# Patient Record
Sex: Female | Born: 1950
Health system: Southern US, Community
[De-identification: ages and names within clinical notes are randomized; demographics above are authoritative.]

## PROBLEM LIST (undated history)

## (undated) DIAGNOSIS — R7989 Other specified abnormal findings of blood chemistry: Secondary | ICD-10-CM

## (undated) DIAGNOSIS — H5200 Hypermetropia, unspecified eye: Secondary | ICD-10-CM

## (undated) DIAGNOSIS — F419 Anxiety disorder, unspecified: Secondary | ICD-10-CM

## (undated) DIAGNOSIS — N390 Urinary tract infection, site not specified: Secondary | ICD-10-CM

## (undated) HISTORY — DX: Other specified abnormal findings of blood chemistry: R79.89

## (undated) HISTORY — DX: Hypermetropia, unspecified eye: H52.00

## (undated) HISTORY — PX: PARTIAL HYSTERECTOMY: SHX80

## (undated) HISTORY — PX: WISDOM TOOTH EXTRACTION: SHX21

## (undated) HISTORY — PX: CARPAL TUNNEL RELEASE: SHX101

## (undated) HISTORY — PX: BREAST REDUCTION SURGERY: SHX8

## (undated) HISTORY — DX: Urinary tract infection, site not specified: N39.0

## (undated) NOTE — *Deleted (*Deleted)
Patient ID: NOVALI VOLLMAN, female   DOB: [birth year], 61 y.o.   MRN: [id number]   This NP visited patient at the bedside as a follow up for palliativ emedicn needs and emotional support  Spoke to husband on telephione for continued conversation regarding     anticiaptroy care needs.        Discussed with patient the importance of continued conversation with family and their  medical providers regarding overall plan of care and treatment options,  ensuring decisions are within the context of the patients values and GOCs.  Questions and concerns addressed   Discussed with Dr Riley Kill and Aria/LCSW vis secure chat  Total time spent on the unit was   Greater than 50% of the time was spent in counseling and coordination of care  Lorinda Creed NP  Palliative Medicine Team Team Phone # [phone] Pager [phone]

---

## 1997-12-07 ENCOUNTER — Other Ambulatory Visit: Admission: RE | Admit: 1997-12-07 | Discharge: 1997-12-07 | Payer: Self-pay | Admitting: Plastic Surgery

## 2001-08-20 ENCOUNTER — Ambulatory Visit (HOSPITAL_BASED_OUTPATIENT_CLINIC_OR_DEPARTMENT_OTHER): Admission: RE | Admit: 2001-08-20 | Discharge: 2001-08-20 | Payer: Self-pay | Admitting: Plastic Surgery

## 2002-08-19 ENCOUNTER — Other Ambulatory Visit: Admission: RE | Admit: 2002-08-19 | Discharge: 2002-08-19 | Payer: Self-pay | Admitting: Gynecology

## 2004-05-09 ENCOUNTER — Other Ambulatory Visit: Admission: RE | Admit: 2004-05-09 | Discharge: 2004-05-09 | Payer: Self-pay | Admitting: Gynecology

## 2005-05-15 ENCOUNTER — Other Ambulatory Visit: Admission: RE | Admit: 2005-05-15 | Discharge: 2005-05-15 | Payer: Self-pay | Admitting: Gynecology

## 2006-09-10 ENCOUNTER — Other Ambulatory Visit: Admission: RE | Admit: 2006-09-10 | Discharge: 2006-09-10 | Payer: Self-pay | Admitting: Gynecology

## 2011-03-25 ENCOUNTER — Emergency Department (HOSPITAL_COMMUNITY): Payer: 59

## 2011-03-25 ENCOUNTER — Emergency Department (HOSPITAL_COMMUNITY)
Admission: EM | Admit: 2011-03-25 | Discharge: 2011-03-25 | Disposition: A | Discharge status: Home / Self Care | Payer: 59 | Attending: Emergency Medicine | Admitting: Emergency Medicine

## 2011-03-25 ENCOUNTER — Encounter (HOSPITAL_COMMUNITY): Payer: Self-pay | Admitting: *Deleted

## 2011-03-25 DIAGNOSIS — W010XXA Fall on same level from slipping, tripping and stumbling without subsequent striking against object, initial encounter: Secondary | ICD-10-CM | POA: Insufficient documentation

## 2011-03-25 DIAGNOSIS — R071 Chest pain on breathing: Secondary | ICD-10-CM | POA: Insufficient documentation

## 2011-03-25 DIAGNOSIS — R259 Unspecified abnormal involuntary movements: Secondary | ICD-10-CM | POA: Insufficient documentation

## 2011-03-25 DIAGNOSIS — S2239XA Fracture of one rib, unspecified side, initial encounter for closed fracture: Secondary | ICD-10-CM | POA: Insufficient documentation

## 2011-03-25 DIAGNOSIS — N390 Urinary tract infection, site not specified: Secondary | ICD-10-CM

## 2011-03-25 DIAGNOSIS — M25559 Pain in unspecified hip: Secondary | ICD-10-CM | POA: Insufficient documentation

## 2011-03-25 DIAGNOSIS — R109 Unspecified abdominal pain: Secondary | ICD-10-CM | POA: Insufficient documentation

## 2011-03-25 DIAGNOSIS — M545 Low back pain, unspecified: Secondary | ICD-10-CM | POA: Insufficient documentation

## 2011-03-25 DIAGNOSIS — S301XXA Contusion of abdominal wall, initial encounter: Secondary | ICD-10-CM | POA: Insufficient documentation

## 2011-03-25 DIAGNOSIS — Y93K9 Activity, other involving animal care: Secondary | ICD-10-CM | POA: Insufficient documentation

## 2011-03-25 LAB — URINALYSIS, ROUTINE W REFLEX MICROSCOPIC
Bilirubin Urine: NEGATIVE
Nitrite: NEGATIVE
Specific Gravity, Urine: 1.023 (ref 1.005–1.030)
Urobilinogen, UA: 0.2 mg/dL (ref 0.0–1.0)
pH: 6 (ref 5.0–8.0)

## 2011-03-25 LAB — CBC
HCT: 41.6 % (ref 36.0–46.0)
MCH: 33 pg (ref 26.0–34.0)
MCV: 93.5 fL (ref 78.0–100.0)
Platelets: 248 10*3/uL (ref 150–400)
RBC: 4.45 MIL/uL (ref 3.87–5.11)
RDW: 13 % (ref 11.5–15.5)
WBC: 10.1 10*3/uL (ref 4.0–10.5)

## 2011-03-25 LAB — URINE MICROSCOPIC-ADD ON

## 2011-03-25 MED ORDER — LORAZEPAM 1 MG PO TABS
1.0000 mg | ORAL_TABLET | Freq: Once | ORAL | Status: AC
  Administered 2011-03-25: 1 mg via ORAL

## 2011-03-25 MED ORDER — HYDROCODONE-ACETAMINOPHEN 5-500 MG PO TABS: 1.0000 | ORAL_TABLET | Freq: Four times a day (QID) | ORAL | Status: AC | PRN

## 2011-03-25 MED ORDER — LORAZEPAM 1 MG PO TABS
ORAL_TABLET | ORAL | Status: DC
Start: 2011-03-25 — End: 2011-03-25
  Filled 2011-03-25: qty 1

## 2011-03-25 MED ORDER — LORAZEPAM 1 MG PO TABS: 1.0000 mg | ORAL_TABLET | Freq: Once | ORAL | Status: DC

## 2011-03-25 MED ORDER — CIPROFLOXACIN HCL 500 MG PO TABS: 500.0000 mg | ORAL_TABLET | Freq: Two times a day (BID) | ORAL | Status: AC

## 2011-03-25 MED ORDER — NAPROXEN 500 MG PO TABS: 500.0000 mg | ORAL_TABLET | Freq: Two times a day (BID) | ORAL | Status: DC

## 2011-03-25 NOTE — ED Notes (Signed)
Discharge instructions given to pt and explained, escorted to discharge window.

## 2011-03-25 NOTE — ED Provider Notes (Signed)
History     CSN: [id number]  Arrival date & time 03/25/11  0543   First MD Initiated Contact with Patient 03/25/11 [phone]      Chief Complaint  Patient presents with  . Fall  . Hip Pain    (Consider location/radiation/quality/duration/timing/severity/associated sxs/prior treatment) HPI Comments: 61 y/o female with hx of no sig PMH - presents with a complaint of right flank. She states that she fell several days ago when she tripped over her dog landing on her right side including the hip and ribs, noticed yesterday starting to have pain in her right lower back and flank.  The pain is constant, worse with palpation and movement, not associated with shortness of breath cough or fever. There is some bruising which has formed on her right flank area. She denies using any anticoagulant therapy.  The history is provided by the patient and the spouse.    History reviewed. No pertinent past medical history.  Past Surgical History  Procedure Date  . Abdominal hysterectomy   . Carpal tunnel release   . Breast reduction surgery     History reviewed. No pertinent family history.  History  Substance Use Topics  . Smoking status: Never Smoker   . Smokeless tobacco: Not on file  . Alcohol Use: Yes     daily    OB History    Grav Para Term Preterm Abortions TAB SAB Ect Mult Living                  Review of Systems  All other systems reviewed and are negative.    Allergies  Review of patient's allergies indicates no known allergies.  Home Medications   Current Outpatient Rx  Name Route Sig Dispense Refill  . CIPROFLOXACIN HCL 500 MG PO TABS Oral Take 1 tablet (500 mg total) by mouth 2 (two) times daily. 14 tablet 0  . HYDROCODONE-ACETAMINOPHEN 5-500 MG PO TABS Oral Take 1-2 tablets by mouth every 6 (six) hours as needed for pain. 15 tablet 0  . NAPROXEN 500 MG PO TABS Oral Take 1 tablet (500 mg total) by mouth 2 (two) times daily with a meal. 30 tablet 0    BP 126/73   Pulse 83  Temp(Src) 99.4 F (37.4 C) (Oral)  Resp 16  Ht 5\' 8"  (1.727 m)  Wt 150 lb (68.04 kg)  BMI 22.81 kg/m2  SpO2 94%  Physical Exam  Nursing note and vitals reviewed. Constitutional: She appears well-developed and well-nourished. No distress.  HENT:  Head: Normocephalic and atraumatic.  Mouth/Throat: Oropharynx is clear and moist. No oropharyngeal exudate.  Eyes: Conjunctivae and EOM are normal. Pupils are equal, round, and reactive to light. Right eye exhibits no discharge. Left eye exhibits no discharge. No scleral icterus.  Neck: Normal range of motion. Neck supple. No JVD present. No thyromegaly present.  Cardiovascular: Normal rate, regular rhythm, normal heart sounds and intact distal pulses.  Exam reveals no gallop and no friction rub.   No murmur heard. Pulmonary/Chest: Effort normal and breath sounds normal. No respiratory distress. She has no wheezes. She has no rales. She exhibits tenderness ( Tenderness to the right chest wall, no crepitance feel).  Abdominal: Soft. Bowel sounds are normal. She exhibits no distension and no mass. There is no tenderness.       Right CVA tenderness associated with bruising  Musculoskeletal: Normal range of motion. She exhibits no edema and no tenderness.       No pain with range of  motion of the knees hips ankles shoulders elbows or wrist bilaterally, normal strength in the bilateral legs and arms major muscle group  Lymphadenopathy:    She has no cervical adenopathy.  Neurological: She is alert. Coordination normal.       Mild intermittent tremor, no intention tremor, follows commands, clear speech, normal gait  Skin: Skin is warm and dry.       Bruising to the right flank  Psychiatric: She has a normal mood and affect. Her behavior is normal.    ED Course  Procedures (including critical care time)  Labs Reviewed  URINALYSIS, ROUTINE W REFLEX MICROSCOPIC - Abnormal; Notable for the following:    APPearance CLOUDY (*)    Glucose,  UA 100 (*)    Ketones, ur TRACE (*)    Leukocytes, UA LARGE (*)    All other components within normal limits  URINE MICROSCOPIC-ADD ON - Abnormal; Notable for the following:    Squamous Epithelial / LPF FEW (*)    Bacteria, UA MANY (*)    All other components within normal limits  CBC  URINE CULTURE   Dg Ribs Unilateral W/chest Right  03/25/2011  *RADIOLOGY REPORT*  Clinical Data: Status post fall.  Right-sided lower rib pain.  RIGHT RIBS AND CHEST - 3+ VIEW  Comparison: Pelvis radiograph 03/25/2011  Findings: Normal heart, mediastinal, hilar contours.  Trachea is midline.  The lungs are clear. Negative for pneumothorax or pleural effusion.  Question a very subtle lucency in the lateral arc of the right eighth rib.  Cannot exclude a subtle nondisplaced fracture.  This is not definite.  Otherwise, the ribs are unremarkable.  IMPRESSION:  1.  Possible subtle acute nondisplaced fracture of the lateral right eighth rib. 2.  No acute cardiopulmonary disease.  Original Report Authenticated By: Britta Mccreedy, M.D.   Dg Pelvis 1-2 Views  03/25/2011  *RADIOLOGY REPORT*  Clinical Data: Fall.  Right-sided pain.  PELVIS - 1-2 VIEW  Comparison: None.  Findings: Decreased bony mineralization.  No acute fracture identified.  Convex left scoliosis of the visualized lower lumbar spine.  Sacroiliac joints and pubic symphysis are normal.  IMPRESSION: No acute bony abnormality.  If the patient has a right hip pain, consider dedicated right hip radiographs.  Original Report Authenticated By: Britta Mccreedy, M.D.     1. Rib fracture   2. Urinary tract infection       MDM  No bony tenderness other than ribs, x-rays pending, patient declines pain medication. 1 mg of Ativan by mouth given for patient's anxiety and shakiness. She has been ambulatory despite the fall with minimal difficulty for 2 days and denies any hematuria, check CBC, urinalysis for microscopic hematuria, damage  Definitive Fracture  Care:  Definitive fracture care performred for the rib.  This included analgesia in the ED (pt declined), incentive spirometry,which have been provided.  I have counseled the pt on possible complications of the fractures and signs and symptoms which would mandate return for further care.    Ciprofloxacin given for urinary infection by prescription, patient well appearing with normal vital signs on discharge  Discharge Prescriptions include:  #1 ciprofloxacin  #2 Vicodin  #3 Naprosyn  Vida Roller, MD 03/25/11 0730

## 2011-03-25 NOTE — ED Notes (Signed)
Pt fell, injuring R hip. Pt states tripped over the dog. Pt states pain in R hip started on Saturday. Pt is shaky and lightheaded. Pt c/o a lump and large hematoma on R hip. Pt states her R hip "catches" when she tries to stand from a sitting position.

## 2011-03-26 LAB — URINE CULTURE: Culture  Setup Time: 201302171110

## 2011-03-26 NOTE — ED Notes (Signed)
Pt called this am needing medication for nausea . Chart reviewed by PA Lawyer. He ordered phenergan 25mg  1 PO every 6 hours as needed for nausea. Disp 12, no refills. Medication called to [facility] in [place] [phone]

## 2011-08-21 ENCOUNTER — Ambulatory Visit (INDEPENDENT_AMBULATORY_CARE_PROVIDER_SITE_OTHER): Payer: 59 | Admitting: Medical

## 2011-08-21 ENCOUNTER — Telehealth: Payer: Self-pay | Admitting: Family Medicine

## 2011-08-21 ENCOUNTER — Encounter: Payer: Self-pay | Admitting: Medical

## 2011-08-21 VITALS — BP 120/80 | HR 76 | Temp 98.4°F | Resp 16 | Ht 67.0 in | Wt 155.0 lb

## 2011-08-21 DIAGNOSIS — Z23 Encounter for immunization: Secondary | ICD-10-CM

## 2011-08-21 DIAGNOSIS — M79609 Pain in unspecified limb: Secondary | ICD-10-CM

## 2011-08-21 DIAGNOSIS — Z1239 Encounter for other screening for malignant neoplasm of breast: Secondary | ICD-10-CM

## 2011-08-21 DIAGNOSIS — Z0000 Encounter for general adult medical examination without abnormal findings: Secondary | ICD-10-CM

## 2011-08-21 DIAGNOSIS — Z1211 Encounter for screening for malignant neoplasm of colon: Secondary | ICD-10-CM

## 2011-08-21 DIAGNOSIS — D492 Neoplasm of unspecified behavior of bone, soft tissue, and skin: Secondary | ICD-10-CM

## 2011-08-21 DIAGNOSIS — M79672 Pain in left foot: Secondary | ICD-10-CM

## 2011-08-21 LAB — LIPID PANEL
HDL: 127 mg/dL (ref >39)
LDL Cholesterol: 136 mg/dL — ABNORMAL HIGH (ref 0–99)
Total CHOL/HDL Ratio: 2.2 Ratio
Triglycerides: 60 mg/dL (ref <150)

## 2011-08-21 LAB — CBC
HCT: 48.3 % — ABNORMAL HIGH (ref 36.0–46.0)
Hemoglobin: 16.2 g/dL — ABNORMAL HIGH (ref 12.0–15.0)
RBC: 4.9 MIL/uL (ref 3.87–5.11)
RDW: 14.1 % (ref 11.5–15.5)
WBC: 6.9 10*3/uL (ref 4.0–10.5)

## 2011-08-21 LAB — POCT URINALYSIS DIPSTICK
Bilirubin, UA: NEGATIVE
Blood, UA: NEGATIVE
Ketones, UA: NEGATIVE
Protein, UA: NEGATIVE
Spec Grav, UA: <=1.005
pH, UA: 7

## 2011-08-21 LAB — COMPREHENSIVE METABOLIC PANEL
ALT: 13 U/L (ref 0–35)
BUN: 15 mg/dL (ref 6–23)
CO2: 29 mEq/L (ref 19–32)
Calcium: 10 mg/dL (ref 8.4–10.5)
Chloride: 102 mEq/L (ref 96–112)
Creat: 0.71 mg/dL (ref 0.50–1.10)
Glucose, Bld: 97 mg/dL (ref 70–99)
Total Bilirubin: 1 mg/dL (ref 0.3–1.2)

## 2011-08-21 NOTE — Telephone Encounter (Signed)
Patient was made aware of her 2 appointments at her office visit on 08/21/11. CLS She was given copies with all of the appointment information on them. CLS   [facility] [phone] July 23,13 @ 130 pm  Dr. Elnoria Howard [phone] July 24, 13 @ 1015 am

## 2011-08-21 NOTE — Progress Notes (Signed)
Subjective:   HPI  Hannah Padilla is a 61 y.o. female who presents for a complete physical.  She is a new patient today.  Referred by Randie Heinz.   Was seeing Urgent Care prior for acute issues.    Preventative care: Last ophthalmology visit: 15 years ago Last dental visit: 5 years ago Last tetanus booster: 3 years ago Flu vaccine: declines Colonoscopy - never Mammogram 1.5 years ago  Concerned about a bladder infection.  Has had 2 bad ones this past year without typical symptoms.  Not sure why.  She gets bad breath occasionally and this was going on with the prior UTI.   She wonders if they are related.   She notes foot problems.  It is painful to stand on her feet all day.  Feels like she is walking on cotton on the balls of the foot. Getting some numbness in left great toe.    Has mole on shoulder she wants checked.  Has doubled in size in the last 29mo.    Reviewed their medical, surgical, family, social, medication, and allergy history and updated chart as appropriate.  Past Medical History  Diagnosis Date  . Urinary tract infection   . Farsightedness     wears glasses    Past Surgical History  Procedure Date  . Carpal tunnel release   . Breast reduction surgery   . Wisdom tooth extraction   . Partial hysterectomy     still has ovaries    Family History  Problem Relation Age of Onset  . Alzheimer's disease Mother   . Heart disease Neg Hx   . Diabetes Neg Hx   . Cancer Neg Hx   . Stroke Neg Hx   . Hypertension Neg Hx   . Hyperlipidemia Neg Hx     History   Social History  . Marital Status: Married    Spouse Name: N/A    Number of Children: N/A  . Years of Education: N/A   Occupational History  . Not on file.   Social History Main Topics  . Smoking status: Never Smoker   . Smokeless tobacco: Not on file  . Alcohol Use: 8.4 oz/week    7 Glasses of wine, 7 Shots of liquor per week     daily  . Drug Use: No  . Sexually Active: Yes    Birth Control/  Protection: None   Other Topics Concern  . Not on file   Social History Narrative   Married, no children    No current outpatient prescriptions on file prior to visit.    No Known Allergies   Review of Systems Constitutional: -fever, -chills, -sweats, -unexpected weight change, -anorexia, -fatigue Allergy: -sneezing, -itching, -congestion Dermatology: + changing moles, rash, lumps, new worrisome lesions ENT: -runny nose, -ear pain, -sore throat, -hoarseness, -sinus pain, -teeth pain, -tinnitus, -hearing loss, -epistaxis Cardiology:  -chest pain, -palpitations, -edema, -orthopnea, -paroxysmal nocturnal dyspnea Respiratory: -cough, -shortness of breath, -dyspnea on exertion, -wheezing, -hemoptysis Gastroenterology: -abdominal pain, -nausea, -vomiting, -diarrhea, -constipation, -blood in stool, -changes in bowel movement, -dysphagia Hematology: -bleeding or bruising problems Musculoskeletal: -arthralgias, -myalgias, -joint swelling, -back pain, -neck pain, -cramping, -gait changes Ophthalmology: -vision changes, -eye redness, -itching, -discharge Urology: -dysuria, -difficulty urinating, -hematuria, -urinary frequency, -urgency, incontinence Neurology: -headache, -weakness, -tingling, +numbness, -speech abnormality, -memory loss, -falls, -dizziness Psychology:  -depressed mood, -agitation, -sleep problems         Objective:   Physical Exam  Filed Vitals:   08/21/11 0824  BP:  120/80  Pulse: 76  Temp: 98.4 F (36.9 C)  Resp: 16    General appearance: alert, no distress, WD/WN, white female Skin: left anterior shoulder with somewhat trapezoid shaped lesion, 12mm long x 6mm wide, slightly raised, mostly red brown, somewhat asymmetric but borders defined, several other smaller benign lesions of arms, chest, abdomen and back HEENT: normocephalic, conjunctiva/corneas normal, sclerae anicteric, PERRLA, EOMi, nares patent, no discharge or erythema, pharynx normal Oral cavity: MMM,  tongue normal, teeth in good repair Neck: supple, no lymphadenopathy, no thyromegaly, no masses, normal ROM, no bruits Chest: non tender, normal shape and expansion Heart: RRR, normal S1, S2, no murmurs Lungs: CTA bilaterally, no wheezes, rhonchi, or rales Abdomen: +bs, soft, non tender, non distended, no masses, no hepatomegaly, no splenomegaly, no bruits Back: non tender, normal ROM, no scoliosis Musculoskeletal: upper extremities non tender, no obvious deformity, normal ROM throughout, lower extremities non tender, no obvious deformity, normal ROM throughout Extremities: no edema, no cyanosis, no clubbing Pulses: 2+ symmetric, upper and lower extremities, normal cap refill Neurological: alert, oriented x 3, CN2-12 intact, strength normal upper extremities and lower extremities, sensation normal throughout, DTRs 2+ throughout, no cerebellar signs, gait normal Psychiatric: normal affect, behavior normal, pleasant  Breast: nontender, no masses or lumps, no skin changes, no nipple discharge or inversion, no axillary lymphadenopathy Gyn: Normal external genitalia without lesions, vagina with normal mucosa, s/p hysterectomy, mild white creamy vaginal discharge.  Adnexa not enlarged, nontender, no masses.  Exam chaperoned by nurse. Rectal: anus normal appearing, DRE deferred as she will be referred to GI    Assessment and Plan :    Encounter Diagnoses  Name Primary?  . Routine general medical examination at a health care facility Yes  . Screening for colon cancer   . Screening for breast cancer   . Neoplasm of skin   . Need for zoster vaccination   . Pain in both feet     Physical exam - discussed healthy lifestyle, diet, exercise, preventative care, vaccinations, and addressed their concerns.  Handout given.  Referral to Dr. Elnoria Howard for first screening colonoscopy.  Referral for mammogram.  Neoplasm of skin - return soon for punch biopsy of left anterior shoulder lesion  Script for  shingles vaccine today.  Pain in feet - try Dr. Luiz Ochoa orthotics or different footwear.  If this doesn't help, recheck.   Follow-up pending studies.

## 2011-08-21 NOTE — Patient Instructions (Signed)

## 2011-08-22 ENCOUNTER — Other Ambulatory Visit: Payer: Self-pay | Admitting: Medical

## 2011-08-22 LAB — WET PREP, GENITAL: Trich, Wet Prep: NONE SEEN

## 2011-08-22 MED ORDER — ERGOCALCIFEROL 1.25 MG (50000 UT) PO CAPS: 50000.0000 [IU] | ORAL_CAPSULE | ORAL | Status: AC

## 2011-08-29 ENCOUNTER — Telehealth: Payer: Self-pay | Admitting: Internal Medicine

## 2011-08-29 NOTE — Telephone Encounter (Signed)
Message copied by Joslyn Hy on Wed Aug 29, 2011  2:25 PM ------      Message from: Aleen Campi, DAVID S      Created: Wed Aug 29, 2011  2:12 PM       Mammogram negative.  Repeat 30yr

## 2011-09-03 ENCOUNTER — Ambulatory Visit: Payer: 59 | Admitting: Medical

## 2011-09-05 ENCOUNTER — Encounter: Payer: Self-pay | Admitting: Medical

## 2011-09-05 ENCOUNTER — Ambulatory Visit (INDEPENDENT_AMBULATORY_CARE_PROVIDER_SITE_OTHER): Payer: 59 | Admitting: Medical

## 2011-09-05 VITALS — BP 130/80 | HR 75 | Temp 98.2°F | Resp 14 | Wt 153.0 lb

## 2011-09-05 DIAGNOSIS — D492 Neoplasm of unspecified behavior of bone, soft tissue, and skin: Secondary | ICD-10-CM

## 2011-09-05 HISTORY — PX: OTHER SURGICAL HISTORY: SHX169

## 2011-09-05 NOTE — Progress Notes (Signed)
Subjective: Here for skin lesion excision only.  We had discussed this at her recently physical here.  She notes the left shoulder lesion has doubled in size in the last 26mo.  Otherwise no c/o.  She wishes to proceed with excision instead of punch biopsy.    Objective: Gen: wd, wn, nad Skin: left anterior shoulder region, medial to head of humerus, overlying lateral portion of supraspinatus with a somewhat rectangular lesion, 12mm long x 6mm wide, slightly raised, mostly red brown, somewhat asymmetric but borders defined  Assessment: Encounter Diagnosis  Name Primary?  . Neoplasm of skin of shoulder Yes   Plan: discussed risk/benefits of procedure, she gave consent, and we prepped for excisional biopsy.  Used 3 cc of 1% lidocaine with epi, cleaned and draped in normal sterile fashion with patient in supine position, used #15 blade to make an elliptical incision along lines of langerhans.  Completely excised the lesion and sent to pathology.  During this time I actually felt very lightheaded which is unusual for me as this was a routine biopsy.  i had nurse get supervising physician, Dr. Lynelle Doctor, who came in and completed the wound closure.  I left the room after Dr. Lynelle Doctor had come in and started closing the wound.  The wound was closed with 5 individual simple interrupted sutures, 5.0 nylon.  EBL <3cc.  Hemostasis achieved. Pt tolerated procedure well.  Covered wound with clean sterile dressing.  After I felt improved after an apparent vasovagal response, I came back in the room, apologized for the unusual circumstance, and discussed wound care and f/u with pt.  Pt was very understanding.  F/u in 10 days for suture removal.

## 2011-09-05 NOTE — Patient Instructions (Signed)
Keep the wound clean and dry.  Don't let it get wet for the next 24 hours at least.  After 24 hours, keep the area clean and dry.  You can use mild soap and water for cleaning around the wound, but then cover with clean dressing.  Take care to keep the area clean and return in 10 days to remove sutures.

## 2011-09-06 ENCOUNTER — Encounter: Payer: Self-pay | Admitting: Medical

## 2011-09-10 ENCOUNTER — Encounter: Payer: Self-pay | Admitting: Internal Medicine

## 2011-09-11 ENCOUNTER — Encounter: Payer: Self-pay | Admitting: Medical

## 2011-09-14 ENCOUNTER — Ambulatory Visit (INDEPENDENT_AMBULATORY_CARE_PROVIDER_SITE_OTHER): Payer: 59 | Admitting: Medical

## 2011-09-14 ENCOUNTER — Encounter: Payer: Self-pay | Admitting: Medical

## 2011-09-14 DIAGNOSIS — L821 Other seborrheic keratosis: Secondary | ICD-10-CM

## 2011-09-14 DIAGNOSIS — L851 Acquired keratosis [keratoderma] palmaris et plantaris: Secondary | ICD-10-CM

## 2011-09-14 DIAGNOSIS — Z4802 Encounter for removal of sutures: Secondary | ICD-10-CM

## 2011-09-14 NOTE — Progress Notes (Signed)
Subjective:  Here for suture removal.  I saw her 10 days ago to remove lesion from left shoulder region.   She has had some itching at the wound, but no warmth, drainage, fever, nausea or pain.  No c/o, doing well.  Objective: Left anterior medial shoulder with horizontal healing wound with pink granulation tissue and 5 Prolene interrupted sutures.  No warmth, drainage, or fluctuance.   Assessment: Encounter Diagnoses  Name Primary?  . Visit for suture removal Yes  . Lichenoid keratosis    Plan: Discussed pathology report, the benign nature of this lesion that was totally excised, and removed all 5 interrupted sutures in usual sterile fashion.  Placed 3 steri strips.  Discussed wound care.  F/u prn.

## 2011-11-26 ENCOUNTER — Ambulatory Visit (INDEPENDENT_AMBULATORY_CARE_PROVIDER_SITE_OTHER): Payer: 59 | Admitting: Medical

## 2011-11-26 ENCOUNTER — Encounter: Payer: Self-pay | Admitting: Medical

## 2011-11-26 VITALS — BP 110/82 | HR 110 | Temp 98.5°F | Resp 18 | Wt 152.0 lb

## 2011-11-26 DIAGNOSIS — K921 Melena: Secondary | ICD-10-CM

## 2011-11-26 DIAGNOSIS — R195 Other fecal abnormalities: Secondary | ICD-10-CM

## 2011-11-26 DIAGNOSIS — R450 Nervousness: Secondary | ICD-10-CM

## 2011-11-26 DIAGNOSIS — R110 Nausea: Secondary | ICD-10-CM

## 2011-11-26 DIAGNOSIS — R109 Unspecified abdominal pain: Secondary | ICD-10-CM

## 2011-11-26 DIAGNOSIS — R197 Diarrhea, unspecified: Secondary | ICD-10-CM

## 2011-11-26 LAB — CBC WITH DIFFERENTIAL/PLATELET
Basophils Absolute: 0.1 10*3/uL (ref 0.0–0.1)
HCT: 48.3 % — ABNORMAL HIGH (ref 36.0–46.0)
Hemoglobin: 17 g/dL — ABNORMAL HIGH (ref 12.0–15.0)
Lymphocytes Relative: 16 % (ref 12–46)
Lymphs Abs: 1.6 10*3/uL (ref 0.7–4.0)
Monocytes Absolute: 0.3 10*3/uL (ref 0.1–1.0)
Monocytes Relative: 3 % (ref 3–12)
Neutro Abs: 8 10*3/uL — ABNORMAL HIGH (ref 1.7–7.7)
RBC: 5.18 MIL/uL — ABNORMAL HIGH (ref 3.87–5.11)
WBC: 10 10*3/uL (ref 4.0–10.5)

## 2011-11-26 LAB — POCT URINALYSIS DIPSTICK
Glucose, UA: NEGATIVE
Ketones, UA: NEGATIVE
Protein, UA: NEGATIVE
Urobilinogen, UA: NEGATIVE

## 2011-11-26 LAB — TSH: TSH: 2.313 u[IU]/mL (ref 0.350–4.500)

## 2011-11-26 LAB — POCT HEMOGLOBIN: Hemoglobin: 17.1 g/dL — AB (ref 12.2–16.2)

## 2011-11-26 LAB — T4, FREE: Free T4: 1.5 ng/dL (ref 0.80–1.80)

## 2011-11-26 LAB — HEPATIC FUNCTION PANEL
Albumin: 4.7 g/dL (ref 3.5–5.2)
Total Bilirubin: 1.1 mg/dL (ref 0.3–1.2)

## 2011-11-26 LAB — BASIC METABOLIC PANEL
BUN: 15 mg/dL (ref 6–23)
Creat: 0.8 mg/dL (ref 0.50–1.10)
Potassium: 3.7 mEq/L (ref 3.5–5.3)

## 2011-11-26 NOTE — Progress Notes (Signed)
Subjective: Here for c/o episodes of nausea, nervousness, shakes, that started yesterday.  All last week she had diarrhea but this calmed down a bit, then over the weekend had some black tarry stool.  This morning was back to diarrhea.  She did use some Pepto Bismol over the weekend.  She had several of these episodes yesterday, and she would drink orange juice or V8 which seemed to calm her down.  This morning nothing seemed to calm down these symptoms.  Been having the nervousness, shakes all morning without relief.  Came in today as a walk in.  When she gets the symptoms gets a little short of breath.  She occasionally uses Ibuprofen, but not daily.  She denies hematemesis.  She notes hx/o gastric ulcers as a child.  Denies chest pain, edema, palpitations.  Just feels jittery and shaky in general.  denies any recent stressors that has caused anxiety or panic feeling.  No prior hx/o heart issues or GI bleed.   Past Medical History  Diagnosis Date  . Urinary tract infection   . Farsightedness     wears glasses   ROS as in HPI  Objective: Gen: anxious appearing, somewhat pale appearing, but no jaundice.   Skin: decent turgor.   HEENT: negative Oral: MMM, no lesions Neck: supple, no thyromegaly, no mass, no JVD Heart: RRR, somewhat pounding heart rate, but no murmur, normal S1, S2 Lungs: CTA GI: +decreased bs, soft, nontender, no mass or organomegaly Pulses: normal UE and LE Ext: no edema Rectal: small external hemorrhoids, no mass or lesion otherwise, occult negative stool, exam chaperoned by nurse    Adult ECG Report  Indication: jittery, SOB  Rate: 88bpm  Rhythm: normal sinus rhythm  QRS Axis: 69 degrees  PR Interval:  QRS Duration: 90ms  QTc:  Conduction Disturbances: none  Other Abnormalities: possible left atrial enlargement  Patient's cardiac risk factors are: none.  EKG comparison: none  Narrative Interpretation: some distorted baseline given patients  jitteriness, possible left atrial enlargement, but otherwise normal EKG  Assessment: Encounter Diagnoses  Name Primary?  . Diarrhea Yes  . Nausea   . Black stool   . Abdominal cramping   . Jittery    Reviewed EKG.  Hemoglobin 17.1g/dl.  +orthostatic hypotensive drop.  Occult negative stool.  Urinalysis unremarkable.   Sent stat labs for further evaluation.  For now advised rest, increase hydration with water and lytes, eat bland foods. Note for work.  discussed case with supervising physician Dr. Susann Givens and we will await lab results.

## 2011-11-27 ENCOUNTER — Other Ambulatory Visit: Payer: Self-pay | Admitting: Medical

## 2011-11-28 ENCOUNTER — Other Ambulatory Visit: Payer: 59

## 2011-11-29 LAB — SEDIMENTATION RATE: Sed Rate: 1 mm/hr (ref 0–22)

## 2011-11-29 LAB — PTH, INTACT AND CALCIUM: Calcium, Total (PTH): 9.8 mg/dL (ref 8.4–10.5)

## 2011-12-11 ENCOUNTER — Ambulatory Visit (INDEPENDENT_AMBULATORY_CARE_PROVIDER_SITE_OTHER): Payer: 59 | Admitting: Medical

## 2011-12-11 ENCOUNTER — Encounter: Payer: Self-pay | Admitting: Medical

## 2011-12-11 VITALS — BP 120/80 | HR 83 | Temp 98.4°F | Resp 18 | Wt 150.0 lb

## 2011-12-11 DIAGNOSIS — F411 Generalized anxiety disorder: Secondary | ICD-10-CM

## 2011-12-11 DIAGNOSIS — F419 Anxiety disorder, unspecified: Secondary | ICD-10-CM

## 2011-12-11 DIAGNOSIS — F4010 Social phobia, unspecified: Secondary | ICD-10-CM

## 2011-12-11 DIAGNOSIS — F40248 Other situational type phobia: Secondary | ICD-10-CM

## 2011-12-11 DIAGNOSIS — R110 Nausea: Secondary | ICD-10-CM

## 2011-12-11 DIAGNOSIS — R195 Other fecal abnormalities: Secondary | ICD-10-CM

## 2011-12-11 MED ORDER — ONDANSETRON HCL 4 MG PO TABS: 4.0000 mg | ORAL_TABLET | Freq: Three times a day (TID) | ORAL | Status: DC | PRN

## 2011-12-11 NOTE — Progress Notes (Signed)
Subjective: Here for recheck on "shakes."  After last visit, she notes by 11 am felt fine.  Her husband told me over the phone that she seems to get these episodes on Mondays.  She notes that she gets these episodes if she knows she is going to a party or event.   She will get shakes, nausea.   She is terrified of public speaking .  She doesn't want to be at the center of attention.  Took an F in an [place] class in high school because she refused to do an oral book report.  She doesn't necessarily have a hard time in big crowds.  Overall life is ok.   These episodes happen occasionally. She does note 2 panic attacks in her whole life, had shortness of breath, had to get away by herself.   She has never had evaluation for psychiatry or neurologic issue.  No prior treatment for anxiety or other mental health concerns.  She notes that work environment is good, enjoys working at [employer].  Home life is good, relationship with husband is good.  She denies any abuse or harm.  Financial situation is stable and better than in the past.  Denies depression.  Sleep is ok, usually gets 6 hours of sleep.  She has had no more dark stools that she saw last visit.  Overall doing ok.   Past Medical History  Diagnosis Date  . Urinary tract infection   . Farsightedness     wears glasses   ROS as in HPI  Objective: Gen: wd, wn, nad Skin: unremarkable  Neck: supple, no thyromegaly, no mass, no JVD Heart: RRR, somewhat pounding heart rate, but no murmur, normal S1, S2 Lungs: CTA Pulses: normal UE and LE Ext: no edema Neuro: CN2-12 intact, normal strength, sensation, DTRs, Romberg negative, heel to toe normal, finger to nose normal, no cerebellar signs.  Assessment: Encounter Diagnoses  Name Primary?  Marland Kitchen Anxiety Yes  . Nausea   . Fear of public speaking   . Dark stools    Plan: Discussed her symptoms, recent labs, and after careful history, it seems as though her symptoms of shakes and nausea seem more  anxiety related than anything else.   Discussed diagnosis of anxiety, ways to deal with anxiety, possible medications if needed.  Discussed deep breathing and relaxation.   She will use either Zofran or OTC anti nausea medication or Benadryl for episodes of nausea or possibly the shakes.   If symptoms worsen or get more frequent, she will let me know.  Based on eval so far, no major concern for underlying neurological issue.   Dark stools have resolved.  No major concern for GI bleed.

## 2011-12-11 NOTE — Patient Instructions (Signed)

## 2012-02-07 ENCOUNTER — Telehealth: Payer: Self-pay | Admitting: Medical

## 2012-02-07 NOTE — Telephone Encounter (Signed)
PT INFORMED TO TAKE 2 PILLS TO SEE IF THAT WOULD HELP AND CALL BACK TOMORROW TO LET SHANE KNOW IF THIS WORKED OR NOT

## 2012-02-07 NOTE — Telephone Encounter (Signed)
Have her double the dose of Zofran and call tomorrow

## 2012-02-07 NOTE — Telephone Encounter (Signed)
Pt called and stated that she saw shane and was given a rx for nausea. She was having issues with nausea so she took it today and it didn't help. She is requesting something else be called in for her. She took it at 70 and has had no relief. Pt uses [facility]. Please inform pt of what she needs now.

## 2012-02-08 NOTE — Telephone Encounter (Signed)
02/08/2012 °

## 2012-05-25 ENCOUNTER — Other Ambulatory Visit: Payer: Self-pay | Admitting: Medical

## 2012-05-26 NOTE — Telephone Encounter (Signed)
Is this okay to refill? 

## 2012-07-26 ENCOUNTER — Other Ambulatory Visit: Payer: Self-pay | Admitting: Medical

## 2013-02-05 DIAGNOSIS — K635 Polyp of colon: Secondary | ICD-10-CM

## 2013-02-05 HISTORY — DX: Polyp of colon: K63.5

## 2013-02-05 HISTORY — PX: COLONOSCOPY: SHX174

## 2013-05-13 ENCOUNTER — Ambulatory Visit (INDEPENDENT_AMBULATORY_CARE_PROVIDER_SITE_OTHER): Payer: 59 | Admitting: Medical

## 2013-05-13 ENCOUNTER — Telehealth: Payer: Self-pay | Admitting: Medical

## 2013-05-13 ENCOUNTER — Encounter: Payer: Self-pay | Admitting: Medical

## 2013-05-13 VITALS — BP 120/82 | HR 72 | Temp 98.1°F | Resp 16 | Wt 155.0 lb

## 2013-05-13 DIAGNOSIS — Z8719 Personal history of other diseases of the digestive system: Secondary | ICD-10-CM

## 2013-05-13 DIAGNOSIS — R1013 Epigastric pain: Secondary | ICD-10-CM

## 2013-05-13 DIAGNOSIS — F1010 Alcohol abuse, uncomplicated: Secondary | ICD-10-CM

## 2013-05-13 LAB — CBC
HEMATOCRIT: 46.8 % — AB (ref 36.0–46.0)
Hemoglobin: 16.3 g/dL — ABNORMAL HIGH (ref 12.0–15.0)
MCH: 32 pg (ref 26.0–34.0)
MCHC: 34.8 g/dL (ref 30.0–36.0)
MCV: 91.8 fL (ref 78.0–100.0)
PLATELETS: 280 10*3/uL (ref 150–400)
RBC: 5.1 MIL/uL (ref 3.87–5.11)
RDW: 14 % (ref 11.5–15.5)
WBC: 6.9 10*3/uL (ref 4.0–10.5)

## 2013-05-13 LAB — BASIC METABOLIC PANEL
BUN: 12 mg/dL (ref 6–23)
CHLORIDE: 104 meq/L (ref 96–112)
CO2: 25 mEq/L (ref 19–32)
CREATININE: 0.58 mg/dL (ref 0.50–1.10)
Calcium: 10.2 mg/dL (ref 8.4–10.5)
GLUCOSE: 83 mg/dL (ref 70–99)
POTASSIUM: 4.1 meq/L (ref 3.5–5.3)
Sodium: 139 mEq/L (ref 135–145)

## 2013-05-13 LAB — GAMMA GT: GGT: 18 U/L (ref 7–51)

## 2013-05-13 LAB — HEPATIC FUNCTION PANEL
ALBUMIN: 4.5 g/dL (ref 3.5–5.2)
ALT: 12 U/L (ref 0–35)
AST: 15 U/L (ref 0–37)
Alkaline Phosphatase: 95 U/L (ref 39–117)
BILIRUBIN DIRECT: 0.2 mg/dL (ref 0.0–0.3)
BILIRUBIN TOTAL: 0.8 mg/dL (ref 0.2–1.2)
Indirect Bilirubin: 0.6 mg/dL (ref 0.2–1.2)
Total Protein: 6.9 g/dL (ref 6.0–8.3)

## 2013-05-13 LAB — LIPASE: Lipase: 17 U/L (ref 0–75)

## 2013-05-13 NOTE — Patient Instructions (Signed)
Thank you for giving me the opportunity to serve you today.    Your diagnosis today includes: Encounter Diagnoses  Name Primary?  . Epigastric abdominal pain Yes  . Excessive drinking alcohol   . History of gastroesophageal reflux (GERD)      Specific recommendations today include:  Continue Nexium 1 capsule in the morning 30-45 minutes before breakfast  For now, avoid acidic foods, citrus foods, spicy foods   I would recommend cutting out alcohol  Avoid ibuprofen, Aleve, aspirin or other anti-inflammatory  Eat more bland foods for now  We will call you back with lab results and referral for endoscopy  Return pending labs.    I have included other useful information below for your review.  Peptic Ulcer A peptic ulcer is a sore in the lining of in your esophagus (esophageal ulcer), stomach (gastric ulcer), or in the first part of your small intestine (duodenal ulcer). The ulcer causes erosion into the deeper tissue. CAUSES  Normally, the lining of the stomach and the small intestine protects itself from the acid that digests food. The protective lining can be damaged by:  An infection caused by a bacterium called Helicobacter pylori (H. pylori).  Regular use of nonsteroidal anti-inflammatory drugs (NSAIDs), such as ibuprofen or aspirin.  Smoking tobacco. Other risk factors include being older than 25, drinking alcohol excessively, and having a family history of ulcer disease.  SYMPTOMS   Burning pain or gnawing in the area between the chest and the belly button.  Heartburn.  Nausea and vomiting.  Bloating. The pain can be worse on an empty stomach and at night. If the ulcer results in bleeding, it can cause:  Black, tarry stools.  Vomiting of bright red blood.  Vomiting of coffee ground looking materials. DIAGNOSIS  A diagnosis is usually made based upon your history and an exam. Other tests and procedures may be performed to find the cause of the ulcer.  Finding a cause will help determine the best treatment. Tests and procedures may include:  Blood tests, stool tests, or breath tests to check for the bacterium H. pylori.  An upper gastrointestinal (GI) series of the esophagus, stomach, and small intestine.  An endoscopy to examine the esophagus, stomach, and small intestine.  A biopsy. TREATMENT  Treatment may include:  Eliminating the cause of the ulcer, such as smoking, NSAIDs, or alcohol.  Medicines to reduce the amount of acid in your digestive tract.  Antibiotic medicines if the ulcer is caused by the H. pylori bacterium.  An upper endoscopy to treat a bleeding ulcer.  Surgery if the bleeding is severe or if the ulcer created a hole somewhere in the digestive system. HOME CARE INSTRUCTIONS   Avoid tobacco, alcohol, and caffeine. Smoking can increase the acid in the stomach, and continued smoking will impair the healing of ulcers.  Avoid foods and drinks that seem to cause discomfort or aggravate your ulcer.  Only take medicines as directed by your caregiver. Do not substitute over-the-counter medicines for prescription medicines without talking to your caregiver.  Keep any follow-up appointments and tests as directed. SEEK MEDICAL CARE IF:   Your do not improve within 7 days of starting treatment.  You have ongoing indigestion or heartburn. SEEK IMMEDIATE MEDICAL CARE IF:   You have sudden, sharp, or persistent abdominal pain.  You have bloody or dark black, tarry stools.  You vomit blood or vomit that looks like coffee grounds.  You become light headed, weak, or feel faint.  You become sweaty or clammy. MAKE SURE YOU:   Understand these instructions.  Will watch your condition.  Will get help right away if you are not doing well or get worse. Document Released: 01/20/2000 Document Revised: 10/17/2011 Document Reviewed: 08/22/2011 [facility] Patient Information 2014 [place].

## 2013-05-13 NOTE — Telephone Encounter (Signed)
Referred to [facility] in [facility] for endoscopy and colonoscopy.

## 2013-05-13 NOTE — Telephone Encounter (Signed)
Patient is aware of her appointment to [facility] On 05/27/13. CLS

## 2013-05-13 NOTE — Progress Notes (Signed)
PATIENT IS AWARE OF HER APPOINTMENT AT [facility] May 27, 2013 @ 100 AM IN [place], [place]. [place]

## 2013-05-13 NOTE — Progress Notes (Signed)
   Subjective:   Hannah Padilla is a 63 y.o. female presenting on 05/13/2013 with stomach problems  She reports 2 week hx/o stomach pain, can happen out of the blue.  When she gets off work from standing all day, gets pain on the way home from work.  No relation to specific food, no pain immediately after eating.  Pain is upper stomach.  She notes a year ago was drinking "too much," 5-6 drinks per night.  Now drinking 1-2 alcoholic drinks per night, wine.   Sometimes this gives her pain, sometimes not.  Now alcohol is not even tolerated.  Occasionally takes Bayer back and body, but no routine NSAIDs or aspirin.   Gets occasional loose stool.  No constipation, no vomiting.  Occasional nausea+.  No prior EGD or colonoscopy. Pain so bad this morning she went to urgent care was advised of this could be her gallbladder .  She does have some family history of gallbladder disease but no family history of GI cancer or other GI disease.  She does note some clay-colored stool in the past year, one episode of blood in the stool recently.  No other aggravating or relieving factors.  No other complaint.  Review of Systems ROS as in subjective      Objective:     Filed Vitals:   05/13/13 1331  BP: 120/82  Pulse: 72  Temp: 98.1 F (36.7 C)  Resp: 16    General appearance: alert, no distress, WD/WN, white female Oral cavity: MMM, no lesions Neck: supple, no lymphadenopathy, no thyromegaly, no masses Heart: RRR, normal S1, S2, no murmurs Lungs: CTA bilaterally, no wheezes, rhonchi, or rales Abdomen: +bs, soft, +epigastric tenderness, non tender, non distended, no masses, no hepatomegaly, no splenomegaly Back: nontender Pulses: 2+ symmetric, upper and lower extremities, normal cap refill Ext: no edema      Assessment: Encounter Diagnoses  Name Primary?  . Epigastric abdominal pain Yes  . Excessive drinking alcohol   . History of gastroesophageal reflux (GERD)      Plan: Discussed possible  causes of her symptoms which could include GERD, ulcer, H. pylori infection, tumor, alcoholic gastritis.  Advise she try and go ahead and cut out alcohol altogether, she has only been drinking 2 drinks per night of white liquor lately.  Advise she avoid acidic foods, avoid NSAIDs and aspirin, she has already begun Nexium started this morning by urgent care.  Referral to endoscopy and colonoscopy, labs today  Hannah Padilla was seen today for stomach problems.  Diagnoses and associated orders for this visit:  Epigastric abdominal pain - CBC - Lipase - Basic metabolic panel - Hepatic function panel - Gamma GT  Excessive drinking alcohol - CBC - Lipase - Basic metabolic panel - Hepatic function panel - Gamma GT  History of gastroesophageal reflux (GERD) - CBC - Lipase - Basic metabolic panel - Hepatic function panel - Gamma GT     Return pending labs.

## 2013-09-14 LAB — HM MAMMOGRAPHY

## 2013-09-16 ENCOUNTER — Encounter: Payer: Self-pay | Admitting: Internal Medicine

## 2014-03-16 ENCOUNTER — Ambulatory Visit: Payer: Self-pay | Admitting: Medical

## 2014-03-18 ENCOUNTER — Ambulatory Visit: Payer: Self-pay | Admitting: Medical

## 2014-07-27 ENCOUNTER — Ambulatory Visit (INDEPENDENT_AMBULATORY_CARE_PROVIDER_SITE_OTHER): Payer: Self-pay | Admitting: Medical

## 2014-07-27 ENCOUNTER — Encounter: Payer: Self-pay | Admitting: Medical

## 2014-07-27 VITALS — BP 110/80 | HR 82 | Temp 98.2°F | Wt 167.0 lb

## 2014-07-27 DIAGNOSIS — L989 Disorder of the skin and subcutaneous tissue, unspecified: Secondary | ICD-10-CM

## 2014-07-27 NOTE — Progress Notes (Signed)
Subjective: Here for changing mole.   Has had skin lesion of left medial lower leg for years about the size of eraser, but then in last few months it has doubled in size, is irregular border, and has darker center area.  Worried about skin cancer.  No other aggravating or relieving factors. No other complaint.  Objective: BP 110/80 mmHg  Pulse 82  Temp(Src) 98.2 F (36.8 C) (Oral)  Wt 167 lb (75.751 kg)  Gen: wd, wn, nad Skin: left lower leg medially mid shaft with 74mm diameter brown flat lesion with irregular border, centrally there is a 92mm darker brown round area  Assessment: Encounter Diagnosis  Name Primary?  . Changing skin lesion Yes     Plan: Discussed findings.   She wishes to proceed with shave biopsy.  discussed risks/benefits of procedure.  Cleaned and prepped in usual sterile fashion.  Used 1% lidocaine with epi for local anesthesia of left lower leg medial lesion.  Used shave biopsy to remove lesion in entirety.   Used direct pressure and hyphercator for hemostasis.  hyphercated base.  EBL < 39ml.  Covered with sterile bandage.   Patient tolerated procedure well.  Discussed wound care.  Tissue sent for pathology.  F/u pending pathology

## 2018-07-08 ENCOUNTER — Ambulatory Visit (INDEPENDENT_AMBULATORY_CARE_PROVIDER_SITE_OTHER): Payer: Medicare Other | Admitting: Medical

## 2018-07-08 ENCOUNTER — Encounter: Payer: Self-pay | Admitting: Medical

## 2018-07-08 ENCOUNTER — Other Ambulatory Visit: Payer: Self-pay

## 2018-07-08 VITALS — BP 138/86 | HR 80 | Temp 99.5°F | Ht 67.0 in | Wt 169.4 lb

## 2018-07-08 DIAGNOSIS — K219 Gastro-esophageal reflux disease without esophagitis: Secondary | ICD-10-CM | POA: Insufficient documentation

## 2018-07-08 DIAGNOSIS — Z1211 Encounter for screening for malignant neoplasm of colon: Secondary | ICD-10-CM

## 2018-07-08 DIAGNOSIS — Z23 Encounter for immunization: Secondary | ICD-10-CM | POA: Diagnosis not present

## 2018-07-08 DIAGNOSIS — Z1239 Encounter for other screening for malignant neoplasm of breast: Secondary | ICD-10-CM

## 2018-07-08 DIAGNOSIS — T753XXA Motion sickness, initial encounter: Secondary | ICD-10-CM

## 2018-07-08 DIAGNOSIS — R110 Nausea: Secondary | ICD-10-CM | POA: Diagnosis not present

## 2018-07-08 DIAGNOSIS — N898 Other specified noninflammatory disorders of vagina: Secondary | ICD-10-CM

## 2018-07-08 DIAGNOSIS — Z136 Encounter for screening for cardiovascular disorders: Secondary | ICD-10-CM | POA: Diagnosis not present

## 2018-07-08 DIAGNOSIS — Z0000 Encounter for general adult medical examination without abnormal findings: Secondary | ICD-10-CM | POA: Diagnosis not present

## 2018-07-08 DIAGNOSIS — Z124 Encounter for screening for malignant neoplasm of cervix: Secondary | ICD-10-CM | POA: Insufficient documentation

## 2018-07-08 DIAGNOSIS — Z1159 Encounter for screening for other viral diseases: Secondary | ICD-10-CM | POA: Insufficient documentation

## 2018-07-08 DIAGNOSIS — N939 Abnormal uterine and vaginal bleeding, unspecified: Secondary | ICD-10-CM

## 2018-07-08 DIAGNOSIS — Z7189 Other specified counseling: Secondary | ICD-10-CM | POA: Insufficient documentation

## 2018-07-08 DIAGNOSIS — E2839 Other primary ovarian failure: Secondary | ICD-10-CM | POA: Insufficient documentation

## 2018-07-08 DIAGNOSIS — Z780 Asymptomatic menopausal state: Secondary | ICD-10-CM | POA: Diagnosis not present

## 2018-07-08 DIAGNOSIS — Z7185 Encounter for immunization safety counseling: Secondary | ICD-10-CM | POA: Insufficient documentation

## 2018-07-08 NOTE — Progress Notes (Signed)
Subjective:    Hannah Padilla is a 68 y.o. female who presents for Preventative Services visit and chronic medical problems/med check visit.    Primary Care Provider Tysinger, Camelia Eng, PA-C here for primary care  Current Health Care Team:  Dentist, Dr. Henreitta Leber   Eye doctor, [facility]  Dr. Phineas Real, OB/Gyn  Medical Services you may have received from other than [facility] providers in the past year (date may be approximate) none  Exercise Current exercise habits: none  Nutrition/Diet Current diet: no particular diet.    Depression Screen Depression screen [facility] 2/9 07/08/2018  Decreased Interest 0  Down, Depressed, Hopeless 0  PHQ - 2 Score 0    Activities of Daily Living Screen/Functional Status Survey Is the patient deaf or have difficulty hearing?: No Does the patient have difficulty seeing, even when wearing glasses/contacts?: No Does the patient have difficulty concentrating, remembering, or making decisions?: No Does the patient have difficulty walking or climbing stairs?: No Does the patient have difficulty dressing or bathing?: No Does the patient have difficulty doing errands alone such as visiting a doctor's office or shopping?: No  Can patient draw a clock face showing 3:15 o'clock, yes Fall Risk Screen Fall Risk  07/08/2018  Falls in the past year? 0    Gait Assessment: Normal gait observed: yes  Advanced directives Does patient have a [place]? no Does patient have a Living Will? no  Past Medical History:  Diagnosis Date   Colon polyp 2015   Farsightedness    wears glasses   Urinary tract infection     Past Surgical History:  Procedure Laterality Date   BREAST REDUCTION SURGERY     CARPAL TUNNEL RELEASE     right   COLONOSCOPY  [address]   [place], [place]   lichenoid keratosis  09/05/11   Skin, left anterior shouldee   PARTIAL HYSTERECTOMY     still has ovaries   WISDOM TOOTH EXTRACTION      Social  History   Socioeconomic History   Marital status: Married    Spouse name: Not on file   Number of children: Not on file   Years of education: Not on file   Highest education level: Not on file  Occupational History   Not on file  Social Needs   Financial resource strain: Not on file   Food insecurity:    Worry: Not on file    Inability: Not on file   Transportation needs:    Medical: Not on file    Non-medical: Not on file  Tobacco Use   Smoking status: Never Smoker   Smokeless tobacco: Never Used  Substance and Sexual Activity   Alcohol use: Yes    Alcohol/week: 10.0 standard drinks    Types: 10 Glasses of wine per week    Comment: several days per week   Drug use: No   Sexual activity: Yes    Birth control/protection: None  Lifestyle   Physical activity:    Days per week: Not on file    Minutes per session: Not on file   Stress: Not on file  Relationships   Social connections:    Talks on phone: Not on file    Gets together: Not on file    Attends religious service: Not on file    Active member of club or organization: Not on file    Attends meetings of clubs or organizations: Not on file  Relationship status: Not on file   Intimate partner violence:    Fear of current or ex partner: Not on file    Emotionally abused: Not on file    Physically abused: Not on file    Forced sexual activity: Not on file  Other Topics Concern   Not on file  Social History Narrative   Married, no children.  Retired, was working for [employer] as a Geophysicist/field seismologist.   Exercise - not much.   07/2018.      Family History  Problem Relation Age of Onset   Alzheimer's disease Mother    Heart disease Neg Hx    Diabetes Neg Hx    Cancer Neg Hx    Stroke Neg Hx    Hypertension Neg Hx    Hyperlipidemia Neg Hx      Current Outpatient Medications:    esomeprazole (NEXIUM) 20 MG capsule, Take 20 mg by mouth daily at 12 noon., Disp: , Rfl:    ondansetron  (ZOFRAN) 4 MG tablet, TAKE 1 TABLET BY MOUTH EVERY 8 HOURS AS NEEDED FOR NAUSEA. (Patient not taking: Reported on 07/27/2014), Disp: 20 tablet, Rfl: 0  No Known Allergies  History reviewed: allergies, current medications, past family history, past medical history, past social history, past surgical history and problem list  Other issues discussed: In the past year has had some problems with motion sickness.  After about an hour in a car gets some motion sickness, has to get out of the car and rest.  No CP, no SOB, no DOE.  No ringing in ear, no headaches.   Mostly symptom is nausea.    Has problems with GERD, saw GI in past, was on medication x 6 months then came off the medication.  Has been using OTC medications but not helping.  Would like to go back on prescription strength GERD medication  Has lump inside vagina, thinks its like a fatty tumor, but not sure.  Has occasional bleeding vaginally.   Sex is uncomfortable, and has bleeding some after sex.  No problems with urination or bowels.   Since last visit here in 2016, quit job, had no insurance for a while.    Now has medicare.      Objective:      BP 138/86 (BP Location: Left Arm, Patient Position: Sitting)    Pulse 80    Temp 99.5 F (37.5 C)    Ht 5\' 7"  (1.702 m)    Wt 169 lb 6.4 oz (76.8 kg)    SpO2 96%    BMI 26.53 kg/m    Wt Readings from Last 3 Encounters:  07/08/18 169 lb 6.4 oz (76.8 kg)  07/27/14 167 lb (75.8 kg)  05/13/13 155 lb (70.3 kg)    Gen: wd, wn, nad, white female Skin: scattered macules, no worrisome lesions HEENT: normocephalic, sclerae anicteric, TMs pearly, nares patent, no discharge or erythema, pharynx normal Oral cavity: MMM, no lesions Neck: supple, no lymphadenopathy, no thyromegaly, no masses Heart: RRR, normal S1, S2, no murmurs Lungs: CTA bilaterally, no wheezes, rhonchi, or rales Abdomen: +bs, soft, non tender, non distended, no masses, no hepatomegaly, no splenomegaly Musculoskeletal:  nontender, no swelling, no obvious deformity Extremities: no edema, no cyanosis, no clubbing Pulses: 2+ symmetric, upper and lower extremities, normal cap refill Neurological: alert, oriented x 3, CN2-12 intact, strength normal upper extremities and lower extremities, sensation normal throughout, DTRs 2+ throughout, no cerebellar signs, gait normal Psychiatric: normal affect, behavior normal, pleasant  Breast: nontender, surgical scars bilat around areola and inferior breasts from prior breast reduction surgery, no masses or lumps, no skin changes, no nipple discharge or inversion, no axillary lymphadenopathy Gyn: Normal external genitalia without lesions, vagina with normal mucosa, s/p hysterectomy but erythemas tissue in posterior vagina, otherwise no obvious mass, no cervical motion tenderness, no abnormal vaginal discharge. adnexa not enlarged, nontender, no masses.   Exam chaperoned by nurse. Rectal: anus normal appearing  EKG Indication screening for heart disease, rate 70 bpm, normal sinus rhythm, PR interval 152 ms, QRS duration 100 ms, QTC 434 ms, axis 47 degrees,   Assessment:   Encounter Diagnoses  Name Primary?   Vaginal bleeding Yes   Encounter for health maintenance examination in adult    Initial Medicare annual wellness visit    Motion sickness, initial encounter    Nausea    Screening for breast cancer    Screening for cervical cancer    Screen for colon cancer    Vaginal mass    Gastroesophageal reflux disease, esophagitis presence not specified    Screening for heart disease    Encounter for hepatitis C screening test for low risk patient    Vaccine counseling    Estrogen deficiency    Post-menopausal    Need for pneumococcal vaccination      Plan:   A preventative services visit was completed today.  During the course of the visit today, we discussed and counseled about appropriate screening and preventive services.  A health risk  assessment was established today that included a review of current medications, allergies, social history, family history, medical and preventative health history, biometrics, and preventative screenings to identify potential safety concerns or impairments.  A personalized plan was printed today for your records and use.   Personalized health advice and education was given today to reduce health risks and promote self management and wellness.  Information regarding end of life planning was discussed today.  Conditions/risks identified: Vaginal bleeding  Nausea and motion sickness   Other  problems discussed today: Motion sickness, nausea - may be age related vestibular changes, but labs today for further eval  Vaginal bleeding, possible mass - refer back to gynecology  Advised mammogram and bone density test.    Shingles vaccine:  I recommend you have a shingles vaccine to help prevent shingles or herpes zoster outbreak.   Please call your insurer to inquire about coverage for the Shingrix vaccine given in 2 doses.   Some insurers cover this vaccine after age 7, some cover this after age 61.  If your insurer covers this, then call to schedule appointment to have this vaccine here.   Recommendations:  I recommend a yearly ophthalmology/optometry visit for glaucoma screening and eye checkup  I recommended a yearly dental visit for hygiene and checkup  Advanced directives - discussed nature and purpose of Advanced Directives, encouraged them to complete them if they have not done so and/or encouraged them to get [place] a copy if they have done this already.  Referrals today: gynecology  Immunizations: I recommended a yearly influenza vaccine, typically in September when the vaccine is usually available  Counseled on the pneumococcal vaccine.  Vaccine information sheet given.  Pneumococcal vaccine Prevnar 13 given after consent obtained.  tdap reportedly within last 8 years       Karmen was seen today for other.  Diagnoses and all orders for this visit:  Vaginal bleeding  Encounter for health maintenance examination in adult  Initial  Medicare annual wellness visit  Motion sickness, initial encounter  Nausea -     Comprehensive metabolic panel -     CBC with Differential/Platelet  Screening for breast cancer -     MM DIGITAL SCREENING BILATERAL; Future  Screening for cervical cancer  Screen for colon cancer  Vaginal mass -     Comprehensive metabolic panel -     Ambulatory referral to Gynecology  Gastroesophageal reflux disease, esophagitis presence not specified  Screening for heart disease -     Lipid panel -     EKG 12-Lead  Encounter for hepatitis C screening test for low risk patient -     Hepatitis C antibody  Vaccine counseling  Estrogen deficiency -     VITAMIN D 25 Hydroxy (Vit-D Deficiency, Fractures) -     DG Bone Density; Future  Post-menopausal -     VITAMIN D 25 Hydroxy (Vit-D Deficiency, Fractures) -     DG Bone Density; Future  Need for pneumococcal vaccination  Other orders -     Pneumococcal conjugate vaccine 13-valent     Medicare Attestation A preventative services visit was completed today.  During the course of the visit the patient was educated and counseled about appropriate screening and preventive services.  A health risk assessment was established with the patient that included a review of current medications, allergies, social history, family history, medical and preventative health history, biometrics, and preventative screenings to identify potential safety concerns or impairments.  A personalized plan was printed today for the patient's records and use.   Personalized health advice and education was given today to reduce health risks and promote self management and wellness.  Information regarding end of life planning was discussed today.  Dorothea Ogle, PA-C   07/08/2018

## 2018-07-09 ENCOUNTER — Other Ambulatory Visit: Payer: Self-pay | Admitting: Medical

## 2018-07-09 LAB — CBC WITH DIFFERENTIAL/PLATELET
Basophils Absolute: 0 10*3/uL (ref 0.0–0.2)
Basos: 1 %
EOS (ABSOLUTE): 0.1 10*3/uL (ref 0.0–0.4)
Eos: 2 %
Hematocrit: 53.5 % — ABNORMAL HIGH (ref 34.0–46.6)
Hemoglobin: 17.3 g/dL — ABNORMAL HIGH (ref 11.1–15.9)
Immature Grans (Abs): 0 10*3/uL (ref 0.0–0.1)
Immature Granulocytes: 0 %
Lymphocytes Absolute: 1.9 10*3/uL (ref 0.7–3.1)
Lymphs: 28 %
MCH: 31.1 pg (ref 26.6–33.0)
MCHC: 32.3 g/dL (ref 31.5–35.7)
MCV: 96 fL (ref 79–97)
Monocytes Absolute: 0.4 10*3/uL (ref 0.1–0.9)
Monocytes: 6 %
Neutrophils Absolute: 4.4 10*3/uL (ref 1.4–7.0)
Neutrophils: 63 %
Platelets: 259 10*3/uL (ref 150–450)
RBC: 5.57 x10E6/uL — ABNORMAL HIGH (ref 3.77–5.28)
RDW: 13.2 % (ref 11.7–15.4)
WBC: 6.9 10*3/uL (ref 3.4–10.8)

## 2018-07-09 LAB — LIPID PANEL
Chol/HDL Ratio: 3.1 ratio (ref 0.0–4.4)
Cholesterol, Total: 271 mg/dL — ABNORMAL HIGH (ref 100–199)
HDL: 87 mg/dL (ref >39)
LDL Calculated: 162 mg/dL — ABNORMAL HIGH (ref 0–99)
Triglycerides: 110 mg/dL (ref 0–149)
VLDL Cholesterol Cal: 22 mg/dL (ref 5–40)

## 2018-07-09 LAB — COMPREHENSIVE METABOLIC PANEL
ALT: 13 IU/L (ref 0–32)
AST: 18 IU/L (ref 0–40)
Albumin/Globulin Ratio: 2.2 (ref 1.2–2.2)
Albumin: 4.8 g/dL (ref 3.8–4.8)
Alkaline Phosphatase: 107 IU/L (ref 39–117)
BUN/Creatinine Ratio: 15 (ref 12–28)
BUN: 12 mg/dL (ref 8–27)
Bilirubin Total: 0.8 mg/dL (ref 0.0–1.2)
CO2: 23 mmol/L (ref 20–29)
Calcium: 10.4 mg/dL — ABNORMAL HIGH (ref 8.7–10.3)
Chloride: 101 mmol/L (ref 96–106)
Creatinine, Ser: 0.81 mg/dL (ref 0.57–1.00)
GFR calc Af Amer: 87 mL/min/{1.73_m2} (ref >59)
GFR calc non Af Amer: 75 mL/min/{1.73_m2} (ref >59)
Globulin, Total: 2.2 g/dL (ref 1.5–4.5)
Glucose: 89 mg/dL (ref 65–99)
Potassium: 4.5 mmol/L (ref 3.5–5.2)
Sodium: 143 mmol/L (ref 134–144)
Total Protein: 7 g/dL (ref 6.0–8.5)

## 2018-07-09 LAB — VITAMIN D 25 HYDROXY (VIT D DEFICIENCY, FRACTURES): Vit D, 25-Hydroxy: 8.1 ng/mL — ABNORMAL LOW (ref 30.0–100.0)

## 2018-07-09 LAB — HEPATITIS C ANTIBODY: Hep C Virus Ab: <0.1 s/co ratio (ref 0.0–0.9)

## 2018-07-09 MED ORDER — VITAMIN D 25 MCG (1000 UNIT) PO TABS: 1000.0000 [IU] | ORAL_TABLET | Freq: Every day | ORAL | 3 refills | Status: DC

## 2018-07-09 MED ORDER — OMEPRAZOLE 40 MG PO CPDR: 40.0000 mg | DELAYED_RELEASE_CAPSULE | Freq: Every day | ORAL | 2 refills | Status: DC

## 2018-07-09 NOTE — Patient Instructions (Signed)
Conditions/risks identified: Vaginal bleeding  Nausea and motion sickness  GERD  Other  problems discussed today: Motion sickness, nausea - may be age related vestibular changes, but labs today for further eval  Vaginal bleeding, possible mass - refer back to gynecology   Please call to schedule your mammogram and bone density test.    [facility]  [phone] [address], [place], [place] [address]  GERD - avoid acid causing foods.   Follow up pending labs    Recommendations:  I recommend a yearly ophthalmology/optometry visit for glaucoma screening and eye checkup  I recommended a yearly dental visit for hygiene and checkup   Referrals today: gynecology   Immunizations: I recommended a yearly influenza vaccine, typically in September when the vaccine is usually available  We updated your Pneumococcal vaccine Prevnar 13.  Shingles vaccine:  I recommend you have a shingles vaccine to help prevent shingles or herpes zoster outbreak.   Please call your insurer to inquire about coverage for the Shingrix vaccine given in 2 doses.   Some insurers cover this vaccine after age 62, some cover this after age 69.  If your insurer covers this, then call to schedule appointment to have this vaccine here.   Your Tdap was reportedly within last 8 years       Health Maintenance After Age 14 After age 65, you are at a higher risk for certain long-term diseases and infections as well as injuries from falls. Falls are a major cause of broken bones and head injuries in people who are older than age 46. Getting regular preventive care can help to keep you healthy and well. Preventive care includes getting regular testing and making lifestyle changes as recommended by your health care provider. Talk with your health care provider about:  Which screenings and tests you should have. A screening is a test that checks for a disease when you have  no symptoms.  A diet and exercise plan that is right for you. What should I know about screenings and tests to prevent falls? Screening and testing are the best ways to find a health problem early. Early diagnosis and treatment give you the best chance of managing medical conditions that are common after age 56. Certain conditions and lifestyle choices may make you more likely to have a fall. Your health care provider may recommend:  Regular vision checks. Poor vision and conditions such as cataracts can make you more likely to have a fall. If you wear glasses, make sure to get your prescription updated if your vision changes.  Medicine review. Work with your health care provider to regularly review all of the medicines you are taking, including over-the-counter medicines. Ask your health care provider about any side effects that may make you more likely to have a fall. Tell your health care provider if any medicines that you take make you feel dizzy or sleepy.  Osteoporosis screening. Osteoporosis is a condition that causes the bones to get weaker. This can make the bones weak and cause them to break more easily.  Blood pressure screening. Blood pressure changes and medicines to control blood pressure can make you feel dizzy.  Strength and balance checks. Your health care provider may recommend certain tests to check your strength and balance while standing, walking, or changing positions.  Foot health exam. Foot pain and numbness, as well as not wearing proper footwear, can make you more likely to have a fall.  Depression  screening. You may be more likely to have a fall if you have a fear of falling, feel emotionally low, or feel unable to do activities that you used to do.  Alcohol use screening. Using too much alcohol can affect your balance and may make you more likely to have a fall. What actions can I take to lower my risk of falls? General instructions  Talk with your health care  provider about your risks for falling. Tell your health care provider if: ? You fall. Be sure to tell your health care provider about all falls, even ones that seem minor. ? You feel dizzy, sleepy, or off-balance.  Take over-the-counter and prescription medicines only as told by your health care provider. These include any supplements.  Eat a healthy diet and maintain a healthy weight. A healthy diet includes low-fat dairy products, low-fat (lean) meats, and fiber from whole grains, beans, and lots of fruits and vegetables. Home safety  Remove any tripping hazards, such as rugs, cords, and clutter.  Install safety equipment such as grab bars in bathrooms and safety rails on stairs.  Keep rooms and walkways well-lit. Activity   Follow a regular exercise program to stay fit. This will help you maintain your balance. Ask your health care provider what types of exercise are appropriate for you.  If you need a cane or walker, use it as recommended by your health care provider.  Wear supportive shoes that have nonskid soles. Lifestyle  Do not drink alcohol if your health care provider tells you not to drink.  If you drink alcohol, limit how much you have: ? 0-1 drink a day for women. ? 0-2 drinks a day for men.  Be aware of how much alcohol is in your drink. In the [place] one drink equals one typical bottle of beer (12 oz), one-half glass of wine (5 oz), or one shot of hard liquor (1 oz).  Do not use any products that contain nicotine or tobacco, such as cigarettes and e-cigarettes. If you need help quitting, ask your health care provider. Summary  Having a healthy lifestyle and getting preventive care can help to protect your health and wellness after age 73.  Screening and testing are the best way to find a health problem early and help you avoid having a fall. Early diagnosis and treatment give you the best chance for managing medical conditions that are more common for people who  are older than age 48.  Falls are a major cause of broken bones and head injuries in people who are older than age 19. Take precautions to prevent a fall at home.  Work with your health care provider to learn what changes you can make to improve your health and wellness and to prevent falls. This information is not intended to replace advice given to you by your health care provider. Make sure you discuss any questions you have with your health care provider. Document Released: 12/05/2016 Document Revised: 12/05/2016 Document Reviewed: 12/05/2016 Elsevier Interactive Patient Education  2019 [employer].

## 2018-07-17 ENCOUNTER — Other Ambulatory Visit: Payer: Self-pay

## 2018-07-18 ENCOUNTER — Encounter: Payer: Self-pay | Admitting: Gynecology

## 2018-07-18 ENCOUNTER — Ambulatory Visit: Payer: Medicare Other | Admitting: Gynecology

## 2018-07-18 VITALS — BP 118/76 | Ht 67.0 in | Wt 167.0 lb

## 2018-07-18 DIAGNOSIS — Z1272 Encounter for screening for malignant neoplasm of vagina: Secondary | ICD-10-CM

## 2018-07-18 DIAGNOSIS — N950 Postmenopausal bleeding: Secondary | ICD-10-CM

## 2018-07-18 DIAGNOSIS — N949 Unspecified condition associated with female genital organs and menstrual cycle: Secondary | ICD-10-CM | POA: Diagnosis not present

## 2018-07-18 DIAGNOSIS — B9689 Other specified bacterial agents as the cause of diseases classified elsewhere: Secondary | ICD-10-CM | POA: Diagnosis not present

## 2018-07-18 DIAGNOSIS — N760 Acute vaginitis: Secondary | ICD-10-CM

## 2018-07-18 LAB — WET PREP FOR TRICH, YEAST, CLUE

## 2018-07-18 MED ORDER — METRONIDAZOLE 0.75 % VA GEL: 1.0000 | Freq: Every day | VAGINAL | 0 refills | Status: DC

## 2018-07-18 NOTE — Addendum Note (Signed)
Addended by: Nelva Nay on: 07/18/2018 12:15 PM   Modules accepted: Orders

## 2018-07-18 NOTE — Patient Instructions (Signed)
Use the prescribed antibiotic vaginal cream nightly for 7 nights.  Follow-up if the vaginal spotting returns.  Monitor the vaginal bump that you feel and follow-up if it gets bigger or causes you symptoms.  Continue to follow-up with your primary provider for routine health care and GYN exams.

## 2018-07-18 NOTE — Progress Notes (Signed)
    Hannah Padilla 03-23-1950 [id number]        68 y.o.  G0P0000 presents having not been seen in the office for a number of years having her regular exams to include pelvic and breast exams through her primary provider with 2 issues:  1. For the past month or so has felt a bump in her vaginal region.  Does not cause pain or discomfort.  Feels soft.  No drainage. 2. Vaginal spotting x1 episode several weeks ago.  No pain associated with this.  History of hysterectomy a number of years ago for benign indications.  No discharge odor or vaginal irritation.  No urinary symptoms such as frequency dysuria urgency low back pain fever or chills  Past medical history,surgical history, problem list, medications, allergies, family history and social history were all reviewed and documented in the EPIC chart.  Directed ROS with pertinent positives and negatives documented in the history of present illness/assessment and plan.  Exam: Caryn Bee assistant Vitals:   07/18/18 1114  BP: 118/76  Weight: 167 lb (75.8 kg)  Height: 5\' 7"  (1.702 m)   General appearance:  Normal Abdomen soft nontender without masses guarding rebound Pelvic external BUS vagina with atrophic changes.  Frothy white discharge noted with vaginal mucosal erythema.  No lesions.  Pap smear of vaginal cuff done.  The area of the patient's pointing to is below her urethral opening along the course of her urethra.  No definitive masses or tenderness on palpation.  Bimanual exam without masses or tenderness.  Rectal exam is normal.  Assessment/Plan:  68 y.o. G0P0000 with:  1. Vaginal bump.  I think she is feeling her urethra.  We discussed possibly a small suburethral cyst.  Is nontender or otherwise symptomatic.  Options to include observation as she can easily feel this area and follow-up if it enlarges or becomes symptomatic versus referral to urology now discussed.  The question is even if it was a small suburethral cyst or diverticuli  would they do anything different at this point given that she is asymptomatic and it would be very small.  The patient is comfortable with observation and will follow-up if it becomes symptomatic or she feels like it is enlarging. 2. Vaginal spotting x1 episode status post hysterectomy.  Pap smear of cuff was done.  Exam showed discharge but no lesions or other abnormalities.  Wet prep returns consistent with bacterial vaginosis which I think accounts for the vaginal irritation and the spotting.  Since her management reviewed and she elects for vaginal cream.  We will treat her with MetroGel applicator bedtime x7 nights.  She will follow-up if her symptoms persist.  She will continue with her primary physician for general health care and GYN exams and screenings and will see me if she has any issues.    Anastasio Auerbach MD, 11:48 AM 07/18/2018

## 2018-07-18 NOTE — Addendum Note (Signed)
Addended by: Nelva Nay on: 07/18/2018 12:17 PM   Modules accepted: Orders

## 2018-07-21 ENCOUNTER — Telehealth: Payer: Self-pay | Admitting: Medical

## 2018-07-21 ENCOUNTER — Other Ambulatory Visit: Payer: Self-pay | Admitting: Medical

## 2018-07-21 LAB — PAP IG W/ RFLX HPV ASCU

## 2018-07-21 MED ORDER — ONDANSETRON HCL 4 MG PO TABS: 4.0000 mg | ORAL_TABLET | Freq: Every day | ORAL | 1 refills | Status: DC | PRN

## 2018-07-21 NOTE — Telephone Encounter (Signed)
Pt would like a call after this medicine is sent in

## 2018-07-21 NOTE — Telephone Encounter (Signed)
Med sent.

## 2018-07-21 NOTE — Telephone Encounter (Signed)
Pt states she went to pick up her Rxs at [facility] they were missing her nausea medicine generic Zofran.

## 2018-07-22 NOTE — Telephone Encounter (Signed)
Tried to call pt 2 times and inform her that rx was sent in to the pharmacy

## 2018-10-16 ENCOUNTER — Other Ambulatory Visit: Payer: Self-pay | Admitting: Medical

## 2018-11-08 ENCOUNTER — Other Ambulatory Visit: Payer: Self-pay | Admitting: Medical

## 2018-11-11 ENCOUNTER — Other Ambulatory Visit: Payer: Self-pay | Admitting: Medical

## 2018-11-11 ENCOUNTER — Telehealth: Payer: Self-pay | Admitting: Medical

## 2018-11-11 MED ORDER — ONDANSETRON HCL 4 MG PO TABS: 4.0000 mg | ORAL_TABLET | Freq: Every day | ORAL | 1 refills | Status: DC | PRN

## 2018-11-11 NOTE — Telephone Encounter (Signed)
Med sent for nausea..  Recommend recheck on symptoms in general

## 2018-11-11 NOTE — Telephone Encounter (Signed)
fax received from pharmacy requesting ondansetron

## 2018-11-12 ENCOUNTER — Encounter: Payer: Self-pay | Admitting: Gynecology

## 2018-11-12 ENCOUNTER — Other Ambulatory Visit: Payer: Self-pay | Admitting: Medical

## 2018-12-20 ENCOUNTER — Other Ambulatory Visit: Payer: Self-pay | Admitting: Medical

## 2019-02-06 DIAGNOSIS — A770 Spotted fever due to Rickettsia rickettsii: Secondary | ICD-10-CM

## 2019-02-06 DIAGNOSIS — R4586 Emotional lability: Secondary | ICD-10-CM

## 2019-02-06 HISTORY — DX: Spotted fever due to Rickettsia rickettsii: A77.0

## 2019-02-06 HISTORY — DX: Emotional lability: R45.86

## 2019-02-07 ENCOUNTER — Other Ambulatory Visit: Payer: Self-pay | Admitting: Medical

## 2019-02-10 NOTE — Telephone Encounter (Signed)
I received a refill request on antinausea medicine.  Please call and see was going on

## 2019-02-12 NOTE — Telephone Encounter (Signed)
Lmom for patient to call and inform us on what the Zofran is needed for.

## 2019-02-16 ENCOUNTER — Telehealth: Payer: Self-pay

## 2019-02-16 ENCOUNTER — Other Ambulatory Visit: Payer: Self-pay | Admitting: Medical

## 2019-02-16 MED ORDER — ONDANSETRON HCL 4 MG PO TABS: ORAL_TABLET | ORAL | 0 refills | Status: DC

## 2019-02-16 NOTE — Telephone Encounter (Signed)
I reviewed the refill request however, I do not see an up-to-date endoscopy or colonoscopy..  It is not appropriate for me to continue refilling Zofran if we have not fully evaluated this issue.  Please verify as I think I am correct, has she not had an endoscopy or colonoscopy within the last 5 years?  If not please refer as this needs to happen  I would like to see her back for follow-up in the next month as well

## 2019-02-16 NOTE — Telephone Encounter (Signed)
Please read previous note and advise.

## 2019-02-16 NOTE — Telephone Encounter (Signed)
Awaiting response from patient 

## 2019-02-16 NOTE — Telephone Encounter (Signed)
Pt. Called stating that she needs a refill on her Zofran to the [facility] in [place] last apt. Was 07/08/18

## 2019-02-17 NOTE — Telephone Encounter (Signed)
Requesting records.   

## 2019-02-17 NOTE — Telephone Encounter (Signed)
Patient stated she has has a colonoscopy within the last 5 years. [facility] in [place] is where it was done at.

## 2019-02-17 NOTE — Telephone Encounter (Signed)
Get records.   Also, given nausea, she should have had EGD/uper endoscopy.   Get records of this too  Make f/u appt

## 2019-03-23 ENCOUNTER — Ambulatory Visit: Payer: Medicare Other | Admitting: Medical

## 2019-04-16 ENCOUNTER — Other Ambulatory Visit: Payer: Self-pay | Admitting: Medical

## 2019-04-16 NOTE — Telephone Encounter (Signed)
Is this okay to refill? 

## 2019-06-06 ENCOUNTER — Other Ambulatory Visit: Payer: Self-pay | Admitting: Medical

## 2019-06-08 NOTE — Telephone Encounter (Signed)
Needs f/u appt, ( see prior few messages about this)

## 2019-06-08 NOTE — Telephone Encounter (Signed)
lmom for patient to schedule an appointment.

## 2019-07-10 DIAGNOSIS — Z20822 Contact with and (suspected) exposure to covid-19: Secondary | ICD-10-CM | POA: Diagnosis not present

## 2019-07-10 DIAGNOSIS — R112 Nausea with vomiting, unspecified: Secondary | ICD-10-CM | POA: Diagnosis not present

## 2019-07-12 DIAGNOSIS — R1013 Epigastric pain: Secondary | ICD-10-CM | POA: Diagnosis not present

## 2019-07-12 DIAGNOSIS — R8271 Bacteriuria: Secondary | ICD-10-CM | POA: Diagnosis not present

## 2019-07-12 DIAGNOSIS — R112 Nausea with vomiting, unspecified: Secondary | ICD-10-CM | POA: Diagnosis not present

## 2019-07-17 ENCOUNTER — Other Ambulatory Visit: Payer: Self-pay | Admitting: Medical

## 2019-07-17 ENCOUNTER — Telehealth: Payer: Self-pay

## 2019-07-17 DIAGNOSIS — R251 Tremor, unspecified: Secondary | ICD-10-CM | POA: Diagnosis not present

## 2019-07-17 DIAGNOSIS — R030 Elevated blood-pressure reading, without diagnosis of hypertension: Secondary | ICD-10-CM | POA: Diagnosis not present

## 2019-07-17 NOTE — Telephone Encounter (Signed)
Pt. Called stating she needed to get scheduled for an apt. Which I got her scheduled next Tuesday for but she also needs a refill on her Zofran to the [facility] in [place].

## 2019-07-20 ENCOUNTER — Other Ambulatory Visit: Payer: Self-pay | Admitting: Medical

## 2019-07-20 MED ORDER — ONDANSETRON HCL 4 MG PO TABS: 4.0000 mg | ORAL_TABLET | Freq: Every day | ORAL | 0 refills | Status: DC | PRN

## 2019-07-21 ENCOUNTER — Encounter: Payer: Self-pay | Admitting: Medical

## 2019-07-21 ENCOUNTER — Ambulatory Visit (INDEPENDENT_AMBULATORY_CARE_PROVIDER_SITE_OTHER): Payer: Medicare Other | Admitting: Medical

## 2019-07-21 ENCOUNTER — Other Ambulatory Visit: Payer: Self-pay

## 2019-07-21 VITALS — BP 122/84 | HR 65 | Wt 169.0 lb

## 2019-07-21 DIAGNOSIS — R251 Tremor, unspecified: Secondary | ICD-10-CM | POA: Insufficient documentation

## 2019-07-21 DIAGNOSIS — R110 Nausea: Secondary | ICD-10-CM | POA: Diagnosis not present

## 2019-07-21 DIAGNOSIS — R7989 Other specified abnormal findings of blood chemistry: Secondary | ICD-10-CM | POA: Insufficient documentation

## 2019-07-21 DIAGNOSIS — E559 Vitamin D deficiency, unspecified: Secondary | ICD-10-CM

## 2019-07-21 DIAGNOSIS — R410 Disorientation, unspecified: Secondary | ICD-10-CM | POA: Insufficient documentation

## 2019-07-21 DIAGNOSIS — D72829 Elevated white blood cell count, unspecified: Secondary | ICD-10-CM | POA: Diagnosis not present

## 2019-07-21 DIAGNOSIS — E278 Other specified disorders of adrenal gland: Secondary | ICD-10-CM

## 2019-07-21 DIAGNOSIS — G479 Sleep disorder, unspecified: Secondary | ICD-10-CM

## 2019-07-21 DIAGNOSIS — R002 Palpitations: Secondary | ICD-10-CM

## 2019-07-21 DIAGNOSIS — R7401 Elevation of levels of liver transaminase levels: Secondary | ICD-10-CM | POA: Insufficient documentation

## 2019-07-21 DIAGNOSIS — G9341 Metabolic encephalopathy: Secondary | ICD-10-CM

## 2019-07-21 DIAGNOSIS — R748 Abnormal levels of other serum enzymes: Secondary | ICD-10-CM | POA: Diagnosis not present

## 2019-07-21 DIAGNOSIS — E876 Hypokalemia: Secondary | ICD-10-CM | POA: Insufficient documentation

## 2019-07-21 DIAGNOSIS — F419 Anxiety disorder, unspecified: Secondary | ICD-10-CM

## 2019-07-21 LAB — POCT URINALYSIS DIP (PROADVANTAGE DEVICE)
Bilirubin, UA: NEGATIVE
Blood, UA: NEGATIVE
Glucose, UA: NEGATIVE mg/dL
Ketones, POC UA: NEGATIVE mg/dL
Nitrite, UA: NEGATIVE
Protein Ur, POC: NEGATIVE mg/dL
Specific Gravity, Urine: 1.015
Urobilinogen, Ur: NEGATIVE
pH, UA: 6.5 (ref 5.0–8.0)

## 2019-07-21 NOTE — Progress Notes (Signed)
Subjective: Chief Complaint  Patient presents with  . Panic Attack    had over past few months with most recent over the last2 weeks   . Memory Loss    which just started since taking xanax    Here with husband today.  She was initially scheduled for med check and chronic nausea.  Husband is worried she is having memory concerns and less quick to respond to things, possibly due to medications.  She was in her usual state of health until a few weeks ago.  A few weeks ago she started having problems with agitation, anxiety, panic attacks, tremors feeling shaky, was feeling disoriented, feeling like a trapped animal, whereas she is normally calm and carefree per husband.    She has drank alcohol regularly in the past but she and husband note this was moreso over a year ago.  However, she did have some wine with a meal about 2 months ago.    She was seen recently at urgent care and emergency dept on 2 separate occasions for these symptoms.  Had labs, eval, but no specific abnormality found . Was put on xanax, beta blocker and hydroxyzine.  These medications seem to be making her confused and mentally disoriented.     She has chronic nausea, has been advised to see GI in the past, but still hasn't seen GI, and hasn't been back for ffollow upin about a year.    Not seeing a counselor.   No major life event recently, and on specific reason why she thinks the anxiety started.  She notes problems with palpitations of heart or a "wiggle" feeling in chest for seconds.   This has happened 3-4 times in the past year.   She notes tension in abdomen ongoing.  Has chronic nausea.  Eating 2 meals per day.  No diarrhea, no vomiting, no burning, + polyuria  Past Medical History:  Diagnosis Date  . Colon polyp 2015  . Farsightedness    wears glasses  . Urinary tract infection    Current Outpatient Medications on File Prior to Visit  Medication Sig Dispense Refill  . ALPRAZolam (XANAX) 0.5 MG tablet  Take by mouth.    . doxycycline (VIBRA-TABS) 100 MG tablet Take by mouth.    . hydrOXYzine (VISTARIL) 25 MG capsule Take by mouth.    . ondansetron (ZOFRAN) 4 MG tablet TAKE 1 TABLET BY MOUTH ONCE DAILY AS NEEDED FOR NAUSEA AND VOMITING 30 tablet 0  . promethazine (PHENERGAN) 25 MG tablet Take 25 mg by mouth every 6 (six) hours as needed.    . propranolol (INDERAL) 10 MG tablet Take by mouth.    . cholecalciferol (VITAMIN D3) 25 MCG (1000 UT) tablet Take 1 tablet (1,000 Units total) by mouth daily. (Patient not taking: Reported on 07/21/2019) 90 tablet 3  . metroNIDAZOLE (METROGEL) 0.75 % vaginal gel Place 1 Applicatorful vaginally at bedtime. For 7 nights (Patient not taking: Reported on 07/21/2019) 70 g 0  . omeprazole (PRILOSEC) 40 MG capsule Take 1 capsule by mouth once daily (Patient not taking: Reported on 07/21/2019) 30 capsule 0   No current facility-administered medications on file prior to visit.   Past Surgical History:  Procedure Laterality Date  . BREAST REDUCTION SURGERY    . CARPAL TUNNEL RELEASE     right  . COLONOSCOPY  [address]   [place], [place]  . lichenoid keratosis  09/05/11   Skin, left anterior shouldee  . PARTIAL HYSTERECTOMY     still  has ovaries  . WISDOM TOOTH EXTRACTION      ROS as in subjective   Objective: BP 122/84   Pulse 65   Wt 169 lb (76.7 kg)   SpO2 98%   BMI 26.47 kg/m    General appearence: alert, seems a little anxious, WD/WN, white female HEENT: normocephalic, sclerae anicteric, PERRLA, EOMi, nares patent, no discharge or erythema, pharynx normal Neck: supple, no lymphadenopathy, no thyromegaly, no masses,no bruit Heart: RRR, normal S1, S2, no murmurs Lungs: CTA bilaterally, no wheezes, rhonchi, or rales Abdomen: +bs, soft, mild left sided tendnerss otherwise non tender, non distended, no masses, no hepatomegaly, no splenomegaly Back: non tender Musculoskeletal: nontender, no swelling, no obvious deformity Extremities: no edema, no cyanosis, no  clubbing Pulses: 2+ symmetric, upper and lower extremities, normal cap refill Neurological: alert, oriented x 3, CN2-12 intact, strength normal upper extremities and lower extremities, sensation normal throughout, DTRs 2+ throughout, no cerebellar signs, gait normal Psychiatric: a little anxious, answers questions appropriately, but repeats a few questions, pleasant    07/12/19, low potassium 3.3 otherwise CMET normal 07/12/19 CBC with elevated WBC 10.8, elevated hemoglobin 16.0 elevated neutrophils 07/12/19 lipase elevated 109 07/12/19 UA with large leukocytes, low SG 07/12/19 lyme disease IgG/IgM normal 07/12/19 TSH normal 07/12/19 Free T4 elevated 1.92 07/10/19 covid negative   Xray 07/12/19 abdomen xray FINDINGS:  There is no evidence of dilated bowel loops or free intraperitoneal  air. No radiopaque calculi or other significant radiographic  abnormalityis seen. Heart size and mediastinal contours are within  normal limits. Both lungs are clear. Marked severity levoscoliosis  of the lumbar spine is noted.   EKG today Indication, palpitations Rate 61 bpm, PR 168 ms, QRS 94 ms, QTC 410 ms, axis 64 degrees, normal sinus rhythm, no acute change    Assessment: Encounter Diagnoses  Name Primary?  Marland Kitchen Anxiety Yes  . Elevated lipase   . Abnormal thyroid blood test   . Sleep disturbance   . Disorientated   . Hypokalemia   . Leukocytosis, unspecified type   . Chronic nausea   . Vitamin D deficiency   . Tremor   . Palpitation      Plan: She is here for acute symptoms and recent change in symptoms and affect overall.  I reviewed her recent emergency department notes and labs.  We discussed that her current collection of symptoms which could represent acute delirium versus other issues.  Her recent labs showed elevated thyroid activity, elevated lipase, elevated white counts, and differential could include hyperthyroid, thyroiditis, urinary tract infection, pancreatitis, abominable process, or  other.  She has chronic nausea, and despite my prior recommendations to see GI, she has not done this yet.  Labs today.  Consider abdominal pelvis CT, consider endocrinology consult, and plan for GI consult.   She is past due on vaccines, and past due on some cancer screens.  Hannah Padilla was seen today for panic attack and memory loss.  Diagnoses and all orders for this visit:  Anxiety  Elevated lipase -     Comprehensive metabolic panel -     Lipase  Abnormal thyroid blood test -     TSH -     T4, free  Sleep disturbance  Disorientated -     POCT Urinalysis DIP (Proadvantage Device) -     Urine Culture -     CBC with Differential/Platelet  Hypokalemia  Leukocytosis, unspecified type -     POCT Urinalysis DIP (Proadvantage Device) -  Urine Culture -     CBC with Differential/Platelet  Chronic nausea -     Comprehensive metabolic panel -     Lipase -     POCT Urinalysis DIP (Proadvantage Device) -     Urine Culture -     CBC with Differential/Platelet -     Ambulatory referral to Gastroenterology  Vitamin D deficiency -     VITAMIN D 25 Hydroxy (Vit-D Deficiency, Fractures)  Tremor -     EKG 12-Lead  Palpitation   F/u pending labs

## 2019-07-22 ENCOUNTER — Other Ambulatory Visit: Payer: Self-pay | Admitting: Medical

## 2019-07-22 ENCOUNTER — Telehealth: Payer: Self-pay | Admitting: Medical

## 2019-07-22 LAB — VITAMIN D 25 HYDROXY (VIT D DEFICIENCY, FRACTURES): Vit D, 25-Hydroxy: 10.6 ng/mL — ABNORMAL LOW (ref 30.0–100.0)

## 2019-07-22 LAB — CBC WITH DIFFERENTIAL/PLATELET
Basophils Absolute: 0.1 10*3/uL (ref 0.0–0.2)
Basos: 1 %
EOS (ABSOLUTE): 0.2 10*3/uL (ref 0.0–0.4)
Eos: 3 %
Hematocrit: 50 % — ABNORMAL HIGH (ref 34.0–46.6)
Hemoglobin: 16.7 g/dL — ABNORMAL HIGH (ref 11.1–15.9)
Immature Grans (Abs): 0 10*3/uL (ref 0.0–0.1)
Immature Granulocytes: 0 %
Lymphocytes Absolute: 2.2 10*3/uL (ref 0.7–3.1)
Lymphs: 29 %
MCH: 31.7 pg (ref 26.6–33.0)
MCHC: 33.4 g/dL (ref 31.5–35.7)
MCV: 95 fL (ref 79–97)
Monocytes Absolute: 0.4 10*3/uL (ref 0.1–0.9)
Monocytes: 6 %
Neutrophils Absolute: 4.4 10*3/uL (ref 1.4–7.0)
Neutrophils: 61 %
Platelets: 300 10*3/uL (ref 150–450)
RBC: 5.26 x10E6/uL (ref 3.77–5.28)
RDW: 12.6 % (ref 11.7–15.4)
WBC: 7.3 10*3/uL (ref 3.4–10.8)

## 2019-07-22 LAB — COMPREHENSIVE METABOLIC PANEL
ALT: 15 IU/L (ref 0–32)
AST: 18 IU/L (ref 0–40)
Albumin/Globulin Ratio: 2.2 (ref 1.2–2.2)
Albumin: 4.7 g/dL (ref 3.8–4.8)
Alkaline Phosphatase: 132 IU/L — ABNORMAL HIGH (ref 48–121)
BUN/Creatinine Ratio: 16 (ref 12–28)
BUN: 12 mg/dL (ref 8–27)
Bilirubin Total: 0.4 mg/dL (ref 0.0–1.2)
CO2: 22 mmol/L (ref 20–29)
Calcium: 10.6 mg/dL — ABNORMAL HIGH (ref 8.7–10.3)
Chloride: 105 mmol/L (ref 96–106)
Creatinine, Ser: 0.77 mg/dL (ref 0.57–1.00)
GFR calc Af Amer: 92 mL/min/{1.73_m2} (ref >59)
GFR calc non Af Amer: 80 mL/min/{1.73_m2} (ref >59)
Globulin, Total: 2.1 g/dL (ref 1.5–4.5)
Glucose: 95 mg/dL (ref 65–99)
Potassium: 4.8 mmol/L (ref 3.5–5.2)
Sodium: 143 mmol/L (ref 134–144)
Total Protein: 6.8 g/dL (ref 6.0–8.5)

## 2019-07-22 LAB — URINE CULTURE

## 2019-07-22 LAB — T4, FREE: Free T4: 1.86 ng/dL — ABNORMAL HIGH (ref 0.82–1.77)

## 2019-07-22 LAB — TSH: TSH: 1.44 u[IU]/mL (ref 0.450–4.500)

## 2019-07-22 LAB — LIPASE: Lipase: 34 U/L (ref 14–72)

## 2019-07-22 MED ORDER — VITAMIN D (ERGOCALCIFEROL) 1.25 MG (50000 UNIT) PO CAPS
50000.0000 [IU] | ORAL_CAPSULE | ORAL | 1 refills | Status: DC
Start: 2019-07-22 — End: 2019-12-03

## 2019-07-22 MED ORDER — ONDANSETRON HCL 4 MG PO TABS: ORAL_TABLET | ORAL | 0 refills | Status: DC

## 2019-07-22 MED ORDER — DOXYCYCLINE HYCLATE 100 MG PO TABS: 100.0000 mg | ORAL_TABLET | Freq: Two times a day (BID) | ORAL | 0 refills | Status: AC

## 2019-07-22 MED ORDER — ALPRAZOLAM 0.5 MG PO TABS: 0.5000 mg | ORAL_TABLET | Freq: Every evening | ORAL | 0 refills | Status: DC | PRN

## 2019-07-22 MED ORDER — PROPRANOLOL HCL 10 MG PO TABS: 10.0000 mg | ORAL_TABLET | Freq: Two times a day (BID) | ORAL | 1 refills | Status: DC

## 2019-07-22 NOTE — Telephone Encounter (Signed)
Tried calling both numbers as well but no answer.

## 2019-07-22 NOTE — Telephone Encounter (Signed)
I tried his number and wife's and no answer. Please call back and get him on the phone

## 2019-07-22 NOTE — Telephone Encounter (Signed)
Pt husband called and states that she needs a refill on xanax and her propranlol and please send to the [facility] in [place]  Please call bruce with any questions [phone]

## 2019-07-22 NOTE — Telephone Encounter (Signed)
Please email  Your labs showed very low vitamin D, still elevated calcium, lipase was normal thankfully, thyroid still showing overactive. White cells were improved.  You were notified today by another practice about + [place] tick fever diagnosis  Recommendations:  Use Doxycycline antibiotic twice daily for 10 days to try and treatment tick fever  Continue Propanolol for thyroid irregularity and elevated pulse  Rest  Hydrate well with water throughout the day  You can use either Zofran for nausea (non-sedating) or the stronger Promethazine/phenergan (sedating) if worse nausea  You can use Tylenol for pain up to 3 times daily  Begin Vitamin D 50,000 units weekly dose  Lets recheck sometimes next week in office  However, if worse symptoms in the next few days, such as high fever, severe headache, uncontrolled vomiting, overall worse, then go back to the emergency department

## 2019-07-27 ENCOUNTER — Other Ambulatory Visit: Payer: Self-pay

## 2019-07-27 ENCOUNTER — Encounter: Payer: Self-pay | Admitting: Medical

## 2019-07-27 ENCOUNTER — Ambulatory Visit (INDEPENDENT_AMBULATORY_CARE_PROVIDER_SITE_OTHER): Payer: Medicare Other | Admitting: Medical

## 2019-07-27 VITALS — BP 100/78 | HR 66 | Ht 67.0 in | Wt 169.2 lb

## 2019-07-27 DIAGNOSIS — G479 Sleep disorder, unspecified: Secondary | ICD-10-CM

## 2019-07-27 DIAGNOSIS — R748 Abnormal levels of other serum enzymes: Secondary | ICD-10-CM

## 2019-07-27 DIAGNOSIS — D72829 Elevated white blood cell count, unspecified: Secondary | ICD-10-CM

## 2019-07-27 DIAGNOSIS — A770 Spotted fever due to Rickettsia rickettsii: Secondary | ICD-10-CM | POA: Diagnosis not present

## 2019-07-27 DIAGNOSIS — E876 Hypokalemia: Secondary | ICD-10-CM

## 2019-07-27 DIAGNOSIS — R7989 Other specified abnormal findings of blood chemistry: Secondary | ICD-10-CM | POA: Diagnosis not present

## 2019-07-27 DIAGNOSIS — R110 Nausea: Secondary | ICD-10-CM

## 2019-07-27 DIAGNOSIS — E559 Vitamin D deficiency, unspecified: Secondary | ICD-10-CM

## 2019-07-27 DIAGNOSIS — F419 Anxiety disorder, unspecified: Secondary | ICD-10-CM

## 2019-07-27 DIAGNOSIS — R002 Palpitations: Secondary | ICD-10-CM

## 2019-07-27 DIAGNOSIS — R410 Disorientation, unspecified: Secondary | ICD-10-CM

## 2019-07-27 NOTE — Progress Notes (Signed)
Subjective: Chief Complaint  Patient presents with  . Follow-up    1 week follow up0still having speech problems   . Fatigue   Here with husband today.    Since her visit 07/21/19 here, she reports improvement since last visit.   Still a little jittery, but improving.   still feels confused at times talking with husband or others.  Her labs from last visit, vitamin D was low, calcium was elevated, lipase was normal compared to other recent labs, thyroid still overactive.  Her white cells had improved.   Since last visit husband notes she is eating her normal meals in normal amounts, no additional agitation, less jittery, stools and urine fine.   Still has some confusion.  For example, husband is managing her medications currently.  She notes less anxious and less headaches from last visit.  Her thinking and processing still is off.   She can watch tv, and discuss things, but short term memory still seems off.     From last visit history:  I saw her on 07/21/2019 for a whole host of symptoms.  She came in with her husband for acute symptoms with memory, less quick to respond to things, anxiety, chronic nausea, and acutely she had started feeling unusual a few weeks ago. A few weeks ago she started having problems with agitation, anxiety, panic attacks, tremors feeling shaky, was feeling disoriented, feeling like a trapped animal, whereas she is normally calm and carefree per husband.  She has drank alcohol regularly in the past but she and husband note this was moreso over a year ago.  However, she did have some wine with a meal about 2 months ago.    She was seen recently at urgent care and emergency dept on 2 separate occasions for these symptoms prior to her last visit with me.  She had labs, eval, but no specific abnormality found . Was put on xanax, beta blocker and hydroxyzine.  These medications seem to be making her confused and mentally disoriented.     She has chronic nausea, has been  advised to see GI in the past, but still hasn't seen GI, and hasn't been back for follow upin about a year.    Not seeing a counselor.   No major life event recently, and on specific reason why she thinks the anxiety started.  She notes problems with palpitations of heart or a "wiggle" feeling in chest for seconds.   This has happened 3-4 times in the past year.   She notes tension in abdomen ongoing.  Has chronic nausea.  Eating 2 meals per day.  No diarrhea, no vomiting, no burning, + polyuria  Past Medical History:  Diagnosis Date  . Colon polyp 2015  . Farsightedness    wears glasses  . Urinary tract infection    Current Outpatient Medications on File Prior to Visit  Medication Sig Dispense Refill  . ALPRAZolam (XANAX) 0.5 MG tablet Take 1 tablet (0.5 mg total) by mouth at bedtime as needed for anxiety. 20 tablet 0  . doxycycline (VIBRA-TABS) 100 MG tablet Take 1 tablet (100 mg total) by mouth 2 (two) times daily for 10 days. 20 tablet 0  . hydrOXYzine (VISTARIL) 25 MG capsule Take by mouth.    Marland Kitchen omeprazole (PRILOSEC) 40 MG capsule Take 1 capsule by mouth once daily 30 capsule 0  . ondansetron (ZOFRAN) 4 MG tablet 1 tablet po q4 hours prn 30 tablet 0  . propranolol (INDERAL) 10 MG tablet  Take 1 tablet (10 mg total) by mouth 2 (two) times daily. 60 tablet 1  . Vitamin D, Ergocalciferol, (DRISDOL) 1.25 MG (50000 UNIT) CAPS capsule Take 1 capsule (50,000 Units total) by mouth every 7 (seven) days. 12 capsule 1  . promethazine (PHENERGAN) 25 MG tablet Take 25 mg by mouth every 6 (six) hours as needed. (Patient not taking: Reported on 07/27/2019)     No current facility-administered medications on file prior to visit.   Past Surgical History:  Procedure Laterality Date  . BREAST REDUCTION SURGERY    . CARPAL TUNNEL RELEASE     right  . COLONOSCOPY  5400   [place], [place]  . lichenoid keratosis  09/05/11   Skin, left anterior shouldee  . PARTIAL HYSTERECTOMY     still has ovaries  .  WISDOM TOOTH EXTRACTION      ROS as in subjective   Objective: BP 100/78   Pulse 66   Ht 5\' 7"  (1.702 m)   Wt 169 lb 3.2 oz (76.7 kg)   SpO2 97%   BMI 26.50 kg/m   BP Readings from Last 3 Encounters:  07/27/19 100/78  07/21/19 122/84  07/18/18 118/76   Wt Readings from Last 3 Encounters:  07/27/19 169 lb 3.2 oz (76.7 kg)  07/21/19 169 lb (76.7 kg)  07/18/18 167 lb (75.8 kg)    General appearence: alert, WD/WN, white female, less anxious than last week Extremities: no edema, no cyanosis, no clubbing Pulses: 2+ symmetric, upper and lower extremities, normal cap refill Neurological: alert, oriented x 3, CN2-12 intact, strength normal upper extremities and lower extremities, sensation normal throughout, DTRs 2+ throughout, no cerebellar signs, gait normal Psychiatric: answers questions appropriately, pleasant    07/12/19, low potassium 3.3 otherwise CMET normal 07/12/19 CBC with elevated WBC 10.8, elevated hemoglobin 16.0 elevated neutrophils 07/12/19 lipase elevated 109 07/12/19 UA with large leukocytes, low SG 07/12/19 lyme disease IgG/IgM normal 07/12/19 TSH normal 07/12/19 Free T4 elevated 1.92 07/10/19 covid negative   Xray 07/12/19 abdomen xray FINDINGS:  There is no evidence of dilated bowel loops or free intraperitoneal  air. No radiopaque calculi or other significant radiographic  abnormalityis seen. Heart size and mediastinal contours are within  normal limits. Both lungs are clear. Marked severity levoscoliosis  of the lumbar spine is noted.   EKG from last week Indication, palpitations Rate 61 bpm, PR 168 ms, QRS 94 ms, QTC 410 ms, axis 64 degrees, normal sinus rhythm, no acute change  We also reviewed additional labs I ordered last week to compare  Results came back + for RMSF    Assessment: Encounter Diagnoses  Name Primary?  . RMSF [facility] spotted fever) Yes  . Anxiety   . Elevated lipase   . Abnormal thyroid blood test   . Sleep disturbance   .  Disorientated   . Hypokalemia   . Leukocytosis, unspecified type   . Vitamin D deficiency   . Palpitation   . Chronic nausea      Plan: Today's visit was a short-term follow-up from her acute symptoms last week.  She is improving.  She still has some confusion and memory changes likely due to acute illness.  She did turn out to have positive antibody for [facility] spotted fever.  Her lipase elevation improved, the hypokalemia resolved, the white count improved based on recent labs we did last week.  She has vitamin D deficiency and will continue vitamin D supplement.  Regarding palpitations and anxiety, she can continue  Xanax and propranolol as needed but hopefully this will resolve as well  RMSF-finish out doxycycline which she has several days left  Memory change anxiety, palpitation - finish doxycycline, rest, hydrate well   If any worse symptoms in the next week they will call back or go to the emergency department  If not then plan to follow-up here in 2 to 4 weeks, including repeat thyroid, PTH and ionized calcium test  Chronic nausea-continue plan for gastroenterology eval  I discussed case with supervising physician Dr. Redmond School about assessment and plan.  Ashtyn was seen today for follow-up and fatigue.  Diagnoses and all orders for this visit:  RMSF [facility] spotted fever)  Anxiety  Elevated lipase  Abnormal thyroid blood test  Sleep disturbance  Disorientated  Hypokalemia  Leukocytosis, unspecified type  Vitamin D deficiency  Palpitation  Chronic nausea   F/u 2- 4 weeks

## 2019-07-31 ENCOUNTER — Other Ambulatory Visit: Payer: Self-pay | Admitting: Medical

## 2019-07-31 ENCOUNTER — Telehealth: Payer: Self-pay | Admitting: Medical

## 2019-07-31 NOTE — Telephone Encounter (Signed)
Pt called for refills of Xanax. Please send to [facility] in [place]. I am sending this to both [place] as pt is almost out and [facility] is not in.

## 2019-08-01 ENCOUNTER — Other Ambulatory Visit: Payer: Self-pay | Admitting: Medical

## 2019-08-01 MED ORDER — ALPRAZOLAM 0.5 MG PO TABS: 0.5000 mg | ORAL_TABLET | Freq: Every evening | ORAL | 0 refills | Status: DC | PRN

## 2019-08-02 DIAGNOSIS — F419 Anxiety disorder, unspecified: Secondary | ICD-10-CM | POA: Diagnosis present

## 2019-08-03 ENCOUNTER — Ambulatory Visit (INDEPENDENT_AMBULATORY_CARE_PROVIDER_SITE_OTHER): Payer: Medicare Other | Admitting: Medical

## 2019-08-03 ENCOUNTER — Other Ambulatory Visit: Payer: Self-pay | Admitting: Medical

## 2019-08-03 ENCOUNTER — Emergency Department (HOSPITAL_COMMUNITY)
Admission: EM | Admit: 2019-08-03 | Discharge: 2019-08-03 | Disposition: A | Discharge status: Home / Self Care | Payer: Medicare Other | Attending: Emergency Medicine | Admitting: Emergency Medicine

## 2019-08-03 ENCOUNTER — Encounter: Payer: Self-pay | Admitting: Medical

## 2019-08-03 ENCOUNTER — Other Ambulatory Visit: Payer: Self-pay

## 2019-08-03 ENCOUNTER — Encounter (HOSPITAL_COMMUNITY): Payer: Self-pay

## 2019-08-03 VITALS — BP 122/80 | HR 71 | Temp 99.0°F

## 2019-08-03 DIAGNOSIS — F410 Panic disorder [episodic paroxysmal anxiety] without agoraphobia: Secondary | ICD-10-CM

## 2019-08-03 DIAGNOSIS — A770 Spotted fever due to Rickettsia rickettsii: Secondary | ICD-10-CM

## 2019-08-03 DIAGNOSIS — R7989 Other specified abnormal findings of blood chemistry: Secondary | ICD-10-CM

## 2019-08-03 DIAGNOSIS — F419 Anxiety disorder, unspecified: Secondary | ICD-10-CM

## 2019-08-03 MED ORDER — ALPRAZOLAM 0.5 MG PO TABS: 0.5000 mg | ORAL_TABLET | Freq: Two times a day (BID) | ORAL | 0 refills | Status: DC | PRN

## 2019-08-03 MED ORDER — DIAZEPAM 2 MG PO TABS
2.0000 mg | ORAL_TABLET | Freq: Once | ORAL | Status: AC
  Administered 2019-08-03: 2 mg via ORAL
  Filled 2019-08-03: qty 1

## 2019-08-03 MED ORDER — ALPRAZOLAM 0.5 MG PO TABS: 0.5000 mg | ORAL_TABLET | Freq: Every evening | ORAL | 0 refills | Status: DC | PRN

## 2019-08-03 NOTE — ED Provider Notes (Signed)
[facility] DEPT Provider Note  CSN: [id number] Arrival date & time: 08/02/19 2331  Chief Complaint(s) Anxiety  HPI [facility] Padilla is a 69 y.o. female   The history is provided by the patient and the spouse.  Anxiety This is a recurrent problem. Episode onset: 1 month. Episode frequency: intermittent. Progression since onset: fluctuating. Pertinent negatives include no chest pain, no abdominal pain, no headaches and no shortness of breath. Nothing aggravates the symptoms. Relieved by: xanax. Treatments tried: vistaril. The treatment provided moderate relief.   Had a panic attack this evening and was given the Vistaril which did not immediately relieved her anxiety, but started working when they got here.    Past Medical History Past Medical History:  Diagnosis Date  . Colon polyp 2015  . Farsightedness    wears glasses  . Urinary tract infection    Patient Active Problem List   Diagnosis Date Noted  . RMSF [facility] spotted fever) 07/27/2019  . Anxiety 07/21/2019  . Elevated lipase 07/21/2019  . Abnormal thyroid blood test 07/21/2019  . Sleep disturbance 07/21/2019  . Disorientated 07/21/2019  . Hypokalemia 07/21/2019  . Leukocytosis 07/21/2019  . Tremor 07/21/2019  . Vitamin D deficiency 07/21/2019  . Palpitation 07/21/2019  . Vaginal bleeding 07/08/2018  . Encounter for hepatitis C screening test for low risk patient 07/08/2018  . Chronic nausea 07/08/2018  . Screening for cervical cancer 07/08/2018  . Vaginal mass 07/08/2018  . Gastroesophageal reflux disease 07/08/2018  . Vaccine counseling 07/08/2018  . Estrogen deficiency 07/08/2018  . Post-menopausal 07/08/2018  . Need for pneumococcal vaccination 07/08/2018   Home Medication(s) Prior to Admission medications   Medication Sig Start Date End Date Taking? Authorizing Provider  ALPRAZolam Duanne Moron) 0.5 MG tablet Take 1 tablet (0.5 mg total) by mouth at bedtime as needed for  anxiety. 08/01/19 07/31/20  Tysinger, Camelia Eng, PA-C  hydrOXYzine (VISTARIL) 25 MG capsule Take by mouth. 07/13/19   [provider]  omeprazole (PRILOSEC) 40 MG capsule Take 1 capsule by mouth once daily 11/12/18   Tysinger, Camelia Eng, PA-C  ondansetron [facility]) 4 MG tablet 1 tablet po q4 hours prn 07/22/19   Tysinger, Camelia Eng, PA-C  promethazine (PHENERGAN) 25 MG tablet Take 25 mg by mouth every 6 (six) hours as needed. Patient not taking: Reported on 07/27/2019 07/13/19   [provider]  propranolol (INDERAL) 10 MG tablet Take 1 tablet (10 mg total) by mouth 2 (two) times daily. 07/22/19 07/21/20  Tysinger, Camelia Eng, PA-C  Vitamin D, Ergocalciferol, (DRISDOL) 1.25 MG (50000 UNIT) CAPS capsule Take 1 capsule (50,000 Units total) by mouth every 7 (seven) days. 07/22/19   Tysinger, Camelia Eng, PA-C                                                                                                                                    Past Surgical History Past Surgical History:  Procedure Laterality Date  . BREAST REDUCTION SURGERY    . CARPAL TUNNEL RELEASE     right  . COLONOSCOPY  2440   [place], [place]  . lichenoid keratosis  09/05/11   Skin, left anterior shouldee  . PARTIAL HYSTERECTOMY     still has ovaries  . WISDOM TOOTH EXTRACTION     Family History Family History  Problem Relation Age of Onset  . Alzheimer's disease Mother   . Heart disease Neg Hx   . Diabetes Neg Hx   . Cancer Neg Hx   . Stroke Neg Hx   . Hypertension Neg Hx   . Hyperlipidemia Neg Hx     Social History Social History   Tobacco Use  . Smoking status: Never Smoker  . Smokeless tobacco: Never Used  Vaping Use  . Vaping Use: Never used  Substance Use Topics  . Alcohol use: Yes    Alcohol/week: 5.0 standard drinks    Types: 5 Glasses of wine per week    Comment: several days per week  . Drug use: No   Allergies Patient has no known allergies.  Review of Systems Review of Systems  Respiratory: Negative  for shortness of breath.   Cardiovascular: Negative for chest pain.  Gastrointestinal: Negative for abdominal pain.  Neurological: Negative for headaches.   All other systems are reviewed and are negative for acute change except as noted in the HPI  Physical Exam Vital Signs  I have reviewed the triage vital signs BP (!) 138/94   Pulse 77   Temp 97.7 F (36.5 C) (Oral)   Resp 16   SpO2 97%   Physical Exam Vitals reviewed.  Constitutional:      General: She is not in acute distress.    Appearance: She is well-developed. She is not diaphoretic.  HENT:     Head: Normocephalic and atraumatic.     Right Ear: External ear normal.     Left Ear: External ear normal.     Nose: Nose normal.  Eyes:     General: No scleral icterus.    Conjunctiva/sclera: Conjunctivae normal.  Neck:     Trachea: Phonation normal.  Cardiovascular:     Rate and Rhythm: Normal rate and regular rhythm.  Pulmonary:     Effort: Pulmonary effort is normal. No respiratory distress.     Breath sounds: No stridor.  Abdominal:     General: There is no distension.  Musculoskeletal:        General: Normal range of motion.     Cervical back: Normal range of motion.  Neurological:     Mental Status: She is alert and oriented to person, place, and time.  Psychiatric:        Behavior: Behavior normal.     ED Results and Treatments Labs (all labs ordered are listed, but only abnormal results are displayed) Labs Reviewed - No data to display  EKG  EKG Interpretation  Date/Time:    Ventricular Rate:    PR Interval:    QRS Duration:   QT Interval:    QTC Calculation:   R Axis:     Text Interpretation:        Radiology No results found.  Pertinent labs & imaging results that were available during my care of the patient were reviewed by me and considered in my medical decision making  (see chart for details).  Medications Ordered in ED Medications  diazepam (VALIUM) tablet 2 mg (2 mg Oral Given 08/03/19 0153)                                                                                                                                    Procedures Procedures  (including critical care time)  Medical Decision Making / ED Course I have reviewed the nursing notes for this encounter and the patient's prior records (if available in EHR or on provided paperwork).   FLEETA KUNDE was evaluated in Emergency Department on 08/03/2019 for the symptoms described in the history of present illness. She was evaluated in the context of the global COVID-19 pandemic, which necessitated consideration that the patient might be at risk for infection with the SARS-CoV-2 virus that causes COVID-19. Institutional protocols and algorithms that pertain to the evaluation of patients at risk for COVID-19 are in a state of rapid change based on information released by regulatory bodies including the CDC and federal and state organizations. These policies and algorithms were followed during the patient's care in the ED.  Anxiety/panic attack Improved with vistaril, but still feels like it may come back. Given small dose of valium here. Will call PCP in the morning.      Final Clinical Impression(s) / ED Diagnoses Final diagnoses:  Anxiety attack   The patient appears reasonably screened and/or stabilized for discharge and I doubt any other medical condition or other [facility] requiring further screening, evaluation, or treatment in the ED at this time prior to discharge. Safe for discharge with strict return precautions.  Disposition: Discharge  Condition: Good  I have discussed the results, Dx and Tx plan with the patient/family who expressed understanding and agree(s) with the plan. Discharge instructions discussed at length. The patient/family was given strict return precautions who verbalized  understanding of the instructions. No further questions at time of discharge.    ED Discharge Orders    None         Follow Up: Primary care provider  Call        This chart was dictated using voice recognition software.  Despite best efforts to proofread,  errors can occur which can change the documentation meaning.   Fatima Blank, MD 08/03/19 [phone]

## 2019-08-03 NOTE — ED Triage Notes (Signed)
Pt reports that for the last month she has been having night sweats, nausea, anxiety, and panic attacks. States that she has also become more forgetful over the last month.   States that she has been dx'd with [facility] Spotted fever. She has finished her prescriptions for that. Reports that she has seen her PCP and they are planning to check her thyroid.   States that her primary concern is the nausea and anxiety.

## 2019-08-03 NOTE — Progress Notes (Signed)
Subjective: Chief Complaint  Patient presents with  . Anxiety    started yesterday evening-woke up not feeling right   . Nausea    feelig nervous    Here for walk in acute visit .  husband brought her in today.    She was just seen in the emergency department last evening for feeling lousy, anxious and agitated, was diagnosed with anxiety and panic.  She was given Valium 2 mg about 2 AM this morning when they discharged her.  She feels that this has not done anything.  She notes running out of Xanax on Sunday yesterday.    Her husband notes that over the last few days she had been doing okay, was not getting agitated, the Xanax was helping taking this twice a day, and overall she had felt improved from the last couple weeks.  Overall though her symptoms started a month ago and have continued to where she does not feel completely well.  She notes the best way to describe how she feels this morning is lousy.  She has had some loose stool yesterday and in the evening and then this morning that she thinks is related to her nerves.  She also feels somewhat tired but no fever, no headache, no back pain, no abdominal pain, no rash.  She has chronic nausea but that was also improved on the Xanax.  She is still scheduled to see gastroenterology middle of next month for baseline eval for chronic nausea  There are no other new triggers of stress in her life   Past Medical History:  Diagnosis Date  . Colon polyp 2015  . Farsightedness    wears glasses  . Urinary tract infection    Current Outpatient Medications on File Prior to Visit  Medication Sig Dispense Refill  . hydrOXYzine (VISTARIL) 25 MG capsule Take by mouth.    Marland Kitchen omeprazole (PRILOSEC) 40 MG capsule Take 1 capsule by mouth once daily 30 capsule 0  . ondansetron (ZOFRAN) 4 MG tablet 1 tablet po q4 hours prn 30 tablet 0  . propranolol (INDERAL) 10 MG tablet Take 1 tablet (10 mg total) by mouth 2 (two) times daily. 60 tablet 1  . Vitamin  D, Ergocalciferol, (DRISDOL) 1.25 MG (50000 UNIT) CAPS capsule Take 1 capsule (50,000 Units total) by mouth every 7 (seven) days. 12 capsule 1  . promethazine (PHENERGAN) 25 MG tablet Take 25 mg by mouth every 6 (six) hours as needed. (Patient not taking: Reported on 07/27/2019)     No current facility-administered medications on file prior to visit.   Past Surgical History:  Procedure Laterality Date  . BREAST REDUCTION SURGERY    . CARPAL TUNNEL RELEASE     right  . COLONOSCOPY  3875   [place], [place]  . lichenoid keratosis  09/05/11   Skin, left anterior shouldee  . PARTIAL HYSTERECTOMY     still has ovaries  . WISDOM TOOTH EXTRACTION      ROS as in subjective   Objective: BP 122/80   Pulse 71   Temp 99 F (37.2 C)   SpO2 96%   BP Readings from Last 3 Encounters:  08/03/19 122/80  08/03/19 (!) 138/94  07/27/19 100/78   Wt Readings from Last 3 Encounters:  07/27/19 169 lb 3.2 oz (76.7 kg)  07/21/19 169 lb (76.7 kg)  07/18/18 167 lb (75.8 kg)   General appearence: alert, white female, seems mildly anxious, certainly no hyperventilating agitation Lungs clear Heart regular rhythm, normal S1-S2,  no murmur Extremities: no edema, no cyanosis, no clubbing Pulses: 2+ symmetric, upper and lower extremities, normal cap refill Neurological: alert, oriented x 3, CN2-12 intact, mild physiological tremor noted but not the obvious or easily noticeable without directed action, strength normal upper extremities and lower extremities, sensation normal throughout, DTRs 2+ throughout, no cerebellar signs, gait normal Psychiatric: answers questions appropriately, pleasant      Assessment: Encounter Diagnoses  Name Primary?  Marland Kitchen Anxiety Yes  . RMSF [facility] spotted fever)   . Abnormal thyroid blood test      Plan: I reviewed her emergency department visit from earlier this morning.  I reviewed her recent visits here, recent labs.  We discussed that she has had a rough couple  of weeks, had the positive RMSF, finished a course of doxycycline, has slightly abnormal T4 levels, and overall is improved from most of the symptoms he has had in recent weeks except anxiety.  She has not had any more headaches, confusion, memory concerns, and even the nausea has been improved.  I tried to reassure her to know that things have been improving and that the anxiety is probably the biggest issue currently related to the recent month of abnormal symptoms she has had.  We discussed breathing techniques, relaxation techniques, I encouraged her to get a counselor to do so acute therapy to help with her anxiousness.  We discussed other medicine to help with anxiety but for now she will continue Xanax twice daily as that has been helping.   She will continue to propanolol started by the emergency department a few weeks ago  I reached out to endocrinology to inquire about the thyroid in relation to tickborne illness.  For now we will continue the same medications with plan to recheck thyroid labs in several weeks once things seem to be back to normal.    I asked her husband and her to call back in 48 hours to give me an update on how she is feeling  Mabrey was seen today for anxiety and nausea.  Diagnoses and all orders for this visit:  Anxiety  RMSF [facility] spotted fever)  Abnormal thyroid blood test  Other orders -     ALPRAZolam (XANAX) 0.5 MG tablet; Take 1 tablet (0.5 mg total) by mouth at bedtime as needed for anxiety.

## 2019-08-03 NOTE — Patient Instructions (Signed)
Ways to deal with anxiety.  I recommend regular exercise such as 30 minutes or more most days of the week such as walking   I recommend taking some time to meditate or pray daily to help slow racing thoughts.  I recommend working on relaxation techniques such as deep breathing exercises in a comfortable position relaxing your body.  There are free Apps on the smart phone for this for example  Consider getting a massage  Journal or use diary to express your ideas on paper to cope with anxiety and stress  Some people use aromatherapy such as lavender to relax  Some people use herbal teas to help calm their mood  Spend some time with animals or your pet if you have one  Consider seeing a counselor to help deal with anxiety and work on specific techniques   Continue the current medications   Plan to call back in 2 to 3 days to give me an update on how you feel

## 2019-08-07 ENCOUNTER — Telehealth: Payer: Self-pay | Admitting: Medical

## 2019-08-07 NOTE — Telephone Encounter (Signed)
[name] called states pt is taking Propranolol 10mg  bid and you had discussed about increasing but he wasn't sure to how much. PT is getting a little agitated and anxious and is concerned with the weekend coming and he didn't want to end back at the ER wanted to know what they could do as far as increasing her meds to help her if needed?  T# [phone] and also you can respond by Mychart

## 2019-08-09 DIAGNOSIS — R110 Nausea: Secondary | ICD-10-CM | POA: Diagnosis not present

## 2019-08-11 ENCOUNTER — Telehealth: Payer: Self-pay | Admitting: Medical

## 2019-08-11 ENCOUNTER — Emergency Department (HOSPITAL_COMMUNITY): Payer: Medicare Other

## 2019-08-11 ENCOUNTER — Encounter (HOSPITAL_COMMUNITY): Payer: Self-pay | Admitting: Obstetrics and Gynecology

## 2019-08-11 ENCOUNTER — Other Ambulatory Visit: Payer: Self-pay

## 2019-08-11 ENCOUNTER — Inpatient Hospital Stay (HOSPITAL_COMMUNITY)
Admission: EM | Admit: 2019-08-11 | Discharge: 2019-08-14 | DRG: 689 | Disposition: A | Discharge status: Home / Self Care | Payer: Medicare Other | Attending: Internal Medicine | Admitting: Internal Medicine

## 2019-08-11 DIAGNOSIS — A770 Spotted fever due to Rickettsia rickettsii: Secondary | ICD-10-CM | POA: Diagnosis present

## 2019-08-11 DIAGNOSIS — R410 Disorientation, unspecified: Secondary | ICD-10-CM | POA: Diagnosis present

## 2019-08-11 DIAGNOSIS — Z820 Family history of epilepsy and other diseases of the nervous system: Secondary | ICD-10-CM

## 2019-08-11 DIAGNOSIS — F419 Anxiety disorder, unspecified: Secondary | ICD-10-CM

## 2019-08-11 DIAGNOSIS — B951 Streptococcus, group B, as the cause of diseases classified elsewhere: Secondary | ICD-10-CM | POA: Diagnosis present

## 2019-08-11 DIAGNOSIS — N390 Urinary tract infection, site not specified: Secondary | ICD-10-CM | POA: Diagnosis not present

## 2019-08-11 DIAGNOSIS — Z86010 Personal history of colonic polyps: Secondary | ICD-10-CM | POA: Diagnosis not present

## 2019-08-11 DIAGNOSIS — I6782 Cerebral ischemia: Secondary | ICD-10-CM | POA: Diagnosis not present

## 2019-08-11 DIAGNOSIS — Z79899 Other long term (current) drug therapy: Secondary | ICD-10-CM

## 2019-08-11 DIAGNOSIS — E559 Vitamin D deficiency, unspecified: Secondary | ICD-10-CM | POA: Diagnosis not present

## 2019-08-11 DIAGNOSIS — G9341 Metabolic encephalopathy: Secondary | ICD-10-CM | POA: Diagnosis present

## 2019-08-11 DIAGNOSIS — W57XXXA Bitten or stung by nonvenomous insect and other nonvenomous arthropods, initial encounter: Secondary | ICD-10-CM | POA: Diagnosis present

## 2019-08-11 DIAGNOSIS — G9389 Other specified disorders of brain: Secondary | ICD-10-CM | POA: Diagnosis not present

## 2019-08-11 DIAGNOSIS — G479 Sleep disorder, unspecified: Secondary | ICD-10-CM | POA: Diagnosis not present

## 2019-08-11 DIAGNOSIS — R4182 Altered mental status, unspecified: Secondary | ICD-10-CM

## 2019-08-11 DIAGNOSIS — Z20822 Contact with and (suspected) exposure to covid-19: Secondary | ICD-10-CM | POA: Diagnosis present

## 2019-08-11 DIAGNOSIS — Z90711 Acquired absence of uterus with remaining cervical stump: Secondary | ICD-10-CM

## 2019-08-11 DIAGNOSIS — F410 Panic disorder [episodic paroxysmal anxiety] without agoraphobia: Secondary | ICD-10-CM | POA: Diagnosis present

## 2019-08-11 DIAGNOSIS — Z03818 Encounter for observation for suspected exposure to other biological agents ruled out: Secondary | ICD-10-CM | POA: Diagnosis not present

## 2019-08-11 DIAGNOSIS — J3489 Other specified disorders of nose and nasal sinuses: Secondary | ICD-10-CM | POA: Diagnosis not present

## 2019-08-11 DIAGNOSIS — F4321 Adjustment disorder with depressed mood: Secondary | ICD-10-CM | POA: Diagnosis not present

## 2019-08-11 DIAGNOSIS — G9340 Encephalopathy, unspecified: Secondary | ICD-10-CM | POA: Diagnosis not present

## 2019-08-11 LAB — CBC WITH DIFFERENTIAL/PLATELET
Abs Immature Granulocytes: 0.02 10*3/uL (ref 0.00–0.07)
Basophils Absolute: 0.1 10*3/uL (ref 0.0–0.1)
Basophils Relative: 1 %
Eosinophils Absolute: 0.2 10*3/uL (ref 0.0–0.5)
Eosinophils Relative: 3 %
HCT: 46.1 % — ABNORMAL HIGH (ref 36.0–46.0)
Hemoglobin: 15.4 g/dL — ABNORMAL HIGH (ref 12.0–15.0)
Immature Granulocytes: 0 %
Lymphocytes Relative: 25 %
Lymphs Abs: 1.9 10*3/uL (ref 0.7–4.0)
MCH: 31.6 pg (ref 26.0–34.0)
MCHC: 33.4 g/dL (ref 30.0–36.0)
MCV: 94.5 fL (ref 80.0–100.0)
Monocytes Absolute: 0.4 10*3/uL (ref 0.1–1.0)
Monocytes Relative: 5 %
Neutro Abs: 5.1 10*3/uL (ref 1.7–7.7)
Neutrophils Relative %: 66 %
Platelets: 225 10*3/uL (ref 150–400)
RBC: 4.88 MIL/uL (ref 3.87–5.11)
RDW: 12.1 % (ref 11.5–15.5)
WBC: 7.7 10*3/uL (ref 4.0–10.5)
nRBC: 0 % (ref 0.0–0.2)

## 2019-08-11 LAB — URINALYSIS, ROUTINE W REFLEX MICROSCOPIC
Bilirubin Urine: NEGATIVE
Glucose, UA: NEGATIVE mg/dL
Hgb urine dipstick: NEGATIVE
Ketones, ur: 5 mg/dL — AB
Nitrite: NEGATIVE
Protein, ur: NEGATIVE mg/dL
Specific Gravity, Urine: 1.015 (ref 1.005–1.030)
WBC, UA: >50 WBC/hpf — ABNORMAL HIGH (ref 0–5)
pH: 5 (ref 5.0–8.0)

## 2019-08-11 LAB — COMPREHENSIVE METABOLIC PANEL WITH GFR
ALT: 15 U/L (ref 0–44)
AST: 18 U/L (ref 15–41)
Albumin: 4.1 g/dL (ref 3.5–5.0)
Alkaline Phosphatase: 76 U/L (ref 38–126)
Anion gap: 11 (ref 5–15)
BUN: 22 mg/dL (ref 8–23)
CO2: 26 mmol/L (ref 22–32)
Calcium: 9.4 mg/dL (ref 8.9–10.3)
Chloride: 103 mmol/L (ref 98–111)
Creatinine, Ser: 0.93 mg/dL (ref 0.44–1.00)
GFR calc Af Amer: >60 mL/min
GFR calc non Af Amer: >60 mL/min
Glucose, Bld: 103 mg/dL — ABNORMAL HIGH (ref 70–99)
Potassium: 4.2 mmol/L (ref 3.5–5.1)
Sodium: 140 mmol/L (ref 135–145)
Total Bilirubin: 0.8 mg/dL (ref 0.3–1.2)
Total Protein: 6.7 g/dL (ref 6.5–8.1)

## 2019-08-11 LAB — RAPID URINE DRUG SCREEN, HOSP PERFORMED
Amphetamines: NOT DETECTED
Barbiturates: NOT DETECTED
Benzodiazepines: POSITIVE — AB
Cocaine: NOT DETECTED
Opiates: NOT DETECTED
Tetrahydrocannabinol: NOT DETECTED

## 2019-08-11 LAB — ETHANOL: Alcohol, Ethyl (B): <10 mg/dL (ref <10)

## 2019-08-11 LAB — SARS CORONAVIRUS 2 BY RT PCR (HOSPITAL ORDER, PERFORMED IN ~~LOC~~ HOSPITAL LAB): SARS Coronavirus 2: NEGATIVE

## 2019-08-11 MED ORDER — HYDROXYZINE HCL 25 MG PO TABS
25.0000 mg | ORAL_TABLET | Freq: Three times a day (TID) | ORAL | Status: DC | PRN
  Administered 2019-08-11 – 2019-08-14 (×5): 25 mg via ORAL
  Filled 2019-08-11 (×5): qty 1

## 2019-08-11 MED ORDER — PROPRANOLOL HCL 10 MG PO TABS
10.0000 mg | ORAL_TABLET | Freq: Two times a day (BID) | ORAL | Status: DC
  Administered 2019-08-11 – 2019-08-14 (×6): 10 mg via ORAL
  Filled 2019-08-11 (×7): qty 1

## 2019-08-11 MED ORDER — LORAZEPAM 2 MG/ML IJ SOLN
0.5000 mg | Freq: Once | INTRAMUSCULAR | Status: AC
  Administered 2019-08-12: 0.5 mg via INTRAVENOUS
  Filled 2019-08-11: qty 1

## 2019-08-11 MED ORDER — SODIUM CHLORIDE 0.9 % IV SOLN: INTRAVENOUS | Status: DC

## 2019-08-11 MED ORDER — PROMETHAZINE HCL 25 MG PO TABS
25.0000 mg | ORAL_TABLET | Freq: Four times a day (QID) | ORAL | Status: DC | PRN
  Administered 2019-08-11 – 2019-08-14 (×3): 25 mg via ORAL
  Filled 2019-08-11 (×4): qty 1

## 2019-08-11 MED ORDER — LORAZEPAM 1 MG PO TABS
1.0000 mg | ORAL_TABLET | Freq: Once | ORAL | Status: AC
  Administered 2019-08-11: 1 mg via ORAL
  Filled 2019-08-11: qty 1

## 2019-08-11 MED ORDER — CLONIDINE HCL 0.1 MG PO TABS
0.1000 mg | ORAL_TABLET | Freq: Two times a day (BID) | ORAL | Status: DC
  Administered 2019-08-11 – 2019-08-13 (×4): 0.1 mg via ORAL
  Filled 2019-08-11 (×4): qty 1

## 2019-08-11 NOTE — ED Notes (Signed)
Patient transported to MRI via wheelchair at this time.

## 2019-08-11 NOTE — Telephone Encounter (Signed)
Patient husband was advised to take patient to [facility] per provider.

## 2019-08-11 NOTE — Telephone Encounter (Signed)
Pts husband called and said he is in route to [facility] because wife has been having panic attacks, he wants pt to be admitted. Pts husband wants to know if you could call and do a report so the Drs will know whats going on and she can be admitted. Pts husband [name] can be reached at [phone]

## 2019-08-11 NOTE — Telephone Encounter (Signed)
Per verbal with nurse and front office, advised to continue plan for emergency dept visit

## 2019-08-11 NOTE — ED Provider Notes (Signed)
[facility] DEPT Provider Note   CSN: [id number] Arrival date & time: 08/11/19  1009     History Chief Complaint  Patient presents with   Altered Mental Status   Panic Attack   Agitation    Hannah Padilla is a 69 y.o. female.  HPI She presents for evaluation of panic attacks, and altered mental status.  Gradual onset of symptoms over the last month, not improving despite multiple encounters with primary care providers.  Her husband is concerned about her change in behavior and would like to have her "admitted to be managed by a single provider."  He is concerned that his wife is very different from his usual, is unable to perform all of her ADLs, is confused, and seems to be getting worse.  She was diagnosed and treated with doxycycline for suspected [facility] spotted fever, after a tick bite about 5 weeks ago.  1 week ago her Xanax was increased from 2 to 4 times a day, without improvement of her symptoms of intermittent panic attacks.  There is been no fever, chills, nausea, vomiting, cough or shortness of breath within the last week.  Patient has never had similar problems, in the past.  There are no other known modifying factors.    Past Medical History:  Diagnosis Date   Colon polyp 2015   Farsightedness    wears glasses   Urinary tract infection     Patient Active Problem List   Diagnosis Date Noted   RMSF [facility] spotted fever) 07/27/2019   Anxiety 07/21/2019   Elevated lipase 07/21/2019   Abnormal thyroid blood test 07/21/2019   Sleep disturbance 07/21/2019   Disorientated 07/21/2019   Hypokalemia 07/21/2019   Leukocytosis 07/21/2019   Tremor 07/21/2019   Vitamin D deficiency 07/21/2019   Palpitation 07/21/2019   Vaginal bleeding 07/08/2018   Encounter for hepatitis C screening test for low risk patient 07/08/2018   Chronic nausea 07/08/2018   Screening for cervical cancer 07/08/2018   Vaginal mass  07/08/2018   Gastroesophageal reflux disease 07/08/2018   Vaccine counseling 07/08/2018   Estrogen deficiency 07/08/2018   Post-menopausal 07/08/2018   Need for pneumococcal vaccination 07/08/2018    Past Surgical History:  Procedure Laterality Date   BREAST REDUCTION SURGERY     CARPAL TUNNEL RELEASE     right   COLONOSCOPY  [address]   [place], [place]   lichenoid keratosis  09/05/11   Skin, left anterior shouldee   PARTIAL HYSTERECTOMY     still has ovaries   WISDOM TOOTH EXTRACTION       OB History    Gravida  0   Para  0   Term  0   Preterm  0   AB  0   Living  0     SAB  0   TAB  0   Ectopic  0   Multiple  0   Live Births  0           Family History  Problem Relation Age of Onset   Alzheimer's disease Mother    Heart disease Neg Hx    Diabetes Neg Hx    Cancer Neg Hx    Stroke Neg Hx    Hypertension Neg Hx    Hyperlipidemia Neg Hx     Social History   Tobacco Use   Smoking status: Never Smoker   Smokeless tobacco: Never Used  Vaping Use   Vaping Use: Never used  Substance  Use Topics   Alcohol use: Yes    Alcohol/week: 5.0 standard drinks    Types: 5 Glasses of wine per week    Comment: several days per week   Drug use: No    Home Medications Prior to Admission medications   Medication Sig Start Date End Date Taking? Authorizing Provider  ALPRAZolam Duanne Moron) 0.5 MG tablet Take 1 tablet (0.5 mg total) by mouth 2 (two) times daily as needed for up to 20 days for anxiety. 08/03/19 08/23/19 Yes Tysinger, Camelia Eng, PA-C  cloNIDine (CATAPRES) 0.1 MG tablet Take 0.1 mg by mouth 2 (two) times daily. 08/09/19  Yes [provider]  naproxen (NAPROSYN) 375 MG tablet Take 375 mg by mouth 3 (three) times daily as needed for mild pain.   Yes [provider]  omeprazole (PRILOSEC) 40 MG capsule Take 1 capsule by mouth once daily 11/12/18  Yes Tysinger, Camelia Eng, PA-C  ondansetron (ZOFRAN) 4 MG tablet 1 tablet po q4 hours  prn Patient taking differently: Take 4 mg by mouth every 4 (four) hours as needed (panic attack). 1 tablet po q4 hours prn 07/22/19  Yes Tysinger, Camelia Eng, PA-C  promethazine (PHENERGAN) 25 MG tablet Take 25 mg by mouth every 6 (six) hours as needed.  07/13/19  Yes [provider]  propranolol (INDERAL) 10 MG tablet Take 1 tablet (10 mg total) by mouth 2 (two) times daily. 07/22/19 07/21/20 Yes Tysinger, Camelia Eng, PA-C  Vitamin D, Ergocalciferol, (DRISDOL) 1.25 MG (50000 UNIT) CAPS capsule Take 1 capsule (50,000 Units total) by mouth every 7 (seven) days. 07/22/19  Yes Tysinger, Camelia Eng, PA-C  hydrOXYzine (VISTARIL) 25 MG capsule Take 25 mg by mouth 3 (three) times daily as needed for anxiety.  07/13/19   [provider]    Allergies    Patient has no known allergies.  Review of Systems   Review of Systems  All other systems reviewed and are negative.   Physical Exam Updated Vital Signs BP 106/68    Pulse (!) 56    Temp 98 F (36.7 C) (Oral)    Resp 15    Ht 5\' 8"  (1.727 m)    Wt 77.1 kg    SpO2 98%    BMI 25.85 kg/m   Physical Exam Vitals and nursing note reviewed.  Constitutional:      Appearance: She is well-developed.  HENT:     Head: Normocephalic and atraumatic.     Right Ear: External ear normal.     Left Ear: External ear normal.     Nose: No congestion or rhinorrhea.     Mouth/Throat:     Pharynx: No oropharyngeal exudate or posterior oropharyngeal erythema.  Eyes:     Conjunctiva/sclera: Conjunctivae normal.     Pupils: Pupils are equal, round, and reactive to light.  Neck:     Trachea: Phonation normal.  Cardiovascular:     Rate and Rhythm: Normal rate and regular rhythm.     Heart sounds: Normal heart sounds.  Pulmonary:     Effort: Pulmonary effort is normal.     Breath sounds: Normal breath sounds.  Abdominal:     Palpations: Abdomen is soft.     Tenderness: There is no abdominal tenderness.  Musculoskeletal:        General: Normal range of  motion.     Cervical back: Normal range of motion and neck supple.     Comments: Normal strength, arms and legs bilaterally.  Skin:  General: Skin is warm and dry.  Neurological:     Mental Status: She is alert and oriented to person, place, and time.     Cranial Nerves: No cranial nerve deficit.     Sensory: No sensory deficit.     Motor: No abnormal muscle tone.     Coordination: Coordination normal.     Comments: No dysarthria or aphasia.  Mild confusion for recent events.  With ambulation she is slightly ataxic and reaches out to support herself while turning, when walking.  Psychiatric:        Mood and Affect: Mood normal.        Behavior: Behavior normal.        Thought Content: Thought content normal.        Judgment: Judgment normal.     ED Results / Procedures / Treatments   Labs (all labs ordered are listed, but only abnormal results are displayed) Labs Reviewed  COMPREHENSIVE METABOLIC PANEL - Abnormal; Notable for the following components:      Result Value   Glucose, Bld 103 (*)    All other components within normal limits  CBC WITH DIFFERENTIAL/PLATELET - Abnormal; Notable for the following components:   Hemoglobin 15.4 (*)    HCT 46.1 (*)    All other components within normal limits  SARS CORONAVIRUS 2 BY RT PCR (HOSPITAL ORDER, [facility])  ETHANOL  RAPID URINE DRUG SCREEN, HOSP PERFORMED  URINALYSIS, ROUTINE W REFLEX MICROSCOPIC    EKG None  Radiology MR BRAIN WO CONTRAST  Result Date: 08/11/2019 CLINICAL DATA:  Encephalopathy EXAM: MRI HEAD WITHOUT CONTRAST TECHNIQUE: Multiplanar, multiecho pulse sequences of the brain and surrounding structures were obtained without intravenous contrast. COMPARISON:  None. FINDINGS: Brain: There is no acute infarction or intracranial hemorrhage. There is no intracranial mass, mass effect, or edema. There is no hydrocephalus or extra-axial fluid collection. Ventricles and sulci are normal in  size and configuration. Patchy foci of T2 hyperintensity in the supratentorial white matter are nonspecific but may reflect mild chronic microvascular ischemic changes. Vascular: Major vessel flow voids at the skull base are preserved. Skull and upper cervical spine: Normal marrow signal is preserved. Sinuses/Orbits: Trace paranasal sinus mucosal thickening. Orbits are unremarkable. Other: Sella is unremarkable.  Mastoid air cells are clear. IMPRESSION: No evidence of recent infarction, hemorrhage, or mass. Mild chronic microvascular ischemic changes. Electronically Signed   By: Macy Mis M.D.   On: 08/11/2019 14:00    Procedures Procedures (including critical care time)  Medications Ordered in ED Medications  0.9 %  sodium chloride infusion ( Intravenous New Bag/Given 08/11/19 1142)  LORazepam (ATIVAN) injection 0.5 mg (0 mg Intravenous Hold 08/11/19 1145)  LORazepam (ATIVAN) tablet 1 mg (1 mg Oral Given 08/11/19 1142)    ED Course  I have reviewed the triage vital signs and the nursing notes.  Pertinent labs & imaging results that were available during my care of the patient were reviewed by me and considered in my medical decision making (see chart for details).  Clinical Course as of Aug 10 1632  Tue Aug 11, 2019  1632 Normal  Ethanol [EW]  1632 Normal except hemoglobin elevated  CBC with Differential(!) [EW]  1632 Normal except glucose high  Comprehensive metabolic panel(!) [EW]  9211 Per radiologist, no acute intracranial abnormality.  MR BRAIN WO CONTRAST [EW]    Clinical Course User Index [EW] Daleen Bo, MD   MDM Rules/Calculators/A&P  Patient Vitals for the past 24 hrs:  BP Temp Temp src Pulse Resp SpO2 Height Weight  08/11/19 1425 106/68 -- -- (!) 56 15 98 % -- --  08/11/19 1300 107/70 -- -- (!) 56 16 98 % -- --  08/11/19 1159 100/73 -- -- (!) 59 16 99 % -- --  08/11/19 1018 98/70 98 F (36.7 C) Oral 72 16 98 % 5\' 8"  (1.727 m) 77.1 kg     4:34 PM Reevaluation with update and discussion. After initial assessment and treatment, an updated evaluation reveals no change in clinical status.  Patient and family member, husband, updated on findings and plan. Daleen Bo   Medical Decision Making:  This patient is presenting for evaluation of panic attacks with confusion, which does require a range of treatment options, and is a complaint that involves a high risk of morbidity and mortality. The differential diagnoses include nonspecific encephalopathy, dementia, anxiety. I decided to review old records, and in summary elderly female, with subacute onset of symptoms of anxiety and confusion, with recent tick bite and treatment for RMSF.  I obtained additional historical information from her husband at the bedside..  Clinical Laboratory Tests Ordered, included CBC, Metabolic panel, Urinalysis and Alcohol level, drug screen. Review indicates no acute abnormalities to explain panic attacks. Radiologic Tests Ordered, included MRI head.  I independently Visualized: Radiographic images, which show no acute or chronic abnormalities   Critical Interventions-clinical evaluation, laboratory testing, MRI imaging of the brain, observation and reevaluation.  TTS consultation requested  After These Interventions, the Patient was reevaluated and was found relatively stable without evidence for encephalopathy from RMSF, acute medical illness or vital sign abnormality.  CRITICAL CARE-no Performed by: Daleen Bo  Nursing Notes Reviewed/ Care Coordinated Applicable Imaging Reviewed Interpretation of Laboratory Data incorporated into ED treatment     Final Clinical Impression(s) / ED Diagnoses Final diagnoses:  Anxiety    Rx / DC Orders ED Discharge Orders    None       Daleen Bo, MD 08/11/19 [phone]

## 2019-08-11 NOTE — ED Triage Notes (Signed)
Patient reportedly has been having anxiety attacks, confusion and episodic events of panic. Patient's husband wishes for her to be admitted for care as he says they are an hour away from any care. Patient reports she feels she cannot string together words. Patient's Neuro exam unremarkable

## 2019-08-12 ENCOUNTER — Encounter (HOSPITAL_COMMUNITY): Payer: Self-pay | Admitting: Emergency Medicine

## 2019-08-12 DIAGNOSIS — R4182 Altered mental status, unspecified: Secondary | ICD-10-CM

## 2019-08-12 DIAGNOSIS — A770 Spotted fever due to Rickettsia rickettsii: Secondary | ICD-10-CM | POA: Diagnosis not present

## 2019-08-12 DIAGNOSIS — R410 Disorientation, unspecified: Secondary | ICD-10-CM

## 2019-08-12 DIAGNOSIS — F4321 Adjustment disorder with depressed mood: Secondary | ICD-10-CM

## 2019-08-12 DIAGNOSIS — N390 Urinary tract infection, site not specified: Secondary | ICD-10-CM | POA: Diagnosis present

## 2019-08-12 DIAGNOSIS — G479 Sleep disorder, unspecified: Secondary | ICD-10-CM

## 2019-08-12 MED ORDER — ACETAMINOPHEN 325 MG PO TABS: 650.0000 mg | ORAL_TABLET | Freq: Four times a day (QID) | ORAL | Status: DC | PRN

## 2019-08-12 MED ORDER — ONDANSETRON HCL 4 MG/2ML IJ SOLN
4.0000 mg | Freq: Four times a day (QID) | INTRAMUSCULAR | Status: DC | PRN
  Administered 2019-08-13 – 2019-08-14 (×2): 4 mg via INTRAVENOUS
  Filled 2019-08-12 (×2): qty 2

## 2019-08-12 MED ORDER — CEPHALEXIN 500 MG PO CAPS: 500.0000 mg | ORAL_CAPSULE | ORAL | Status: DC

## 2019-08-12 MED ORDER — ZOLPIDEM TARTRATE 5 MG PO TABS: 5.0000 mg | ORAL_TABLET | Freq: Every evening | ORAL | Status: DC | PRN

## 2019-08-12 MED ORDER — SODIUM CHLORIDE 0.9 % IV SOLN
1.0000 g | INTRAVENOUS | Status: DC
  Administered 2019-08-13 – 2019-08-14 (×2): 1 g via INTRAVENOUS
  Filled 2019-08-12 (×2): qty 1

## 2019-08-12 MED ORDER — ONDANSETRON HCL 4 MG PO TABS
4.0000 mg | ORAL_TABLET | Freq: Four times a day (QID) | ORAL | Status: DC | PRN
  Administered 2019-08-12: 4 mg via ORAL
  Filled 2019-08-12: qty 1

## 2019-08-12 MED ORDER — ACETAMINOPHEN 650 MG RE SUPP: 650.0000 mg | Freq: Four times a day (QID) | RECTAL | Status: DC | PRN

## 2019-08-12 MED ORDER — ALPRAZOLAM 0.5 MG PO TABS
0.5000 mg | ORAL_TABLET | Freq: Two times a day (BID) | ORAL | Status: DC | PRN
  Administered 2019-08-12 – 2019-08-14 (×5): 0.5 mg via ORAL
  Filled 2019-08-12 (×5): qty 1

## 2019-08-12 MED ORDER — SODIUM CHLORIDE 0.9 % IV SOLN
1.0000 g | Freq: Once | INTRAVENOUS | Status: AC
  Administered 2019-08-12: 1 g via INTRAVENOUS
  Filled 2019-08-12: qty 10

## 2019-08-12 MED ORDER — VITAMIN D (ERGOCALCIFEROL) 1.25 MG (50000 UNIT) PO CAPS
50000.0000 [IU] | ORAL_CAPSULE | ORAL | Status: DC
  Administered 2019-08-12: 50000 [IU] via ORAL
  Filled 2019-08-12: qty 1

## 2019-08-12 MED ORDER — ENOXAPARIN SODIUM 40 MG/0.4ML ~~LOC~~ SOLN
40.0000 mg | SUBCUTANEOUS | Status: DC
  Administered 2019-08-12 – 2019-08-13 (×2): 40 mg via SUBCUTANEOUS
  Filled 2019-08-12 (×2): qty 0.4

## 2019-08-12 NOTE — ED Notes (Signed)
Dr. Nicoletta Dress, admitting physician, wants to speak with husband when he returns.

## 2019-08-12 NOTE — ED Notes (Signed)
TTS in progress 

## 2019-08-12 NOTE — ED Notes (Signed)
Hannah Padilla ( husband) [phone]

## 2019-08-12 NOTE — ED Provider Notes (Signed)
Patient is persistently altered and has an untreated UTI.  I have started rocephin for this and will admit medically as there is no acute psychiatric condition.  Given tick exposure, have sent tick borne illness panel.     Rayelynn Loyal, MD 08/12/19 [phone]

## 2019-08-12 NOTE — H&P (Signed)
History and Physical    Hannah Padilla:[id number] DOB: 1950/12/08 DOA: 08/11/2019  PCP: Carlena Hurl, PA-C Patient coming from:  home  Chief Complaint: Confusion and anxiety  HPI: Hannah Padilla is a 69 y.o. female with medical history significant of previous urine tract infection, vitamin D deficiency, colon polyps who presented with confusion and anxiety.  Patient is a poor historian and H&P is obtained by chart review, talking to patient and ED staff.  Patient reports that her father passed away about 2-3 months ago, and she recently had tick exposure and was started on doxycycline about 5 weeks ago.   She apparently started to have gradual onset of intermittent panic attacks over last month.  Per chart review, her husband was concerned that the patient was confused, very different from her usual, and unable to perform all of her ADLs.  She had multiple encounters with her primary care providers for this. Her Xanax was increased from daily to two times a day about 1 week ago without improvement of her symptoms.  On 07/21/19, her urine culture showed "Beta hemolytic Streptococcus, group B" and she was continued on doxycycline.  She endorses intermittent nausea.  Denies fevers, chills, shortness of breath, cough, wheezing, chest pain/pressure, palpitations, vomiting, diarrhea, abdominal pain.  Denies dysuria or urinary frequency or urgency.  Patient came to [facility] ED for further evaluation.  In the emergency room, she was afebrile with pulse 59, RR 16, BP 100/73 and room air O2 sats 99%.  The labs showed nonrevealing CMP and CBC, glucose 103, pyuria. Psychiatry was consulted. She received ceftriaxone in the ED  Review of Systems: As per HPI otherwise 10 point review of systems negative.  Review of Systems Otherwise negative except as per HPI, including: General: Denies fever, chills, night sweats or unintended weight loss. Resp: Denies cough, wheezing, shortness of breath. Cardiac: Denies chest  pain, palpitations, orthopnea, paroxysmal nocturnal dyspnea. GI: Denies abdominal pain,  vomiting, diarrhea or constipation.  Positive for nausea GU: Denies dysuria, frequency, hesitancy or incontinence MS: Denies muscle aches, joint pain or swelling Neuro: Denies headache, neurologic deficits (focal weakness, numbness, tingling), abnormal gait.  Positive cough confusion and anxiety Psych: Denies depression, SI/HI/AVH Skin: Denies new rashes or lesions ID: Denies sick contacts, exotic exposures, travel  Past Medical History:  Diagnosis Date  . Colon polyp 2015  . Farsightedness    wears glasses  . Urinary tract infection     Past Surgical History:  Procedure Laterality Date  . BREAST REDUCTION SURGERY    . CARPAL TUNNEL RELEASE     right  . COLONOSCOPY  [address]   [place], [place]  . lichenoid keratosis  09/05/11   Skin, left anterior shouldee  . PARTIAL HYSTERECTOMY     still has ovaries  . WISDOM TOOTH EXTRACTION      SOCIAL HISTORY:  reports that she has never smoked. She has never used smokeless tobacco. She reports current alcohol use of about 5.0 standard drinks of alcohol per week. She reports that she does not use drugs.  No Known Allergies  FAMILY HISTORY: Family History  Problem Relation Age of Onset  . Alzheimer's disease Mother   . Heart disease Neg Hx   . Diabetes Neg Hx   . Cancer Neg Hx   . Stroke Neg Hx   . Hypertension Neg Hx   . Hyperlipidemia Neg Hx      Prior to Admission medications   Medication Sig Start Date End Date Taking? Authorizing  Provider  ALPRAZolam Duanne Moron) 0.5 MG tablet Take 1 tablet (0.5 mg total) by mouth 2 (two) times daily as needed for up to 20 days for anxiety. Patient taking differently: Take 0.5 mg by mouth in the morning, at noon, in the evening, and at bedtime.  08/03/19 08/23/19 Yes Tysinger, Camelia Eng, PA-C  cloNIDine (CATAPRES) 0.1 MG tablet Take 0.1 mg by mouth 2 (two) times daily. 08/09/19  Yes [provider]  naproxen  (NAPROSYN) 375 MG tablet Take 375 mg by mouth 3 (three) times daily as needed for mild pain.   Yes [provider]  omeprazole (PRILOSEC) 40 MG capsule Take 1 capsule by mouth once daily 11/12/18  Yes Tysinger, Camelia Eng, PA-C  ondansetron (ZOFRAN) 4 MG tablet 1 tablet po q4 hours prn Patient taking differently: Take 4 mg by mouth every 4 (four) hours as needed (panic attack). 1 tablet po q4 hours prn 07/22/19  Yes Tysinger, Camelia Eng, PA-C  promethazine (PHENERGAN) 25 MG tablet Take 25 mg by mouth every 6 (six) hours as needed for nausea.  07/13/19  Yes [provider]  propranolol (INDERAL) 10 MG tablet Take 1 tablet (10 mg total) by mouth 2 (two) times daily. 07/22/19 07/21/20 Yes Tysinger, Camelia Eng, PA-C  Vitamin D, Ergocalciferol, (DRISDOL) 1.25 MG (50000 UNIT) CAPS capsule Take 1 capsule (50,000 Units total) by mouth every 7 (seven) days. 07/22/19  Yes Tysinger, Camelia Eng, PA-C  hydrOXYzine (VISTARIL) 25 MG capsule Take 25 mg by mouth 3 (three) times daily as needed for anxiety.  07/13/19   [provider]    Physical Exam: Vitals:   08/12/19 0348 08/12/19 0458 08/12/19 0700 08/12/19 0755  BP: 116/88 133/72 109/70 (!) 144/84  Pulse: 64 (!) 55 86 65  Resp: 20 18  16   Temp: (!) 97.5 F (36.4 C)     TempSrc: Oral     SpO2: 98% 98% 100% 100%  Weight:      Height:          Constitutional: Anxious.  Crying.  No acute distress. Eyes: PERRL, lids and conjunctivae normal ENMT: Mucous membranes are moist. Posterior pharynx clear of any exudate or lesions.Normal dentition.  Neck: normal, supple, no masses, no thyromegaly Respiratory: clear to auscultation bilaterally, no wheezing, no crackles. Normal respiratory effort. No accessory muscle use.  Cardiovascular: Regular rate and rhythm, no murmurs / rubs / gallops. No extremity edema. 2+ pedal pulses. No carotid bruits.  Abdomen: no tenderness, no masses palpated. No hepatosplenomegaly. Bowel sounds positive.  Musculoskeletal:  no clubbing / cyanosis. No joint deformity upper and lower extremities. Good ROM, no contractures. Normal muscle tone.  Skin: no rashes, lesions, ulcers. No induration Neurologic: CN 2-12 grossly intact. Sensation intact, DTR normal. Strength 5/5 in all 4.  Psychiatric: Poor judgment and insight. Alert and oriented x 3. Normal mood.     Labs on Admission: I have personally reviewed following labs and imaging studies  CBC: Recent Labs  Lab 08/11/19 1142  WBC 7.7  NEUTROABS 5.1  HGB 15.4*  HCT 46.1*  MCV 94.5  PLT 782   Basic Metabolic Panel: Recent Labs  Lab 08/11/19 1142  Daisa Stennis 140  K 4.2  CL 103  CO2 26  GLUCOSE 103*  BUN 22  CREATININE 0.93  CALCIUM 9.4   GFR: Estimated Creatinine Clearance: 63.2 mL/min (by C-G formula based on SCr of 0.93 mg/dL). Liver Function Tests: Recent Labs  Lab 08/11/19 1142  AST 18  ALT 15  ALKPHOS 76  BILITOT 0.8  PROT 6.7  ALBUMIN 4.1   No results for input(s): LIPASE, AMYLASE in the last 168 hours. No results for input(s): AMMONIA in the last 168 hours. Coagulation Profile: No results for input(s): INR, PROTIME in the last 168 hours. Cardiac Enzymes: No results for input(s): CKTOTAL, CKMB, CKMBINDEX, TROPONINI in the last 168 hours. BNP (last 3 results) No results for input(s): PROBNP in the last 8760 hours. HbA1C: No results for input(s): HGBA1C in the last 72 hours. CBG: No results for input(s): GLUCAP in the last 168 hours. Lipid Profile: No results for input(s): CHOL, HDL, LDLCALC, TRIG, CHOLHDL, LDLDIRECT in the last 72 hours. Thyroid Function Tests: No results for input(s): TSH, T4TOTAL, FREET4, T3FREE, THYROIDAB in the last 72 hours. Anemia Panel: No results for input(s): VITAMINB12, FOLATE, FERRITIN, TIBC, IRON, RETICCTPCT in the last 72 hours. Urine analysis:    Component Value Date/Time   COLORURINE YELLOW 08/11/2019 1142   APPEARANCEUR HAZY (A) 08/11/2019 1142   LABSPEC 1.015 08/11/2019 1142   LABSPEC 1.015  07/21/2019 1120   PHURINE 5.0 08/11/2019 1142   GLUCOSEU NEGATIVE 08/11/2019 1142   HGBUR NEGATIVE 08/11/2019 1142   BILIRUBINUR NEGATIVE 08/11/2019 1142   BILIRUBINUR negative 07/21/2019 1120   BILIRUBINUR NEG 11/26/2011 0931   KETONESUR 5 (A) 08/11/2019 1142   PROTEINUR NEGATIVE 08/11/2019 1142   UROBILINOGEN negative 11/26/2011 0931   UROBILINOGEN 0.2 03/25/2011 0633   NITRITE NEGATIVE 08/11/2019 1142   LEUKOCYTESUR LARGE (A) 08/11/2019 1142   Sepsis Labs: !!!!!!!!!!!!!!!!!!!!!!!!!!!!!!!!!!!!!!!!!!!! @LABRCNTIP (procalcitonin:4,lacticidven:4) ) Recent Results (from the past 240 hour(s))  SARS Coronavirus 2 by RT PCR (hospital order, performed in [place] hospital lab) Nasopharyngeal Nasopharyngeal Swab     Status: None   Collection Time: 08/11/19 11:42 AM   Specimen: Nasopharyngeal Swab  Result Value Ref Range Status   SARS Coronavirus 2 NEGATIVE NEGATIVE Final    Comment: (NOTE) SARS-CoV-2 target nucleic acids are NOT DETECTED.  The SARS-CoV-2 RNA is generally detectable in upper and lower respiratory specimens during the acute phase of infection. The lowest concentration of SARS-CoV-2 viral copies this assay can detect is 250 copies / mL. A negative result does not preclude SARS-CoV-2 infection and should not be used as the sole basis for treatment or other patient management decisions.  A negative result may occur with improper specimen collection / handling, submission of specimen other than nasopharyngeal swab, presence of viral mutation(s) within the areas targeted by this assay, and inadequate number of viral copies (<250 copies / mL). A negative result must be combined with clinical observations, patient history, and epidemiological information.  Fact Sheet for Patients:   StrictlyIdeas.no  Fact Sheet for Healthcare Providers: BankingDealers.co.za  This test is not yet approved or  cleared by the [place] FDA  and has been authorized for detection and/or diagnosis of SARS-CoV-2 by FDA under an Emergency Use Authorization (EUA).  This EUA will remain in effect (meaning this test can be used) for the duration of the COVID-19 declaration under Section 564(b)(1) of the Act, 21 U.S.C. section 360bbb-3(b)(1), unless the authorization is terminated or revoked sooner.  Performed at [facility], [place] [address], [place], [place] [address]      Radiological Exams on Admission: MR BRAIN WO CONTRAST  Result Date: 08/11/2019 CLINICAL DATA:  Encephalopathy EXAM: MRI HEAD WITHOUT CONTRAST TECHNIQUE: Multiplanar, multiecho pulse sequences of the brain and surrounding structures were obtained without intravenous contrast. COMPARISON:  None. FINDINGS: Brain: There is no acute infarction or intracranial hemorrhage. There  is no intracranial mass, mass effect, or edema. There is no hydrocephalus or extra-axial fluid collection. Ventricles and sulci are normal in size and configuration. Patchy foci of T2 hyperintensity in the supratentorial white matter are nonspecific but may reflect mild chronic microvascular ischemic changes. Vascular: Major vessel flow voids at the skull base are preserved. Skull and upper cervical spine: Normal marrow signal is preserved. Sinuses/Orbits: Trace paranasal sinus mucosal thickening. Orbits are unremarkable. Other: Sella is unremarkable.  Mastoid air cells are clear. IMPRESSION: No evidence of recent infarction, hemorrhage, or mass. Mild chronic microvascular ischemic changes. Electronically Signed   By: Macy Mis M.D.   On: 08/11/2019 14:00     All images have been reviewed by me personally.  EKG: Independently reviewed.   Assessment/Plan Principal Problem:   Altered mental state Active Problems:   Anxiety   Sleep disturbance   Disorientated   Vitamin D deficiency   RMSF [facility] spotted fever)   UTI (urinary tract infection)   Grief     Assessment  plan  #Altered mental status #Urinary tract infection # sleep disturbance  Patient reports gradual onset intermittent panic attacks and confusion over last month. The etiology could be multifactorial including grief and anxiety for recent father passed away, untreated UTI and ? Anxiety from recent tick bite   - obs - MRI brain showed no acute intracranial changes. -Urine culture pending -Empiric ceftriaxone -Continue Xanax -Psychiatry was consulted and appreciate their input   #Anxiety and intermittent panic attacks #Father passed away 2-3 months ago # sleep disturbance  -Patient was crying and appeared to be anxious when we were discussing her father's death.  - she is still going through grief based on my exam -Continue home Xanax - Ambien as needed -Appreciate psychiatry help   #suspected [facility] spotted fever after tick bite  Per PCP note on 07/27/19, she had  " positive antibody for [facility] spotted fever" -completed doxycycline per PCP - ED had sent Lyme and RMSF study last night  #Vitamin D deficiency  -Continue home vitamin D     Body mass index is 25.85 kg/m.   DVT prophylaxis: Lovenox subcu Code Status: Full code Family Communication: Husband Consults called: Psychiatry Admission status: Obvious medical bed  Status is: Observation  The patient remains OBS appropriate and will d/c before 2 midnights.  Dispo: The patient is from: Home              Anticipated d/c is to: Home              Anticipated d/c date is: 2 days              Patient currently admitted   Time Spent: 65 minutes.  >50% of the time was devoted to discussing the patients care, assessment, plan and disposition with other care givers along with counseling the patient about the risks and benefits of treatment.    Charlann Lange MD [facility]  If 7PM-7AM, please contact night-coverage   08/12/2019, 8:33 AM

## 2019-08-12 NOTE — BH Assessment (Signed)
Comprehensive Clinical Assessment (CCA) Note  08/12/2019 Hannah Padilla [id number]  Visit Diagnosis:      ICD-10-CM   1. Anxiety  F41.9      Pt presents to [facility] for altered mental status and panic attacks, per EDP, Gradual onset of symptoms over the last month, not improving despite multiple encounters with primary care providers.  Her husband is concerned about her change in behavior and would like to have her "admitted to be managed by a single provider."  He is concerned that his wife is very different from his usual, is unable to perform all of her ADLs, is confused, and seems to be getting worse.  She was diagnosed and treated with doxycycline for suspected [facility] spotted fever, after a tick bite about 5 weeks ago.  1 week ago her Xanax was increased from 2 to 4 times a day, without improvement of her symptoms of intermittent panic attacks.  There is been no fever, chills, nausea, vomiting, cough or shortness of breath within the last week.  Patient has never had similar problems, in the past."   During assessment pt denies SI, HI, AVH and SIB. Pt has no prior psychiatric inpatient treatment history. Pt has never had a provider or been prescribed psychiatric medications. Pt reports she has been having panic attacks weekly, no none triggers. Pt reports she is not sure what is causing the attacks, she states she was bitten by a tick a month ago and her behavior started to change. She states she has been confused more and experiencing memory issues.  Pt does endorse symptoms of anxiety such as difficulty concentrating, restlessness, worrying. Pt reports getting 6 hours of sleep daily and a good appetite, no weight changes. Pt states she is still grieving her father passed away 10 years ago and she had a weird vision and seen/heard him today after taking her medications but no AVH or history of AVH. Pt's husband reports pt has not been herself last month really concerned about her confusion, memory  loss after tick bite. He states multiple test have been ran including UTI which is ruled out. Pt reports no history of trauma/abuse/ mental health in family and no current stressors. Pt denies drug use, lab positive for benzos due to prescribed medication from her PCP. Pt reports she would be open to taking medications for anxiety.  Diagnosis:Altered Mental Status  Disposition: Talbot Grumbling, FNP recommends pt is psych cleared. TTS provided grievance counseling resources. PER MD DR Randal Buba pt is to be medically admitted. CCA Screening, Triage and Referral (STR)  Patient Reported Information How did you hear about [place]? Family/Friend  Referral name: Husband/ [name]  Referral phone number: No data recorded  Whom do you see for routine medical problems? Primary Care  Practice/Facility Name: [facility]  Practice/Facility Phone Number: No data recorded Name of Contact: No data recorded Contact Number: No data recorded Contact Fax Number: No data recorded Prescriber Name: No data recorded Prescriber Address (if known): No data recorded  What Is the Reason for Your Visit/Call Today? No data recorded How Long Has This Been Causing You Problems? 1 wk - 1 month  What Do You Feel Would Help You the Most Today? Assessment Only   Have You Recently Been in Any Inpatient Treatment (Hospital/Detox/Crisis Center/28-Day Program)? No  Name/Location of Program/Hospital:No data recorded How Long Were You There? No data recorded When Were You Discharged? No data recorded  Have You Ever Received Services From [facility] Before? Yes  Who Do You See at [facility]? Doctor   Have You Recently Had Any Thoughts About Hurting Yourself? No  Are You Planning to Commit Suicide/Harm Yourself At This time? No   Have you Recently Had Thoughts About [place]? No  Explanation: No data recorded  Have You Used Any Alcohol or Drugs in the Past 24 Hours?  No  How Long Ago Did You Use Drugs or Alcohol? No data recorded What Did You Use and How Much? No data recorded  Do You Currently Have a Therapist/Psychiatrist? No  Name of Therapist/Psychiatrist: No data recorded  Have You Been Recently Discharged From Any Office Practice or Programs? No  Explanation of Discharge From Practice/Program: No data recorded    CCA Screening Triage Referral Assessment Type of Contact: Face-to-Face  Is this Initial or Reassessment? No data recorded Date Telepsych consult ordered in [facility]:  No data recorded Time Telepsych consult ordered in [facility]:  No data recorded  Patient Reported Information Reviewed? Yes  Patient Left Without Being Seen? No data recorded Reason for Not Completing Assessment: No data recorded  Collateral Involvement: Husband   Does Patient Have a [place]? No data recorded Name and Contact of Legal Guardian: No data recorded If Minor and Not Living with Parent(s), Who has Custody? NA  Is CPS involved or ever been involved? Never  Is APS involved or ever been involved? Never   Patient Determined To Be At Risk for Harm To Self or Others Based on Review of Patient Reported Information or Presenting Complaint? No  Method: No data recorded Availability of Means: No data recorded Intent: No data recorded Notification Required: No data recorded Additional Information for Danger to Others Potential: No data recorded Additional Comments for Danger to Others Potential: No data recorded Are There Guns or Other Weapons in Your Home? No data recorded Types of Guns/Weapons: No data recorded Are These Weapons Safely Secured?                            No data recorded Who Could Verify You Are Able To Have These Secured: No data recorded Do You Have any Outstanding Charges, Pending Court Dates, Parole/Probation? No data recorded Contacted To Inform of Risk of Harm To Self or Others: No data recorded  Location of  Assessment: [facility] ED   Does Patient Present under Involuntary Commitment? No  IVC Papers Initial File Date: No data recorded  [place] of Residence: No data recorded  Patient Currently Receiving the Following Services: No data recorded  Determination of Need: No data recorded  Options For Referral: Other: Comment     CCA Biopsychosocial  Intake/Chief Complaint:  CCA Intake With Chief Complaint CCA Part Two Date: 08/12/19 CCA Part Two Time: 0209 Chief Complaint/Presenting Problem: anxiety, panic attacks Patient's Currently Reported Symptoms/Problems: panic attacks Individual's Strengths: NA Individual's Preferences: NA Individual's Abilities: NA Type of Services Patient Feels Are Needed: Assessment Initial Clinical Notes/Concerns: NA  Mental Health Symptoms Depression:  Depression: None  Mania:  Mania: None  Anxiety:   Anxiety: Difficulty concentrating, Restlessness, Sleep  Psychosis:  Psychosis: None  Trauma:  Trauma: None  Obsessions:  Obsessions: None  Compulsions:  Compulsions: None  Inattention:  Inattention: None  Hyperactivity/Impulsivity:  Hyperactivity/Impulsivity: N/A  Oppositional/Defiant Behaviors:  Oppositional/Defiant Behaviors: None  Emotional Irregularity:  Emotional Irregularity: None  Other Mood/Personality Symptoms:      Mental Status Exam Appearance and self-care  Stature:  Stature: Average  Weight:  Weight: Average weight  Clothing:  Clothing: Casual  Grooming:  Grooming: Normal  Cosmetic use:  Cosmetic Use: Age appropriate  Posture/gait:  Posture/Gait: Normal  Motor activity:  Motor Activity:  (Normal)  Sensorium  Attention:  Attention: Normal  Concentration:  Concentration: Normal  Orientation:  Orientation: Place, Person, Object, Situation, Time  Recall/memory:  Recall/Memory: Defective in Recent  Affect and Mood  Affect:  Affect: Other (Comment) (Pleasant)  Mood:  Mood: Euthymic  Relating  Eye contact:  Eye Contact: Normal  Facial  expression:     Attitude toward examiner:  Attitude Toward Examiner: Cooperative  Thought and Language  Speech flow: Speech Flow: Clear and Coherent  Thought content:  Thought Content: Appropriate to Mood and Circumstances  Preoccupation:  Preoccupations: None  Hallucinations:  Hallucinations: None  Organization:     Transport planner of Knowledge:  Fund of Knowledge: Good  Intelligence:  Intelligence: Average  Abstraction:  Abstraction: Normal  Judgement:  Judgement: Good  Reality Testing:  Reality Testing: Adequate  Insight:  Insight: Good  Decision Making:     Social Functioning  Social Maturity:     Social Judgement:     Stress  Stressors:  Stressors: Other (Comment)  Coping Ability:     Skill Deficits:  Skill Deficits: Activities of daily living  Supports:  Supports: Family     Religion: Religion/Spirituality Are You A Religious Person?: Yes How Might This Affect Treatment?: NA  Leisure/Recreation: Leisure / Recreation Do You Have Hobbies?: Yes Leisure and Hobbies: [employer]  Exercise/Diet: Exercise/Diet Do You Exercise?: No Do You Have Any Trouble Sleeping?: Yes Explanation of Sleeping Difficulties: anxiety   CCA Employment/Education  Employment/Work Situation: Employment / Work Situation 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 a held a job?: NA Where was the patient employed at that time?: NA Has patient ever been in the [employer]?: No  Education: Education Is Patient Currently Attending School?: No Last Grade Completed: 12 Name of High School: [employer] Did Teacher, adult education From [employer]?: Yes Did Physicist, medical?: Yes What Type of College Degree Do you Have?: [employer] Did [place]?: Yes Did You Have An Individualized Education Program (IIEP): No Did You Have Any Difficulty At School?: No Patient's Education Has Been Impacted by Current Illness: No   CCA Family/Childhood History  Family  and Relationship History: Family history Marital status: Married Number of Years Married: 76 (25) What types of issues is patient dealing with in the relationship?: anxiety What is your sexual orientation?: unk Has your sexual activity been affected by drugs, alcohol, medication, or emotional stress?: unknown  Childhood History:  Childhood History Does patient have siblings?: No Did patient suffer any verbal/emotional/physical/sexual abuse as a child?: No Did patient suffer from severe childhood neglect?: No Has patient ever been sexually abused/assaulted/raped as an adolescent or adult?: No Was the patient ever a victim of a crime or a disaster?: No Witnessed domestic violence?: No Has patient been affected by domestic violence as an adult?: No  Child/Adolescent Assessment:     CCA Substance Use  Alcohol/Drug Use: Alcohol / Drug Use History of alcohol / drug use?: No history of alcohol / drug abuse                 DSM5 Diagnoses: Patient Active Problem List   Diagnosis Date Noted  . [facility] spotted fever) 07/27/2019  . Anxiety 07/21/2019  . Elevated lipase  07/21/2019  . Abnormal thyroid blood test 07/21/2019  . Sleep disturbance 07/21/2019  . Disorientated 07/21/2019  . Hypokalemia 07/21/2019  . Leukocytosis 07/21/2019  . Tremor 07/21/2019  . Vitamin D deficiency 07/21/2019  . Palpitation 07/21/2019  . Vaginal bleeding 07/08/2018  . Encounter for hepatitis C screening test for low risk patient 07/08/2018  . Chronic nausea 07/08/2018  . Screening for cervical cancer 07/08/2018  . Vaginal mass 07/08/2018  . Gastroesophageal reflux disease 07/08/2018  . Vaccine counseling 07/08/2018  . Estrogen deficiency 07/08/2018  . Post-menopausal 07/08/2018  . Need for pneumococcal vaccination 07/08/2018

## 2019-08-12 NOTE — Plan of Care (Signed)

## 2019-08-12 NOTE — Consult Note (Addendum)
[facility] Consult   Reason for Consult:  "Consult for medication management" Referring Physician:  Dr. Eulis Foster Patient Identification: Hannah Padilla MRN:  [id number] Principal Diagnosis: Altered mental state Diagnosis:  Principal Problem:   Altered mental state Active Problems:   Anxiety   Sleep disturbance   Disorientated   Vitamin D deficiency   [facility] spotted fever)   UTI (urinary tract infection)   Grief   Total Time spent with patient: 45 minutes  Subjective: Patient states "I can see tiny bugs on the floor."  HPI: Hannah Padilla is a 69 y.o. female patient admitted with confusion and anxiety. Psychiatry consult completed by nurse practitioner.  Patient alert to self only currently.  Patient request that husband, Hannah Padilla, remain at bedside during assessment.  Patient ask that Hannah Padilla participate in assessment. Patient denies suicidal and homicidal ideations.  Patient denies auditory hallucinations.  Patient denies symptoms of paranoia. Prior to my arrival in hospital room, patient had pointed out tiny dark specks in the flooring that she believed "look like they are moving, look like tiny bugs."  Patient does not appear distressed by visual hallucinations. Per husband patient sleeps 8 hours or more each night.  Husband reports patient's appetite is average. Shares she lives in [place] with her husband.  Patient denies access to weapons.  Patient reports she is currently retired. Patient's husband describes patient's behavior became "more confused and disoriented beginning on July 10, 2019."  Patient and husband deny current alcohol use.  Per husband patient has history of alcohol use disorder.  Patient's husband reports 1 episode of delirium tremens in the past.  Per husband patient has not consumed alcohol in approximately 1 year. Patient and husband deny any history of mental health diagnoses prior to June 2021.  Patient is not  currently followed by outpatient psychiatry. Patient's husband verbalizes concerns regarding current medications.  Patient's husband states "we would like the least amount of medications to prevent an onset of anxiety."    Past Psychiatric History: Anxiety  Risk to Self:   Denies Risk to Others:   Denies Prior Inpatient Therapy:   Denies Prior Outpatient Therapy:   Denies  Past Medical History:  Past Medical History:  Diagnosis Date  . Colon polyp 2015  . Farsightedness    wears glasses  . Urinary tract infection     Past Surgical History:  Procedure Laterality Date  . BREAST REDUCTION SURGERY    . CARPAL TUNNEL RELEASE     right  . COLONOSCOPY  5784   [place], [place]  . lichenoid keratosis  09/05/11   Skin, left anterior shouldee  . PARTIAL HYSTERECTOMY     still has ovaries  . WISDOM TOOTH EXTRACTION     Family History:  Family History  Problem Relation Age of Onset  . Alzheimer's disease Mother   . Heart disease Neg Hx   . Diabetes Neg Hx   . Cancer Neg Hx   . Stroke Neg Hx   . Hypertension Neg Hx   . Hyperlipidemia Neg Hx    Family Psychiatric  History: Mother-alcohol use disorder Social History:  Social History   Substance and Sexual Activity  Alcohol Use Yes  . Alcohol/week: 5.0 standard drinks  . Types: 5 Glasses of wine per week   Comment: several days per week     Social History   Substance and Sexual Activity  Drug Use No    Social History   Socioeconomic  History  . Marital status: Married    Spouse name: Not on file  . Number of children: Not on file  . Years of education: Not on file  . Highest education level: Not on file  Occupational History  . Not on file  Tobacco Use  . Smoking status: Never Smoker  . Smokeless tobacco: Never Used  Vaping Use  . Vaping Use: Never used  Substance and Sexual Activity  . Alcohol use: Yes    Alcohol/week: 5.0 standard drinks    Types: 5 Glasses of wine per week    Comment: several days per week  .  Drug use: No  . Sexual activity: Not Currently    Birth control/protection: Post-menopausal    Comment: 1st intercourse 69 yo-More than 5 partners  Other Topics Concern  . Not on file  Social History Narrative   Married, no children.  Retired, was working for [employer] as a Geophysicist/field seismologist.   Exercise - not much.   07/2018.     Social Determinants of Health   Financial Resource Strain:   . Difficulty of Paying Living Expenses:   Food Insecurity:   . Worried About Charity fundraiser in the Last Year:   . Arboriculturist in the Last Year:   Transportation Needs:   . Film/video editor (Medical):   Marland Kitchen Lack of Transportation (Non-Medical):   Physical Activity:   . Days of Exercise per Week:   . Minutes of Exercise per Session:   Stress:   . Feeling of Stress :   Social Connections:   . Frequency of Communication with Friends and Family:   . Frequency of Social Gatherings with Friends and Family:   . Attends Religious Services:   . Active Member of Clubs or Organizations:   . Attends Archivist Meetings:   Marland Kitchen Marital Status:    Additional Social History:    Allergies:  No Known Allergies  Labs:  Results for orders placed or performed during the hospital encounter of 08/11/19 (from the past 48 hour(s))  Comprehensive metabolic panel     Status: Abnormal   Collection Time: 08/11/19 11:42 AM  Result Value Ref Range   Sodium 140 135 - 145 mmol/L   Potassium 4.2 3.5 - 5.1 mmol/L   Chloride 103 98 - 111 mmol/L   CO2 26 22 - 32 mmol/L   Glucose, Bld 103 (H) 70 - 99 mg/dL    Comment: Glucose reference range applies only to samples taken after fasting for at least 8 hours.   BUN 22 8 - 23 mg/dL   Creatinine, Ser 0.93 0.44 - 1.00 mg/dL   Calcium 9.4 8.9 - 10.3 mg/dL   Total Protein 6.7 6.5 - 8.1 g/dL   Albumin 4.1 3.5 - 5.0 g/dL   AST 18 15 - 41 U/L   ALT 15 0 - 44 U/L   Alkaline Phosphatase 76 38 - 126 U/L   Total Bilirubin 0.8 0.3 - 1.2 mg/dL   GFR calc non Af Amer  >60 >60 mL/min   GFR calc Af Amer >60 >60 mL/min   Anion gap 11 5 - 15    Comment: Performed at [facility], [place] [address] [place], [place] [address]  CBC with Differential     Status: Abnormal   Collection Time: 08/11/19 11:42 AM  Result Value Ref Range   WBC 7.7 4.0 - 10.5 K/uL   RBC 4.88 3.87 - 5.11 MIL/uL   Hemoglobin 15.4 (  H) 12.0 - 15.0 g/dL   HCT 46.1 (H) 36 - 46 %   MCV 94.5 80.0 - 100.0 fL   MCH 31.6 26.0 - 34.0 pg   MCHC 33.4 30.0 - 36.0 g/dL   RDW 12.1 11.5 - 15.5 %   Platelets 225 150 - 400 K/uL   nRBC 0.0 0.0 - 0.2 %   Neutrophils Relative % 66 %   Neutro Abs 5.1 1.7 - 7.7 K/uL   Lymphocytes Relative 25 %   Lymphs Abs 1.9 0.7 - 4.0 K/uL   Monocytes Relative 5 %   Monocytes Absolute 0.4 0 - 1 K/uL   Eosinophils Relative 3 %   Eosinophils Absolute 0.2 0 - 0 K/uL   Basophils Relative 1 %   Basophils Absolute 0.1 0 - 0 K/uL   Immature Granulocytes 0 %   Abs Immature Granulocytes 0.02 0.00 - 0.07 K/uL    Comment: Performed at [facility], [place] [address] [place], [place] [address]  Ethanol     Status: None   Collection Time: 08/11/19 11:42 AM  Result Value Ref Range   Alcohol, Ethyl (B) <10 <10 mg/dL    Comment: (NOTE) Lowest detectable limit for serum alcohol is 10 mg/dL.  For medical purposes only. Performed at [facility], [place] [address] [place], [place] [address]   Urine rapid drug screen (hosp performed)     Status: Abnormal   Collection Time: 08/11/19 11:42 AM  Result Value Ref Range   Opiates NONE DETECTED NONE DETECTED   Cocaine NONE DETECTED NONE DETECTED   Benzodiazepines POSITIVE (A) NONE DETECTED   Amphetamines NONE DETECTED NONE DETECTED   Tetrahydrocannabinol NONE DETECTED NONE DETECTED   Barbiturates NONE DETECTED NONE DETECTED    Comment: (NOTE) DRUG SCREEN FOR MEDICAL PURPOSES ONLY.  IF CONFIRMATION IS NEEDED FOR ANY PURPOSE, NOTIFY LAB WITHIN 5 DAYS.  LOWEST DETECTABLE  LIMITS FOR URINE DRUG SCREEN Drug Class                     Cutoff (ng/mL) Amphetamine and metabolites    1000 Barbiturate and metabolites    200 Benzodiazepine                 884 Tricyclics and metabolites     300 Opiates and metabolites        300 Cocaine and metabolites        300 THC                            50 Performed at [facility], [place] [address] [place],  [address]   Urinalysis, Routine w reflex microscopic     Status: Abnormal   Collection Time: 08/11/19 11:42 AM  Result Value Ref Range   Color, Urine YELLOW YELLOW   APPearance HAZY (A) CLEAR   Specific Gravity, Urine 1.015 1.005 - 1.030   pH 5.0 5.0 - 8.0   Glucose, UA NEGATIVE NEGATIVE mg/dL   Hgb urine dipstick NEGATIVE NEGATIVE   Bilirubin Urine NEGATIVE NEGATIVE   Ketones, ur 5 (A) NEGATIVE mg/dL   Protein, ur NEGATIVE NEGATIVE mg/dL   Nitrite NEGATIVE NEGATIVE   Leukocytes,Ua LARGE (A) NEGATIVE   RBC / HPF 11-20 0 - 5 RBC/hpf   WBC, UA >50 (H) 0 - 5 WBC/hpf   Bacteria, UA RARE (A) NONE SEEN   Squamous Epithelial / LPF 0-5 0 - 5   Mucus  PRESENT    Non Squamous Epithelial 0-5 (A) NONE SEEN    Comment: Performed at [facility], [place] [address], [place], [place] [address]  SARS Coronavirus 2 by RT PCR (hospital order, performed in [facility] hospital lab) Nasopharyngeal Nasopharyngeal Swab     Status: None   Collection Time: 08/11/19 11:42 AM   Specimen: Nasopharyngeal Swab  Result Value Ref Range   SARS Coronavirus 2 NEGATIVE NEGATIVE    Comment: (NOTE) SARS-CoV-2 target nucleic acids are NOT DETECTED.  The SARS-CoV-2 RNA is generally detectable in upper and lower respiratory specimens during the acute phase of infection. The lowest concentration of SARS-CoV-2 viral copies this assay can detect is 250 copies / mL. A negative result does not preclude SARS-CoV-2 infection and should not be used as the sole basis for treatment or other patient management  decisions.  A negative result may occur with improper specimen collection / handling, submission of specimen other than nasopharyngeal swab, presence of viral mutation(s) within the areas targeted by this assay, and inadequate number of viral copies (<250 copies / mL). A negative result must be combined with clinical observations, patient history, and epidemiological information.  Fact Sheet for Patients:   StrictlyIdeas.no  Fact Sheet for Healthcare Providers: BankingDealers.co.za  This test is not yet approved or  cleared by the [place] FDA and has been authorized for detection and/or diagnosis of SARS-CoV-2 by FDA under an Emergency Use Authorization (EUA).  This EUA will remain in effect (meaning this test can be used) for the duration of the COVID-19 declaration under Section 564(b)(1) of the Act, 21 U.S.C. section 360bbb-3(b)(1), unless the authorization is terminated or revoked sooner.  Performed at [facility], [place] [address], [place], [place] [address]     Current Facility-Administered Medications  Medication Dose Route Frequency Provider Last Rate Last Admin  . 0.9 %  sodium chloride infusion   Intravenous Continuous Charlann Lange, MD   Stopped at 08/11/19 1915  . acetaminophen (TYLENOL) tablet 650 mg  650 mg Oral Q6H PRN Nicoletta Dress, Na, MD       Or  . acetaminophen (TYLENOL) suppository 650 mg  650 mg Rectal Q6H PRN Nicoletta Dress, Na, MD      . ALPRAZolam Duanne Moron) tablet 0.5 mg  0.5 mg Oral BID PRN Nicoletta Dress, Na, MD      . cloNIDine (CATAPRES) tablet 0.1 mg  0.1 mg Oral BID Nicoletta Dress, Na, MD   0.1 mg at 08/12/19 1104  . enoxaparin (LOVENOX) injection 40 mg  40 mg Subcutaneous Q24H Li, Na, MD      . hydrOXYzine (ATARAX/VISTARIL) tablet 25 mg  25 mg Oral TID PRN Nicoletta Dress, Na, MD   25 mg at 08/11/19 2317  . ondansetron (ZOFRAN) tablet 4 mg  4 mg Oral Q6H PRN Nicoletta Dress, Na, MD       Or  . ondansetron (ZOFRAN) injection 4 mg  4 mg Intravenous Q6H PRN Nicoletta Dress,  Na, MD      . promethazine (PHENERGAN) tablet 25 mg  25 mg Oral Q6H PRN Nicoletta Dress, Na, MD   25 mg at 08/11/19 2316  . propranolol (INDERAL) tablet 10 mg  10 mg Oral BID Nicoletta Dress, Na, MD   10 mg at 08/12/19 1104  . Vitamin D (Ergocalciferol) (DRISDOL) capsule 50,000 Units  50,000 Units Oral Q7 days Nicoletta Dress, Na, MD      . zolpidem (AMBIEN) tablet 5 mg  5 mg Oral QHS PRN Charlann Lange, MD  Musculoskeletal: Strength & Muscle Tone: within normal limits Gait & Station: normal Patient leans: N/A  Psychiatric Specialty Exam: Physical Exam Vitals and nursing note reviewed.  Constitutional:      Appearance: She is well-developed.  HENT:     Head: Normocephalic.  Cardiovascular:     Rate and Rhythm: Normal rate.  Pulmonary:     Effort: Pulmonary effort is normal.  Neurological:     Mental Status: She is alert. She is disoriented.  Psychiatric:        Attention and Perception: Attention normal. She perceives visual hallucinations.        Mood and Affect: Mood normal.        Speech: Speech normal.        Behavior: Behavior normal. Behavior is cooperative.        Thought Content: Thought content normal.        Cognition and Memory: Memory is impaired.        Judgment: Judgment normal.     Review of Systems  Constitutional: Negative.   HENT: Negative.   Eyes: Negative.   Respiratory: Negative.   Cardiovascular: Negative.   Gastrointestinal: Negative.   Genitourinary: Negative.   Musculoskeletal: Negative.   Skin: Negative.   Neurological: Negative.   Psychiatric/Behavioral: Positive for confusion and hallucinations.    Blood pressure 121/79, pulse 67, temperature (!) 97.5 F (36.4 C), temperature source Oral, resp. rate 16, height 5\' 8"  (1.727 m), weight 77.1 kg, SpO2 100 %.Body mass index is 25.85 kg/m.  General Appearance: Casual  Eye Contact:  Fair  Speech:  Clear and Coherent and Normal Rate  Volume:  Normal  Mood:  Euthymic  Affect:  Congruent  Thought Process:  Coherent, Goal Directed  and Descriptions of Associations: Intact  Orientation:  Other:  Person only  Thought Content:  Logical  Suicidal Thoughts:  No  Homicidal Thoughts:  No  Memory:  Recent;   Poor  Judgement:  Impaired  Insight:  Fair  Psychomotor Activity:  Normal  Concentration:  Concentration: Fair  Recall:  Poor  Fund of Knowledge:  Good  Language:  Good  Akathisia:  No  Handed:  Right  AIMS (if indicated):     Assets:  Communication Skills Desire for Improvement Financial Resources/Insurance Housing Intimacy Leisure Time  ADL's:  Intact  Cognition:  WNL  Sleep:        Treatment Plan Summary: Patient reviewed with Dr. Dwyane Dee.  Patient is a 69 year old female, pleasant and cooperative during assessment.   Based on my assessment today, patient does not meet criteria for inpatient psychiatric treatment at this time. Patient may benefit from follow-up with outpatient psychiatry versus grief counseling provider.  Recommendation: Anxiety; -Continue home medications including Xanax 0.5 mg twice daily as needed and Vistaril 25 mg 3 times daily as needed. -Consider discontinue Ambien as this medication combined with Xanax can increase confusion. -Recommend consider neurology consult.  Disposition: No evidence of imminent risk to self or others at present.   Patient does not meet criteria for psychiatric inpatient admission. Supportive therapy provided about ongoing stressors.  Emmaline Kluver, FNP 08/12/2019 11:54 AM

## 2019-08-13 DIAGNOSIS — F419 Anxiety disorder, unspecified: Secondary | ICD-10-CM

## 2019-08-13 DIAGNOSIS — G9341 Metabolic encephalopathy: Secondary | ICD-10-CM | POA: Diagnosis present

## 2019-08-13 DIAGNOSIS — Z79899 Other long term (current) drug therapy: Secondary | ICD-10-CM | POA: Diagnosis not present

## 2019-08-13 DIAGNOSIS — F4321 Adjustment disorder with depressed mood: Secondary | ICD-10-CM | POA: Diagnosis present

## 2019-08-13 DIAGNOSIS — Z90711 Acquired absence of uterus with remaining cervical stump: Secondary | ICD-10-CM | POA: Diagnosis not present

## 2019-08-13 DIAGNOSIS — N390 Urinary tract infection, site not specified: Secondary | ICD-10-CM | POA: Diagnosis not present

## 2019-08-13 DIAGNOSIS — E559 Vitamin D deficiency, unspecified: Secondary | ICD-10-CM | POA: Diagnosis not present

## 2019-08-13 DIAGNOSIS — Z820 Family history of epilepsy and other diseases of the nervous system: Secondary | ICD-10-CM | POA: Diagnosis not present

## 2019-08-13 DIAGNOSIS — G479 Sleep disorder, unspecified: Secondary | ICD-10-CM | POA: Diagnosis present

## 2019-08-13 DIAGNOSIS — W57XXXA Bitten or stung by nonvenomous insect and other nonvenomous arthropods, initial encounter: Secondary | ICD-10-CM | POA: Diagnosis present

## 2019-08-13 DIAGNOSIS — B951 Streptococcus, group B, as the cause of diseases classified elsewhere: Secondary | ICD-10-CM | POA: Diagnosis present

## 2019-08-13 DIAGNOSIS — A770 Spotted fever due to Rickettsia rickettsii: Secondary | ICD-10-CM | POA: Diagnosis present

## 2019-08-13 DIAGNOSIS — F410 Panic disorder [episodic paroxysmal anxiety] without agoraphobia: Secondary | ICD-10-CM | POA: Diagnosis present

## 2019-08-13 DIAGNOSIS — Z20822 Contact with and (suspected) exposure to covid-19: Secondary | ICD-10-CM | POA: Diagnosis present

## 2019-08-13 DIAGNOSIS — Z86010 Personal history of colonic polyps: Secondary | ICD-10-CM | POA: Diagnosis not present

## 2019-08-13 LAB — CBC WITH DIFFERENTIAL/PLATELET
Abs Immature Granulocytes: 0.01 10*3/uL (ref 0.00–0.07)
Basophils Absolute: 0.1 10*3/uL (ref 0.0–0.1)
Basophils Relative: 1 %
Eosinophils Absolute: 0.2 10*3/uL (ref 0.0–0.5)
Eosinophils Relative: 4 %
HCT: 41.1 % (ref 36.0–46.0)
Hemoglobin: 14.1 g/dL (ref 12.0–15.0)
Immature Granulocytes: 0 %
Lymphocytes Relative: 39 %
Lymphs Abs: 2.3 10*3/uL (ref 0.7–4.0)
MCH: 32 pg (ref 26.0–34.0)
MCHC: 34.3 g/dL (ref 30.0–36.0)
MCV: 93.4 fL (ref 80.0–100.0)
Monocytes Absolute: 0.5 10*3/uL (ref 0.1–1.0)
Monocytes Relative: 8 %
Neutro Abs: 2.9 10*3/uL (ref 1.7–7.7)
Neutrophils Relative %: 48 %
Platelets: 196 10*3/uL (ref 150–400)
RBC: 4.4 MIL/uL (ref 3.87–5.11)
RDW: 12.1 % (ref 11.5–15.5)
WBC: 5.9 10*3/uL (ref 4.0–10.5)
nRBC: 0 % (ref 0.0–0.2)

## 2019-08-13 LAB — BASIC METABOLIC PANEL
Anion gap: 9 (ref 5–15)
BUN: 12 mg/dL (ref 8–23)
CO2: 24 mmol/L (ref 22–32)
Calcium: 9.3 mg/dL (ref 8.9–10.3)
Chloride: 109 mmol/L (ref 98–111)
Creatinine, Ser: 0.71 mg/dL (ref 0.44–1.00)
GFR calc Af Amer: >60 mL/min (ref >60)
GFR calc non Af Amer: >60 mL/min (ref >60)
Glucose, Bld: 104 mg/dL — ABNORMAL HIGH (ref 70–99)
Potassium: 4 mmol/L (ref 3.5–5.1)
Sodium: 142 mmol/L (ref 135–145)

## 2019-08-13 LAB — URINE CULTURE: Culture: 60000 — AB

## 2019-08-13 MED ORDER — ASPIRIN EFFERVESCENT 325 MG PO TBEF: 324.0000 mg | EFFERVESCENT_TABLET | Freq: Four times a day (QID) | ORAL | Status: DC | PRN

## 2019-08-13 MED ORDER — ALUM & MAG HYDROXIDE-SIMETH 200-200-20 MG/5ML PO SUSP: 30.0000 mL | Freq: Four times a day (QID) | ORAL | Status: DC | PRN

## 2019-08-13 MED ORDER — SORBITOL 70 % SOLN
30.0000 mL | Status: AC
  Administered 2019-08-13 (×2): 30 mL via ORAL
  Filled 2019-08-13 (×2): qty 30

## 2019-08-13 MED ORDER — PANTOPRAZOLE SODIUM 40 MG PO TBEC
40.0000 mg | DELAYED_RELEASE_TABLET | Freq: Every day | ORAL | Status: DC
  Administered 2019-08-13 – 2019-08-14 (×2): 40 mg via ORAL
  Filled 2019-08-13 (×2): qty 1

## 2019-08-13 MED ORDER — ALUM & MAG HYDROXIDE-SIMETH 200-200-20 MG/5ML PO SUSP
30.0000 mL | Freq: Once | ORAL | Status: DC
  Filled 2019-08-13: qty 30

## 2019-08-13 MED ORDER — LIDOCAINE VISCOUS HCL 2 % MT SOLN
15.0000 mL | Freq: Once | OROMUCOSAL | Status: DC
  Filled 2019-08-13: qty 15

## 2019-08-13 NOTE — TOC Initial Note (Signed)
Transition of Care [facility]) - Initial/Assessment Note    Patient Details  Name: Hannah Padilla MRN: [id number] Date of Birth: 1950-05-19  Transition of Care [facility]) CM/SW Contact:    Lia Hopping, [place] Phone Number: 08/13/2019, 10:52 AM  Clinical Narrative:     Patient admitted for confusion and anxiety.  Psychiatric Evaluation completed.   Re: Psychiatric Outpatient follow up.           CSW met with the patient at beside to discuss outpatient psychiatric services. Patient spouse present at beside, the patient agreeable for him to stay. CSW introduced role. Patient receptive to resources for follow up mental health outpatient services. CSW provided resources for the [facility] location and a list of additional behavioral health center in the [place] area.  Patient requests to make her own appointment, pt. Spouse supportive of this. She would  like to talk with her primary physician about her mental health options before choosing a location. Patient spouse reports the patient has a follow up appointment on July 13, with Dr. Glade Lloyd at [facility].  Information written on AVS.  Patient or spouse did not have any additional questions.  TOC staff signing off.   Expected Discharge Plan: Home/Self Care Barriers to Discharge: No Barriers Identified   Patient Goals and CMS Choice Patient states their goals for this hospitalization and ongoing recovery are:: to determine the case of the panic attacks CMS Medicare.gov Compare Post Acute Care list provided to:: Patient Choice offered to / list presented to : Patient  Expected Discharge Plan and Services Expected Discharge Plan: Home/Self Care In-house Referral: Clinical Social Work Discharge Planning Services: NA Post Acute Care Choice: NA Living arrangements for the past 2 months: Single Family Home                 DME Arranged: N/A DME Agency: NA       HH Arranged: NA [place] Agency: NA         Prior Living Arrangements/Services Living arrangements for the past 2 months: Single Family Home Lives with:: Spouse Patient language and need for interpreter reviewed:: No Do you feel safe going back to the place where you live?: Yes      Need for Family Participation in Patient Care: Yes (Comment) Care giver support system in place?: Yes (comment)   Criminal Activity/Legal Involvement Pertinent to Current Situation/Hospitalization: No - Comment as needed  Activities of Daily Living Home Assistive Devices/Equipment: Eyeglasses ADL Screening (condition at time of admission) Patient's cognitive ability adequate to safely complete daily activities?: Yes Is the patient deaf or have difficulty hearing?: No Does the patient have difficulty seeing, even when wearing glasses/contacts?: No Does the patient have difficulty concentrating, remembering, or making decisions?: Yes Patient able to express need for assistance with ADLs?: Yes Does the patient have difficulty dressing or bathing?: No Independently performs ADLs?: Yes (appropriate for developmental age) Does the patient have difficulty walking or climbing stairs?: No Weakness of Legs: None Weakness of Arms/Hands: None  Permission Sought/Granted Permission sought to share information with : Family Supports Permission granted to share information with : Yes, Verbal Permission Granted              Emotional Assessment Appearance:: Appears stated age   Affect (typically observed): Sad, Accepting Orientation: : Oriented to Self, Oriented to Place, Oriented to  Time, Oriented to Situation Alcohol / Substance Use: Not Applicable Psych Involvement: No (comment), Yes (comment) (Recommendation for Outpatient Care Services)  Admission diagnosis:  Anxiety [F41.9] UTI (urinary tract infection) [N39.0] Urinary tract infection without hematuria, site unspecified [N39.0] Altered mental status, unspecified altered mental status type  [R41.82] Patient Active Problem List   Diagnosis Date Noted  . UTI (urinary tract infection) 08/12/2019  . Altered mental state 08/12/2019  . Grief 08/12/2019  . [facility] spotted fever) 07/27/2019  . Anxiety 07/21/2019  . Elevated lipase 07/21/2019  . Abnormal thyroid blood test 07/21/2019  . Sleep disturbance 07/21/2019  . Disorientated 07/21/2019  . Hypokalemia 07/21/2019  . Leukocytosis 07/21/2019  . Tremor 07/21/2019  . Vitamin D deficiency 07/21/2019  . Palpitation 07/21/2019  . Vaginal bleeding 07/08/2018  . Encounter for hepatitis C screening test for low risk patient 07/08/2018  . Chronic nausea 07/08/2018  . Screening for cervical cancer 07/08/2018  . Vaginal mass 07/08/2018  . Gastroesophageal reflux disease 07/08/2018  . Vaccine counseling 07/08/2018  . Estrogen deficiency 07/08/2018  . Post-menopausal 07/08/2018  . Need for pneumococcal vaccination 07/08/2018   PCP:  Carlena Hurl, PA-C Pharmacy:   [facility] [address] [address] [place] [address] Phone: [phone] Fax: [phone]  [id number] [place] [address] [place] [address]Phone: [phone]: [phone] [id number] [place]: [phone]: [phone]   Social Determinants of Health (SDOH) Interventions    Readmission Risk Interventions No flowsheet data found.

## 2019-08-13 NOTE — Progress Notes (Signed)
PROGRESS NOTE    Hannah Padilla  GNF:[id number] DOB: 01/01/1951 DOA: 08/11/2019 PCP: Carlena Hurl, PA-C    Chief Complaint  Patient presents with  . Altered Mental Status  . Panic Attack  . Agitation    Brief Narrative:  HPI per Dr. Letta Kocher is a 69 y.o. female with medical history significant of previous urine tract infection, vitamin D deficiency, colon polyps who presented with confusion and anxiety.  Patient is a poor historian and H&P is obtained by chart review, talking to patient and ED staff.  Patient reports that her father passed away about 2-3 months ago, and she recently had tick exposure and was started on doxycycline about 5 weeks ago.   She apparently started to have gradual onset of intermittent panic attacks over last month.  Per chart review, her husband was concerned that the patient was confused, very different from her usual, and unable to perform all of her ADLs.  She had multiple encounters with her primary care providers for this. Her Xanax was increased from daily to two times a day about 1 week ago without improvement of her symptoms.  On 07/21/19, her urine culture showed "Beta hemolytic Streptococcus, group B" and she was continued on doxycycline.  She endorses intermittent nausea.  Denies fevers, chills, shortness of breath, cough, wheezing, chest pain/pressure, palpitations, vomiting, diarrhea, abdominal pain.  Denies dysuria or urinary frequency or urgency.  Patient came to [facility] ED for further evaluation.  In the emergency room, she was afebrile with pulse 59, RR 16, BP 100/73 and room air O2 sats 99%.  The labs showed nonrevealing CMP and CBC, glucose 103, pyuria. Psychiatry was consulted. She received ceftriaxone in the ED  Assessment & Plan:   Principal Problem:   Altered mental state Active Problems:   Anxiety   Sleep disturbance   Disorientated   Vitamin D deficiency   RMSF [facility] spotted fever)   UTI (urinary tract infection)    Grief   Acute metabolic encephalopathy  1 acute metabolic encephalopathy Questionable etiology.  Likely secondary to probable UTI in the setting of anxiety.  Patient with clinical improvement per husband however still with bouts of anxiety.  Patient stated had a panic attack this morning.  Patient alert to self place and time.  Urine cultures pending.  MRI brain negative for any acute abnormalities.  Patient with no focal neurological deficits.  Continue IV Rocephin pending urine culture results.  Continue Xanax as needed and hydroxyzine as needed per psych recommendations.  Status post completion of full course of doxycycline for suspected [facility] spotted fever after a tick bite in the outpatient setting.  IV fluids.  No need for neurology consultation at this time as patient with no focal neurological symptoms, MRI negative, improving clinically.  Supportive care.  2.  Anxiety and intermittent panic attacks Patient noted to have a bout of panic attack this morning however some improvement after receiving anxiolytics.  Patient states father passed away approximately 7 years ago and as such not going through grief at this time.  Patient seen in consultation by psychiatry who do not feel patient needs inpatient treatment and are recommending 0.5 mg twice daily as needed, Vistaril as needed and discontinuation or decreasing Ambien.  Ambien has been discontinued.  Continue propranolol.  Will need outpatient follow-up with psychiatry for further medication titration and management of anxiety/panic attacks.  No need for neurology consultation at this time.  3.  Suspected [place] spotted  fever after tick bite Completed course of doxycycline per PCP.  ED had sent Lyme and RMSF titers yesterday.  Patient afebrile.  Outpatient follow-up with PCP.  4.  Vitamin D deficiency Continue home regimen of vitamin D.  5.  UTI Urine cultures pending.  Continue IV Rocephin.   DVT prophylaxis: Lovenox Code  Status: Full Family Communication: Updated patient and husband at bedside. Disposition:   Status is: Inpatient    Dispo: The patient is from: Home              Anticipated d/c is to: Home              Anticipated d/c date is: 1 to 2 days.              Patient currently on IV antibiotics for UTI, being treated for confusion/altered mental status.  Patient also with significant anxiety.  Not stable for discharge.       Consultants:   Psychiatry: Dr.Archana 08/12/2019  Procedures:   MRI brain 08/12/2019    Antimicrobials:   IV Rocephin 08/12/2019   Subjective: Patient laying in bed.  Patient alert to self place and time.  Denies any chest pain.  No shortness of breath.  States had a panic attack this morning.  Husband at bedside.  Per husband patient with improvement with confusion.  Objective: Vitals:   08/12/19 2122 08/13/19 0510 08/13/19 1310 08/13/19 1455  BP: (!) 99/58 114/71 (!) 131/91 129/90  Pulse: (!) 56 65 68 72  Resp: 16 16    Temp: 97.9 F (36.6 C) 97.7 F (36.5 C) 97.6 F (36.4 C)   TempSrc: Oral Oral Oral   SpO2: 99% 99% 100%   Weight:      Height:        Intake/Output Summary (Last 24 hours) at 08/13/2019 1529 Last data filed at 08/13/2019 1337 Gross per 24 hour  Intake 3368.09 ml  Output 3000 ml  Net 368.09 ml   Filed Weights   08/11/19 1018  Weight: 77.1 kg    Examination:  General exam: NAD Respiratory system: Clear to auscultation. Respiratory effort normal. Cardiovascular system: S1 & S2 heard, RRR. No JVD, murmurs, rubs, gallops or clicks. No pedal edema. Gastrointestinal system: Abdomen is nondistended, soft and nontender. No organomegaly or masses felt. Normal bowel sounds heard. Central nervous system: Alert and oriented. No focal neurological deficits. Extremities: Symmetric 5 x 5 power. Skin: No rashes, lesions or ulcers Psychiatry: Judgement and insight appear normal. Mood & affect appropriate.     Data Reviewed: I have  personally reviewed following labs and imaging studies  CBC: Recent Labs  Lab 08/11/19 1142 08/13/19 0449  WBC 7.7 5.9  NEUTROABS 5.1 2.9  HGB 15.4* 14.1  HCT 46.1* 41.1  MCV 94.5 93.4  PLT 225 202    Basic Metabolic Panel: Recent Labs  Lab 08/11/19 1142 08/13/19 0449  NA 140 142  K 4.2 4.0  CL 103 109  CO2 26 24  GLUCOSE 103* 104*  BUN 22 12  CREATININE 0.93 0.71  CALCIUM 9.4 9.3    GFR: Estimated Creatinine Clearance: 73.5 mL/min (by C-G formula based on SCr of 0.71 mg/dL).  Liver Function Tests: Recent Labs  Lab 08/11/19 1142  AST 18  ALT 15  ALKPHOS 76  BILITOT 0.8  PROT 6.7  ALBUMIN 4.1    CBG: No results for input(s): GLUCAP in the last 168 hours.   Recent Results (from the past 240 hour(s))  SARS Coronavirus 2  by RT PCR (hospital order, performed in [facility] hospital lab) Nasopharyngeal Nasopharyngeal Swab     Status: None   Collection Time: 08/11/19 11:42 AM   Specimen: Nasopharyngeal Swab  Result Value Ref Range Status   SARS Coronavirus 2 NEGATIVE NEGATIVE Final    Comment: (NOTE) SARS-CoV-2 target nucleic acids are NOT DETECTED.  The SARS-CoV-2 RNA is generally detectable in upper and lower respiratory specimens during the acute phase of infection. The lowest concentration of SARS-CoV-2 viral copies this assay can detect is 250 copies / mL. A negative result does not preclude SARS-CoV-2 infection and should not be used as the sole basis for treatment or other patient management decisions.  A negative result may occur with improper specimen collection / handling, submission of specimen other than nasopharyngeal swab, presence of viral mutation(s) within the areas targeted by this assay, and inadequate number of viral copies (<250 copies / mL). A negative result must be combined with clinical observations, patient history, and epidemiological information.  Fact Sheet for Patients:   StrictlyIdeas.no  Fact  Sheet for Healthcare Providers: BankingDealers.co.za  This test is not yet approved or  cleared by the [place] FDA and has been authorized for detection and/or diagnosis of SARS-CoV-2 by FDA under an Emergency Use Authorization (EUA).  This EUA will remain in effect (meaning this test can be used) for the duration of the COVID-19 declaration under Section 564(b)(1) of the Act, 21 U.S.C. section 360bbb-3(b)(1), unless the authorization is terminated or revoked sooner.  Performed at [facility], [place] [address], [place], [place] [address]   Urine Culture     Status: Abnormal   Collection Time: 08/11/19 11:42 AM   Specimen: Urine, Clean Catch  Result Value Ref Range Status   Specimen Description   Final    URINE, CLEAN CATCH Performed at [facility], [place] [address] [place], [place] [address]    Special Requests   Final    NONE Performed at [facility], [place] [address], [place], [place] [address]    Culture (A)  Final    60,000 COLONIES/mL GROUP B STREP(S.AGALACTIAE)ISOLATED TESTING AGAINST S. AGALACTIAE NOT ROUTINELY PERFORMED DUE TO PREDICTABILITY OF AMP/PEN/VAN SUSCEPTIBILITY. Performed at [facility], [place] [address], [place], [place] [address]    Report Status 08/13/2019 FINAL  Final         Radiology Studies: No results found.      Scheduled Meds: . alum & mag hydroxide-simeth  30 mL Oral Once   And  . lidocaine  15 mL Oral Once  . enoxaparin (LOVENOX) injection  40 mg Subcutaneous Q24H  . pantoprazole  40 mg Oral Daily  . propranolol  10 mg Oral BID  . sorbitol  30 mL Oral Q3H  . Vitamin D (Ergocalciferol)  50,000 Units Oral Q7 days   Continuous Infusions: . cefTRIAXone (ROCEPHIN)  IV 1 g (08/13/19 0602)     LOS: 0 days    Time spent: 35 minutes    Irine Seal, MD [facility]   To contact the attending provider between 7A-7P or the covering  provider during after hours 7P-7A, please log into the web site [email or web address] and access using universal [place] password for that web site. If you do not have the password, please call the hospital operator.  08/13/2019, 3:29 PM

## 2019-08-14 LAB — CBC WITH DIFFERENTIAL/PLATELET
Abs Immature Granulocytes: 0.01 10*3/uL (ref 0.00–0.07)
Basophils Absolute: 0.1 10*3/uL (ref 0.0–0.1)
Basophils Relative: 1 %
Eosinophils Absolute: 0.2 10*3/uL (ref 0.0–0.5)
Eosinophils Relative: 4 %
HCT: 43.6 % (ref 36.0–46.0)
Hemoglobin: 14.8 g/dL (ref 12.0–15.0)
Immature Granulocytes: 0 %
Lymphocytes Relative: 42 %
Lymphs Abs: 2.3 10*3/uL (ref 0.7–4.0)
MCH: 31.6 pg (ref 26.0–34.0)
MCHC: 33.9 g/dL (ref 30.0–36.0)
MCV: 93 fL (ref 80.0–100.0)
Monocytes Absolute: 0.4 10*3/uL (ref 0.1–1.0)
Monocytes Relative: 6 %
Neutro Abs: 2.6 10*3/uL (ref 1.7–7.7)
Neutrophils Relative %: 47 %
Platelets: 215 10*3/uL (ref 150–400)
RBC: 4.69 MIL/uL (ref 3.87–5.11)
RDW: 12.1 % (ref 11.5–15.5)
WBC: 5.5 10*3/uL (ref 4.0–10.5)
nRBC: 0 % (ref 0.0–0.2)

## 2019-08-14 LAB — ROCKY MTN SPOTTED FVR ABS PNL(IGG+IGM)
RMSF IgG: POSITIVE — AB
RMSF IgM: 0.83 index (ref 0.00–0.89)

## 2019-08-14 LAB — RMSF, IGG, IFA: RMSF, IGG, IFA: 1:64 {titer} — ABNORMAL HIGH

## 2019-08-14 LAB — BASIC METABOLIC PANEL
Anion gap: 7 (ref 5–15)
BUN: 12 mg/dL (ref 8–23)
CO2: 25 mmol/L (ref 22–32)
Calcium: 9.6 mg/dL (ref 8.9–10.3)
Chloride: 110 mmol/L (ref 98–111)
Creatinine, Ser: 0.77 mg/dL (ref 0.44–1.00)
GFR calc Af Amer: >60 mL/min (ref >60)
GFR calc non Af Amer: >60 mL/min (ref >60)
Glucose, Bld: 107 mg/dL — ABNORMAL HIGH (ref 70–99)
Potassium: 4.1 mmol/L (ref 3.5–5.1)
Sodium: 142 mmol/L (ref 135–145)

## 2019-08-14 NOTE — Discharge Summary (Signed)
Physician Discharge Summary  Hannah Padilla OBS:[id number] DOB: [birth year] DOA: 08/11/2019  PCP: Carlena Hurl, PA-C  Admit date: 08/11/2019 Discharge date: 08/14/2019  Time spent: 55 minutes  Recommendations for Outpatient Follow-up:  1. Follow-up with Carlena Hurl, PA-C as scheduled on 08/18/2019 at 9:15 AM.  On follow-up patient's anxiety/panic attacks will need to be reassessed and followed up upon. 2. Follow-up at [facility] health outpatient behavioral health at [facility] or at [place] for follow-up on anxiety/panic attacks as per psych recommendations.   Discharge Diagnoses:  Principal Problem:   Altered mental state Active Problems:   Anxiety   Sleep disturbance   Disorientated   Vitamin D deficiency   RMSF [facility] spotted fever)   UTI (urinary tract infection)   Grief   Acute metabolic encephalopathy   Discharge Condition: Stable and improved  Diet recommendation: Heart healthy  Filed Weights   08/11/19 1018  Weight: 77.1 kg    History of present illness:  HPI per Dr.Li Hannah Padilla is a 69 y.o. female with medical history significant of previous urine tract infection, vitamin D deficiency, colon polyps who presented with confusion and anxiety.  Patient is a poor historian and H&P was obtained by chart review, talking to patient and ED staff.  Patient reported that her father passed away about 2-3 months ago, and she recently had tick exposure and was started on doxycycline about 5 weeks ago.   She apparently started to have gradual onset of intermittent panic attacks over last month.  Per chart review, her husband was concerned that the patient was confused, very different from her usual, and unable to perform all of her ADLs.  She had multiple encounters with her primary care providers for this. Her Xanax was increased from daily to two times a day about 1 week ago without improvement of her symptoms.  On 07/21/19, her urine culture showed  "Beta hemolytic Streptococcus, group B" and she was continued on doxycycline.  She endorses intermittent nausea.  Denies fevers, chills, shortness of breath, cough, wheezing, chest pain/pressure, palpitations, vomiting, diarrhea, abdominal pain.  Denies dysuria or urinary frequency or urgency.  Patient came to [facility] ED for further evaluation.  In the emergency room, she was afebrile with pulse 59, RR 16, BP 100/73 and room air O2 sats 99%.  The labs showed nonrevealing CMP and CBC, glucose 103, pyuria. Psychiatry was consulted. She received ceftriaxone in the ED  Hospital Course:  1 acute metabolic encephalopathy in the setting of underlying anxiety Likely secondary to UTI in the setting of underlying anxiety.  Patient was admitted.  Urine cultures obtained.  Patient placed empirically on IV Rocephin. MRI brain negative for any acute abnormalities.  Patient with no focal neurological deficits.  Urine cultures came back with 60,000 colonies of group B strep agalactiae.  Patient received 3 days of IV Rocephin.  Patient improved clinically.  By day of discharge patient was alert to self place and time.  Patient knows who the president is.  Patient was also maintained on Xanax as needed as well as hydroxyzine as needed per psych recommendations. Status post completion of full course of doxycycline for suspected [facility] spotted fever after a tick bite in the outpatient setting.  Patient is hydrated with IV fluids and was euvolemic by day of discharge.  Patient was likely close to baseline by day of discharge.  No need for neurology consultation at this time as patient with no focal neurological symptoms, MRI negative,  improved clinically.  Patient be discharged in stable and improved condition.  2.  Anxiety and intermittent panic attacks Patient noted to have a bout of panic attack the morning of 08/13/2019, however some improved after receiving anxiolytics.  Patient stated father passed away approximately 7  years ago and as such not going through grief at this time.  Patient seen in consultation by psychiatry who do not feel patient needs inpatient treatment and are recommending 0.5 mg twice daily as needed, Vistaril as needed and discontinuation or decreasing Ambien.  Ambien was subsequently discontinued.  Patient maintained on home regimen of propranolol.  Social work consulted and patient given information on outpatient follow-up with psychiatry for further medication titration and management of anxiety/panic attacks.  Patient has outpatient follow-up with PCP on 08/18/2019 and patient has been encouraged to follow-up for further management.  Patient will be discharged in stable and improved condition.  Patient was somewhat anxious about being discharged however patient was assured that she is on the same medications/anxiolytics at home that she was on during the hospitalization and has improved clinically.  Husband verified that they had enough medications at home.  Patient with close follow-up with PCP on 08/18/2019.  3.  Suspected [place] spotted fever after tick bite Completed course of doxycycline per PCP.  ED had sent Lyme and RMSF titers on admission which will need to be followed up in the outpatient setting with PCP.  Patient remained afebrile.  Outpatient follow-up with PCP.    4.  Vitamin D deficiency Patient was maintained on home regimen of vitamin D.  5.    Group B strep UTI Urine cultures grew 60,000 colonies of group B strep agalactiae.  Patient received 3 doses of IV Rocephin during the hospitalization.  Patient remained afebrile.  Mental status improved.  No further antibiotics needed.  Outpatient follow-up with PCP.   Procedures:  MRI brain 08/12/2019  Consultations:  Psychiatry: Dr.Archana 08/12/2019  Discharge Exam: Vitals:   08/13/19 2116 08/14/19 0458  BP: 127/88 105/60  Pulse: 65 75  Resp: 16 16  Temp: 97.9 F (36.6 C) 97.9 F (36.6 C)  SpO2: 98% 100%     General: NAD. Slightly anxious. Cardiovascular: RRR Respiratory: CTAB  Discharge Instructions   Discharge Instructions    Diet - low sodium heart healthy   Complete by: As directed    Increase activity slowly   Complete by: As directed      Allergies as of 08/14/2019   No Known Allergies     Medication List    STOP taking these medications   cloNIDine 0.1 MG tablet Commonly known as: CATAPRES     TAKE these medications   ALPRAZolam 0.5 MG tablet Commonly known as: XANAX Take 1 tablet (0.5 mg total) by mouth 2 (two) times daily as needed for up to 20 days for anxiety. What changed: when to take this   hydrOXYzine 25 MG capsule Commonly known as: VISTARIL Take 25 mg by mouth 3 (three) times daily as needed for anxiety.   naproxen 375 MG tablet Commonly known as: NAPROSYN Take 375 mg by mouth 3 (three) times daily as needed for mild pain.   omeprazole 40 MG capsule Commonly known as: PRILOSEC Take 1 capsule by mouth once daily   ondansetron 4 MG tablet Commonly known as: ZOFRAN 1 tablet po q4 hours prn What changed:   how much to take  how to take this  when to take this  reasons to take this  promethazine 25 MG tablet Commonly known as: PHENERGAN Take 25 mg by mouth every 6 (six) hours as needed for nausea.   propranolol 10 MG tablet Commonly known as: INDERAL Take 1 tablet (10 mg total) by mouth 2 (two) times daily.   Vitamin D (Ergocalciferol) 1.25 MG (50000 UNIT) Caps capsule Commonly known as: DRISDOL Take 1 capsule (50,000 Units total) by mouth every 7 (seven) days.      No Known Allergies  Follow-up Information    [facility] at [place]. Schedule an appointment as soon as possible for a visit.   Contact information:  [place], [place] [address] [place] at [place]. Schedule an appointment as soon as possible for a visit.    Contact information:  [address] [place],  [place]  [phone] Main: [phone]       Carlena Hurl, PA-C Follow up on 08/18/2019.   Specialty: Family Medicine Why: f/u as scheduled at 915am. Contact information: [place] [address] [phone]                The results of significant diagnostics from this hospitalization (including imaging, microbiology, ancillary and laboratory) are listed below for reference.    Significant Diagnostic Studies: MR BRAIN WO CONTRAST  Result Date: 08/11/2019 CLINICAL DATA:  Encephalopathy EXAM: MRI HEAD WITHOUT CONTRAST TECHNIQUE: Multiplanar, multiecho pulse sequences of the brain and surrounding structures were obtained without intravenous contrast. COMPARISON:  None. FINDINGS: Brain: There is no acute infarction or intracranial hemorrhage. There is no intracranial mass, mass effect, or edema. There is no hydrocephalus or extra-axial fluid collection. Ventricles and sulci are normal in size and configuration. Patchy foci of T2 hyperintensity in the supratentorial white matter are nonspecific but may reflect mild chronic microvascular ischemic changes. Vascular: Major vessel flow voids at the skull base are preserved. Skull and upper cervical spine: Normal marrow signal is preserved. Sinuses/Orbits: Trace paranasal sinus mucosal thickening. Orbits are unremarkable. Other: Sella is unremarkable.  Mastoid air cells are clear. IMPRESSION: No evidence of recent infarction, hemorrhage, or mass. Mild chronic microvascular ischemic changes. Electronically Signed   By: Macy Mis M.D.   On: 08/11/2019 14:00    Microbiology: Recent Results (from the past 240 hour(s))  SARS Coronavirus 2 by RT PCR (hospital order, performed in [facility] hospital lab) Nasopharyngeal Nasopharyngeal Swab     Status: None   Collection Time: 08/11/19 11:42 AM   Specimen: Nasopharyngeal Swab  Result Value Ref Range Status    SARS Coronavirus 2 NEGATIVE NEGATIVE Final    Comment: (NOTE) SARS-CoV-2 target nucleic acids are NOT DETECTED.  The SARS-CoV-2 RNA is generally detectable in upper and lower respiratory specimens during the acute phase of infection. The lowest concentration of SARS-CoV-2 viral copies this assay can detect is 250 copies / mL. A negative result does not preclude SARS-CoV-2 infection and should not be used as the sole basis for treatment or other patient management decisions.  A negative result may occur with improper specimen collection / handling, submission of specimen other than nasopharyngeal swab, presence of viral mutation(s) within the areas targeted by this assay, and inadequate number of viral copies (<250 copies / mL). A negative result must be combined with clinical observations, patient history, and epidemiological information.  Fact Sheet for Patients:   StrictlyIdeas.no  Fact Sheet for Healthcare Providers: BankingDealers.co.za  This test is  not yet approved or  cleared by the [place] and has been authorized for detection and/or diagnosis of SARS-CoV-2 by FDA under an Emergency Use Authorization (EUA).  This EUA will remain in effect (meaning this test can be used) for the duration of the COVID-19 declaration under Section 564(b)(1) of the Act, 21 U.S.C. section 360bbb-3(b)(1), unless the authorization is terminated or revoked sooner.  Performed at [facility], [place] [address], [place], [place] [address]   Urine Culture     Status: Abnormal   Collection Time: 08/11/19 11:42 AM   Specimen: Urine, Clean Catch  Result Value Ref Range Status   Specimen Description   Final    URINE, CLEAN CATCH Performed at [facility], [place] [address], [place], [place] [address]    Special Requests   Final    NONE Performed at [facility], [place] [address],  [place], [place] [address]    Culture (A)  Final    60,000 COLONIES/mL GROUP B STREP(S.AGALACTIAE)ISOLATED TESTING AGAINST S. AGALACTIAE NOT ROUTINELY PERFORMED DUE TO PREDICTABILITY OF AMP/PEN/VAN SUSCEPTIBILITY. Performed at [facility], [place] [address], [place], [place] [address]    Report Status 08/13/2019 FINAL  Final     Labs: Basic Metabolic Panel: Recent Labs  Lab 08/11/19 1142 08/13/19 0449 08/14/19 0440  NA 140 142 142  K 4.2 4.0 4.1  CL 103 109 110  CO2 26 24 25   GLUCOSE 103* 104* 107*  BUN 22 12 12   CREATININE 0.93 0.71 0.77  CALCIUM 9.4 9.3 9.6   Liver Function Tests: Recent Labs  Lab 08/11/19 1142  AST 18  ALT 15  ALKPHOS 76  BILITOT 0.8  PROT 6.7  ALBUMIN 4.1   No results for input(s): LIPASE, AMYLASE in the last 168 hours. No results for input(s): AMMONIA in the last 168 hours. CBC: Recent Labs  Lab 08/11/19 1142 08/13/19 0449 08/14/19 0440  WBC 7.7 5.9 5.5  NEUTROABS 5.1 2.9 2.6  HGB 15.4* 14.1 14.8  HCT 46.1* 41.1 43.6  MCV 94.5 93.4 93.0  PLT 225 196 215   Cardiac Enzymes: No results for input(s): CKTOTAL, CKMB, CKMBINDEX, TROPONINI in the last 168 hours. BNP: BNP (last 3 results) No results for input(s): BNP in the last 8760 hours.  ProBNP (last 3 results) No results for input(s): PROBNP in the last 8760 hours.  CBG: No results for input(s): GLUCAP in the last 168 hours.     Signed:  Irine Seal MD.  [facility] 08/14/2019, 11:17 AM

## 2019-08-14 NOTE — Progress Notes (Signed)
Patient and family very upset regarding Discharge stating that we didn't meet all of their expectations. Writer spoke to patient and husband regarding patient anxiety and medicated her properly. Writer also notified Dr. Grandville Silos that patient and husband feel that there expectations were not met regarding her anxiety. Writer also spoke to Surveyor, quantity of the unit regarding the situation. Spoke to case management regarding patient's discharge.

## 2019-08-14 NOTE — Progress Notes (Signed)
D/C instructions given to patient and husband. Patient or husband had no questions. NT or writer will wheel patient out once she is ready

## 2019-08-17 ENCOUNTER — Telehealth: Payer: Self-pay

## 2019-08-17 NOTE — Telephone Encounter (Signed)
Pt. Scheduled for hospital f/u for anxeity, UTI, and altered mental status on 08/18/19 and medications were gone over and reconciled.

## 2019-08-18 ENCOUNTER — Ambulatory Visit (INDEPENDENT_AMBULATORY_CARE_PROVIDER_SITE_OTHER): Payer: Medicare Other | Admitting: Medical

## 2019-08-18 ENCOUNTER — Other Ambulatory Visit: Payer: Self-pay

## 2019-08-18 ENCOUNTER — Encounter: Payer: Self-pay | Admitting: Medical

## 2019-08-18 ENCOUNTER — Encounter: Payer: Self-pay | Admitting: Gastroenterology

## 2019-08-18 ENCOUNTER — Encounter (HOSPITAL_COMMUNITY): Payer: Self-pay

## 2019-08-18 ENCOUNTER — Ambulatory Visit (INDEPENDENT_AMBULATORY_CARE_PROVIDER_SITE_OTHER)
Admission: EM | Admit: 2019-08-18 | Discharge: 2019-08-18 | Disposition: A | Discharge status: Home / Self Care | Payer: Medicare Other | Source: Home / Self Care

## 2019-08-18 ENCOUNTER — Inpatient Hospital Stay (HOSPITAL_COMMUNITY)
Admission: EM | Admit: 2019-08-18 | Discharge: 2019-08-22 | DRG: 071 | Disposition: A | Discharge status: Home / Self Care | Payer: Medicare Other | Attending: Internal Medicine | Admitting: Internal Medicine

## 2019-08-18 VITALS — HR 76 | Ht 67.0 in | Wt 165.2 lb

## 2019-08-18 DIAGNOSIS — R110 Nausea: Secondary | ICD-10-CM

## 2019-08-18 DIAGNOSIS — E0590 Thyrotoxicosis, unspecified without thyrotoxic crisis or storm: Secondary | ICD-10-CM | POA: Diagnosis not present

## 2019-08-18 DIAGNOSIS — B951 Streptococcus, group B, as the cause of diseases classified elsewhere: Secondary | ICD-10-CM | POA: Diagnosis not present

## 2019-08-18 DIAGNOSIS — Z79899 Other long term (current) drug therapy: Secondary | ICD-10-CM | POA: Diagnosis not present

## 2019-08-18 DIAGNOSIS — R7989 Other specified abnormal findings of blood chemistry: Secondary | ICD-10-CM

## 2019-08-18 DIAGNOSIS — E559 Vitamin D deficiency, unspecified: Secondary | ICD-10-CM | POA: Diagnosis not present

## 2019-08-18 DIAGNOSIS — G9341 Metabolic encephalopathy: Secondary | ICD-10-CM | POA: Diagnosis not present

## 2019-08-18 DIAGNOSIS — N390 Urinary tract infection, site not specified: Secondary | ICD-10-CM | POA: Diagnosis not present

## 2019-08-18 DIAGNOSIS — F410 Panic disorder [episodic paroxysmal anxiety] without agoraphobia: Secondary | ICD-10-CM

## 2019-08-18 DIAGNOSIS — Z86010 Personal history of colonic polyps: Secondary | ICD-10-CM | POA: Diagnosis not present

## 2019-08-18 DIAGNOSIS — F419 Anxiety disorder, unspecified: Secondary | ICD-10-CM

## 2019-08-18 DIAGNOSIS — R064 Hyperventilation: Secondary | ICD-10-CM | POA: Diagnosis not present

## 2019-08-18 DIAGNOSIS — B9620 Unspecified Escherichia coli [E. coli] as the cause of diseases classified elsewhere: Secondary | ICD-10-CM | POA: Insufficient documentation

## 2019-08-18 DIAGNOSIS — I10 Essential (primary) hypertension: Secondary | ICD-10-CM | POA: Diagnosis not present

## 2019-08-18 DIAGNOSIS — R4182 Altered mental status, unspecified: Secondary | ICD-10-CM | POA: Diagnosis not present

## 2019-08-18 DIAGNOSIS — A770 Spotted fever due to Rickettsia rickettsii: Secondary | ICD-10-CM

## 2019-08-18 DIAGNOSIS — M791 Myalgia, unspecified site: Secondary | ICD-10-CM | POA: Diagnosis present

## 2019-08-18 DIAGNOSIS — F411 Generalized anxiety disorder: Secondary | ICD-10-CM | POA: Diagnosis present

## 2019-08-18 DIAGNOSIS — Z03818 Encounter for observation for suspected exposure to other biological agents ruled out: Secondary | ICD-10-CM | POA: Diagnosis not present

## 2019-08-18 DIAGNOSIS — Z20822 Contact with and (suspected) exposure to covid-19: Secondary | ICD-10-CM | POA: Diagnosis present

## 2019-08-18 DIAGNOSIS — K219 Gastro-esophageal reflux disease without esophagitis: Secondary | ICD-10-CM | POA: Diagnosis present

## 2019-08-18 DIAGNOSIS — N3001 Acute cystitis with hematuria: Secondary | ICD-10-CM | POA: Diagnosis not present

## 2019-08-18 DIAGNOSIS — R569 Unspecified convulsions: Secondary | ICD-10-CM | POA: Diagnosis not present

## 2019-08-18 DIAGNOSIS — R410 Disorientation, unspecified: Secondary | ICD-10-CM

## 2019-08-18 LAB — URINALYSIS, ROUTINE W REFLEX MICROSCOPIC
Glucose, UA: NEGATIVE mg/dL
Ketones, ur: NEGATIVE mg/dL
Nitrite: NEGATIVE
Protein, ur: 30 mg/dL — AB
Specific Gravity, Urine: >1.03 — ABNORMAL HIGH (ref 1.005–1.030)
pH: 5.5 (ref 5.0–8.0)

## 2019-08-18 LAB — ACETAMINOPHEN LEVEL: Acetaminophen (Tylenol), Serum: <10 ug/mL — ABNORMAL LOW (ref 10–30)

## 2019-08-18 LAB — CBC
HCT: 49.7 % — ABNORMAL HIGH (ref 36.0–46.0)
Hemoglobin: 16.9 g/dL — ABNORMAL HIGH (ref 12.0–15.0)
MCH: 31.1 pg (ref 26.0–34.0)
MCHC: 34 g/dL (ref 30.0–36.0)
MCV: 91.4 fL (ref 80.0–100.0)
Platelets: 267 10*3/uL (ref 150–400)
RBC: 5.44 MIL/uL — ABNORMAL HIGH (ref 3.87–5.11)
RDW: 12 % (ref 11.5–15.5)
WBC: 7.9 10*3/uL (ref 4.0–10.5)
nRBC: 0 % (ref 0.0–0.2)

## 2019-08-18 LAB — URINALYSIS, MICROSCOPIC (REFLEX)

## 2019-08-18 LAB — COMPREHENSIVE METABOLIC PANEL
ALT: 19 U/L (ref 0–44)
AST: 20 U/L (ref 15–41)
Albumin: 4.4 g/dL (ref 3.5–5.0)
Alkaline Phosphatase: 86 U/L (ref 38–126)
Anion gap: 10 (ref 5–15)
BUN: 10 mg/dL (ref 8–23)
CO2: 24 mmol/L (ref 22–32)
Calcium: 9.7 mg/dL (ref 8.9–10.3)
Chloride: 106 mmol/L (ref 98–111)
Creatinine, Ser: 0.87 mg/dL (ref 0.44–1.00)
GFR calc Af Amer: >60 mL/min (ref >60)
GFR calc non Af Amer: >60 mL/min (ref >60)
Glucose, Bld: 127 mg/dL — ABNORMAL HIGH (ref 70–99)
Potassium: 3.9 mmol/L (ref 3.5–5.1)
Sodium: 140 mmol/L (ref 135–145)
Total Bilirubin: 1.1 mg/dL (ref 0.3–1.2)
Total Protein: 7.3 g/dL (ref 6.5–8.1)

## 2019-08-18 LAB — LYME DISEASE, WESTERN BLOT
IgG P18 Ab.: ABSENT
IgG P23 Ab.: ABSENT
IgG P28 Ab.: ABSENT
IgG P30 Ab.: ABSENT
IgG P39 Ab.: ABSENT
IgG P41 Ab.: ABSENT
IgG P45 Ab.: ABSENT
IgG P58 Ab.: ABSENT
IgG P66 Ab.: ABSENT
IgG P93 Ab.: ABSENT
IgM P23 Ab.: ABSENT
IgM P39 Ab.: ABSENT
IgM P41 Ab.: ABSENT
Lyme IgG Wb: NEGATIVE
Lyme IgM Wb: NEGATIVE

## 2019-08-18 LAB — SALICYLATE LEVEL: Salicylate Lvl: <7 mg/dL — ABNORMAL LOW (ref 7.0–30.0)

## 2019-08-18 LAB — RAPID URINE DRUG SCREEN, HOSP PERFORMED
Amphetamines: NOT DETECTED
Barbiturates: NOT DETECTED
Benzodiazepines: POSITIVE — AB
Cocaine: NOT DETECTED
Opiates: NOT DETECTED
Tetrahydrocannabinol: NOT DETECTED

## 2019-08-18 LAB — VITAMIN B1: Vitamin B1 (Thiamine): 160.2 nmol/L (ref 66.5–200.0)

## 2019-08-18 LAB — ETHANOL: Alcohol, Ethyl (B): <10 mg/dL (ref <10)

## 2019-08-18 NOTE — BH Assessment (Addendum)
Comprehensive Clinical Assessment (CCA) Screening, Triage and Referral Note  08/18/2019 KAITLYND PHILLIPS [id number]   Patient is a 69 y.o. female with recent onset of acute anxiety to include panic attacks.  Patient has no past psychiatric history.  Symptom onset was around 6/4 at which point patient was seen at [facility] clinic in [place].  She has been seen emergently several times and presented to her PCP this morning, as she is experiencing worsening anxiety with no relief from symptoms.  Patient is visibly anxious and SOB on arrival.  She also complains of severe abdominal pain during assessment.  Patient is disoriented on assessment, struggling to recall date and situation.  She struggles with short term memory and has difficulty responding to questions, having forgotten the question and/or what she had planned to say.  Patient denies SI, HI and AVH outside of a brief episode while at [facility] when she as admitted for UTI.  During this admission she experienced visual hallucinations of bugs on the floor lasting 24 hours.   No current AVH reported.   Patient's husband has served as a 24 hour support and has needed to assist with ADLs, due to patient's worsening anxiety and confusion.  He states this is a significant deviation from her baseline 1.5 months ago when she was well and functioning normally.  He is hoping patient will be admitted to an inpatient program for evaluation and medication recommendations "under the care of one provider."  He feels there have been various recommendations with minimal improvement in symptoms.  Patient's husband was under the impression that patient would receive inpatient treatment here at [facility].    Per Marvia Pickles, NP patient meets criteria for inpatient geriatric psychiatric treatment.  Patient's husband prefers to take patient to the ER, as opposed to utilizing EMS or Safe Transport. Darnelle Maffucci Money has contacted ED staff to inform of patient's plan to  present for further medical evaluation, neurological consult and referral for inpatient treatment.    Visit Diagnosis:    ICD-10-CM   1. Anxiety  F41.9   2. Disorientated  R41.0   3. Panic attack  F41.0     Patient Reported Information How did you hear about [place]? Primary Care   Referral name: Dorothea Ogle PA with [facility]   Referral phone number: No data recorded Whom do you see for routine medical problems? Primary Care   Practice/Facility Name: See above   Practice/Facility Phone Number: No data recorded  Name of Contact: No data recorded  Contact Number: No data recorded  Contact Fax Number: No data recorded  Prescriber Name: No data recorded  Prescriber Address (if known): No data recorded What Is the Reason for Your Visit/Call Today? worsening anxiety, panic attacks with no relief from symptoms.  How Long Has This Been Causing You Problems? 1-6 months  Have You Recently Been in Any Inpatient Treatment (Hospital/Detox/Crisis Center/28-Day Program)? No   Name/Location of Program/Hospital:No data recorded  How Long Were You There? No data recorded  When Were You Discharged? No data recorded Have You Ever Received Services From [facility] Before? Yes   Who Do You See at [facility]? Patient admitted to [facility].  Have You Recently Had Any Thoughts About Hurting Yourself? No   Are You Planning to Commit Suicide/Harm Yourself At This time?  No  Have you Recently Had Thoughts About [place]? No   Explanation: No data recorded Have You Used Any Alcohol  or Drugs in the Past 24 Hours? No   How Long Ago Did You Use Drugs or Alcohol?  No data recorded  What Did You Use and How Much? No data recorded What Do You Feel Would Help You the Most Today? Medication;Therapy  Do You Currently Have a Therapist/Psychiatrist? No   Name of Therapist/Psychiatrist: No data recorded  Have You Been Recently Discharged From Any Office  Practice or Programs? No   Explanation of Discharge From Practice/Program:  No data recorded    CCA Screening Triage Referral Assessment Type of Contact: Face-to-Face   Is this Initial or Reassessment? No data recorded  Date Telepsych consult ordered in [facility]:  No data recorded  Time Telepsych consult ordered in [facility]:  No data recorded Patient Reported Information Reviewed? Yes   Patient Left Without Being Seen? No data recorded  Reason for Not Completing Assessment: No data recorded Collateral Involvement: Patient's husband is present and he provides collateral.  Does Patient Have a [place]? No data recorded  Name and Contact of Legal Guardian:  No data recorded If Minor and Not Living with Parent(s), Who has Custody? N/A  Is CPS involved or ever been involved? Never  Is APS involved or ever been involved? Never  Patient Determined To Be At Risk for Harm To Self or Others Based on Review of Patient Reported Information or Presenting Complaint? No   Method: No data recorded  Availability of Means: No data recorded  Intent: No data recorded  Notification Required: No data recorded  Additional Information for Danger to Others Potential:  No data recorded  Additional Comments for Danger to Others Potential:  No data recorded  Are There Guns or Other Weapons in Your Home?  No data recorded   Types of Guns/Weapons: No data recorded   Are These Weapons Safely Secured?                              No data recorded   Who Could Verify You Are Able To Have These Secured:    No data recorded Do You Have any Outstanding Charges, Pending Court Dates, Parole/Probation? No data recorded Contacted To Inform of Risk of Harm To Self or Others: No data recorded Location of Assessment: [facility]  Does Patient Present under Involuntary Commitment? No   IVC Papers Initial File Date: No data recorded  [place] of Residence: [place]  Patient Currently  Receiving the Following Services: Medication Management   Determination of Need: Emergent (2 hours)   Options For Referral: Inpatient Hospitalization   Fransico Meadow

## 2019-08-18 NOTE — ED Notes (Signed)
From [facility], sitting outside with husband

## 2019-08-18 NOTE — Progress Notes (Signed)
Subjective: Chief Complaint  Patient presents with  . Follow-up    refill Hydroxyzine and promethazine   . Urinary Tract Infection  . Anxiety   Here for follow-up  Of note, husband [name] brings her in today.  He provides the majority of the history.  FYI - They do not get good phone reception so they rely more on text and phone.  She has had a rough several weeks starting in early June with several visits to the emergency department and hospitalization, visits here.  She was just admitted July 6 through August 14, 2019 to hospital for altered mental state, anxiety, sleep disturbance, vitamin D deficiency, disorientation, RMSF, UTI, grief, acute metabolic encephalopathy.  In recent weeks she had urinary tract infection as well as positive RMSF titers.    With this hospitalization, it was felt that her acute metabolic encephalopathy was due to urinary tract infection and underlying anxiety.  This hospital visit urine cultures were obtained and she was empirically placed on Rocephin initially.  She had an MRI brain that was negative for acute issue.  No focal neurological deficits.  Urine culture came back positive.  She improved clinically.  She was maintained on Xanax and hydroxyzine to help with anxiety per psychiatry recommendations.  She had also completed a full course of doxycycline prior to this hospitalization.  They felt at the time of discharge she did not need neurological consultation.  Regarding anxiety, she did have consults with psychiatry.  They did not feel she needed to maintain inpatient treatment for this.  They recommended 0.5 mg alprazolam twice daily.  They recommended decreasing or discontinuing Ambien.  They felt like she could use Vistaril as needed.  Ambien was discontinued.   Currently her and her husband note that she is not getting relief of the constant anxiety and panic attacks.  Even this morning she has had several panic attacks and including current in this office.    Despite using the alprazolam the symptoms just do not improve.  In the last few days no fever, no other new symptoms, just dealing with the panic attacks and anxiety primarily.  Past Medical History:  Diagnosis Date  . Colon polyp 2015  . Farsightedness    wears glasses  . Urinary tract infection    Current Outpatient Medications on File Prior to Visit  Medication Sig Dispense Refill  . ALPRAZolam (XANAX) 0.5 MG tablet Take 1 tablet (0.5 mg total) by mouth 2 (two) times daily as needed for up to 20 days for anxiety. (Patient taking differently: Take 0.5 mg by mouth in the morning, at noon, in the evening, and at bedtime. ) 40 tablet 0  . cloNIDine (CATAPRES) 0.1 MG tablet Take by mouth.    . hydrOXYzine (VISTARIL) 25 MG capsule Take 25 mg by mouth 3 (three) times daily as needed for anxiety.     . naproxen (NAPROSYN) 375 MG tablet Take 375 mg by mouth 3 (three) times daily as needed for mild pain.    Marland Kitchen omeprazole (PRILOSEC) 40 MG capsule Take 1 capsule by mouth once daily 30 capsule 0  . ondansetron (ZOFRAN) 4 MG tablet 1 tablet po q4 hours prn (Patient taking differently: Take 4 mg by mouth every 4 (four) hours as needed (panic attack). 1 tablet po q4 hours prn) 30 tablet 0  . promethazine (PHENERGAN) 25 MG tablet Take 25 mg by mouth every 6 (six) hours as needed for nausea.     . propranolol (INDERAL) 10 MG tablet  Take 1 tablet (10 mg total) by mouth 2 (two) times daily. 60 tablet 1  . Vitamin D, Ergocalciferol, (DRISDOL) 1.25 MG (50000 UNIT) CAPS capsule Take 1 capsule (50,000 Units total) by mouth every 7 (seven) days. 12 capsule 1   No current facility-administered medications on file prior to visit.   ROS as in subjective    Objective: Pulse 76   Ht 5\' 7"  (1.702 m)   Wt 165 lb 3.2 oz (74.9 kg)   SpO2 97%   BMI 25.87 kg/m   Gen: Seems panicky, having panic attack in the office, anxious, but is answering questions  alert and oriented  Brain MRI 08/11/19: IMPRESSION: No  evidence of recent infarction, hemorrhage, or mass. Mild chronic microvascular ischemic changes.   Assessment: Encounter Diagnoses  Name Primary?  Marland Kitchen Anxiety Yes  . Panic attack   . Urinary tract infection without hematuria, site unspecified   . RMSF [facility] spotted fever)   . Abnormal thyroid blood test   . Chronic nausea   . Vitamin D deficiency      Plan: At this point the biggest issue is panic attacks and anxiety uncontrolled.  Despite using Xanax twice daily, hydroxyzine/Vistaril periodically, propanolol for recent elevated pulse rate and anxiety, she still does not feel any improvements with the anxiety and panic.  I called over to the behavioral health center just up the street.  They will see her today in the urgent care.  I tried to reiterate to Mrs. Blackard that the answer is finding ways to cope with anxiety, and I hope the visit today with behavioral health urgent care can give her some strategies to cope and help with medication management.  The current regimen is not working.  I reviewed the hospital discharge summary, recent imaging, labs, recommendations.  At this point she has received 3 rounds of IV Rocephin at the hospital for urinary tract infection, she had already completed doxycycline for [facility] spotted fever infection.    We will plan to follow-up in the next couple weeks with repeat thyroid labs.  The thought was with the recent infections that there could have been some acute thyroiditis.  I had previously contacted endocrinology at her last visit here and they advised we hold off on pursuing thyroid illness until the infections are treated and things are calming down.  Thus that is still the plan.  We will plan to recheck TSH, T4, T3 in the upcoming short-term.  For now she can continue the propanolol.  Chronic nausea-we had advised GI consult even last year but she never pursued this.  She is taking Zofran or Phenergan as needed.  We have her an  appointment scheduled for gastroenterology on July 22 with the [facility].  I gave them this information today  Vitamin D deficiency-continue supplement  Her husband [name] will take her immediately to the behavioral health urgent care just up the street  I asked husband to call back today to let me know how the visit goes today.

## 2019-08-18 NOTE — ED Notes (Signed)
Pt husband feels that nothing is being done and decided to just take her to ED for questions he has about pt's meds. Pt in wheelchair for safety. Belongings returned to husband from locker #25. AVS given to husband. Advised husband if he had questions about pt's current meds that he should discuss those with PCP. Wheeled pt outside to curb. Helped transition from wheelchair to car. Pt stable at time of d/c

## 2019-08-18 NOTE — ED Triage Notes (Signed)
Pt sent here by [facility] for further evaluation of AMS and anxiety. Pt cleared from [facility], recommended a neurology consult. Pt alert, c.o abd pain

## 2019-08-18 NOTE — Patient Instructions (Addendum)
I am sorry you are going through this now.    Please go straight to [facility] now for the urgent care.  They will see you today.   [facility] [place], [place]  [id number]    Behavioral Health Crisis Line and Main phone number [facility]  [facility] [phone]

## 2019-08-18 NOTE — ED Provider Notes (Signed)
Behavioral Health Medical Screening Exam  Hannah Padilla is a 69 y.o. female.  Patient is seen by this provider and appears to be very anxious.  Patient had reported dizziness to one of the staff members and had her vital signs were rechecked and her blood pressure was 125/84.  Patient presents with her husband and they report that she has not gotten any better since she got out of the hospital on 08/14/2019.  They state that she was prescribed Xanax and it first husband reports that the Xanax makes the patient extremely confused and disoriented but then states that this happens after the Xanax "knocks her out."  Patient has a history of possible [facility] spotted fever, altered mental status, and recent UTI but was prescribed antibiotics.  Patient also had an MRI done that showed chronic ischemic changes.  Per the husband this is been changes with her anxiety and altered mental status as of 1 month ago.  After discussing this with Dr. Dwyane Dee feel that it would be necessity to have the patient transferred to [facility] emergency department to be evaluated for full medical work-up as well as a neurological consult.  Discussed this with the patient and the patient's husband and they reported that the did not want to use EMS and that he would transport her to the [facility] emergency department himself.  The patient has not made any suicidal or homicidal ideations and denies having any hallucinations.  Patient does not appear to be acutely psychotic nor threat to herself or anyone else.  The severe anxiety along with the multiple medical issues that have happened recently is a concern.  Did discuss about discontinuation of the Xanax and possibly starting Seroquel 12.5 mg p.o. twice daily, this may be beneficial to the patient when she is in the emergency department being evaluated.[facility] emergency department and notified them that the patient was wanting to transport themselves and that there was no  criteria for any IVC.  Total Time spent with patient: 30 minutes  Psychiatric Specialty Exam  Presentation  General Appearance:Disheveled  Eye Contact:Minimal  Speech:Clear and Coherent  Speech Volume:Decreased  Handedness:Right   Mood and Affect  Mood:Irritable;Anxious  Affect:Labile   Thought Process  Thought Processes:Other (comment) (Coherent and disorganized at times per report)  Descriptions of Associations:Intact  Orientation:Full (Time, Place and Person)  Thought Content:Paranoid Ideation  Hallucinations:None  Ideas of Reference:None  Suicidal Thoughts:No  Homicidal Thoughts:No   Sensorium  Memory:Immediate Fair;Recent Fair;Remote Fair  Judgment:Poor  Insight:Fair   Executive Functions  Concentration:Poor  Attention Span:Poor  [place]   Psychomotor Activity  Psychomotor Activity:Increased   Assets  Assets:Desire for Improvement;Financial Resources/Insurance;Housing;Transportation;Social Support   Sleep  Sleep:Fair  Number of hours: No data recorded  Physical Exam: Physical Exam Vitals and nursing note reviewed.  Constitutional:      Appearance: She is well-developed.  Cardiovascular:     Rate and Rhythm: Normal rate.  Pulmonary:     Effort: Pulmonary effort is normal.  Musculoskeletal:        General: Normal range of motion.  Skin:    General: Skin is warm.  Neurological:     Mental Status: She is alert and oriented to person, place, and time.    Review of Systems  Constitutional: Negative.   HENT: Negative.   Eyes: Negative.   Respiratory: Negative.   Cardiovascular: Negative.   Gastrointestinal: Negative.   Genitourinary: Negative.   Musculoskeletal: Negative.  Skin: Negative.   Neurological: Negative.   Endo/Heme/Allergies: Negative.   Psychiatric/Behavioral: The patient is nervous/anxious.    Blood pressure (!) 123/103, pulse 81, temperature 97.7 F (36.5  C), temperature source Temporal, resp. rate 18, height 5\' 7"  (1.702 m), weight 170 lb (77.1 kg), SpO2 98 %. Body mass index is 26.63 kg/m.  Musculoskeletal: Strength & Muscle Tone: within normal limits Gait & Station: normal Patient leans: N/A   Recommendations:  Patient needs to go to the emergency room for evaluation and full medical work-up and neurological consult.  Patient's vital signs are WNL.  Patient and patient's husband both refused for EMS transport or safe transport and stated that they would transport to the hospital via their POV.  [facility], emergency department has been notified.  [place], FNP 08/18/2019, 12:21 PM

## 2019-08-19 ENCOUNTER — Telehealth: Payer: Self-pay | Admitting: Medical

## 2019-08-19 DIAGNOSIS — F419 Anxiety disorder, unspecified: Secondary | ICD-10-CM | POA: Diagnosis not present

## 2019-08-19 DIAGNOSIS — N390 Urinary tract infection, site not specified: Secondary | ICD-10-CM | POA: Diagnosis present

## 2019-08-19 DIAGNOSIS — R4182 Altered mental status, unspecified: Secondary | ICD-10-CM

## 2019-08-19 DIAGNOSIS — R064 Hyperventilation: Secondary | ICD-10-CM | POA: Diagnosis present

## 2019-08-19 DIAGNOSIS — K219 Gastro-esophageal reflux disease without esophagitis: Secondary | ICD-10-CM | POA: Diagnosis not present

## 2019-08-19 DIAGNOSIS — F411 Generalized anxiety disorder: Secondary | ICD-10-CM | POA: Diagnosis present

## 2019-08-19 DIAGNOSIS — Z79899 Other long term (current) drug therapy: Secondary | ICD-10-CM | POA: Diagnosis not present

## 2019-08-19 DIAGNOSIS — F410 Panic disorder [episodic paroxysmal anxiety] without agoraphobia: Secondary | ICD-10-CM | POA: Diagnosis present

## 2019-08-19 DIAGNOSIS — Z20822 Contact with and (suspected) exposure to covid-19: Secondary | ICD-10-CM | POA: Diagnosis present

## 2019-08-19 DIAGNOSIS — G9341 Metabolic encephalopathy: Secondary | ICD-10-CM | POA: Insufficient documentation

## 2019-08-19 DIAGNOSIS — Z86010 Personal history of colonic polyps: Secondary | ICD-10-CM | POA: Diagnosis not present

## 2019-08-19 DIAGNOSIS — N3001 Acute cystitis with hematuria: Secondary | ICD-10-CM

## 2019-08-19 DIAGNOSIS — R569 Unspecified convulsions: Secondary | ICD-10-CM | POA: Diagnosis not present

## 2019-08-19 DIAGNOSIS — E0590 Thyrotoxicosis, unspecified without thyrotoxic crisis or storm: Secondary | ICD-10-CM | POA: Diagnosis present

## 2019-08-19 DIAGNOSIS — A770 Spotted fever due to Rickettsia rickettsii: Secondary | ICD-10-CM | POA: Diagnosis present

## 2019-08-19 DIAGNOSIS — M791 Myalgia, unspecified site: Secondary | ICD-10-CM | POA: Diagnosis present

## 2019-08-19 DIAGNOSIS — B951 Streptococcus, group B, as the cause of diseases classified elsewhere: Secondary | ICD-10-CM | POA: Diagnosis present

## 2019-08-19 DIAGNOSIS — E559 Vitamin D deficiency, unspecified: Secondary | ICD-10-CM | POA: Diagnosis present

## 2019-08-19 LAB — URINALYSIS, ROUTINE W REFLEX MICROSCOPIC
Bilirubin Urine: NEGATIVE
Bilirubin Urine: NEGATIVE
Glucose, UA: NEGATIVE mg/dL
Glucose, UA: NEGATIVE mg/dL
Ketones, ur: 5 mg/dL — AB
Ketones, ur: NEGATIVE mg/dL
Nitrite: NEGATIVE
Nitrite: NEGATIVE
Protein, ur: NEGATIVE mg/dL
Protein, ur: NEGATIVE mg/dL
Specific Gravity, Urine: 1.003 — ABNORMAL LOW (ref 1.005–1.030)
Specific Gravity, Urine: 1.003 — ABNORMAL LOW (ref 1.005–1.030)
WBC, UA: >50 WBC/hpf — ABNORMAL HIGH (ref 0–5)
WBC, UA: >50 WBC/hpf — ABNORMAL HIGH (ref 0–5)
pH: 6 (ref 5.0–8.0)
pH: 6 (ref 5.0–8.0)

## 2019-08-19 LAB — T4, FREE: Free T4: 1.95 ng/dL — ABNORMAL HIGH (ref 0.61–1.12)

## 2019-08-19 LAB — AMMONIA: Ammonia: 17 umol/L (ref 9–35)

## 2019-08-19 LAB — SARS CORONAVIRUS 2 BY RT PCR (HOSPITAL ORDER, PERFORMED IN ~~LOC~~ HOSPITAL LAB): SARS Coronavirus 2: NEGATIVE

## 2019-08-19 LAB — TSH: TSH: 1.906 u[IU]/mL (ref 0.350–4.500)

## 2019-08-19 MED ORDER — ONDANSETRON HCL 4 MG/2ML IJ SOLN
4.0000 mg | Freq: Once | INTRAMUSCULAR | Status: AC
  Administered 2019-08-19: 4 mg via INTRAVENOUS
  Filled 2019-08-19: qty 2

## 2019-08-19 MED ORDER — SODIUM CHLORIDE 0.9 % IV SOLN
1.0000 g | Freq: Once | INTRAVENOUS | Status: AC
  Administered 2019-08-19: 1 g via INTRAVENOUS
  Filled 2019-08-19: qty 10

## 2019-08-19 MED ORDER — DICLOFENAC SODIUM 1 % EX GEL
2.0000 g | Freq: Four times a day (QID) | CUTANEOUS | Status: DC
  Filled 2019-08-19 (×2): qty 100

## 2019-08-19 MED ORDER — VITAMIN D (ERGOCALCIFEROL) 1.25 MG (50000 UNIT) PO CAPS
50000.0000 [IU] | ORAL_CAPSULE | ORAL | Status: DC
  Administered 2019-08-19: 50000 [IU] via ORAL
  Filled 2019-08-19: qty 1

## 2019-08-19 MED ORDER — ACETAMINOPHEN 650 MG RE SUPP: 650.0000 mg | Freq: Four times a day (QID) | RECTAL | Status: DC | PRN

## 2019-08-19 MED ORDER — NAPROXEN 375 MG PO TABS
375.0000 mg | ORAL_TABLET | Freq: Three times a day (TID) | ORAL | Status: DC | PRN
  Administered 2019-08-19 – 2019-08-22 (×2): 375 mg via ORAL
  Filled 2019-08-19: qty 2
  Filled 2019-08-19 (×2): qty 1

## 2019-08-19 MED ORDER — ACETAMINOPHEN 325 MG PO TABS: 650.0000 mg | ORAL_TABLET | Freq: Four times a day (QID) | ORAL | Status: DC | PRN

## 2019-08-19 MED ORDER — PROPRANOLOL HCL 10 MG PO TABS
10.0000 mg | ORAL_TABLET | Freq: Two times a day (BID) | ORAL | Status: DC
  Administered 2019-08-19 – 2019-08-22 (×6): 10 mg via ORAL
  Filled 2019-08-19 (×8): qty 1

## 2019-08-19 MED ORDER — SODIUM CHLORIDE 0.9 % IV SOLN
1.0000 g | Freq: Once | INTRAVENOUS | Status: AC
  Administered 2019-08-20: 1 g via INTRAVENOUS
  Filled 2019-08-19: qty 10

## 2019-08-19 MED ORDER — PANTOPRAZOLE SODIUM 40 MG PO TBEC
40.0000 mg | DELAYED_RELEASE_TABLET | Freq: Every day | ORAL | Status: DC
  Administered 2019-08-19 – 2019-08-22 (×4): 40 mg via ORAL
  Filled 2019-08-19 (×4): qty 1

## 2019-08-19 MED ORDER — ENOXAPARIN SODIUM 40 MG/0.4ML ~~LOC~~ SOLN
40.0000 mg | SUBCUTANEOUS | Status: DC
  Administered 2019-08-19 – 2019-08-21 (×3): 40 mg via SUBCUTANEOUS
  Filled 2019-08-19 (×3): qty 0.4

## 2019-08-19 MED ORDER — CLONIDINE HCL 0.1 MG PO TABS
0.1000 mg | ORAL_TABLET | Freq: Two times a day (BID) | ORAL | Status: DC
  Administered 2019-08-19: 0.1 mg via ORAL
  Filled 2019-08-19: qty 1

## 2019-08-19 MED ORDER — LORAZEPAM 2 MG/ML IJ SOLN
1.0000 mg | Freq: Once | INTRAMUSCULAR | Status: AC
  Administered 2019-08-19: 1 mg via INTRAVENOUS
  Filled 2019-08-19: qty 1

## 2019-08-19 MED ORDER — ALPRAZOLAM 0.25 MG PO TABS
0.2500 mg | ORAL_TABLET | Freq: Four times a day (QID) | ORAL | Status: DC | PRN
  Administered 2019-08-19 – 2019-08-22 (×9): 0.25 mg via ORAL
  Filled 2019-08-19 (×10): qty 1

## 2019-08-19 MED ORDER — LORAZEPAM 2 MG/ML IJ SOLN
0.5000 mg | Freq: Once | INTRAMUSCULAR | Status: AC
  Administered 2019-08-19: 0.5 mg via INTRAVENOUS
  Filled 2019-08-19: qty 1

## 2019-08-19 MED ORDER — ALBUTEROL SULFATE (2.5 MG/3ML) 0.083% IN NEBU: 2.5000 mg | INHALATION_SOLUTION | Freq: Four times a day (QID) | RESPIRATORY_TRACT | Status: DC | PRN

## 2019-08-19 MED ORDER — SODIUM CHLORIDE 0.9% FLUSH
3.0000 mL | Freq: Two times a day (BID) | INTRAVENOUS | Status: DC
  Administered 2019-08-19 – 2019-08-21 (×6): 3 mL via INTRAVENOUS
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
## 2019-08-19 NOTE — ED Provider Notes (Signed)
[facility] EMERGENCY DEPARTMENT Provider Note   CSN: [id number] Arrival date & time: 08/18/19  1244     History Chief Complaint  Patient presents with  . Abdominal Pain  . Altered Mental Status    PATRECE TALLIE is a 69 y.o. female.  The history is provided by the patient and medical records.   69 year old female with history of anxiety, metabolic encephalopathy, RMSF, vitamin D deficiency, tremors, presenting to the ED from behavioral health urgent care for altered mental status.  Husband reports symptoms have tremendously declined over the past month and a half.  He states she was completely normal, very active and now has chronic, debilitating anxiety.  Patient herself reports that initially this was only happening once or twice every 2 weeks, now is happening multiple times a day.  Husband reports she will state "another one is coming", become tremulous, hyperventilating, and becoming almost nonfunctional.  He states she has had a few episodes of confusion, applied hydrocortisone cream to her toothbrush, urinated in a trash can and cowboy hat as opposed to a toilet, etc.  She did have one fall about a week ago, however that was mechanical and over the last step in their home.  Husband reports he pretty much stays with her 24/7 to monitor her as she cannot be left alone safely.  Patient has had recent hospitalization for same, diagnosed with UTI as well as RMSF with positive titers.  She completed full course of doxycycline as well as 3 days of IV Rocephin, however was not discharged home on any current antibiotics.  Patient denies any specific pain--no headache, neck pain, chest pain, shortness of breath, fever, or chills.  No urinary symptoms, however states urine has been dark and malodorous.  She is not had any vomiting or diarrhea.  Husband states she sparingly eats.  She has been sleeping intermittently, mostly after taking her Xanax.  Husband hears a little frustrated  which is understandable.  They were initially seen by PCP yesterday morning, sent to behavioral health urgent care as there seemed to be a need for inpatient hospitalization.  Once arrived there, husband reports he was told she would be admitted to a [facility], then they were sent here for medical evaluation and to be seen by neurology.  Patient did have a negative MRI of her brain during last hospitalization but neurology consult was not pursued.  Husband states even despite treatment for her UTI and RMSF, she has not had any substantial improvement and he is not sure what else to do for her.  Past Medical History:  Diagnosis Date  . Colon polyp 2015  . Farsightedness    wears glasses  . Urinary tract infection     Patient Active Problem List   Diagnosis Date Noted  . Panic attack 08/18/2019  . Urinary tract infection without hematuria 08/18/2019  . Acute metabolic encephalopathy   . UTI (urinary tract infection) 08/12/2019  . Altered mental state 08/12/2019  . Grief 08/12/2019  . RMSF [facility] spotted fever) 07/27/2019  . Anxiety 07/21/2019  . Elevated lipase 07/21/2019  . Abnormal thyroid blood test 07/21/2019  . Sleep disturbance 07/21/2019  . Disorientated 07/21/2019  . Hypokalemia 07/21/2019  . Leukocytosis 07/21/2019  . Tremor 07/21/2019  . Vitamin D deficiency 07/21/2019  . Palpitation 07/21/2019  . Vaginal bleeding 07/08/2018  . Encounter for hepatitis C screening test for low risk patient 07/08/2018  . Chronic nausea 07/08/2018  . Screening for cervical  cancer 07/08/2018  . Vaginal mass 07/08/2018  . Gastroesophageal reflux disease 07/08/2018  . Vaccine counseling 07/08/2018  . Estrogen deficiency 07/08/2018  . Post-menopausal 07/08/2018  . Need for pneumococcal vaccination 07/08/2018    Past Surgical History:  Procedure Laterality Date  . BREAST REDUCTION SURGERY    . CARPAL TUNNEL RELEASE     right  . COLONOSCOPY  2542   [place], [place]  .  lichenoid keratosis  09/05/11   Skin, left anterior shouldee  . PARTIAL HYSTERECTOMY     still has ovaries  . WISDOM TOOTH EXTRACTION       OB History    Gravida  0   Para  0   Term  0   Preterm  0   AB  0   Living  0     SAB  0   TAB  0   Ectopic  0   Multiple  0   Live Births  0           Family History  Problem Relation Age of Onset  . Alzheimer's disease Mother   . Heart disease Neg Hx   . Diabetes Neg Hx   . Cancer Neg Hx   . Stroke Neg Hx   . Hypertension Neg Hx   . Hyperlipidemia Neg Hx     Social History   Tobacco Use  . Smoking status: Never Smoker  . Smokeless tobacco: Never Used  Vaping Use  . Vaping Use: Never used  Substance Use Topics  . Alcohol use: Yes    Alcohol/week: 5.0 standard drinks    Types: 5 Glasses of wine per week    Comment: several days per week  . Drug use: No    Home Medications Prior to Admission medications   Medication Sig Start Date End Date Taking? Authorizing Provider  ALPRAZolam Duanne Moron) 0.5 MG tablet Take 1 tablet (0.5 mg total) by mouth 2 (two) times daily as needed for up to 20 days for anxiety. Patient taking differently: Take 0.5 mg by mouth in the morning, at noon, in the evening, and at bedtime.  08/03/19 08/23/19  Tysinger, Camelia Eng, PA-C  cloNIDine (CATAPRES) 0.1 MG tablet Take by mouth. 08/09/19 08/08/20  [provider]  hydrOXYzine (VISTARIL) 25 MG capsule Take 25 mg by mouth 3 (three) times daily as needed for anxiety.  07/13/19   [provider]  naproxen (NAPROSYN) 375 MG tablet Take 375 mg by mouth 3 (three) times daily as needed for mild pain.    [provider]  omeprazole (PRILOSEC) 40 MG capsule Take 1 capsule by mouth once daily 11/12/18   Tysinger, Camelia Eng, PA-C  ondansetron (ZOFRAN) 4 MG tablet 1 tablet po q4 hours prn Patient taking differently: Take 4 mg by mouth every 4 (four) hours as needed (panic attack). 1 tablet po q4 hours prn 07/22/19   Tysinger, Camelia Eng, PA-C    promethazine (PHENERGAN) 25 MG tablet Take 25 mg by mouth every 6 (six) hours as needed for nausea.  07/13/19   [provider]  propranolol (INDERAL) 10 MG tablet Take 1 tablet (10 mg total) by mouth 2 (two) times daily. 07/22/19 07/21/20  Tysinger, Camelia Eng, PA-C  Vitamin D, Ergocalciferol, (DRISDOL) 1.25 MG (50000 UNIT) CAPS capsule Take 1 capsule (50,000 Units total) by mouth every 7 (seven) days. 07/22/19   Tysinger, Camelia Eng, PA-C    Allergies    Patient has no known allergies.  Review of Systems   Review  of Systems  Psychiatric/Behavioral: Positive for confusion. The patient is nervous/anxious.   All other systems reviewed and are negative.   Physical Exam Updated Vital Signs BP (!) 139/92 (BP Location: Left Arm)   Pulse 82   Temp 98.5 F (36.9 C) (Oral)   Resp 18   SpO2 100%   Physical Exam Vitals and nursing note reviewed.  Constitutional:      General: She is not in acute distress.    Appearance: She is well-developed. She is not diaphoretic.  HENT:     Head: Normocephalic and atraumatic.  Eyes:     Conjunctiva/sclera: Conjunctivae normal.     Pupils: Pupils are equal, round, and reactive to light.  Cardiovascular:     Rate and Rhythm: Normal rate and regular rhythm.     Heart sounds: Normal heart sounds.  Pulmonary:     Effort: Pulmonary effort is normal.     Breath sounds: Normal breath sounds. No stridor. No wheezing.  Abdominal:     General: Bowel sounds are normal.     Palpations: Abdomen is soft.     Tenderness: There is no abdominal tenderness. There is no rebound.  Musculoskeletal:        General: Normal range of motion.     Cervical back: Normal range of motion.  Skin:    General: Skin is warm and dry.  Neurological:     Mental Status: She is alert.     Comments: Awake, alert, able to answer questions and follow commands for the most part, some responses are inappropriate and nonsensical; she is moving her extremities well, no focal deficits  appreciated  Psychiatric:        Mood and Affect: Mood is anxious.     Comments: Intermittently anxious and hyperventilating, when this occurs holds hands to the chest and trembles     ED Results / Procedures / Treatments   Labs (all labs ordered are listed, but only abnormal results are displayed) Labs Reviewed  COMPREHENSIVE METABOLIC PANEL - Abnormal; Notable for the following components:      Result Value   Glucose, Bld 127 (*)    All other components within normal limits  SALICYLATE LEVEL - Abnormal; Notable for the following components:   Salicylate Lvl <7.3 (*)    All other components within normal limits  ACETAMINOPHEN LEVEL - Abnormal; Notable for the following components:   Acetaminophen (Tylenol), Serum <10 (*)    All other components within normal limits  CBC - Abnormal; Notable for the following components:   RBC 5.44 (*)    Hemoglobin 16.9 (*)    HCT 49.7 (*)    All other components within normal limits  RAPID URINE DRUG SCREEN, HOSP PERFORMED - Abnormal; Notable for the following components:   Benzodiazepines POSITIVE (*)    All other components within normal limits  URINALYSIS, ROUTINE W REFLEX MICROSCOPIC - Abnormal; Notable for the following components:   APPearance TURBID (*)    Specific Gravity, Urine >1.030 (*)    Hgb urine dipstick SMALL (*)    Bilirubin Urine SMALL (*)    Protein, ur 30 (*)    Leukocytes,Ua MODERATE (*)    All other components within normal limits  URINALYSIS, MICROSCOPIC (REFLEX) - Abnormal; Notable for the following components:   Bacteria, UA MANY (*)    All other components within normal limits  URINALYSIS, ROUTINE W REFLEX MICROSCOPIC - Abnormal; Notable for the following components:   APPearance CLOUDY (*)    Specific Gravity,  Urine 1.003 (*)    Hgb urine dipstick SMALL (*)    Ketones, ur 5 (*)    Leukocytes,Ua LARGE (*)    WBC, UA >50 (*)    Bacteria, UA RARE (*)    Non Squamous Epithelial 0-5 (*)    All other components  within normal limits  URINALYSIS, ROUTINE W REFLEX MICROSCOPIC - Abnormal; Notable for the following components:   APPearance CLOUDY (*)    Specific Gravity, Urine 1.003 (*)    Hgb urine dipstick SMALL (*)    Leukocytes,Ua LARGE (*)    WBC, UA >50 (*)    Bacteria, UA MANY (*)    Non Squamous Epithelial 0-5 (*)    All other components within normal limits  URINE CULTURE  SARS CORONAVIRUS 2 BY RT PCR (HOSPITAL ORDER, [place] [facility])  ETHANOL  AMMONIA  TSH  T4, FREE    EKG None  Radiology No results found.  Procedures Procedures (including critical care time)  Medications Ordered in ED Medications - No data to display  ED Course  I have reviewed the triage vital signs and the nursing notes.  Pertinent labs & imaging results that were available during my care of the patient were reviewed by me and considered in my medical decision making (see chart for details).    MDM Rules/Calculators/A&P  69 y.o. F here with AMS.  Has had prior admissions for same, recent admission 7/7-7/9 and felt to be related to UTI along with RMSF.  Completed 3-day course of IV Rocephin along with full cycle of doxycycline, however there is been no noted improvement.  Husband states she is almost to the point where she is nonfunctional at this time.  States she does have a lot of panic attacks, but also has periods of confusion (putting hydrocortisone cream on toothpaste, urinating in trash can and cowboy hat).  States it is to the point that she cannot safely be left alone for even 5 minutes.  Patient was seen by PCP today and sent to behavioral health urgent care and initially plan for inpatient psychiatric treatment, however then recommended hospital evaluation with neurology consult.  Here, patient is intermittently alert, oriented but does have periods where her speech is non-sensical and unrelated to topic at hand.  She is moving her extremities well, no focal deficits  appreciated.  She also has witnessed panic attacks-- begins trembling, hyperventilating, and states she feels extremely nauseated.  Work-up today has been grossly reassuring.  Initial UA appears contaminated.  Given recent UTI, will try to get cathed specimen.  Will add on ammonia, TSH/T4  (has been followed as an OP).  Will consult neurology for recommendations.  3:23 AM Discussed with neurology, Dr. Cheral Marker-- he will see patient and provide recommendations.  5:18 AM Neurology has evaluated patient and recommenced hospital admission, 24hour EEG, they will follow.  RMSF likely red herring and may be false positive as clinically this is not correlating with her symptoms.  Attempted to get clean UA x3 including in and out cath, however still large amount of squamous cells consistent with contamination.  On cleanest sample, she does have >50 WBC, large leuks.  Culture pending.  Given recent UTI, will give IV dose of rocephin.  Discussed with hospitalist, Dr. Tonie Griffith-- day team to see and admit.  Final Clinical Impression(s) / ED Diagnoses Final diagnoses:  Altered mental status, unspecified altered mental status type    Rx / DC Orders ED Discharge Orders  None       Larene Pickett, PA-C 08/19/19 Whitmer, Delice Bison, DO 08/19/19 [phone]

## 2019-08-19 NOTE — ED Notes (Signed)
Pt is resting in recliner at this time.

## 2019-08-19 NOTE — H&P (Signed)
History and Physical    Hannah Padilla:[id number] DOB: 05/03/1950 DOA: 08/18/2019  Referring MD/NP/PA: Tonie Griffith, MD PCP: Carlena Hurl, PA-C  Patient coming from: home  Chief Complaint: Anxiety  I have personally briefly reviewed patient's old medical records in [place]   HPI: Hannah Padilla is a 69 y.o. female with medical history significant of urinary tract infection, [place] spotted fever after tick bite, vitamin D deficiency, and colon polyps who presented with plaints of continued confusion and anxiety.  History is mostly obtained from the patient's husband who is present at bedside as the patient is currently resting.  She had just recently been hospitalized from 7/6-7/9, with acute metabolic encephalopathy, anxiety with intermittent panic attacks, [facility] spotted fever, and group B strep agalactiae urinary tract infection.  Patient had already completed her course of doxycycline and had been treated while in the hospital with 3 doses of IV Rocephin.  MRI of the brain without contrast showed no acute abnormalities.  Per review of discharge it appears it was recommended for the patient to stop clonidine.  Patient husband notes that he was unaware of this and they had continued it.  Clonidine had initially been started for her anxiety and seemed to help some and he notes that she never had a history of elevated blood pressures.  Since being home patient continues to have these anxiety attacks for which Xanax will help break the attack, but thereafter patient is unable to function.  He reports that she has been seeing things that are not there.  Noted to have had conversations with her dead father.  Patient we will get extremely anxious and start shaking and appears that she is having a asthma attack and wobbly unable to sit down and restless.  She has been back-and-forth to be emergency department and ERs is recently being admitted and they have been unable to find the exact  cause.  Associated symptoms of muscle aches for which she is on naproxen and complaints of acid reflux.  Patient reportedly has not had any dysuria or urinary frequency.   He reports that she has a referral pending to [facility] scheduled for the 22nd of this month.   Husband states that before doing for patient never had any issues with anxiety or depression before June 4.  She had recently lost her father 2 to 3 months ago, but other than that and no other significant issues.   ED Course: Upon admission into the emergency department patient was seen to be afebrile with blood pressures 108/89-139/92, and all other vital signs maintained.  Labs from 7/13 significant for hemoglobin 16.9, TSH 1.906, and free T4 1.95.  UDS positive for benzodiazepine.  Urinalysis positive for large leukocytes, many bacteria, 21-50 RBCs, 21-50 squamous epithelial cells, and>50 WBCs.  Neurology have been formally consulted and recommended EEG monitoring.  Patient has been given Rocephin, Ativan, and Zofran.  TRH called to admit.   Review of Systems  Constitutional: Positive for malaise/fatigue. Negative for fever.  HENT: Negative for ear discharge and nosebleeds.   Eyes: Negative for photophobia and pain.  Respiratory: Negative for shortness of breath.   Cardiovascular: Negative for chest pain.  Gastrointestinal: Positive for heartburn. Negative for abdominal pain, nausea and vomiting.  Genitourinary: Negative for dysuria and frequency.  Musculoskeletal: Positive for joint pain and myalgias.  Skin: Negative for rash.  Neurological: Negative for weakness.  Psychiatric/Behavioral: Negative for substance abuse. The patient is nervous/anxious.  Past Medical History:  Diagnosis Date  . Colon polyp 2015  . Farsightedness    wears glasses  . Urinary tract infection     Past Surgical History:  Procedure Laterality Date  . BREAST REDUCTION SURGERY    . CARPAL TUNNEL RELEASE     right  . COLONOSCOPY  0272    [place], [place]  . lichenoid keratosis  09/05/11   Skin, left anterior shouldee  . PARTIAL HYSTERECTOMY     still has ovaries  . WISDOM TOOTH EXTRACTION       reports that she has never smoked. She has never used smokeless tobacco. She reports current alcohol use of about 5.0 standard drinks of alcohol per week. She reports that she does not use drugs.  No Known Allergies  Family History  Problem Relation Age of Onset  . Alzheimer's disease Mother   . Heart disease Neg Hx   . Diabetes Neg Hx   . Cancer Neg Hx   . Stroke Neg Hx   . Hypertension Neg Hx   . Hyperlipidemia Neg Hx     Prior to Admission medications   Medication Sig Start Date End Date Taking? Authorizing Provider  ALPRAZolam Duanne Moron) 0.5 MG tablet Take 1 tablet (0.5 mg total) by mouth 2 (two) times daily as needed for up to 20 days for anxiety. Patient taking differently: Take 0.25 mg by mouth in the morning, at noon, in the evening, and at bedtime.  08/03/19 08/23/19 Yes Tysinger, Camelia Eng, PA-C  cloNIDine (CATAPRES) 0.1 MG tablet Take 0.1 mg by mouth 2 (two) times daily.  08/09/19 08/08/20 Yes [provider]  hydrOXYzine (VISTARIL) 25 MG capsule Take 25 mg by mouth 3 (three) times daily as needed for anxiety.  07/13/19  Yes [provider]  naproxen (NAPROSYN) 375 MG tablet Take 375 mg by mouth 3 (three) times daily as needed for mild pain.   Yes [provider]  omeprazole (PRILOSEC) 40 MG capsule Take 1 capsule by mouth once daily Patient taking differently: Take 40 mg by mouth daily.  11/12/18  Yes Tysinger, Camelia Eng, PA-C  ondansetron (ZOFRAN) 4 MG tablet 1 tablet po q4 hours prn Patient taking differently: Take 4 mg by mouth every 4 (four) hours as needed (panic attack). 1 tablet po q4 hours prn 07/22/19  Yes Tysinger, Camelia Eng, PA-C  promethazine (PHENERGAN) 25 MG tablet Take 25 mg by mouth every 6 (six) hours as needed for nausea.  07/13/19  Yes [provider]  propranolol (INDERAL) 10 MG tablet  Take 1 tablet (10 mg total) by mouth 2 (two) times daily. 07/22/19 07/21/20 Yes Tysinger, Camelia Eng, PA-C  Vitamin D, Ergocalciferol, (DRISDOL) 1.25 MG (50000 UNIT) CAPS capsule Take 1 capsule (50,000 Units total) by mouth every 7 (seven) days. Patient taking differently: Take 50,000 Units by mouth every Wednesday.  07/22/19  Yes Tysinger, Camelia Eng, PA-C    Physical Exam:  Constitutional: NAD, calm, comfortable Vitals:   08/18/19 1954 08/18/19 2314 08/19/19 0142 08/19/19 0614  BP: 121/87 (!) 126/95 (!) 139/92   Pulse: 72 83 82   Resp: 18 16 18    Temp:      TempSrc:      SpO2: 98% 98% 100%   Weight:    77.1 kg  Height:    5\' 7"  (1.702 m)   Eyes: PERRL, lids and conjunctivae normal ENMT: Mucous membranes are moist. Posterior pharynx clear of any exudate or lesions.Normal dentition.  Neck: normal, supple, no masses, no  thyromegaly Respiratory: clear to auscultation bilaterally, no wheezing, no crackles. Normal respiratory effort. No accessory muscle use.  Cardiovascular: Regular rate and rhythm, no murmurs / rubs / gallops. No extremity edema. 2+ pedal pulses. No carotid bruits.  Abdomen: no tenderness, no masses palpated. No hepatosplenomegaly. Bowel sounds positive.  Musculoskeletal: no clubbing / cyanosis. No joint deformity upper and lower extremities. Good ROM, no contractures. Normal muscle tone.  Skin: no rashes, lesions, ulcers. No induration Neurologic: CN 2-12 grossly intact. Sensation intact, DTR normal. Strength 5/5 in all 4.  Psychiatric: Normal judgment and insight. Alert and oriented x 3. Normal mood.     Labs on Admission: I have personally reviewed following labs and imaging studies  CBC: Recent Labs  Lab 08/13/19 0449 08/14/19 0440 08/18/19 1325  WBC 5.9 5.5 7.9  NEUTROABS 2.9 2.6  --   HGB 14.1 14.8 16.9*  HCT 41.1 43.6 49.7*  MCV 93.4 93.0 91.4  PLT 196 215 366   Basic Metabolic Panel: Recent Labs  Lab 08/13/19 0449 08/14/19 0440 08/18/19 1325  NA 142  142 140  K 4.0 4.1 3.9  CL 109 110 106  CO2 24 25 24   GLUCOSE 104* 107* 127*  BUN 12 12 10   CREATININE 0.71 0.77 0.87  CALCIUM 9.3 9.6 9.7   GFR: Estimated Creatinine Clearance: 66.2 mL/min (by C-G formula based on SCr of 0.87 mg/dL). Liver Function Tests: Recent Labs  Lab 08/18/19 1325  AST 20  ALT 19  ALKPHOS 86  BILITOT 1.1  PROT 7.3  ALBUMIN 4.4   No results for input(s): LIPASE, AMYLASE in the last 168 hours. Recent Labs  Lab 08/19/19 0504  AMMONIA 17   Coagulation Profile: No results for input(s): INR, PROTIME in the last 168 hours. Cardiac Enzymes: No results for input(s): CKTOTAL, CKMB, CKMBINDEX, TROPONINI in the last 168 hours. BNP (last 3 results) No results for input(s): PROBNP in the last 8760 hours. HbA1C: No results for input(s): HGBA1C in the last 72 hours. CBG: No results for input(s): GLUCAP in the last 168 hours. Lipid Profile: No results for input(s): CHOL, HDL, LDLCALC, TRIG, CHOLHDL, LDLDIRECT in the last 72 hours. Thyroid Function Tests: Recent Labs    08/19/19 0503  TSH 1.906  FREET4 1.95*   Anemia Panel: No results for input(s): VITAMINB12, FOLATE, FERRITIN, TIBC, IRON, RETICCTPCT in the last 72 hours. Urine analysis:    Component Value Date/Time   COLORURINE YELLOW 08/19/2019 0445   APPEARANCEUR CLOUDY (A) 08/19/2019 0445   LABSPEC 1.003 (L) 08/19/2019 0445   LABSPEC 1.015 07/21/2019 1120   PHURINE 6.0 08/19/2019 0445   GLUCOSEU NEGATIVE 08/19/2019 0445   HGBUR SMALL (A) 08/19/2019 0445   BILIRUBINUR NEGATIVE 08/19/2019 0445   BILIRUBINUR negative 07/21/2019 1120   BILIRUBINUR NEG 11/26/2011 0931   [place] 08/19/2019 0445   PROTEINUR NEGATIVE 08/19/2019 0445   UROBILINOGEN negative 11/26/2011 0931   UROBILINOGEN 0.2 03/25/2011 0633   NITRITE NEGATIVE 08/19/2019 0445   LEUKOCYTESUR LARGE (A) 08/19/2019 0445   Sepsis Labs: Recent Results (from the past 240 hour(s))  SARS Coronavirus 2 by RT PCR (hospital order,  performed in [place] hospital lab) Nasopharyngeal Nasopharyngeal Swab     Status: None   Collection Time: 08/11/19 11:42 AM   Specimen: Nasopharyngeal Swab  Result Value Ref [place] Status   SARS Coronavirus 2 NEGATIVE NEGATIVE Final    Comment: (NOTE) SARS-CoV-2 target nucleic acids are NOT DETECTED.  The SARS-CoV-2 RNA is generally detectable in upper and lower respiratory specimens  during the acute phase of infection. The lowest concentration of SARS-CoV-2 viral copies this assay can detect is 250 copies / mL. A negative result does not preclude SARS-CoV-2 infection and should not be used as the sole basis for treatment or other patient management decisions.  A negative result may occur with improper specimen collection / handling, submission of specimen other than nasopharyngeal swab, presence of viral mutation(s) within the areas targeted by this assay, and inadequate number of viral copies (<250 copies / mL). A negative result must be combined with clinical observations, patient history, and epidemiological information.  Fact Sheet for Patients:   StrictlyIdeas.no  Fact Sheet for Healthcare Providers: BankingDealers.co.za  This test is not yet approved or  cleared by the [place] FDA and has been authorized for detection and/or diagnosis of SARS-CoV-2 by FDA under an Emergency Use Authorization (EUA).  This EUA will remain in effect (meaning this test can be used) for the duration of the COVID-19 declaration under Section 564(b)(1) of the Act, 21 U.S.C. section 360bbb-3(b)(1), unless the authorization is terminated or revoked sooner.  Performed at [facility], [place] [address], [place], [place] [address]   Urine Culture     Status: Abnormal   Collection Time: 08/11/19 11:42 AM   Specimen: Urine, Clean Catch  Result Value Ref Range Status   Specimen Description   Final    URINE, CLEAN  CATCH Performed at [facility], [place] [address] [place], [place] [address]    Special Requests   Final    NONE Performed at [facility], [place] [address] [place], [place] [address]    Culture (A)  Final    60,000 COLONIES/mL GROUP B STREP(S.AGALACTIAE)ISOLATED TESTING AGAINST S. AGALACTIAE NOT ROUTINELY PERFORMED DUE TO PREDICTABILITY OF AMP/PEN/VAN SUSCEPTIBILITY. Performed at [facility], [place] [address], [place], [place] [address]    Report Status 08/13/2019 FINAL  Final  SARS Coronavirus 2 by RT PCR (hospital order, performed in [facility] hospital lab) Nasopharyngeal Nasopharyngeal Swab     Status: None   Collection Time: 08/19/19  5:19 AM   Specimen: Nasopharyngeal Swab  Result Value Ref Range Status   SARS Coronavirus 2 NEGATIVE NEGATIVE Final    Comment: (NOTE) SARS-CoV-2 target nucleic acids are NOT DETECTED.  The SARS-CoV-2 RNA is generally detectable in upper and lower respiratory specimens during the acute phase of infection. The lowest concentration of SARS-CoV-2 viral copies this assay can detect is 250 copies / mL. A negative result does not preclude SARS-CoV-2 infection and should not be used as the sole basis for treatment or other patient management decisions.  A negative result may occur with improper specimen collection / handling, submission of specimen other than nasopharyngeal swab, presence of viral mutation(s) within the areas targeted by this assay, and inadequate number of viral copies (<250 copies / mL). A negative result must be combined with clinical observations, patient history, and epidemiological information.  Fact Sheet for Patients:   StrictlyIdeas.no  Fact Sheet for Healthcare Providers: BankingDealers.co.za  This test is not yet approved or  cleared by the [place] FDA and has been authorized for detection and/or diagnosis of SARS-CoV-2 by FDA  under an Emergency Use Authorization (EUA).  This EUA will remain in effect (meaning this test can be used) for the duration of the COVID-19 declaration under Section 564(b)(1) of the Act, 21 U.S.C. section 360bbb-3(b)(1), unless the authorization is terminated or revoked sooner.  Performed at [facility]  Lab, [address], [place],  [address]      Radiological Exams on Admission: No results found.  EKG: Independently reviewed.  Normal sinus rhythm at 77 bpm  Assessment/Plan Acute metabolic encephalopathy: Patient presents with reports of intermittent confusion seen with sensation, anxiety attacks, and shakes.  Last hospitalized for similar symptoms last month. -Admit to a medical telemetry bed  -neurochecks -Follow-up EEG monitoring -Follow-up telemetry overnight to ensure no signs of arrhythmia -Appreciate neurology consultative services, will follow-up for any further recommendation  Urinary tract infection: Acute.  Patient presented with abnormal urinalysis concerning for possible infection.  Previous cultures from 4 weeks ago grew out beta-hemolytic Streptococcus group B. -Follow-up urine culture -Continue Rocephin IV  Anxiety with intermittent panic attack: Patient's husband notes that she was started on a clonidine as well as Xanax for prior panic attacks.  Review of records shows that clonidine was recommended to be discontinued during last hospitalization, but it was not at that time. -Discontinued clonidine at this time. -Continue Xanax as needed for anxiety  Subclinical hyperthyroidism:  On admission TSH noted to be within normal limits at 1.906, but free still elevated T4 1.95.  This was also seen last month during last hospitalization, and patient had been started on propranolol 10 mg twice daily. -Continue propranolol -Discussed with Dr. Forde Dandy at [facility] who recommended checking T3 levels and if elevated significantly may warrant further  investigation.  Myalgias/arthralgia: Patient normally appears to be taking ibuprofen and naproxen at home for muscle aches and pains. -Advised patient's husband about naproxen and ibuprofen possibly putting patient at risk for stomach ulcers   -Trial of Voltaren gel  [facility] spotted fever: Review of previous labs from 7/7 RMSF IgM titers within normal limits and RMSF IgG noted to be positive suggests resolved infection. Lyme disease IGG and IGM titers negative.  GERD: Patient appears to be on omeprazole 40 mg daily. -Pharmacy substitution of Protonix 40 mg daily  DVT prophylaxis: lovenox Code Status: full  Family Communication: Husband updated at bedside Disposition Plan: To be determined Consults called: Neurology Admission status: Inpatient  Norval Morton MD [facility] Pager [phone]   If 7PM-7AM, please contact night-coverage [email or web address] Password [facility]  08/19/2019, 8:47 AM

## 2019-08-19 NOTE — ED Notes (Signed)
Sitter at bedside confused

## 2019-08-19 NOTE — Telephone Encounter (Signed)
Please call Hannah Padilla and see how the visit at behavioral health went yesterday?   I wanted to check in.

## 2019-08-19 NOTE — ED Notes (Signed)
Pt asking for baking soda to ease her abd pain or nausea  She feel like she is getting anxious

## 2019-08-19 NOTE — ED Notes (Signed)
She was sitting in a recliner beside the nurses desk when  I arrived  She is up walking with a [employer] cabin crew

## 2019-08-19 NOTE — Consult Note (Signed)
NEURO HOSPITALIST CONSULT NOTE   Requestig physician: Dr. Leonides Schanz  Reason for Consult: Intermittent confusion and tremors since June 4.   History obtained from:  Patient, Husband and Chart     HPI:                                                                                                                                          Hannah Padilla is an 69 y.o. female presenting to the ED for re-evaluation of anxiety attacks, tremor and intermittent confusion. Per husband, she had acute onset of behavioral and cognitive changes on June 4. She had been worked up this in early June, with positive RMSF titers and was started on doxycycline. Of note, she had not had neck stiffness or fever and also had not had a rash, but had recovered a non-engorged tick from her left thigh a few weeks prior to the onset of confusion on June 4. Her work up in June included negative Lyme titers.   She presented again to the ED on 7/6 for anxiety attacks, confusion and episodic events of panic. Also had trouble stringing together words. She had been admitted to the Hospitalist service at that time. She was diagnosed with encephalopathy secondary to UTI on a baseline of underlying anxiety. MRI brain was negative at that time. She was treated with 3 days of IV Rocephin, resulting in clinical improvement. She was maintained on Xanax and hydroxyzine per Psychiatry recommendations.   She re-presents today after evaluation by Psychiatry at behavioral health, after being sent there by [facility] for what was suspected to be worsened anxiety attacks. The Psychiatry service felt that she was not presenting with a primary Psychiatric condition and Neurology evaluation was requested. Workup in the ED this presentation reveals a UTI.   Of note, she has been grieving since losing her father about 3 months ago.   Detailed history from the ED PA's note early this AM was also reviewed: "69 year old female with  history of anxiety, metabolic encephalopathy, RMSF, vitamin D deficiency, tremors, presenting to the ED from behavioral health urgent care for altered mental status.  Husband reports symptoms have tremendously declined over the past month and a half.  He states she was completely normal, very active and now has chronic, debilitating anxiety.  Patient herself reports that initially this was only happening once or twice every 2 weeks, now is happening multiple times a day.  Husband reports she will state "another one is coming", become tremulous, hyperventilating, and becoming almost nonfunctional.  He states she has had a few episodes of confusion, applied hydrocortisone cream to her toothbrush, urinated in a trash can and cowboy hat as opposed to a toilet, etc.  She did have one fall about a week ago, however that  was mechanical and over the last step in their home.  Husband reports he pretty much stays with her 24/7 to monitor her as she cannot be left alone safely.  Patient has had recent hospitalization for same, diagnosed with UTI as well as RMSF with positive titers.  She completed full course of doxycycline as well as 3 days of IV Rocephin, however was not discharged home on any current antibiotics.  Patient denies any specific pain--no headache, neck pain, chest pain, shortness of breath, fever, or chills.  No urinary symptoms, however states urine has been dark and malodorous.  She is not had any vomiting or diarrhea.  Husband states she sparingly eats.  She has been sleeping intermittently, mostly after taking her Xanax.Husband hears a little frustrated which is understandable.  They were initially seen by PCP yesterday morning, sent to behavioral health urgent care as there seemed to be a need for inpatient hospitalization.  Once arrived there, husband reports he was told she would be admitted to a [facility], then they were sent here for medical evaluation and to be seen by neurology.  Patient  did have a negative MRI of her brain during last hospitalization but neurology consult was not pursued.  Husband states even despite treatment for her UTI and RMSF, she has not had any substantial improvement and he is not sure what else to do for her."  Past Medical History:  Diagnosis Date   Colon polyp 2015   Farsightedness    wears glasses   Urinary tract infection     Past Surgical History:  Procedure Laterality Date   BREAST REDUCTION SURGERY     CARPAL TUNNEL RELEASE     right   COLONOSCOPY  [address]   [place], [place]   lichenoid keratosis  09/05/11   Skin, left anterior shouldee   PARTIAL HYSTERECTOMY     still has ovaries   WISDOM TOOTH EXTRACTION      Family History  Problem Relation Age of Onset   Alzheimer's disease Mother    Heart disease Neg Hx    Diabetes Neg Hx    Cancer Neg Hx    Stroke Neg Hx    Hypertension Neg Hx    Hyperlipidemia Neg Hx               Social History:  reports that she has never smoked. She has never used smokeless tobacco. She reports current alcohol use of about 5.0 standard drinks of alcohol per week. She reports that she does not use drugs.  No Known Allergies  HOME MEDICATIONS:                                                                                                                      No current facility-administered medications on file prior to encounter.   Current Outpatient Medications on File Prior to Encounter  Medication Sig Dispense Refill   ALPRAZolam (XANAX) 0.5 MG tablet Take 1 tablet (0.5 mg total) by mouth 2 (  two) times daily as needed for up to 20 days for anxiety. (Patient taking differently: Take 0.25 mg by mouth in the morning, at noon, in the evening, and at bedtime. ) 40 tablet 0   cloNIDine (CATAPRES) 0.1 MG tablet Take 0.1 mg by mouth 2 (two) times daily.      hydrOXYzine (VISTARIL) 25 MG capsule Take 25 mg by mouth 3 (three) times daily as needed for anxiety.      naproxen (NAPROSYN) 375 MG  tablet Take 375 mg by mouth 3 (three) times daily as needed for mild pain.     omeprazole (PRILOSEC) 40 MG capsule Take 1 capsule by mouth once daily (Patient taking differently: Take 40 mg by mouth daily. ) 30 capsule 0   ondansetron (ZOFRAN) 4 MG tablet 1 tablet po q4 hours prn (Patient taking differently: Take 4 mg by mouth every 4 (four) hours as needed (panic attack). 1 tablet po q4 hours prn) 30 tablet 0   promethazine (PHENERGAN) 25 MG tablet Take 25 mg by mouth every 6 (six) hours as needed for nausea.      propranolol (INDERAL) 10 MG tablet Take 1 tablet (10 mg total) by mouth 2 (two) times daily. 60 tablet 1   Vitamin D, Ergocalciferol, (DRISDOL) 1.25 MG (50000 UNIT) CAPS capsule Take 1 capsule (50,000 Units total) by mouth every 7 (seven) days. (Patient taking differently: Take 50,000 Units by mouth every Wednesday. ) 12 capsule 1     ROS:                                                                                                                                       As per HPI. Does not endorse any additional symptoms.    Blood pressure (!) 139/92, pulse 82, temperature 98.5 F (36.9 C), temperature source Oral, resp. rate 18, SpO2 100 %.   General Examination:                                                                                                       Physical Exam  HEENT-  [place]/AT   Lungs- Respirations unlabored Extremities- No edema  Neurological Examination Mental Status: Awake and alert. Oriented to day of week, year, month, city and state. Speech fluent with intact comprehension and naming. Able to follow a 3 step command without difficulty. Able to register 3/3 items verbally presented to her, but recalls only one of them after a 3 minute delay; she can recall the other 2  with categorical cues. Was not able to recite the months of the year backwards, with first error after saying "October". Unable to correctly spell WORLD backwards. No dysarthria. Anxious  affect is noted.  Cranial Nerves: II: Visual fields intact. No extinction to DSS. PERRL.   III,IV, VI: No ptosis. EOMI and smooth without a saccadic component. No nystagmus.  V,VII: Smile symmetric, facial temp sensation equal bilaterally VIII: hearing intact to voice IX,X: No hypophonia  XI: Symmetric XII: midline tongue extension Motor: Right : Upper extremity   5/5    Left:     Upper extremity   5/5  Lower extremity   5/5     Lower extremity   5/5 No pronator drift Sensory: Subjectively decreased temp sensation to RUE and RLE. FT intact x 4. No extinction to DSS.  Deep Tendon Reflexes: 2+ biceps and brachioradialis bilaterally. 3+ patellae bilaterally. 1+ achilles bilaterally.  Plantars: Right: downgoing   Left: downgoing Cerebellar: No ataxia with FNF bilaterally. Intermittent tremulous movements of the BUE wax and wane, correlate with degree of expression of anxious affect, and are distractible.  Gait: Normal gait and station   Lab Results: Basic Metabolic Panel: Recent Labs  Lab 08/13/19 0449 08/14/19 0440 08/18/19 1325  NA 142 142 140  K 4.0 4.1 3.9  CL 109 110 106  CO2 24 25 24   GLUCOSE 104* 107* 127*  BUN 12 12 10   CREATININE 0.71 0.77 0.87  CALCIUM 9.3 9.6 9.7    CBC: Recent Labs  Lab 08/13/19 0449 08/14/19 0440 08/18/19 1325  WBC 5.9 5.5 7.9  NEUTROABS 2.9 2.6  --   HGB 14.1 14.8 16.9*  HCT 41.1 43.6 49.7*  MCV 93.4 93.0 91.4  PLT 196 215 267    Cardiac Enzymes: No results for input(s): CKTOTAL, CKMB, CKMBINDEX, TROPONINI in the last 168 hours.  Lipid Panel: No results for input(s): CHOL, TRIG, HDL, CHOLHDL, VLDL, LDLCALC in the last 168 hours.  Imaging: No results found.   Assessment: 69 year old female with intermittent confusion and tremors since June 4.  1. Overall presentation appears most likely to be Psychiatric. Although intermittent subclinical seizures are possible, this is felt to be unlikely.  2. Neurological exam is  nonlateralizing. She had deficits on testing of memory and concentration which may be due to poor effort, or early stage of cognitive decline. Of note, she has a family history of Alzheimer dementia.   Recommendations: 1. LTM EEG to assess for possible subclinical seizures (ordered).  2. Outpatient dementia work up at [facility].     Electronically signed: Dr. Kerney Elbe 08/19/2019, 5:22 AM

## 2019-08-19 NOTE — Progress Notes (Signed)
Unable to do EEG and LTM today as patient in ED hallway; will attempt patient tomorrow as schedule permits.

## 2019-08-19 NOTE — ED Notes (Signed)
Pt observed standing and when asked where she was going she stated "I guess out". Pt was assisted to sit in recliner, non-slip socks were applied and warm blanket given. Pt stood again and this NT asked what she was looking for and pt stated "her dog".

## 2019-08-20 ENCOUNTER — Encounter (HOSPITAL_COMMUNITY): Payer: Self-pay | Admitting: Internal Medicine

## 2019-08-20 ENCOUNTER — Inpatient Hospital Stay (HOSPITAL_COMMUNITY): Payer: Medicare Other

## 2019-08-20 DIAGNOSIS — R569 Unspecified convulsions: Secondary | ICD-10-CM

## 2019-08-20 DIAGNOSIS — G9341 Metabolic encephalopathy: Principal | ICD-10-CM

## 2019-08-20 LAB — CBC
HCT: 48.8 % — ABNORMAL HIGH (ref 36.0–46.0)
Hemoglobin: 16.6 g/dL — ABNORMAL HIGH (ref 12.0–15.0)
MCH: 31.5 pg (ref 26.0–34.0)
MCHC: 34 g/dL (ref 30.0–36.0)
MCV: 92.6 fL (ref 80.0–100.0)
Platelets: 230 10*3/uL (ref 150–400)
RBC: 5.27 MIL/uL — ABNORMAL HIGH (ref 3.87–5.11)
RDW: 12.1 % (ref 11.5–15.5)
WBC: 6 10*3/uL (ref 4.0–10.5)
nRBC: 0 % (ref 0.0–0.2)

## 2019-08-20 LAB — BASIC METABOLIC PANEL
Anion gap: 13 (ref 5–15)
BUN: 14 mg/dL (ref 8–23)
CO2: 24 mmol/L (ref 22–32)
Calcium: 9.8 mg/dL (ref 8.9–10.3)
Chloride: 104 mmol/L (ref 98–111)
Creatinine, Ser: 0.95 mg/dL (ref 0.44–1.00)
GFR calc Af Amer: >60 mL/min (ref >60)
GFR calc non Af Amer: >60 mL/min (ref >60)
Glucose, Bld: 110 mg/dL — ABNORMAL HIGH (ref 70–99)
Potassium: 4 mmol/L (ref 3.5–5.1)
Sodium: 141 mmol/L (ref 135–145)

## 2019-08-20 LAB — HIV ANTIBODY (ROUTINE TESTING W REFLEX): HIV Screen 4th Generation wRfx: NONREACTIVE

## 2019-08-20 LAB — URINE CULTURE: Culture: NO GROWTH

## 2019-08-20 MED ORDER — TRAZODONE HCL 50 MG PO TABS: 100.0000 mg | ORAL_TABLET | Freq: Every evening | ORAL | Status: DC | PRN

## 2019-08-20 MED ORDER — LORAZEPAM 2 MG/ML IJ SOLN
1.0000 mg | Freq: Once | INTRAMUSCULAR | Status: AC
  Administered 2019-08-20: 1 mg via INTRAVENOUS
  Filled 2019-08-20: qty 1

## 2019-08-20 MED ORDER — PENICILLIN G POT IN DEXTROSE 60000 UNIT/ML IV SOLN
3.0000 10*6.[IU] | INTRAVENOUS | Status: DC
  Administered 2019-08-20 – 2019-08-22 (×11): 3 10*6.[IU] via INTRAVENOUS
  Filled 2019-08-20 (×15): qty 50

## 2019-08-20 MED ORDER — TRAZODONE HCL 50 MG PO TABS
50.0000 mg | ORAL_TABLET | Freq: Every evening | ORAL | Status: DC | PRN
  Administered 2019-08-20 – 2019-08-21 (×2): 50 mg via ORAL
  Filled 2019-08-20 (×2): qty 1

## 2019-08-20 NOTE — Progress Notes (Signed)
Spot EEG done today is normal. No need for LTM EEG Do not think this is a primary neurological issue underlying her presentation. Please recall [place] as needed.  -- Amie Portland, MD [facility]

## 2019-08-20 NOTE — Procedures (Signed)
Patient Name: Hannah Padilla  MRN: [id number]  Epilepsy Attending: Lora Havens  Referring Physician/Provider: Dr Amie Portland Date: 08/20/2019 Duration: 24.11 mins  Patient history: 69 year old woman with intermittent episodes of tremors and confusion. EEG to evaluate for seizure  Level of alertness: Awake  AEDs during EEG study: None  Technical aspects: This EEG study was done with scalp electrodes positioned according to the 10-20 International system of electrode placement. Electrical activity was acquired at a sampling rate of 500Hz  and reviewed with a high frequency filter of 70Hz  and a low frequency filter of 1Hz . EEG data were recorded continuously and digitally stored.   Description: The posterior dominant rhythm consists of 9-10 Hz activity of moderate voltage (25-35 uV) seen predominantly in posterior head regions, symmetric and reactive to eye opening and eye closing. Drowsiness was characterized by attenuation of the posterior background Physiology photic driving was not seen during photic stimulation. Hyperventilation was not performed.     IMPRESSION: This study is within normal limits. No seizures or epileptiform discharges were seen throughout the recording.  [name] [name]

## 2019-08-20 NOTE — Plan of Care (Signed)
  Problem: Clinical Measurements: Goal: Ability to maintain clinical measurements within normal limits will improve Outcome: Progressing Goal: Will remain free from infection Outcome: Progressing   Problem: Activity: Goal: Risk for activity intolerance will decrease Outcome: Progressing   Problem: Nutrition: Goal: Adequate nutrition will be maintained Outcome: Progressing   Problem: Elimination: Goal: Will not experience complications related to bowel motility Outcome: Progressing Goal: Will not experience complications related to urinary retention Outcome: Progressing   Problem: Pain Managment: Goal: General experience of comfort will improve Outcome: Progressing   Problem: Safety: Goal: Ability to remain free from injury will improve Outcome: Progressing   Problem: Skin Integrity: Goal: Risk for impaired skin integrity will decrease Outcome: Progressing   Problem: Education: Goal: Knowledge of General Education information will improve Description: Including pain rating scale, medication(s)/side effects and non-pharmacologic comfort measures Outcome: Not Progressing   Problem: Health Behavior/Discharge Planning: Goal: Ability to manage health-related needs will improve Outcome: Not Progressing   Problem: Coping: Goal: Level of anxiety will decrease Outcome: Not Progressing
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
## 2019-08-20 NOTE — Progress Notes (Signed)
Pharmacy Antibiotic Note  Hannah Padilla is a 69 y.o. female admitted on 08/18/2019 with GBS UTI.  Pharmacy has been consulted for Penicillin G dosing. Per MD, pt with history of recurrent GBS UTI. Previously treated with ceftriaxone. MD wishes to start Pen G therapy.   Plan: Pen G 64million units IV Q4H D/c ceftriaxone Monitor renal function and adjust accordingly  Monitor for clinical improvement   Height: 5\' 7"  (170.2 cm) Weight: 74.1 kg (163 lb 5.8 oz) IBW/kg (Calculated) : 61.6  Temp (24hrs), Avg:97.7 F (36.5 C), Min:97.7 F (36.5 C), Max:97.7 F (36.5 C)  Recent Labs  Lab 08/14/19 0440 08/18/19 1325 08/20/19 0513  WBC 5.5 7.9 6.0  CREATININE 0.77 0.87 0.95    Estimated Creatinine Clearance: 59.6 mL/min (by C-G formula based on SCr of 0.95 mg/dL).    No Known Allergies  Antimicrobials this admission: Ceftriaxone x1>> 7/15   Microbiology results: 7/14 UCx: ngtd    Thank you for allowing pharmacy to be a part of this patients care.  Marcell Anger Royalty Domagala 08/20/2019 10:50 AM

## 2019-08-20 NOTE — Progress Notes (Signed)
EEG completed, results pending. 
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
## 2019-08-20 NOTE — Progress Notes (Signed)
While making rounds this afternoon, this patient appeared very anxious and was seeing things such as bugs on the bed. Her conversations were about driving the bus or riding down [facility] She is very confused and gets very anxious and fearful when her husband leaves. She could use a Air cabin crew when husband leaves but none are available. I have given the husband permission to stay until midnight and he will return early after he takes care of his pets at home. Patient does better when he is with her.  Cathlean Marseilles Tamala Julian, MSN, RN, [place], AGCNS

## 2019-08-20 NOTE — Plan of Care (Signed)
New admit

## 2019-08-21 LAB — T3: T3, Total: 114 ng/dL (ref 71–180)

## 2019-08-21 LAB — T3, FREE: T3, Free: 3.3 pg/mL (ref 2.0–4.4)

## 2019-08-21 MED ORDER — SIMETHICONE 40 MG/0.6ML PO SUSP
80.0000 mg | Freq: Once | ORAL | Status: AC
  Administered 2019-08-21: 80 mg via ORAL
  Filled 2019-08-21: qty 1.2

## 2019-08-21 MED ORDER — LORAZEPAM 2 MG/ML IJ SOLN
0.5000 mg | Freq: Once | INTRAMUSCULAR | Status: AC
  Administered 2019-08-21: 0.5 mg via INTRAVENOUS
  Filled 2019-08-21: qty 1

## 2019-08-21 MED ORDER — ALUM & MAG HYDROXIDE-SIMETH 200-200-20 MG/5ML PO SUSP
30.0000 mL | Freq: Once | ORAL | Status: AC
  Administered 2019-08-21: 30 mL via ORAL
  Filled 2019-08-21: qty 30

## 2019-08-21 NOTE — Plan of Care (Signed)
  Problem: Clinical Measurements: Goal: Ability to maintain clinical measurements within normal limits will improve Outcome: Progressing Goal: Will remain free from infection Outcome: Progressing Goal: Diagnostic test results will improve Outcome: Progressing Goal: Respiratory complications will improve Outcome: Progressing Goal: Cardiovascular complication will be avoided Outcome: Progressing   Problem: Activity: Goal: Risk for activity intolerance will decrease Outcome: Progressing   Problem: Nutrition: Goal: Adequate nutrition will be maintained Outcome: Progressing   Problem: Elimination: Goal: Will not experience complications related to bowel motility Outcome: Progressing Goal: Will not experience complications related to urinary retention Outcome: Progressing   Problem: Pain Managment: Goal: General experience of comfort will improve Outcome: Progressing   Problem: Safety: Goal: Ability to remain free from injury will improve Outcome: Progressing   Problem: Skin Integrity: Goal: Risk for impaired skin integrity will decrease Outcome: Progressing   Problem: Education: Goal: Knowledge of General Education information will improve Description: Including pain rating scale, medication(s)/side effects and non-pharmacologic comfort measures Outcome: Not Progressing   Problem: Health Behavior/Discharge Planning: Goal: Ability to manage health-related needs will improve Outcome: Not Progressing   Problem: Coping: Goal: Level of anxiety will decrease Outcome: Not Progressing

## 2019-08-21 NOTE — Consult Note (Signed)
[facility] [facility] Psychiatry Consult   Reason for Consult:  "Patient with underlying psychiatric disorder, currently undiagnosed, sent from behavioral health for evaluation, there is no underlying organic cause to explain her symptoms, her symptoms are more likely related to underlying psychiatric disorder, please evaluate ." Referring Physician:  Dr. Waldron Labs Patient Identification: Hannah Padilla MRN:  [id number] Principal Diagnosis: Acute metabolic encephalopathy Diagnosis:  Principal Problem:   Acute metabolic encephalopathy Active Problems:   Gastroesophageal reflux disease   Anxiety   UTI (urinary tract infection)   Subclinical hyperthyroidism   Total Time spent with patient: 45 minutes  Subjective: Patient states "I think this is related to my medical concern, a UTI, I am less anxious today and I have not had any hallucinations."  HPI: Hannah Padilla is a 69 y.o. female patient.  Patient assessed by nurse practitioner.  Patient alert and oriented, answers appropriately. Patient request that husband, Hannah Padilla, remain at bedside during assessment. Patient denies suicidal ideations, denies history of self-harm behaviors.  Patient denies homicidal ideations.  Patient denies symptoms of paranoia. Patient reports prior to today she had recently began "seeing bugs from the corner of her eye."  Patient denies auditory and visual hallucinations today. Patient resides with husband.  Patient denies access to weapons.  Patient is retired.  Patient denies alcohol and substance use.  Patient denies outpatient psychiatry.  Patient anxiety currently managed by primary care provider. Both patient and husband, at bedside, verbalized plan to follow-up with outpatient psychiatry. Patient offered support and encouragement. Patient's husband at bedside, [name] states: "She is so much improved in the last 24 hours."        Past Psychiatric History: Anxiety, panic attack  Risk to Self:    Denies Risk to Others:   Denies Prior Inpatient Therapy:   None reported Prior Outpatient Therapy:   None reported  Past Medical History:  Past Medical History:  Diagnosis Date  . Colon polyp 2015  . Farsightedness    wears glasses  . Urinary tract infection     Past Surgical History:  Procedure Laterality Date  . BREAST REDUCTION SURGERY    . CARPAL TUNNEL RELEASE     right  . COLONOSCOPY  [address]   [place], [place]  . lichenoid keratosis  09/05/11   Skin, left anterior shouldee  . PARTIAL HYSTERECTOMY     still has ovaries  . WISDOM TOOTH EXTRACTION     Family History:  Family History  Problem Relation Age of Onset  . Alzheimer's disease Mother   . Heart disease Neg Hx   . Diabetes Neg Hx   . Cancer Neg Hx   . Stroke Neg Hx   . Hypertension Neg Hx   . Hyperlipidemia Neg Hx    Family Psychiatric  History: Mother- Dementia Social History:  Social History   Substance and Sexual Activity  Alcohol Use Yes  . Alcohol/week: 5.0 standard drinks  . Types: 5 Glasses of wine per week   Comment: several days per week     Social History   Substance and Sexual Activity  Drug Use No    Social History   Socioeconomic History  . Marital status: Married    Spouse name: Not on file  . Number of children: Not on file  . Years of education: Not on file  . Highest education level: Not on file  Occupational History  . Not on file  Tobacco Use  . Smoking status: Never Smoker  . Smokeless tobacco: Never  Used  Vaping Use  . Vaping Use: Never used  Substance and Sexual Activity  . Alcohol use: Yes    Alcohol/week: 5.0 standard drinks    Types: 5 Glasses of wine per week    Comment: several days per week  . Drug use: No  . Sexual activity: Not Currently    Birth control/protection: Post-menopausal    Comment: 1st intercourse 68 yo-More than 5 partners  Other Topics Concern  . Not on file  Social History Narrative   Married, no children.  Retired, was working for [employer] as a  Geophysicist/field seismologist.   Exercise - not much.   07/2018.     Social Determinants of Health   Financial Resource Strain:   . Difficulty of Paying Living Expenses:   Food Insecurity:   . Worried About Charity fundraiser in the Last Year:   . Arboriculturist in the Last Year:   Transportation Needs:   . Film/video editor (Medical):   Marland Kitchen Lack of Transportation (Non-Medical):   Physical Activity:   . Days of Exercise per Week:   . Minutes of Exercise per Session:   Stress:   . Feeling of Stress :   Social Connections:   . Frequency of Communication with Friends and Family:   . Frequency of Social Gatherings with Friends and Family:   . Attends Religious Services:   . Active Member of Clubs or Organizations:   . Attends Archivist Meetings:   Marland Kitchen Marital Status:    Additional Social History:    Allergies:  No Known Allergies  Labs:  Results for orders placed or performed during the hospital encounter of 08/18/19 (from the past 48 hour(s))  T3, free     Status: None   Collection Time: 08/20/19  5:13 AM  Result Value Ref Range   T3, Free 3.3 2.0 - 4.4 pg/mL    Comment: (NOTE) Performed At: [facility] [place], [place] [id number] Rush Farmer MD [id number]   T3     Status: None   Collection Time: 08/20/19  5:13 AM  Result Value Ref Range   T3, Total 114 71 - 180 ng/dL    Comment: (NOTE) Performed At: [facility] [place], [place] [id number] Rush Farmer MD [id number]   HIV Antibody (routine testing w rflx)     Status: None   Collection Time: 08/20/19  5:13 AM  Result Value Ref Range   HIV Screen 4th Generation wRfx Non Reactive Non Reactive    Comment: Performed at [facility], [address], [place], [place] [address]  CBC     Status: Abnormal   Collection Time: 08/20/19  5:13 AM  Result Value Ref Range   WBC 6.0 4.0 - 10.5 K/uL   RBC 5.27 (H) 3.87 - 5.11 MIL/uL   Hemoglobin 16.6 (H) 12.0 - 15.0  g/dL   HCT 48.8 (H) 36 - 46 %   MCV 92.6 80.0 - 100.0 fL   MCH 31.5 26.0 - 34.0 pg   MCHC 34.0 30.0 - 36.0 g/dL   RDW 12.1 11.5 - 15.5 %   Platelets 230 150 - 400 K/uL   nRBC 0.0 0.0 - 0.2 %    Comment: Performed at [facility], [place] [address], [place], [place] [address]  Basic metabolic panel     Status: Abnormal   Collection Time: 08/20/19  5:13 AM  Result Value Ref Range   Sodium 141  135 - 145 mmol/L   Potassium 4.0 3.5 - 5.1 mmol/L   Chloride 104 98 - 111 mmol/L   CO2 24 22 - 32 mmol/L   Glucose, Bld 110 (H) 70 - 99 mg/dL    Comment: Glucose reference range applies only to samples taken after fasting for at least 8 hours.   BUN 14 8 - 23 mg/dL   Creatinine, Ser 0.95 0.44 - 1.00 mg/dL   Calcium 9.8 8.9 - 10.3 mg/dL   GFR calc non Af Amer >60 >60 mL/min   GFR calc Af Amer >60 >60 mL/min   Anion gap 13 5 - 15    Comment: Performed at [facility] [address], [place], [place] [address]    Current Facility-Administered Medications  Medication Dose Route Frequency Provider Last Rate Last Admin  . acetaminophen (TYLENOL) tablet 650 mg  650 mg Oral Q6H PRN Norval Morton, MD       Or  . acetaminophen (TYLENOL) suppository 650 mg  650 mg Rectal Q6H PRN Smith, Rondell A, MD      . albuterol (PROVENTIL) (2.5 MG/3ML) 0.083% nebulizer solution 2.5 mg  2.5 mg Nebulization Q6H PRN Fuller Plan A, MD      . ALPRAZolam Duanne Moron) tablet 0.25 mg  0.25 mg Oral QID PRN Fuller Plan A, MD   0.25 mg at 08/20/19 1613  . diclofenac Sodium (VOLTAREN) 1 % topical gel 2 g  2 g Topical QID Smith, Rondell A, MD      . enoxaparin (LOVENOX) injection 40 mg  40 mg Subcutaneous Q24H Smith, Rondell A, MD   40 mg at 08/21/19 0934  . naproxen (NAPROSYN) tablet 375 mg  375 mg Oral TID PRN Norval Morton, MD   375 mg at 08/19/19 2136  . pantoprazole (PROTONIX) EC tablet 40 mg  40 mg Oral Daily Fuller Plan A, MD   40 mg at 08/21/19 0934  . penicillin G potassium 3 Million Units in  dextrose 2mL IVPB  3 Million Units Intravenous Q4H Llana Aliment, RPH 100 mL/hr at 08/21/19 0936 3 Million Units at 08/21/19 0936  . propranolol (INDERAL) tablet 10 mg  10 mg Oral BID Fuller Plan A, MD   10 mg at 08/21/19 0934  . sodium chloride flush (NS) 0.9 % injection 3 mL  3 mL Intravenous Q12H Smith, Rondell A, MD   3 mL at 08/21/19 0935  . traZODone (DESYREL) tablet 50 mg  50 mg Oral QHS PRN Elgergawy, Silver Huguenin, MD   50 mg at 08/20/19 2318  . Vitamin D (Ergocalciferol) (DRISDOL) capsule 50,000 Units  50,000 Units Oral Q Wed Norval Morton, MD   50,000 Units at 08/19/19 1044    Musculoskeletal: Strength & Muscle Tone: within normal limits Gait & Station: normal Patient leans: N/A  Psychiatric Specialty Exam: Physical Exam Vitals and nursing note reviewed.  Constitutional:      Appearance: She is well-developed.  HENT:     Head: Normocephalic.  Cardiovascular:     Rate and Rhythm: Normal rate.  Pulmonary:     Effort: Pulmonary effort is normal.  Neurological:     Mental Status: She is alert and oriented to person, place, and time.  Psychiatric:        Attention and Perception: Attention and perception normal.        Mood and Affect: Mood and affect normal.        Speech: Speech normal.  Behavior: Behavior normal. Behavior is cooperative.        Thought Content: Thought content normal.        Cognition and Memory: Cognition and memory normal.        Judgment: Judgment normal.     Review of Systems  Constitutional: Negative.   HENT: Negative.   Eyes: Negative.   Respiratory: Negative.   Cardiovascular: Negative.   Gastrointestinal: Negative.   Genitourinary: Negative.   Musculoskeletal: Negative.   Skin: Negative.   Neurological: Negative.     Blood pressure 107/63, pulse 68, temperature 98.4 F (36.9 C), temperature source Oral, resp. rate 16, height 5\' 7"  (1.702 m), weight 74.1 kg, SpO2 100 %.Body mass index is 25.59 kg/m.  General Appearance:  Casual and Fairly Groomed  Eye Contact:  Good  Speech:  Clear and Coherent and Normal Rate  Volume:  Normal  Mood:  Euthymic  Affect:  Congruent  Thought Process:  Coherent, Goal Directed and Descriptions of Associations: Intact  Orientation:  Full (Time, Place, and Person)  Thought Content:  WDL and Logical  Suicidal Thoughts:  No  Homicidal Thoughts:  No  Memory:  Immediate;   Fair Recent;   Poor Remote;   Fair  Judgement:  Impaired  Insight:  Lacking  Psychomotor Activity:  Normal  Concentration:  Concentration: Fair and Attention Span: Fair  Recall:  [employer] of Knowledge:  Fair  Language:  Fair  Akathisia:  No  Handed:  Right  AIMS (if indicated):     Assets:  Communication Skills Desire for Improvement Financial Resources/Insurance Housing Intimacy Leisure Time Physical Health Resilience Social Support  ADL's:  Intact  Cognition:  WNL  Sleep:        Treatment Plan Summary: Patient reviewed with Dr. Hampton Abbot.   Patient is a 69 year old female, pleasant and cooperative during assessment.  Patient denies depressive symptoms, mood swings, delusions and psychosis.  Based on my evaluation today.  Patient could benefit from follow-up with outpatient psychiatry and talk therapy.  Recommendations: -Continue current alprazolam 0.25 mg 4 times daily as needed -Refer to outpatient psychiatry upon discharge for counseling and medication management  Disposition: No evidence of imminent risk to self or others at present.   Patient does not meet criteria for psychiatric inpatient admission. Supportive therapy provided about ongoing stressors.  Emmaline Kluver, FNP 08/21/2019 11:18 AM

## 2019-08-21 NOTE — Progress Notes (Signed)
PROGRESS NOTE                                                                                                                                                                                                             Patient Demographics:    Hannah Padilla, is a 69 y.o. female, DOB - [birth year], GUR:[id number]  Admit date - 08/18/2019   Admitting Physician Norval Morton, MD  Outpatient Primary MD for the patient is Tysinger, Leward Quan  LOS - 2   Chief Complaint  Patient presents with  . Abdominal Pain  . Altered Mental Status       Brief Narrative     HPI: Hannah Padilla is a 69 y.o. female with medical history significant of urinary tract infection, [place] spotted fever after tick bite, vitamin D deficiency, and colon polyps who presented with plaints of continued confusion and anxiety.  History is mostly obtained from the patient's husband who is present at bedside as the patient is currently resting.  She had just recently been hospitalized from 7/6-7/9, with acute metabolic encephalopathy, anxiety with intermittent panic attacks, [facility] spotted fever, and group B strep agalactiae urinary tract infection.  Patient had already completed her course of doxycycline and had been treated while in the hospital with 3 doses of IV Rocephin.  MRI of the brain without contrast showed no acute abnormalities.  Per review of discharge it appears it was recommended for the patient to stop clonidine.  Patient husband notes that he was unaware of this and they had continued it.  Clonidine had initially been started for her anxiety and seemed to help some and he notes that she never had a history of elevated blood pressures.  Since being home patient continues to have these anxiety attacks for which Xanax will help break the attack, but thereafter patient is unable to function.  He reports that she has been seeing things that are not there.  Noted to have had  conversations with her dead father.  Patient we will get extremely anxious and start shaking and appears that she is having a asthma attack and wobbly unable to sit down and restless.  She has been back-and-forth to be emergency department and ERs is recently being admitted and they have been unable to find the exact cause.  Associated symptoms  of muscle aches for which she is on naproxen and complaints of acid reflux.  Patient reportedly has not had any dysuria or urinary frequency.   He reports that she has a referral pending to [facility] scheduled for the 22nd of this month.   Husband states that before doing for patient never had any issues with anxiety or depression before June 4.  She had recently lost her father 2 to 3 months ago, but other than that and no other significant issues.   ED Course: Upon admission into the emergency department patient was seen to be afebrile with blood pressures 108/89-139/92, and all other vital signs maintained.  Labs from 7/13 significant for hemoglobin 16.9, TSH 1.906, and free T4 1.95.  UDS positive for benzodiazepine.  Urinalysis positive for large leukocytes, many bacteria, 21-50 RBCs, 21-50 squamous epithelial cells, and>50 WBCs.  Neurology have been formally consulted and recommended EEG monitoring.  Patient has been given Rocephin, Ativan, and Zofran.  [facility] called to admit.     Subjective:    Hannah Padilla today reports she had a good night sleep, husband at bedside, patient reports seeing insects walking on the bed and the wall, still insisting on seeing them despite her husband ensuring her they are not present and taking pictures by  phone to show her that.   Assessment  & Plan :    Principal Problem:   Acute metabolic encephalopathy Active Problems:   Gastroesophageal reflux disease   Anxiety   UTI (urinary tract infection)   Subclinical hyperthyroidism  Acute metabolic encephalopathy: - Patient presents with reports of intermittent confusion  seen with sensation, anxiety attacks, and shakes.  Last hospitalized for similar symptoms last month, this is very likely related to underlying psychiatric condition, and less likely to be primarily neurological issue or infectious process. -EEG was obtained, with no acute findings. -Have mild UTI which is being treated, and would not explain her current AMS, as well her mildly abnormal thyroid function tests 1 to explain these findings. -Psychiatry has been consulted for evaluation for underlining psychiatric illness.  Urinary tract infection: Acute.  -  Patient presented with abnormal urinalysis concerning for possible infection.  Previous cultures from 4 weeks ago grew out beta-hemolytic Streptococcus group B. -She has been treated with IV Rocephin, I will go ahead and switch to penicillin G given was treated in the past with IV Rocephin, and she still carries the same bacteria.  Anxiety with intermittent panic attack:  -Psychiatry has been consulted . patient's husband notes that she was started on a clonidine as well as Xanax for prior panic attacks.  Review of records shows that clonidine was recommended to be discontinued during last hospitalization, but it was not at that time. -Discontinued clonidine at this time. -Continue Xanax as needed for anxiety  Subclinical hyperthyroidism:  -  On admission TSH noted to be within normal limits at 1.906, but free still elevated T4 1.95.  This was also seen last month during last hospitalization, and patient had been started on propranolol 10 mg twice daily. -Continue propranolol -Admitting provider discussed with Dr. Forde Dandy at [facility] who recommended checking T3 levels and if elevated significantly may warrant further investigation, and her T3 level actually came within normal limit, so this is subclinical hyperthyroidism, and does not indicate any treatment currently.  Myalgias/arthralgia: Patient normally appears to be  taking ibuprofen and naproxen at home for muscle aches and pains. -Advised patient's husband about naproxen and ibuprofen possibly putting patient  at risk for stomach ulcers   -Trial of Voltaren gel  [facility] spotted fever:  -Review of previous labs from 7/7 RMSF IgM titers within normal limits and RMSF IgG noted to be positive suggests resolved infection. Lyme disease IGG and IGM titers negative. -There is no indication of acute infection as discussed with ID.  GERD: Patient appears to be on omeprazole 40 mg daily. -Pharmacy substitution of Protonix 40 mg daily  COVID-19 Labs  No results for input(s): DDIMER, FERRITIN, LDH, CRP in the last 72 hours.  Lab Results  Component Value Date   SARSCOV2NAA NEGATIVE 08/19/2019   [place] NEGATIVE 08/11/2019     Code Status : Full  Family Communication  : Husband at bedside  Disposition Plan  :  Status is: Inpatient  Remains inpatient appropriate because:Unsafe d/c plan and IV treatments appropriate due to intensity of illness or inability to take PO   Dispo: The patient is from: Home              Anticipated d/c is to: Home              Anticipated d/c date is: 1 day              Patient currently is medically stable to d/c.  Mentation remains altered.        Consults  :  Neurology  Procedures  : None  DVT Prophylaxis  :  Francisco lovenox  Lab Results  Component Value Date   PLT 230 08/20/2019    Antibiotics  :    Anti-infectives (From admission, onward)   Start     Dose/Rate Route Frequency Ordered Stop   08/20/19 1200  penicillin G potassium 3 Million Units in dextrose 36mL IVPB     Discontinue     3 Million Units 100 mL/hr over 30 Minutes Intravenous Every 4 hours 08/20/19 1057     08/20/19 0800  cefTRIAXone (ROCEPHIN) 1 g in sodium chloride 0.9 % 100 mL IVPB        1 g 200 mL/hr over 30 Minutes Intravenous  Once 08/19/19 0923 08/20/19 1010   08/19/19 0515  cefTRIAXone (ROCEPHIN) 1 g in sodium chloride 0.9  % 100 mL IVPB        1 g 200 mL/hr over 30 Minutes Intravenous  Once 08/19/19 0514 08/19/19 0625        Objective:   Vitals:   08/20/19 2004 08/20/19 2321 08/21/19 0423 08/21/19 1223  BP: 119/81 110/80 107/63 (!) 144/94  Pulse: 71 66 68 67  Resp: 18 18 16 17   Temp: 97.6 F (36.4 C) 98.1 F (36.7 C) 98.4 F (36.9 C) 98.7 F (37.1 C)  TempSrc: Axillary Oral Oral Oral  SpO2: 97% 97% 100% 99%  Weight:      Height:        Wt Readings from Last 3 Encounters:  08/20/19 74.1 kg  08/18/19 77.1 kg  08/18/19 74.9 kg     Intake/Output Summary (Last 24 hours) at 08/21/2019 1422 Last data filed at 08/21/2019 0940 Gross per 24 hour  Intake 440 ml  Output --  Net 440 ml     Physical Exam  Awake Alert, Oriented X 3, cognition and insight has improved today, but remains diminished. Symmetrical Chest wall movement, Good air movement bilaterally, CTAB RRR,No Gallops,Rubs or new Murmurs, No Parasternal Heave +ve B.Sounds, Abd Soft, No tenderness, No rebound - guarding or rigidity. No Cyanosis, Clubbing or edema, No new Rash or bruise  Data Review:    CBC Recent Labs  Lab 08/18/19 1325 08/20/19 0513  WBC 7.9 6.0  HGB 16.9* 16.6*  HCT 49.7* 48.8*  PLT 267 230  MCV 91.4 92.6  MCH 31.1 31.5  MCHC 34.0 34.0  RDW 12.0 12.1    Chemistries  Recent Labs  Lab 08/18/19 1325 08/20/19 0513  NA 140 141  K 3.9 4.0  CL 106 104  CO2 24 24  GLUCOSE 127* 110*  BUN 10 14  CREATININE 0.87 0.95  CALCIUM 9.7 9.8  AST 20  --   ALT 19  --   ALKPHOS 86  --   BILITOT 1.1  --    ------------------------------------------------------------------------------------------------------------------ No results for input(s): CHOL, HDL, LDLCALC, TRIG, CHOLHDL, LDLDIRECT in the last 72 hours.  No results found for: HGBA1C ------------------------------------------------------------------------------------------------------------------ Recent Labs    08/19/19 0503 08/20/19 0513   TSH 1.906  --   T3FREE  --  3.3   ------------------------------------------------------------------------------------------------------------------ No results for input(s): VITAMINB12, FOLATE, FERRITIN, TIBC, IRON, RETICCTPCT in the last 72 hours.  Coagulation profile No results for input(s): INR, PROTIME in the last 168 hours.  No results for input(s): DDIMER in the last 72 hours.  Cardiac Enzymes No results for input(s): CKMB, TROPONINI, MYOGLOBIN in the last 168 hours.  Invalid input(s): CK ------------------------------------------------------------------------------------------------------------------ No results found for: BNP  Inpatient Medications  Scheduled Meds: . diclofenac Sodium  2 g Topical QID  . enoxaparin (LOVENOX) injection  40 mg Subcutaneous Q24H  . pantoprazole  40 mg Oral Daily  . propranolol  10 mg Oral BID  . sodium chloride flush  3 mL Intravenous Q12H  . Vitamin D (Ergocalciferol)  50,000 Units Oral Q Wed   Continuous Infusions: . pencillin G potassium IV 3 Million Units (08/21/19 1212)   PRN Meds:.acetaminophen **OR** acetaminophen, albuterol, ALPRAZolam, naproxen, traZODone  Micro Results Recent Results (from the past 240 hour(s))  Urine culture     Status: None   Collection Time: 08/19/19  4:20 AM   Specimen: Urine, Clean Catch  Result Value Ref Range Status   Specimen Description URINE, CLEAN CATCH  Final   Special Requests NONE  Final   Culture   Final    NO GROWTH Performed at [facility], [place] [address], [place], [place] [address]    Report Status 08/20/2019 FINAL  Final  SARS Coronavirus 2 by RT PCR (hospital order, performed in [facility] hospital lab) Nasopharyngeal Nasopharyngeal Swab     Status: None   Collection Time: 08/19/19  5:19 AM   Specimen: Nasopharyngeal Swab  Result Value Ref Range Status   SARS Coronavirus 2 NEGATIVE NEGATIVE Final    Comment: (NOTE) SARS-CoV-2 target nucleic acids are NOT DETECTED.  The  SARS-CoV-2 RNA is generally detectable in upper and lower respiratory specimens during the acute phase of infection. The lowest concentration of SARS-CoV-2 viral copies this assay can detect is 250 copies / mL. A negative result does not preclude SARS-CoV-2 infection and should not be used as the sole basis for treatment or other patient management decisions.  A negative result may occur with improper specimen collection / handling, submission of specimen other than nasopharyngeal swab, presence of viral mutation(s) within the areas targeted by this assay, and inadequate number of viral copies (<250 copies / mL). A negative result must be combined with clinical observations, patient history, and epidemiological information.  Fact Sheet for Patients:   StrictlyIdeas.no  Fact Sheet for Healthcare Providers: BankingDealers.co.za  This test is not  yet approved or  cleared by the [place] and has been authorized for detection and/or diagnosis of SARS-CoV-2 by FDA under an Emergency Use Authorization (EUA).  This EUA will remain in effect (meaning this test can be used) for the duration of the COVID-19 declaration under Section 564(b)(1) of the Act, 21 U.S.C. section 360bbb-3(b)(1), unless the authorization is terminated or revoked sooner.  Performed at [facility], [place] [address] [place], [place] [address]     Radiology Reports MR BRAIN WO CONTRAST  Result Date: 08/11/2019 CLINICAL DATA:  Encephalopathy EXAM: MRI HEAD WITHOUT CONTRAST TECHNIQUE: Multiplanar, multiecho pulse sequences of the brain and surrounding structures were obtained without intravenous contrast. COMPARISON:  None. FINDINGS: Brain: There is no acute infarction or intracranial hemorrhage. There is no intracranial mass, mass effect, or edema. There is no hydrocephalus or extra-axial fluid collection. Ventricles and sulci are normal in size and configuration.  Patchy foci of T2 hyperintensity in the supratentorial white matter are nonspecific but may reflect mild chronic microvascular ischemic changes. Vascular: Major vessel flow voids at the skull base are preserved. Skull and upper cervical spine: Normal marrow signal is preserved. Sinuses/Orbits: Trace paranasal sinus mucosal thickening. Orbits are unremarkable. Other: Sella is unremarkable.  Mastoid air cells are clear. IMPRESSION: No evidence of recent infarction, hemorrhage, or mass. Mild chronic microvascular ischemic changes. Electronically Signed   By: Macy Mis M.D.   On: 08/11/2019 14:00   EEG adult  Result Date: 08/20/2019 Lora Havens, MD     08/20/2019 10:43 AM Patient Name: Hannah Padilla MRN: [id number] Epilepsy Attending: Lora Havens Referring Physician/Provider: Dr Amie Portland Date: 08/20/2019 Duration: 24.11 mins Patient history: 69 year old woman with intermittent episodes of tremors and confusion. EEG to evaluate for seizure Level of alertness: Awake AEDs during EEG study: None Technical aspects: This EEG study was done with scalp electrodes positioned according to the 10-20 International system of electrode placement. Electrical activity was acquired at a sampling rate of 500Hz  and reviewed with a high frequency filter of 70Hz  and a low frequency filter of 1Hz . EEG data were recorded continuously and digitally stored. Description: The posterior dominant rhythm consists of 9-10 Hz activity of moderate voltage (25-35 uV) seen predominantly in posterior head regions, symmetric and reactive to eye opening and eye closing. Drowsiness was characterized by attenuation of the posterior background Physiology photic driving was not seen during photic stimulation. Hyperventilation was not performed.   IMPRESSION: This study is within normal limits. No seizures or epileptiform discharges were seen throughout the recording. Priyanka Reynolds Bowl Chrisoula Zegarra M.D on 08/21/2019 at 2:22  PM    [facility] -  Office  [phone]

## 2019-08-22 LAB — BASIC METABOLIC PANEL
Anion gap: 12 (ref 5–15)
BUN: 9 mg/dL (ref 8–23)
CO2: 21 mmol/L — ABNORMAL LOW (ref 22–32)
Calcium: 9.8 mg/dL (ref 8.9–10.3)
Chloride: 106 mmol/L (ref 98–111)
Creatinine, Ser: 0.76 mg/dL (ref 0.44–1.00)
GFR calc Af Amer: >60 mL/min (ref >60)
GFR calc non Af Amer: >60 mL/min (ref >60)
Glucose, Bld: 100 mg/dL — ABNORMAL HIGH (ref 70–99)
Potassium: 4.2 mmol/L (ref 3.5–5.1)
Sodium: 139 mmol/L (ref 135–145)

## 2019-08-22 LAB — CBC
HCT: 46.4 % — ABNORMAL HIGH (ref 36.0–46.0)
Hemoglobin: 15.8 g/dL — ABNORMAL HIGH (ref 12.0–15.0)
MCH: 31.2 pg (ref 26.0–34.0)
MCHC: 34.1 g/dL (ref 30.0–36.0)
MCV: 91.5 fL (ref 80.0–100.0)
Platelets: 220 10*3/uL (ref 150–400)
RBC: 5.07 MIL/uL (ref 3.87–5.11)
RDW: 12.1 % (ref 11.5–15.5)
WBC: 5.5 10*3/uL (ref 4.0–10.5)
nRBC: 0 % (ref 0.0–0.2)

## 2019-08-22 MED ORDER — ONDANSETRON HCL 4 MG PO TABS
4.0000 mg | ORAL_TABLET | Freq: Once | ORAL | Status: AC
  Administered 2019-08-22: 4 mg via ORAL
  Filled 2019-08-22: qty 1

## 2019-08-22 MED ORDER — ALPRAZOLAM 0.5 MG PO TABS: 0.5000 mg | ORAL_TABLET | Freq: Two times a day (BID) | ORAL | 0 refills | Status: AC | PRN

## 2019-08-22 MED ORDER — AMOXICILLIN 500 MG PO CAPS
500.0000 mg | ORAL_CAPSULE | Freq: Three times a day (TID) | ORAL | Status: DC
  Administered 2019-08-22: 500 mg via ORAL
  Filled 2019-08-22 (×2): qty 1

## 2019-08-22 MED ORDER — AMOXICILLIN 500 MG PO CAPS: 500.0000 mg | ORAL_CAPSULE | Freq: Three times a day (TID) | ORAL | 0 refills | Status: AC

## 2019-08-22 MED ORDER — ACETAMINOPHEN 325 MG PO TABS: 650.0000 mg | ORAL_TABLET | Freq: Four times a day (QID) | ORAL | Status: AC | PRN

## 2019-08-22 MED ORDER — HALOPERIDOL 1 MG PO TABS
2.0000 mg | ORAL_TABLET | Freq: Two times a day (BID) | ORAL | Status: DC
  Administered 2019-08-22: 2 mg via ORAL
  Filled 2019-08-22 (×2): qty 2

## 2019-08-22 MED ORDER — HALOPERIDOL 2 MG PO TABS: 2.0000 mg | ORAL_TABLET | Freq: Two times a day (BID) | ORAL | 0 refills | Status: DC

## 2019-08-22 NOTE — Social Work (Signed)
CSW gave pt mental health/ psychiatry resources. CSW explained that pt will have to contact places once home to arrange appointments. Pt thanked csw for resources.   Emeterio Reeve, Latanya Presser, [place] Social Worker [phone]

## 2019-08-22 NOTE — Discharge Instructions (Signed)
Follow with Primary MD Tysinger, Camelia Eng, PA-C in 7 days   Get CBC, CMP,  checked  by Primary MD next visit.    Activity: As tolerated with Full fall precautions use walker/cane & assistance as needed   Disposition Home    Diet: Heart Healthy  , with feeding assistance and aspiration precautions.  For Heart failure patients - Check your Weight same time everyday, if you gain over 2 pounds, or you develop in leg swelling, experience more shortness of breath or chest pain, call your Primary MD immediately. Follow Cardiac Low Salt Diet and 1.5 lit/day fluid restriction.   On your next visit with your primary care physician please Get Medicines reviewed and adjusted.   Please request your Prim.MD to go over all Hospital Tests and Procedure/Radiological results at the follow up, please get all Hospital records sent to your Prim MD by signing hospital release before you go home.   If you experience worsening of your admission symptoms, develop shortness of breath, life threatening emergency, suicidal or homicidal thoughts you must seek medical attention immediately by calling 911 or calling your MD immediately  if symptoms less severe.  You Must read complete instructions/literature along with all the possible adverse reactions/side effects for all the Medicines you take and that have been prescribed to you. Take any new Medicines after you have completely understood and accpet all the possible adverse reactions/side effects.   Do not drive, operating heavy machinery, perform activities at heights, swimming or participation in water activities or provide baby sitting services if your were admitted for syncope or siezures until you have seen by Primary MD or a Neurologist and advised to do so again.  Do not drive when taking Pain medications.    Do not take more than prescribed Pain, Sleep and Anxiety Medications  Special Instructions: If you have smoked or chewed Tobacco  in the last 2 yrs  please stop smoking, stop any regular Alcohol  and or any Recreational drug use.  Wear Seat belts while driving.   Please note  You were cared for by a hospitalist during your hospital stay. If you have any questions about your discharge medications or the care you received while you were in the hospital after you are discharged, you can call the unit and asked to speak with the hospitalist on call if the hospitalist that took care of you is not available. Once you are discharged, your primary care physician will handle any further medical issues. Please note that NO REFILLS for any discharge medications will be authorized once you are discharged, as it is imperative that you return to your primary care physician (or establish a relationship with a primary care physician if you do not have one) for your aftercare needs so that they can reassess your need for medications and monitor your lab values.

## 2019-08-22 NOTE — Discharge Summary (Signed)
Hannah Padilla, is a 69 y.o. female  DOB 1950/04/06  MRN [id number].  Admission date:  08/18/2019  Admitting Physician  Norval Morton, MD  Discharge Date:  08/22/2019   Primary MD  Tysinger, Camelia Eng, PA-C  Recommendations for primary care physician for things to follow:  -Patient will need to follow with psychiatry as an outpatient.   Admission Diagnosis  Metabolic encephalopathy [R48.54] Altered mental status, unspecified altered mental status type [O27.03] Acute metabolic encephalopathy [J00.93]   Discharge Diagnosis  Metabolic encephalopathy [G18.29] Altered mental status, unspecified altered mental status type [H37.16] Acute metabolic encephalopathy [R67.89]    Principal Problem:   Acute metabolic encephalopathy Active Problems:   Gastroesophageal reflux disease   Anxiety   UTI (urinary tract infection)   Subclinical hyperthyroidism      Past Medical History:  Diagnosis Date   Colon polyp 2015   Farsightedness    wears glasses   Urinary tract infection     Past Surgical History:  Procedure Laterality Date   BREAST REDUCTION SURGERY     CARPAL TUNNEL RELEASE     right   COLONOSCOPY  3810   [place],    lichenoid keratosis  09/05/11   Skin, left anterior shouldee   PARTIAL HYSTERECTOMY     still has ovaries   WISDOM TOOTH EXTRACTION         History of present illness and  Hospital Course:     Kindly see H&P for history of present illness and admission details, please review complete Labs, Consult reports and Test reports for all details in brief  HPI  from the history and physical done on the day of admission 08/19/2019  HPI: Hannah Padilla is a 69 y.o. female with medical history significant of urinary tract infection, [place] spotted fever after tick bite, vitamin D deficiency, and colon polyps who presented with plaints of continued confusion and anxiety.   History is mostly obtained from the patient's husband who is present at bedside as the patient is currently resting.  She had just recently been hospitalized from 7/6-7/9, with acute metabolic encephalopathy, anxiety with intermittent panic attacks, [facility] spotted fever, and group B strep agalactiae urinary tract infection.  Patient had already completed her course of doxycycline and had been treated while in the hospital with 3 doses of IV Rocephin.  MRI of the brain without contrast showed no acute abnormalities.  Per review of discharge it appears it was recommended for the patient to stop clonidine.  Patient husband notes that he was unaware of this and they had continued it.  Clonidine had initially been started for her anxiety and seemed to help some and he notes that she never had a history of elevated blood pressures.  Since being home patient continues to have these anxiety attacks for which Xanax will help break the attack, but thereafter patient is unable to function.  He reports that she has been seeing things that are not there.  Noted to have had conversations with her  dead father.  Patient we will get extremely anxious and start shaking and appears that she is having a asthma attack and wobbly unable to sit down and restless.  She has been back-and-forth to be emergency department and ERs is recently being admitted and they have been unable to find the exact cause.  Associated symptoms of muscle aches for which she is on naproxen and complaints of acid reflux.  Patient reportedly has not had any dysuria or urinary frequency.   He reports that she has a referral pending to [facility] scheduled for the 22nd of this month.   Husband states that before doing for patient never had any issues with anxiety or depression before June 4.  She had recently lost her father 2 to 3 months ago, but other than that and no other significant issues.   ED Course: Upon admission into the emergency department  patient was seen to be afebrile with blood pressures 108/89-139/92, and all other vital signs maintained.  Labs from 7/13 significant for hemoglobin 16.9, TSH 1.906, and free T4 1.95.  UDS positive for benzodiazepine.  Urinalysis positive for large leukocytes, many bacteria, 21-50 RBCs, 21-50 squamous epithelial cells, and>50 WBCs.  Neurology have been formally consulted and recommended EEG monitoring.  Patient has been given Rocephin, Ativan, and Zofran.  [facility] called to admit.   Hospital Course   Acute metabolic encephalopathy: - Patient presents with reports of intermittent confusionseen with sensation, anxiety attacks, andshakes. Last hospitalized for similar symptoms last month, this is very likely related to underlying psychiatric condition, and less likely to be primarily neurological issue or infectious process. -EEG was obtained, with no acute findings. -Have mild UTI which is being treated, and would not explain her current AMS, as well her mildly abnormal thyroid function tests would not explain these findings as well. -Psychiatry has been consulted there is no evidence of imminent risk, they do recommend to follow with psychiatry as an outpatient, I have discussed with Dr. Vevelyn Pat, patient still having some forms of visual hallucinations, being insects in the room, he does recommend low-dose haloperidol 2 mg p.o. twice daily and follow-up as an outpatient with psychiatry, I have discussed this with the husband and the patient, and both are agreeable to this plan.  Urinary tract infection: Acute.  - Patient presented with abnormal urinalysis concerning for possible infection. Previous cultures from 4 weeks ago grew out beta-hemolytic Streptococcus group B. -She has been treated with IV Rocephin, I so she was switched to penicillin G given was treated in the past with IV Rocephin, she will be discharged another 4 days of oral amoxicillin  Anxietywith intermittent panic attack:    -Psychiatry has been consulted . patient's husband notes that she was started on a clonidine as well as Xanax for prior panic attacks. Review of records shows that clonidine was recommended to be discontinued during last hospitalization,but it was not at that time. -Discontinued clonidine at this time. -Continue Xanax as needed for anxiety -As well patient with some visual hallucinations, so she was started on haloperidol after discussion with psychiatry.  Subclinical hyperthyroidism:  - On admission TSH noted to be within normal limits at 1.906, but free still elevated T4 1.95. This was also seen last month during last hospitalization, and patient had been started on propranolol 10 mg twice daily. -Continue propranolol -Admitting provider discussed with [facility]. [facility] who recommended checking T3 levelsand if elevated significantly may warrant furtherinvestigation, and her T3 level actually came within  normal limit, so this is subclinical hyperthyroidism, and does not indicate any treatment currently.  Myalgias/arthralgia: Patient normally appears to be taking ibuprofen and naproxen at home for muscle aches and pains. -Advised patient's husband about naproxen and ibuprofen possibly putting patient at risk for stomach ulcers -Trial of Voltaren gel during hospital stay  [facility] spotted fever:  -Review of previous labs from 7/7 RMSFIgM titers within normal limits andRMSF IgG noted to be positive suggests resolved infection. Lyme diseaseIGG and IGMtitersnegative. -There is no indication of acute infection as discussed with ID.  GERD: Patient appears to be on omeprazole 40 mg daily.     Discharge Condition:  Stable.   Follow UP   Follow-up Information    Carlena Hurl, PA-C Follow up in 1 week(s).   Specialty: Family Medicine Contact information: [address] [place] [address] [phone]                  Discharge Instructions  and  Discharge Medications     Discharge Instructions    Discharge instructions   Complete by: As directed    Follow with Primary MD Tysinger, Camelia Eng, PA-C in 7 days   Get CBC, CMP,  checked  by Primary MD next visit.    Activity: As tolerated with Full fall precautions use walker/cane & assistance as needed   Disposition Home    Diet: Heart Healthy  , with feeding assistance and aspiration precautions.  For Heart failure patients - Check your Weight same time everyday, if you gain over 2 pounds, or you develop in leg swelling, experience more shortness of breath or chest pain, call your Primary MD immediately. Follow Cardiac Low Salt Diet and 1.5 lit/day fluid restriction.   On your next visit with your primary care physician please Get Medicines reviewed and adjusted.   Please request your Prim.MD to go over all Hospital Tests and Procedure/Radiological results at the follow up, please get all Hospital records sent to your Prim MD by signing hospital release before you go home.   If you experience worsening of your admission symptoms, develop shortness of breath, life threatening emergency, suicidal or homicidal thoughts you must seek medical attention immediately by calling 911 or calling your MD immediately  if symptoms less severe.  You Must read complete instructions/literature along with all the possible adverse reactions/side effects for all the Medicines you take and that have been prescribed to you. Take any new Medicines after you have completely understood and accpet all the possible adverse reactions/side effects.   Do not drive, operating heavy machinery, perform activities at heights, swimming or participation in water activities or provide baby sitting services if your were admitted for syncope or siezures until you have seen by Primary MD or a Neurologist and advised to do so again.  Do not drive when taking Pain medications.    Do  not take more than prescribed Pain, Sleep and Anxiety Medications  Special Instructions: If you have smoked or chewed Tobacco  in the last 2 yrs please stop smoking, stop any regular Alcohol  and or any Recreational drug use.  Wear Seat belts while driving.   Please note  You were cared for by a hospitalist during your hospital stay. If you have any questions about your discharge medications or the care you received while you were in the hospital after you are discharged, you can call the unit and asked to speak with the hospitalist on call if the hospitalist that took  care of you is not available. Once you are discharged, your primary care physician will handle any further medical issues. Please note that NO REFILLS for any discharge medications will be authorized once you are discharged, as it is imperative that you return to your primary care physician (or establish a relationship with a primary care physician if you do not have one) for your aftercare needs so that they can reassess your need for medications and monitor your lab values.   Increase activity slowly   Complete by: As directed      Allergies as of 08/22/2019   No Known Allergies     Medication List    STOP taking these medications   cloNIDine 0.1 MG tablet Commonly known as: CATAPRES   hydrOXYzine 25 MG capsule Commonly known as: VISTARIL   naproxen 375 MG tablet Commonly known as: NAPROSYN   ondansetron 4 MG tablet Commonly known as: ZOFRAN   promethazine 25 MG tablet Commonly known as: PHENERGAN     TAKE these medications   acetaminophen 325 MG tablet Commonly known as: TYLENOL Take 2 tablets (650 mg total) by mouth every 6 (six) hours as needed for mild pain (or Fever >/= 101).   ALPRAZolam 0.5 MG tablet Commonly known as: XANAX Take 1 tablet (0.5 mg total) by mouth 2 (two) times daily as needed for up to 20 days for anxiety. What changed:   how much to take  when to take this   amoxicillin 500 MG  capsule Commonly known as: AMOXIL Take 1 capsule (500 mg total) by mouth every 8 (eight) hours for 4 days.   haloperidol 2 MG tablet Commonly known as: HALDOL Take 1 tablet (2 mg total) by mouth 2 (two) times daily.   omeprazole 40 MG capsule Commonly known as: PRILOSEC Take 1 capsule by mouth once daily   propranolol 10 MG tablet Commonly known as: INDERAL Take 1 tablet (10 mg total) by mouth 2 (two) times daily.   Vitamin D (Ergocalciferol) 1.25 MG (50000 UNIT) Caps capsule Commonly known as: DRISDOL Take 1 capsule (50,000 Units total) by mouth every 7 (seven) days. What changed: when to take this         Diet and Activity recommendation: See Discharge Instructions above   Consults obtained -  Psychiatry   Major procedures and Radiology Reports - PLEASE review detailed and final reports for all details, in brief -      MR BRAIN WO CONTRAST  Result Date: 08/11/2019 CLINICAL DATA:  Encephalopathy EXAM: MRI HEAD WITHOUT CONTRAST TECHNIQUE: Multiplanar, multiecho pulse sequences of the brain and surrounding structures were obtained without intravenous contrast. COMPARISON:  None. FINDINGS: Brain: There is no acute infarction or intracranial hemorrhage. There is no intracranial mass, mass effect, or edema. There is no hydrocephalus or extra-axial fluid collection. Ventricles and sulci are normal in size and configuration. Patchy foci of T2 hyperintensity in the supratentorial white matter are nonspecific but may reflect mild chronic microvascular ischemic changes. Vascular: Major vessel flow voids at the skull base are preserved. Skull and upper cervical spine: Normal marrow signal is preserved. Sinuses/Orbits: Trace paranasal sinus mucosal thickening. Orbits are unremarkable. Other: Sella is unremarkable.  Mastoid air cells are clear. IMPRESSION: No evidence of recent infarction, hemorrhage, or mass. Mild chronic microvascular ischemic changes. Electronically Signed   By: Macy Mis M.D.   On: 08/11/2019 14:00   EEG adult  Result Date: 08/20/2019 Lora Havens, MD     08/20/2019 10:43 AM Patient  Name: CARRINE KROBOTH MRN: [id number] Epilepsy Attending: Lora Havens Referring Physician/Provider: Dr Amie Portland Date: 08/20/2019 Duration: 24.11 mins Patient history: 69 year old woman with intermittent episodes of tremors and confusion. EEG to evaluate for seizure Level of alertness: Awake AEDs during EEG study: None Technical aspects: This EEG study was done with scalp electrodes positioned according to the 10-20 International system of electrode placement. Electrical activity was acquired at a sampling rate of 500Hz  and reviewed with a high frequency filter of 70Hz  and a low frequency filter of 1Hz . EEG data were recorded continuously and digitally stored. Description: The posterior dominant rhythm consists of 9-10 Hz activity of moderate voltage (25-35 uV) seen predominantly in posterior head regions, symmetric and reactive to eye opening and eye closing. Drowsiness was characterized by attenuation of the posterior background Physiology photic driving was not seen during photic stimulation. Hyperventilation was not performed.   IMPRESSION: This study is within normal limits. No seizures or epileptiform discharges were seen throughout the recording. Priyanka [name]    Micro Results     Recent Results (from the past 240 hour(s))  Urine culture     Status: None   Collection Time: 08/19/19  4:20 AM   Specimen: Urine, Clean Catch  Result Value Ref Range Status   Specimen Description URINE, CLEAN CATCH  Final   Special Requests NONE  Final   Culture   Final    NO GROWTH Performed at [facility], [address] [place], [place] [address]    Report Status 08/20/2019 FINAL  Final  SARS Coronavirus 2 by RT PCR (hospital order, performed in [facility] hospital lab) Nasopharyngeal Nasopharyngeal Swab     Status: None   Collection Time: 08/19/19  5:19 AM    Specimen: Nasopharyngeal Swab  Result Value Ref Range Status   SARS Coronavirus 2 NEGATIVE NEGATIVE Final    Comment: (NOTE) SARS-CoV-2 target nucleic acids are NOT DETECTED.  The SARS-CoV-2 RNA is generally detectable in upper and lower respiratory specimens during the acute phase of infection. The lowest concentration of SARS-CoV-2 viral copies this assay can detect is 250 copies / mL. A negative result does not preclude SARS-CoV-2 infection and should not be used as the sole basis for treatment or other patient management decisions.  A negative result may occur with improper specimen collection / handling, submission of specimen other than nasopharyngeal swab, presence of viral mutation(s) within the areas targeted by this assay, and inadequate number of viral copies (<250 copies / mL). A negative result must be combined with clinical observations, patient history, and epidemiological information.  Fact Sheet for Patients:   StrictlyIdeas.no  Fact Sheet for Healthcare Providers: BankingDealers.co.za  This test is not yet approved or  cleared by the [place] FDA and has been authorized for detection and/or diagnosis of SARS-CoV-2 by FDA under an Emergency Use Authorization (EUA).  This EUA will remain in effect (meaning this test can be used) for the duration of the COVID-19 declaration under Section 564(b)(1) of the Act, 21 U.S.C. section 360bbb-3(b)(1), unless the authorization is terminated or revoked sooner.  Performed at [facility], [place] [address], [place], [place] [address]        Today   Subjective:   Special Ranes today has no headache,no chest abdominal pain,no new weakness tingling or numbness, feels much better wants to go home today.   Objective:   Blood pressure 123/90, pulse 78, temperature 97.6 F (36.4 C), temperature source Oral, resp. rate  18, height 5\' 7"  (1.702 m), weight 74.1 kg, SpO2 100  %.  No intake or output data in the 24 hours ending 08/22/19 1337  Exam Awake Alert, Oriented x 3, No new F.N deficits, patient still reports some visual hallucinations.. Symmetrical Chest wall movement, Good air movement bilaterally, CTAB RRR,No Gallops,Rubs or new Murmurs, No Parasternal Heave +ve B.Sounds, Abd Soft, Non tender, No rebound -guarding or rigidity. No Cyanosis, Clubbing or edema, No new Rash or bruise  Data Review   CBC w Diff:  Lab Results  Component Value Date   WBC 5.5 08/22/2019   HGB 15.8 (H) 08/22/2019   HGB 16.7 (H) 07/21/2019   HCT 46.4 (H) 08/22/2019   HCT 50.0 (H) 07/21/2019   PLT 220 08/22/2019   PLT 300 07/21/2019   LYMPHOPCT 42 08/14/2019   MONOPCT 6 08/14/2019   EOSPCT 4 08/14/2019   BASOPCT 1 08/14/2019    CMP:  Lab Results  Component Value Date   NA 139 08/22/2019   NA 143 07/21/2019   K 4.2 08/22/2019   CL 106 08/22/2019   CO2 21 (L) 08/22/2019   BUN 9 08/22/2019   BUN 12 07/21/2019   CREATININE 0.76 08/22/2019   CREATININE 0.58 05/13/2013   PROT 7.3 08/18/2019   PROT 6.8 07/21/2019   ALBUMIN 4.4 08/18/2019   ALBUMIN 4.7 07/21/2019   BILITOT 1.1 08/18/2019   BILITOT 0.4 07/21/2019   ALKPHOS 86 08/18/2019   AST 20 08/18/2019   ALT 19 08/18/2019  .   Total Time in preparing paper work, data evaluation and todays exam - 26 minutes  Phillips Climes M.D on 08/22/2019 at 1:37 PM  [facility]   Office  [phone]

## 2019-08-24 ENCOUNTER — Ambulatory Visit (HOSPITAL_COMMUNITY)
Admission: EM | Admit: 2019-08-24 | Discharge: 2019-08-24 | Disposition: A | Discharge status: Home / Self Care | Payer: Medicare Other | Attending: Psychiatry | Admitting: Psychiatry

## 2019-08-24 ENCOUNTER — Telehealth: Payer: Self-pay | Admitting: Internal Medicine

## 2019-08-24 ENCOUNTER — Other Ambulatory Visit: Payer: Self-pay

## 2019-08-24 DIAGNOSIS — F410 Panic disorder [episodic paroxysmal anxiety] without agoraphobia: Secondary | ICD-10-CM | POA: Diagnosis not present

## 2019-08-24 DIAGNOSIS — F419 Anxiety disorder, unspecified: Secondary | ICD-10-CM

## 2019-08-24 NOTE — Discharge Instructions (Addendum)
Continue current medications as prescribed Keep scheduled appointments  You are encouraged to follow up with [facility] to schedule an intake appointment.   Chiropodist at  [email or web address] and select the option to "Request First Visit."  Once you complete this form, staff will contact you to schedule.  You should hear from them within 48-72 hours.    [facility]  Address: [address], [place], [place] [address] Phone: [phone]  [place].

## 2019-08-24 NOTE — BH Assessment (Signed)
Comprehensive Clinical Assessment (CCA) Screening, Triage and Referral Note  08/24/2019 Hannah Padilla [id number]   Patient is a 69 y.o. female with recent onset of anxiety and panic attacks beginning 07/10/19 who presents today due to continued episodes following d/c from [facility] on Saturday 08/22/19.  Patient was seen for the same on 7/13 by this [place] and Marvia Pickles, NP.  She was referred to [facility] for medical clearance and possible referral to geri-psych program.  Per records, it appears she was managed well on xanax 0.25 QID with the addition of haldol.  She was discharged with recommendation to follow up with PCP and continue medications as prescribed in the hospital.  Her PCP changed the dosing on the xanax, as patient found the 0.25 dosage was not improving symptoms during panic episodes.  She has had two episodes since she was discharged on Saturday.  Patient's PCP recommended adjusting xanax dose to 0.50 BID and this has been effective, however episodes have continued,  as the medication is dosed less frequently. Patient denies SI, HI and AVH.  She appears calm during assessment and states she had a dose of her prescribed xanax this morning during panic episode.    Patient's husband is present and is requesting support with locating a psychiatrist and scheduling an intake appointment.  He feels they have fallen through the cracks with coordinating outpatient follow up care.  He expressed frustration with being in a remote area with spotty phone and internet service.  He has had difficulty scheduling appointments, as providers have had difficulty reaching him.  LPC called several clinics, to include [facility] outpatient clinic.  They don't have openings in the near future.  [facility] has an opening, however they are only meeting virtually. Patient's husband was provided with referral information for [facility], as they are scheduling in person appointments and will be able to  get patient in within two weeks. Referral instructions reviewed with patient and her husband.      Disposition:  Per Marvia Pickles, NP patient does not meet inpatient criteria.  The recommendation is that patient follow up with a psychiatrist for medication management.  LPC reviewed referral process for [facility] with patient and patient's husband. Information included in the AVS to be provided to patient upon discharge.   Visit Diagnosis:    ICD-10-CM   1. Anxiety  F41.9   2. Panic attack  F41.0     Patient Reported Information How did you hear about [place]? Self   Referral name: Dorothea Ogle PA with [facility]   Referral phone number: No data recorded Whom do you see for routine medical problems? Primary Care   Practice/Facility Name: Dorothea Ogle PA with [facility]   Practice/Facility Phone Number: No data recorded  Name of Contact: No data recorded  Contact Number: No data recorded  Contact Fax Number: No data recorded  Prescriber Name: No data recorded  Prescriber Address (if known): No data recorded What Is the Reason for Your Visit/Call Today? Worsening anxiety with continued panic attacks even taking recommended medications.  How Long Has This Been Causing You Problems? 1-6 months  Have You Recently Been in Any Inpatient Treatment (Hospital/Detox/Crisis Center/28-Day Program)? No   Name/Location of Program/Hospital:No data recorded  How Long Were You There? No data recorded  When Were You Discharged? No data recorded Have You Ever Received Services From [facility] Before? Yes   Who Do You See at  [facility]? Patient admitted to [facility] several weeks ago.  Have You Recently Had Any Thoughts About Hurting Yourself? No   Are You Planning to Commit Suicide/Harm Yourself At This time?  No  Have you Recently Had Thoughts About [place]? No   Explanation: No data recorded Have You  Used Any Alcohol or Drugs in the Past 24 Hours? No   How Long Ago Did You Use Drugs or Alcohol?  No data recorded  What Did You Use and How Much? No data recorded What Do You Feel Would Help You the Most Today? Medication;Therapy  Do You Currently Have a Therapist/Psychiatrist? No   Name of Therapist/Psychiatrist: No data recorded  Have You Been Recently Discharged From Any Office Practice or Programs? No   Explanation of Discharge From Practice/Program:  No data recorded    CCA Screening Triage Referral Assessment Type of Contact: Face-to-Face   Is this Initial or Reassessment? No data recorded  Date Telepsych consult ordered in [facility]:  No data recorded  Time Telepsych consult ordered in [facility]:  No data recorded Patient Reported Information Reviewed? Yes   Patient Left Without Being Seen? No data recorded  Reason for Not Completing Assessment: No data recorded Collateral Involvement: Patient's husband is present and he provides collateral.  Does Patient Have a [place]? No data recorded  Name and Contact of Legal Guardian:  No data recorded If Minor and Not Living with Parent(s), Who has Custody? N/A  Is CPS involved or ever been involved? Never  Is APS involved or ever been involved? Never  Patient Determined To Be At Risk for Harm To Self or Others Based on Review of Patient Reported Information or Presenting Complaint? No   Method: No data recorded  Availability of Means: No data recorded  Intent: No data recorded  Notification Required: No data recorded  Additional Information for Danger to Others Potential:  No data recorded  Additional Comments for Danger to Others Potential:  No data recorded  Are There Guns or Other Weapons in Your Home?  No data recorded   Types of Guns/Weapons: No data recorded   Are These Weapons Safely Secured?                              No data recorded   Who Could Verify You Are Able To Have These Secured:    No data  recorded Do You Have any Outstanding Charges, Pending Court Dates, Parole/Probation? No data recorded Contacted To Inform of Risk of Harm To Self or Others: No data recorded Location of Assessment: [facility]  Does Patient Present under Involuntary Commitment? No   IVC Papers Initial File Date: No data recorded  [place] of Residence: [place]  Patient Currently Receiving the Following Services: Not Receiving Services   Determination of Need: Routine (7 days)   Options For Referral: Medication Management   Fransico Meadow

## 2019-08-24 NOTE — Telephone Encounter (Signed)
Pt was discharged from hospital on Saturday. I called to do a hospital follow-up call and husband says he is taking her to the new behavior health place on [address] you recommended. She has had panic attacks since being released and she needs some kind of relief as she has been dealing with this since June 4th he states. He thinks going to hospitals it is causing her more panic attacks so hes hoping to get some answers at the behavior place

## 2019-08-24 NOTE — ED Provider Notes (Signed)
Behavioral Health Medical Screening Exam  Hannah Padilla is a 69 y.o. female.  Patient presented to the [facility] today reporting that she is continued to have some panic attacks.  She reported having a panic attack yesterday evening and another 1 this morning.  She denies having any suicidal or homicidal ideations and denies any hallucinations.  Patient reports that she just wants to get her panic attacks to go away and she is just seeking help.  Patient states she is unsure if she has a psychiatry appointment scheduled anytime soon or therapist appointment and is requesting [place] to speak to her husband [name].  Patient reports that her panic attacks her very frightful to her and she does wants them to stop. Patient's husband, [name], is met with in the lobby for collateral information.  He continues to report that patient has been in the hospital numerous times over the last month and that she has been cleared for everything medically.  He reports that the patient has continued to have panic attacks and that his biggest concern right now is to make sure that she gets continue treatment.  He states that they were not told him anywhere to follow-up when they discharged from the hospital on Saturday.  He states that she has been taking her medications but unfortunately he stated that the Xanax at 0.5 mg can cause her to be a little disoriented, but when the patient was placed on 0.25 mg at the hospital he stated that it was ineffective for her anxiety.  He states that the biggest thing they are wanting is just to have a follow-up appointment with an outpatient psychiatrist and therapist so that his wife has similar to follow-up on a regular basis.  He denies the patient having any suicidal or homicidal ideations.  He feels safe taking the patient home with the continued home medications as well as outpatient follow-up appointments.  Total Time spent with patient: 30 minutes  Psychiatric Specialty  Exam  Presentation  General Appearance:Appropriate for Environment;Fairly Groomed  Eye Contact:Good  Speech:Clear and Coherent;Normal Rate  Speech Volume:Normal  Handedness:Right   Mood and Affect  Mood:Anxious  Affect:Congruent   Thought Process  Thought Processes:Coherent  Descriptions of Associations:Intact  Orientation:Full (Time, Place and Person)  Thought Content:WDL  Hallucinations:None  Ideas of Reference:None  Suicidal Thoughts:No  Homicidal Thoughts:No   Sensorium  Memory:Immediate Fair;Recent Fair;Remote Fair  Judgment:Fair  Insight:Fair   Executive Functions  Concentration:Good  Attention Span:Fair  [place]  Language:Good   Psychomotor Activity  Psychomotor Activity:Normal   Assets  Assets:Desire for Improvement;Communication Skills;Financial Resources/Insurance;Housing;Social Support;Transportation   Sleep  Sleep:Fair  Number of hours: No data recorded  Physical Exam: Physical Exam Vitals and nursing note reviewed.  Constitutional:      Appearance: She is well-developed.  Cardiovascular:     Rate and Rhythm: Normal rate.  Pulmonary:     Effort: Pulmonary effort is normal.  Musculoskeletal:        General: Normal range of motion.  Skin:    General: Skin is warm.  Neurological:     Mental Status: She is alert and oriented to person, place, and time.    Review of Systems  Constitutional: Negative.   HENT: Negative.   Eyes: Negative.   Respiratory: Negative.   Cardiovascular: Negative.   Gastrointestinal: Negative.   Genitourinary: Negative.   Musculoskeletal: Negative.   Skin: Negative.   Neurological: Negative.   Endo/Heme/Allergies: Negative.   Psychiatric/Behavioral: The patient  is nervous/anxious.    Blood pressure (!) 143/92, pulse 84, temperature 97.8 F (36.6 C), temperature source Tympanic, resp. rate 16, SpO2 99 %. There is no height or weight on file to calculate  BMI.  Musculoskeletal: Strength & Muscle Tone: within normal limits Gait & Station: normal Patient leans: N/A   Recommendations:  Based on my evaluation the patient does not appear to have an emergency medical condition.  [place], FNP 08/24/2019, 2:31 PM

## 2019-08-26 ENCOUNTER — Telehealth: Payer: Self-pay | Admitting: Medical

## 2019-08-26 ENCOUNTER — Other Ambulatory Visit: Payer: Self-pay | Admitting: Medical

## 2019-08-26 MED ORDER — DIAZEPAM 5 MG PO TABS
5.0000 mg | ORAL_TABLET | Freq: Two times a day (BID) | ORAL | 0 refills | Status: DC | PRN
Start: 2019-08-26 — End: 2019-09-04

## 2019-08-26 NOTE — Telephone Encounter (Signed)
Please get in touch with her husband [name] again.  I was talking to him earlier and we got cut off  I did reach out to him a treatment center.  They do have her information and they were supposed to contact her back within the next 48 hours as they advised for appointment time  In the meantime I would like her to begin diazepam twice daily instead of Xanax for the short-term.  Continue Haldol.  Maybe this will give her some temporary help with the panic attacks until she gets into see psychiatry    Alfredo Martinez is very frustrated.  After his last visit here we had referred over to the new behavioral health center.  He said that that urgent care visit was basically a quick screening to determine if she was in need of inpatient admission or not.  They then sent her back to the hospital where they waited 12 hours in the waiting room all the while she was having panic attacks.  She had the admission to the hospital, Haldol was started.  [name] still does not see a huge difference in her symptoms with the Haldol.  They called the hotline that was advised to them from the hospital for follow-up with mental health.  They were directed to [facility] treatment center in [place], they filled out information online and they are waiting a callback which is supposed to take place within 48 hours

## 2019-08-26 NOTE — Telephone Encounter (Signed)
Pt husband called and said pt is having another panic attack. Pt advised that Diazepam will be sent to the pharmacy and that he should stop the xanax temporary.

## 2019-08-27 ENCOUNTER — Ambulatory Visit: Payer: Medicare Other | Admitting: Gastroenterology

## 2019-08-27 ENCOUNTER — Telehealth: Payer: Self-pay | Admitting: Internal Medicine

## 2019-08-27 NOTE — Telephone Encounter (Signed)
I don't have a great answer.   Most people would feel calmed down to some extent after either Haldol or Valium.   This is something psychiatry needs to handle.   I called yesterday and [facility] is suppose to contact him within 48 hours of his application, which I think would be by today.   So please await that call.  I don't have any other great treatment options that we haven't already discussed.     The only other option TODAY, if for whatever reason mood treatment center doesn't call, he could take her to [facility] down town [place].  This is a walk in clinic and you are suppose to see a mental health provider as long as you get there before 3pm.  First come first serve.    Here are some general strategies: We discussed ways to deal with stress and anxiety. I recommend regular exercise such as taking a walk together outside or in the neighborhood I recommend taking some time to meditate on nothing, clearing the mind I recommend working on relaxation techniques such as deep breathing exercises in a comfortable position relaxing your body.  There are free Apps on the smart phone for this for example Consider getting a massage Journal or use diary to express your ideas on paper to cope with anxiety and stress Some people use aromatherapy such as lavender to relax Some people use herbal teas to help calm their mood Spend some time with animals or your pet if you have one

## 2019-08-27 NOTE — Telephone Encounter (Signed)
Pt was notified of results and will try to give the haldol more time and wait for mood treatment call. If he sees shes not better then will try [facility]

## 2019-08-27 NOTE — Telephone Encounter (Signed)
Pt's husband called and states that she has taken valium at 5am and haloperidol at 10am and she is starting to have the panic attack and anixety. Husband wants to know what needs to be done. She has not taken the xanax in over 24 hours. Please advise. He will call again in an hour if he hasn't heard back. He would like you to call him

## 2019-08-27 NOTE — Telephone Encounter (Signed)
Patient is unable to talk on phone at home. They don't have service where they live.

## 2019-08-29 DIAGNOSIS — R110 Nausea: Secondary | ICD-10-CM | POA: Diagnosis not present

## 2019-08-31 ENCOUNTER — Telehealth: Payer: Self-pay

## 2019-08-31 DIAGNOSIS — I700 Atherosclerosis of aorta: Secondary | ICD-10-CM | POA: Diagnosis not present

## 2019-08-31 DIAGNOSIS — E279 Disorder of adrenal gland, unspecified: Secondary | ICD-10-CM | POA: Diagnosis not present

## 2019-08-31 DIAGNOSIS — Z79899 Other long term (current) drug therapy: Secondary | ICD-10-CM | POA: Diagnosis not present

## 2019-08-31 DIAGNOSIS — K449 Diaphragmatic hernia without obstruction or gangrene: Secondary | ICD-10-CM | POA: Diagnosis not present

## 2019-08-31 DIAGNOSIS — R251 Tremor, unspecified: Secondary | ICD-10-CM | POA: Diagnosis not present

## 2019-08-31 DIAGNOSIS — N3000 Acute cystitis without hematuria: Secondary | ICD-10-CM | POA: Diagnosis not present

## 2019-08-31 NOTE — Telephone Encounter (Signed)
Mr. Hitch has called wanting to give an update from his wife hospital visit. Scheduling an appointment for ED follow up in 2 days.

## 2019-09-02 ENCOUNTER — Other Ambulatory Visit: Payer: Self-pay | Admitting: Medical

## 2019-09-02 ENCOUNTER — Ambulatory Visit (INDEPENDENT_AMBULATORY_CARE_PROVIDER_SITE_OTHER): Payer: Medicare Other | Admitting: Medical

## 2019-09-02 ENCOUNTER — Other Ambulatory Visit: Payer: Self-pay

## 2019-09-02 ENCOUNTER — Telehealth: Payer: Self-pay | Admitting: Medical

## 2019-09-02 ENCOUNTER — Encounter: Payer: Self-pay | Admitting: Medical

## 2019-09-02 VITALS — BP 146/98 | HR 112 | Ht 67.0 in | Wt 155.6 lb

## 2019-09-02 DIAGNOSIS — G9341 Metabolic encephalopathy: Secondary | ICD-10-CM

## 2019-09-02 DIAGNOSIS — R630 Anorexia: Secondary | ICD-10-CM | POA: Insufficient documentation

## 2019-09-02 DIAGNOSIS — R634 Abnormal weight loss: Secondary | ICD-10-CM | POA: Insufficient documentation

## 2019-09-02 DIAGNOSIS — R251 Tremor, unspecified: Secondary | ICD-10-CM

## 2019-09-02 DIAGNOSIS — M546 Pain in thoracic spine: Secondary | ICD-10-CM

## 2019-09-02 DIAGNOSIS — R110 Nausea: Secondary | ICD-10-CM | POA: Diagnosis not present

## 2019-09-02 DIAGNOSIS — E278 Other specified disorders of adrenal gland: Secondary | ICD-10-CM | POA: Diagnosis not present

## 2019-09-02 DIAGNOSIS — F410 Panic disorder [episodic paroxysmal anxiety] without agoraphobia: Secondary | ICD-10-CM

## 2019-09-02 DIAGNOSIS — E0590 Thyrotoxicosis, unspecified without thyrotoxic crisis or storm: Secondary | ICD-10-CM

## 2019-09-02 DIAGNOSIS — F419 Anxiety disorder, unspecified: Secondary | ICD-10-CM

## 2019-09-02 MED ORDER — GABAPENTIN-NAPROXEN CMPD KIT 5-10 % EX CREA: 1.0000 "application " | TOPICAL_CREAM | Freq: Two times a day (BID) | CUTANEOUS | 0 refills | Status: DC

## 2019-09-02 NOTE — Progress Notes (Signed)
Subjective: Chief Complaint  Patient presents with  . Follow-up   Here for follow-up from last visit  After her last visit here July 13, which was a follow-up from hospitalization and consult with [facility] behavioral health, we tried to get her established with psychiatry and counseling.  After last visit today follow the instructions given by mental health to call [facility].  Husband called, did the online application and was supposed to get a call back in 48 hours for an appointment per their phone line.  Unfortunately it has been more than a week now and still no appointment for her psychiatrist or counselor.  In the meantime they have been back to the hospital for ongoing anxiety symptoms, tremor, not feeling well, panic attacks  Despite Haldol twice daily, Valium twice daily she still does not feel improvement  With this recent hospitalization 1 new finding was CT scan showing adrenal mass.  She was advised by hospitalist to have a metanephrine test as an outpatient to evaluate for pheochromocytoma  Due to the recent hospital visit she had to reschedule her new patient appointment with gastroenterology for chronic nausea.   Her new appointment will be late September.  She reports some new upper back pain in between the shoulder blades in the muscle.  No injury or trauma  No other new complaint  Past Medical History:  Diagnosis Date  . Colon polyp 2015  . Farsightedness    wears glasses  . Urinary tract infection    Current Outpatient Medications on File Prior to Visit  Medication Sig Dispense Refill  . acetaminophen (TYLENOL) 325 MG tablet Take 2 tablets (650 mg total) by mouth every 6 (six) hours as needed for mild pain (or Fever >/= 101).    . cephALEXin (KEFLEX) 500 MG capsule Take 500 mg by mouth 2 (two) times daily.    . diazepam (VALIUM) 5 MG tablet Take 1 tablet (5 mg total) by mouth every 12 (twelve) hours as needed for anxiety. 20  tablet 0  . haloperidol (HALDOL) 2 MG tablet Take 1 tablet (2 mg total) by mouth 2 (two) times daily. 60 tablet 0  . megestrol (MEGACE) 40 MG/ML suspension Take by mouth.    Mar[place] Kitchen omeprazole (PRILOSEC) 40 MG capsule Take 1 capsule by mouth once daily (Patient taking differently: Take 40 mg by mouth daily. ) 30 capsule 0  . ondansetron (ZOFRAN-ODT) 8 MG disintegrating tablet Take 8 mg by mouth every 8 (eight) hours as needed.    . propranolol (INDERAL) 10 MG tablet Take 1 tablet (10 mg total) by mouth 2 (two) times daily. 60 tablet 1  . Vitamin D, Ergocalciferol, (DRISDOL) 1.25 MG (50000 UNIT) CAPS capsule Take 1 capsule (50,000 Units total) by mouth every 7 (seven) days. (Patient taking differently: Take 50,000 Units by mouth every Wednesday. ) 12 capsule 1   No current facility-administered medications on file prior to visit.   ROS as in subjective   Objective: BP (!) 146/98   Pulse (!) 112   Ht 5\' 7"  (1.702 m)   Wt 155 lb 9.6 oz (70.6 kg)   SpO2 96%   BMI 24.37 kg/m   Gen: Anxious appearing similar to last visit, shakes at times, but does answer questions, poor eye contact Mild tenderness over the paraspinal muscles in the left and right upper back Arms nontender, normal range of motion  I reviewed the recent hospitalization notes from August 29, 2019.  She had a  CT abdomen pelvis showing a 7 mm x 7 mm left adrenal mass suggestive of pheochromocytoma or other.    Assessment: Encounter Diagnoses  Name Primary?  . Adrenal mass ([place]) Yes  . Tremor   . Subclinical hyperthyroidism   . Acute metabolic encephalopathy   . Metabolic encephalopathy   . Chronic nausea   . Panic attack   . Decrease in appetite   . Weight loss   . Anxiety   . Acute bilateral thoracic back pain      Plan: Adrenal mass-after reviewing hospitalization records, CT scan, we will do 24-hour urine for metanephrines test.  She will hold off on her propanolol the night before she starts to test and the 24 hours  during the test  Anxiety, panic attack-this is been frustrating for patient and [place] that she is yet to get a scheduled appointment for counseling and psychiatry for treatment.  She has been double back and forth between hospital, clinic in, urgent care for psychiatric intake but not counseling and treatment by psychiatry  To help her until she gets in with psychiatry I am having her increase the Haldol to 4 mg twice daily over the next few days to see if this makes any difference.  Continue Valium.  Continue propranolol except for the brief pause when she does the metanephrines test  Tremor, subclinical hypothyroidism, metabolic encephalopathy, panic attack, anxiety-reviewed recent hospital records, labs, imaging.  Medications reconciled.  Chronic nausea-she has gastroenterology establish care visit in September scheduled.  With everything else going on her nausea has not been as bad lately  Decrease in appetite, weight loss-Megace started this hospitalization  Back pain-spasm, strain.  Advise heat, massage.  [facility]  Subclinical hypothyroidism-continue to monitor   I will call to see if I can facilitate getting her psychiatric appointment  F/u pending call back

## 2019-09-02 NOTE — Telephone Encounter (Signed)
Please let [name] know that I was able to secure [facility] an appointment  [facility]  [address] [place], [place] [address]  [phone]  Monday August 9th, 3pm.  Dorna Mai, NP for medication management, 90 minute initial appt.     FYI  To help facilitate appointment for patient for pyschiatry and cousneling, I personally called the offices below today.    Here is what I found out today.....   I called Dr. Sofie Hartigan office to inquire about new patient appt.  Soonest appt with Dr. Toy Care is mid November  I called Dr. Launa Flight, psychiatry, and his phone just has recording to leave message which I did.  Will await call back  I called [facility].  They require new appointments to see therapist first, then they would decide on appt time for psychiatirst.  Soonest appt is late August, early September  I called [facility].  Best appt time for new psychiatry appointment is September.  They are not taking new patients for counseling.    I called [facility].  New patients currently are virtual walk ins.  I did tell [name] and [facility] about this option.  This is appointment talking to someone on a screen virtually but at the clinic.    I called the new [facility] outpatient clinic.  Soonest appt available with psychiatry is mid September.  Finally, I called back to [place], [phone].  With some persistence, I was able to get her this appt:

## 2019-09-02 NOTE — Patient Instructions (Addendum)
Recommendations:   For the next several days, lets increase the Haldol dose to see if this helps  Begin 2 tablets of the 2mg  = 4mg  twice daily for the next for seeable week.   Let me know if this seems to be helping with anxiety, tremors  Continue valium as your are doing for now  I would recommend continuing Propranolol as is for now, stop temporarily before and during the urine collection for the metanephrine test  We will work on getting the metanephrine 24 hour urine test  We will call and inquire about psychiatry appointment  Continue Megace as you are doing

## 2019-09-03 ENCOUNTER — Telehealth: Payer: Self-pay | Admitting: Medical

## 2019-09-03 NOTE — Telephone Encounter (Signed)
[facility] called regarding pain cream they will call back in the am since you was out of the office

## 2019-09-03 NOTE — Telephone Encounter (Signed)
Please read information below

## 2019-09-04 ENCOUNTER — Telehealth: Payer: Self-pay

## 2019-09-04 ENCOUNTER — Other Ambulatory Visit: Payer: Self-pay | Admitting: Medical

## 2019-09-04 DIAGNOSIS — G9341 Metabolic encephalopathy: Secondary | ICD-10-CM | POA: Diagnosis not present

## 2019-09-04 DIAGNOSIS — E278 Other specified disorders of adrenal gland: Secondary | ICD-10-CM | POA: Diagnosis not present

## 2019-09-04 DIAGNOSIS — R251 Tremor, unspecified: Secondary | ICD-10-CM | POA: Diagnosis not present

## 2019-09-04 MED ORDER — DIAZEPAM 5 MG PO TABS: 5.0000 mg | ORAL_TABLET | Freq: Two times a day (BID) | ORAL | 0 refills | Status: DC | PRN

## 2019-09-04 NOTE — Telephone Encounter (Signed)
Pt. Husband called stating that she needs a refill on her Diazepam she will run out on Sunday sent in to the [facility] in [place].

## 2019-09-04 NOTE — Addendum Note (Signed)
Addended by: Donnelly Stager on: 09/04/2019 09:47 AM   Modules accepted: Orders

## 2019-09-04 NOTE — Telephone Encounter (Signed)
[facility].  We are going to use a combination cream as ketoprofen, cyclobenzaprine baclofen and lidocaine topically.  Quantity agreed upon for BID dosing and they will contact patient

## 2019-09-08 LAB — METANEPHRINES, URINE, 24 HOUR
Metaneph Total, Ur: 63 ug/L
Metanephrines, 24H Ur: 98 ug/24 hr (ref 36–209)
Normetanephrine, 24H Ur: 231 ug/24 hr (ref 131–612)
Normetanephrine, Ur: 148 ug/L

## 2019-09-10 ENCOUNTER — Other Ambulatory Visit: Payer: Self-pay

## 2019-09-10 ENCOUNTER — Encounter: Payer: Self-pay | Admitting: Medical

## 2019-09-10 MED ORDER — MEGESTROL ACETATE 40 MG/ML PO SUSP: 200.0000 mg | Freq: Every day | ORAL | 0 refills | Status: DC

## 2019-09-10 MED ORDER — ONDANSETRON 8 MG PO TBDP: 8.0000 mg | ORAL_TABLET | Freq: Three times a day (TID) | ORAL | 0 refills | Status: DC | PRN

## 2019-09-11 ENCOUNTER — Telehealth: Payer: Self-pay | Admitting: Medical

## 2019-09-11 MED ORDER — HALOPERIDOL 2 MG PO TABS: ORAL_TABLET | ORAL | 0 refills | Status: DC

## 2019-09-11 NOTE — Telephone Encounter (Signed)
Called pt. Husband LM to let him know that the medication was sent in for his wife.

## 2019-09-11 NOTE — Telephone Encounter (Signed)
Pt's spouse, [name] called. He states that at pt's appt with Audelia Acton on 09/02/2019 her medication, Haldol was increased. I reviewed Shane's notes and it is documented that pt is to increase med to 2 pills twice aday. New rx was not sent in at that time and now pt is almost out. Please sent a new RX to pharmacy with new dosing. Pt uses [facility] in [place] and husband can be reached at [phone]. Sending to [facility] as Audelia Acton is out of office. Pt will be out of medication over the weekend.

## 2019-09-11 NOTE — Telephone Encounter (Signed)
Let him know that I called it in 

## 2019-09-21 NOTE — Telephone Encounter (Signed)
All of this information was sent to pt via text per request.

## 2019-09-29 ENCOUNTER — Encounter: Payer: Self-pay | Admitting: Medical

## 2019-09-29 ENCOUNTER — Ambulatory Visit (INDEPENDENT_AMBULATORY_CARE_PROVIDER_SITE_OTHER): Payer: Medicare Other | Admitting: Medical

## 2019-09-29 ENCOUNTER — Other Ambulatory Visit: Payer: Self-pay

## 2019-09-29 VITALS — BP 138/88 | HR 96 | Ht 67.0 in | Wt 146.0 lb

## 2019-09-29 DIAGNOSIS — R634 Abnormal weight loss: Secondary | ICD-10-CM | POA: Diagnosis not present

## 2019-09-29 DIAGNOSIS — K219 Gastro-esophageal reflux disease without esophagitis: Secondary | ICD-10-CM

## 2019-09-29 DIAGNOSIS — F419 Anxiety disorder, unspecified: Secondary | ICD-10-CM

## 2019-09-29 DIAGNOSIS — R159 Full incontinence of feces: Secondary | ICD-10-CM

## 2019-09-29 DIAGNOSIS — G479 Sleep disorder, unspecified: Secondary | ICD-10-CM

## 2019-09-29 DIAGNOSIS — F410 Panic disorder [episodic paroxysmal anxiety] without agoraphobia: Secondary | ICD-10-CM

## 2019-09-29 DIAGNOSIS — R32 Unspecified urinary incontinence: Secondary | ICD-10-CM

## 2019-09-29 DIAGNOSIS — G9341 Metabolic encephalopathy: Secondary | ICD-10-CM

## 2019-09-29 DIAGNOSIS — E278 Other specified disorders of adrenal gland: Secondary | ICD-10-CM | POA: Diagnosis not present

## 2019-09-29 DIAGNOSIS — R251 Tremor, unspecified: Secondary | ICD-10-CM

## 2019-09-29 DIAGNOSIS — R4182 Altered mental status, unspecified: Secondary | ICD-10-CM

## 2019-09-29 DIAGNOSIS — R630 Anorexia: Secondary | ICD-10-CM

## 2019-09-29 DIAGNOSIS — R110 Nausea: Secondary | ICD-10-CM

## 2019-09-29 DIAGNOSIS — E0590 Thyrotoxicosis, unspecified without thyrotoxic crisis or storm: Secondary | ICD-10-CM

## 2019-09-29 NOTE — Progress Notes (Signed)
Subjective: Chief Complaint  Patient presents with  . Consult    discuss seeing psychiatrist    Hannah Padilla is a 69 year old white female with history of chronic nausea, vitamin D deficiency, remote history of alcohol abuse, postmenopausal that was in normal state of health up until June of this year.  In June of this year she had acute symptoms that ultimately ended up in hospitalization with acute metabolic encephalopathy, panic attacks, anxiety, tremor, urinary tract infection, RMSF infection, weight loss, anorexia.  Leading up to today's visit we had a difficult time getting her seen by psychiatry and counseling after her hospital discharge  Here with husband [name] today.  [facility] area recently.   Saw provider Dorna Mai, PA.  He was concerned she may have some underlying neurological progressive issue, advised referral to neurology.  She has been having some fecal and urinary incontinence starting last week.  Haldol was discontinued gradually, has been off xanax for weeks.  Sertraline was begun and titrated up.  This was started at bedtime.  [name] wonders if the Sertraline taking at night is contributing to the incontinence.  Is still on Valium but they have been decreasing dosing.   She hasn't had as much panic attacks, but still having tremors noted at rest.  They are trying to walk some for exercise.  appetite is improved but husband is having to prompt or entire her to eat.  Still taking the megace, but dose  was decreased by Antony Haste.   No pain compliant  [name] says she is active, but not very responsive, not seeming connected to what is going on around her.   She always reports being tired.  doesn't sleep well.  She will focus on an idea, and will be focused on an murmur about that one issue for hours, such as the show [facility] lately.   Husband is now having to help her with all ADLs.  Husband notes all of these changes in  [place] started 07/10/2019.    Recently she hasn't had complaints of nausea or abdominal pain.  She had been on gaviscon prior but not having GI c/o lately.  Is suppose to see GI for establish care for chronic nausea and cancer screening in late September  [name] wants to double check urine today as she has been known to have UTIs in recent months including with hospitalization   Past Medical History:  Diagnosis Date  . Colon polyp 2015  . Farsightedness    wears glasses  . Mood change 2021   major change in affect, health issues with multiple ED visits, hospitalization, starting 07/10/2019  . RMSF [facility] spotted fever) 2021  . Urinary tract infection      Current Outpatient Medications on File Prior to Visit  Medication Sig Dispense Refill  . diazepam (VALIUM) 5 MG tablet Take 1 tablet (5 mg total) by mouth every 12 (twelve) hours as needed for anxiety. 20 tablet 0  . megestrol (MEGACE) 40 MG/ML suspension Take 5 mLs (200 mg total) by mouth daily. 240 mL 0  . omeprazole (PRILOSEC) 40 MG capsule Take 1 capsule by mouth once daily (Patient taking differently: Take 40 mg by mouth daily. ) 30 capsule 0  . ondansetron (ZOFRAN-ODT) 8 MG disintegrating tablet Take 1 tablet (8 mg total) by mouth every 8 (eight) hours as needed. 20 tablet 0  . propranolol (INDERAL) 10 MG tablet Take 1 tablet (10 mg total) by  mouth 2 (two) times daily. 60 tablet 1  . sertraline (ZOLOFT) 50 MG tablet Take 50 mg by mouth daily.    . Vitamin D, Ergocalciferol, (DRISDOL) 1.25 MG (50000 UNIT) CAPS capsule Take 1 capsule (50,000 Units total) by mouth every 7 (seven) days. (Patient taking differently: Take 50,000 Units by mouth every Wednesday. ) 12 capsule 1  . acetaminophen (TYLENOL) 325 MG tablet Take 2 tablets (650 mg total) by mouth every 6 (six) hours as needed for mild pain (or Fever >/= 101). (Patient not taking: Reported on 09/29/2019)     No current facility-administered medications on file prior to visit.       Objective BP 138/88   Pulse 96   Ht 5\' 7"  (1.702 m)   Wt 146 lb (66.2 kg)   SpO2 96%   BMI 22.87 kg/m   Wt Readings from Last 3 Encounters:  09/29/19 146 lb (66.2 kg)  09/02/19 155 lb 9.6 oz (70.6 kg)  08/20/19 163 lb 5.8 oz (74.1 kg)   BP Readings from Last 3 Encounters:  09/29/19 138/88  09/02/19 (!) 146/98  08/22/19 123/90   General well-developed well-nourished, no acute distress but seated and not talking very much in this interview She will answer some questions periodically but not very engaged in the conversation, staring off to 1 area of the room, quiet, when she does begins quieter than in prior visits before June There seems to be a resting tremor of her hands She does seem generally anxious Otherwise seated and quiet Husband does assist her walking which is more slower and guarded than prior to June    Assessment: Encounter Diagnoses  Name Primary?  Marland Kitchen Altered mental status, unspecified altered mental status type Yes  . Weight loss   . Decrease in appetite   . Adrenal mass ([place])   . Panic attack   . Tremor   . Sleep disturbance   . Anxiety   . Chronic nausea   . Gastroesophageal reflux disease, unspecified whether esophagitis present   . Subclinical hyperthyroidism   . Metabolic encephalopathy   . Incontinence of feces, unspecified fecal incontinence type   . Urinary incontinence, unspecified type      Plan: See prior visit notes.  She has a complicated health history in recent months.  In early June she started having unusual symptoms out of the blue, and since then has had numerous ED visits, more than 1 hospitalization, and several new diagnoses with these evaluations including acute metabolic encephalopathy, urinary tract infection, [place] spotted fever infection, severe anxiety and panic attacks, new tremor, major change in losing the ability to take care of ADLs, memory changes.  Of note husband reports Dail's mother started having  dementia diagnosed in her 36s.  Continue with psychiatry, referral to neurology for additional evaluation.  Anxiety, panic attacks, mood change since June-continue follow-up with psychiatry.  Fortunately she family got established with psychiatry within the last month.  She has had 2 visits with psychiatry, has had medication adjustments, and fortunately panic attacks are significantly improved  Decreased appetite-go back to 400 mg of Megace.  Husband will titrate back up  Tremor, overall change in mental status and health status in the past few months, recent hospitalization, quieter speech, new incontinence, slower movements overall-refer to neurology for evaluation, consider Parkinson's or other neurological pathology  Chronic nausea, weight loss, decreased appetite, new incontinence -continue plan to see gastroenterology but husband will call to see if they can move her appointment up  sooner  She was unable to give urinalysis sample today  Subclinical hyper thyroidism - plan to recheck labs in another month if neurology doesn't repeat labs first  Adrenal mass noted on CT abdomen pelvis in July.  Plan repeat in 6 to 12 months for surveillance   Serin was seen today for consult.  Diagnoses and all orders for this visit:  Altered mental status, unspecified altered mental status type -     Ambulatory referral to Neurology  Weight loss  Decrease in appetite  Adrenal mass ([place])  Panic attack  Tremor -     Ambulatory referral to Neurology  Sleep disturbance -     Ambulatory referral to Neurology  Anxiety -     Ambulatory referral to Neurology  Chronic nausea  Gastroesophageal reflux disease, unspecified whether esophagitis present  Subclinical hyperthyroidism  Metabolic encephalopathy -     Ambulatory referral to Neurology  Incontinence of feces, unspecified fecal incontinence type -     Ambulatory referral to Neurology  Urinary incontinence, unspecified type -      POCT Urinalysis DIP (Proadvantage Device)   Follow-up pending neurology referral

## 2019-10-05 ENCOUNTER — Encounter: Payer: Self-pay | Admitting: Medical

## 2019-10-05 ENCOUNTER — Ambulatory Visit (INDEPENDENT_AMBULATORY_CARE_PROVIDER_SITE_OTHER): Payer: Medicare Other | Admitting: Medical

## 2019-10-05 ENCOUNTER — Other Ambulatory Visit: Payer: Self-pay

## 2019-10-05 VITALS — BP 150/84 | HR 101 | Ht 67.0 in | Wt 144.4 lb

## 2019-10-05 DIAGNOSIS — E0590 Thyrotoxicosis, unspecified without thyrotoxic crisis or storm: Secondary | ICD-10-CM

## 2019-10-05 DIAGNOSIS — R159 Full incontinence of feces: Secondary | ICD-10-CM | POA: Diagnosis not present

## 2019-10-05 DIAGNOSIS — E860 Dehydration: Secondary | ICD-10-CM | POA: Diagnosis not present

## 2019-10-05 DIAGNOSIS — R634 Abnormal weight loss: Secondary | ICD-10-CM

## 2019-10-05 DIAGNOSIS — R7989 Other specified abnormal findings of blood chemistry: Secondary | ICD-10-CM | POA: Diagnosis not present

## 2019-10-05 DIAGNOSIS — R630 Anorexia: Secondary | ICD-10-CM

## 2019-10-05 DIAGNOSIS — R4182 Altered mental status, unspecified: Secondary | ICD-10-CM

## 2019-10-05 DIAGNOSIS — A770 Spotted fever due to Rickettsia rickettsii: Secondary | ICD-10-CM

## 2019-10-05 DIAGNOSIS — F419 Anxiety disorder, unspecified: Secondary | ICD-10-CM

## 2019-10-05 DIAGNOSIS — R32 Unspecified urinary incontinence: Secondary | ICD-10-CM

## 2019-10-05 DIAGNOSIS — R443 Hallucinations, unspecified: Secondary | ICD-10-CM | POA: Insufficient documentation

## 2019-10-05 DIAGNOSIS — R251 Tremor, unspecified: Secondary | ICD-10-CM

## 2019-10-05 DIAGNOSIS — G479 Sleep disorder, unspecified: Secondary | ICD-10-CM

## 2019-10-05 DIAGNOSIS — K219 Gastro-esophageal reflux disease without esophagitis: Secondary | ICD-10-CM

## 2019-10-05 DIAGNOSIS — R1310 Dysphagia, unspecified: Secondary | ICD-10-CM | POA: Insufficient documentation

## 2019-10-05 DIAGNOSIS — E559 Vitamin D deficiency, unspecified: Secondary | ICD-10-CM

## 2019-10-05 LAB — POCT URINALYSIS DIP (PROADVANTAGE DEVICE)
Bilirubin, UA: NEGATIVE
Blood, UA: NEGATIVE
Glucose, UA: NEGATIVE mg/dL
Ketones, POC UA: NEGATIVE mg/dL
Leukocytes, UA: NEGATIVE
Nitrite, UA: NEGATIVE
Protein Ur, POC: NEGATIVE mg/dL
Specific Gravity, Urine: 1.015
Urobilinogen, Ur: NEGATIVE
pH, UA: 7 (ref 5.0–8.0)

## 2019-10-05 LAB — BASIC METABOLIC PANEL
BUN/Creatinine Ratio: 13 (ref 12–28)
BUN: 9 mg/dL (ref 8–27)
CO2: 21 mmol/L (ref 20–29)
Calcium: 10.3 mg/dL (ref 8.7–10.3)
Chloride: 105 mmol/L (ref 96–106)
Creatinine, Ser: 0.67 mg/dL (ref 0.57–1.00)
GFR calc Af Amer: 104 mL/min/{1.73_m2} (ref >59)
GFR calc non Af Amer: 91 mL/min/{1.73_m2} (ref >59)
Glucose: 94 mg/dL (ref 65–99)
Potassium: 3.7 mmol/L (ref 3.5–5.2)
Sodium: 140 mmol/L (ref 134–144)

## 2019-10-05 LAB — CBC WITH DIFFERENTIAL/PLATELET
Basophils Absolute: 0 10*3/uL (ref 0.0–0.2)
Basos: 0 %
EOS (ABSOLUTE): 0 10*3/uL (ref 0.0–0.4)
Eos: 1 %
Hematocrit: 45.5 % (ref 34.0–46.6)
Hemoglobin: 16 g/dL — ABNORMAL HIGH (ref 11.1–15.9)
Lymphocytes Absolute: 1.8 10*3/uL (ref 0.7–3.1)
Lymphs: 23 %
MCH: 32.3 pg (ref 26.6–33.0)
MCHC: 35.2 g/dL (ref 31.5–35.7)
MCV: 92 fL (ref 79–97)
Monocytes Absolute: 0.5 10*3/uL (ref 0.1–0.9)
Monocytes: 7 %
Neutrophils Absolute: 5.6 10*3/uL (ref 1.4–7.0)
Neutrophils: 69 %
Platelets: 318 10*3/uL (ref 150–450)
RBC: 4.96 x10E6/uL (ref 3.77–5.28)
RDW: 15.2 % (ref 11.7–15.4)
WBC: 8 10*3/uL (ref 3.4–10.8)

## 2019-10-05 LAB — AMMONIA: Ammonia: 32 ug/dL — ABNORMAL LOW (ref 34–178)

## 2019-10-05 NOTE — Progress Notes (Signed)
Subjective: Chief Complaint  Patient presents with  . Urinary Retention    frequently feelinglike she has to go but does not [place]    Husband brings her in today for acute change in symptoms.  He provides the majority of the history.  She answers a few questions.  She has had several months of change in affect and health status starting in early June.    Hannah Padilla is a 69 year old white female with history of chronic nausea, vitamin D deficiency, remote history of alcohol abuse, postmenopausal that was in normal state of health up until June of this year.  In June of this year she had acute symptoms that ultimately ended up in hospitalization with acute metabolic encephalopathy, panic attacks, anxiety, tremor, urinary tract infection, RMSF infection, weight loss, anorexia.  Leading up to today's visit we had a difficult time getting her seen by psychiatry and counseling after her hospital discharge  Here with husband Hannah Padilla today.  They have [facility] with [facility] area recently.   Saw provider Dorna Mai, PA.  He was concerned she may have some underlying neurological progressive issue, advised referral to neurology.  She has been having some fecal and urinary incontinence starting last week.  Haldol was discontinued gradually, has been off xanax for weeks.  Sertraline was begun and titrated up.  This was started at bedtime.  Hannah Padilla wonders if the Sertraline taking at night is contributing to the incontinence.  Is still on Valium but they have been decreasing dosing.   She hasn't had as much panic attacks, but still having tremors noted at rest.  They are trying to walk some for exercise.  appetite is improved but husband is having to prompt or entire her to eat.  Still taking the megace, but dose  was decreased by Antony Haste.   Hannah Padilla says she is active, but not very responsive, not seeming connected to what is going on around her.   He brought her in today due to  some changes just in the past week since I last saw her.    Over the past several days she has been having urinary urgency, frequently asking husband to help her up calling out for him to need to go urinate but not much success.  No burning with urination, no blood in the urine, denies pain.  What is new is she has been calling out for people not in the house, speaking to her if deceased father, staring out in the space at times.  If they sit by the pool she will stare out at 1 thing and not seem to be phased by things around her.  She lies in bed with her eyes open most the time.  She will seem to focus on one idea once all over and over.  She does not ask for food.  Has been is having to prompt her to eat and spoon feed her.  She has had some difficulty with swallowing.  She continues to eat very little and still losing weight.  As of her last visit last week here we discussed that she has now established with psychiatry.  In recent weeks they have gotten her off some medications.  Off Haldol, off Xanax.  Her Valium dose is reduced.  She is on Zoloft 50 mg once daily.  Husband note she is able to answer questions, but lucid in the moment.  If he is not within sight of her she,  becomes  fearful, calling out, whimpering.  Last visit we made referral to neurology for concern for dementia or Parkinson's.  The first available appointment is 12/09/2019.   Past Medical History:  Diagnosis Date  . Colon polyp 2015  . Farsightedness    wears glasses  . Mood change 2021   major change in affect, health issues with multiple ED visits, hospitalization, starting 07/10/2019  . RMSF [facility] spotted fever) 2021  . Urinary tract infection    Current Outpatient Medications on File Prior to Visit  Medication Sig Dispense Refill  . diazepam (VALIUM) 5 MG tablet Take 1 tablet (5 mg total) by mouth every 12 (twelve) hours as needed for anxiety. 20 tablet 0  . megestrol (MEGACE) 40 MG/ML suspension Take  5 mLs (200 mg total) by mouth daily. 240 mL 0  . omeprazole (PRILOSEC) 40 MG capsule Take 1 capsule by mouth once daily (Patient taking differently: Take 40 mg by mouth daily. ) 30 capsule 0  . ondansetron (ZOFRAN-ODT) 8 MG disintegrating tablet Take 1 tablet (8 mg total) by mouth every 8 (eight) hours as needed. 20 tablet 0  . propranolol (INDERAL) 10 MG tablet Take 1 tablet (10 mg total) by mouth 2 (two) times daily. 60 tablet 1  . sertraline (ZOLOFT) 50 MG tablet Take 50 mg by mouth daily.    . Vitamin D, Ergocalciferol, (DRISDOL) 1.25 MG (50000 UNIT) CAPS capsule Take 1 capsule (50,000 Units total) by mouth every 7 (seven) days. (Patient taking differently: Take 50,000 Units by mouth every Wednesday. ) 12 capsule 1  . acetaminophen (TYLENOL) 325 MG tablet Take 2 tablets (650 mg total) by mouth every 6 (six) hours as needed for mild pain (or Fever >/= 101). (Patient not taking: Reported on 09/29/2019)     No current facility-administered medications on file prior to visit.   ROS as in subjective   Objective: BP (!) 150/84   Pulse (!) 101   Ht 5\' 7"  (1.702 m)   Wt 144 lb 6.4 oz (65.5 kg)   SpO2 93%   BMI 22.62 kg/m   Wt Readings from Last 3 Encounters:  10/05/19 144 lb 6.4 oz (65.5 kg)  09/29/19 146 lb (66.2 kg)  09/02/19 155 lb 9.6 oz (70.6 kg)   BP Readings from Last 3 Encounters:  10/05/19 (!) 150/84  09/29/19 138/88  09/02/19 (!) 146/98     General appearence: seems anxious, seated, but walked in assisted by husband, resting tremor noted right hand, losing weight since last visit  HEENT: normocephalic, sclerae anicteric, PERRLA, EOMi, nares patent, no discharge or erythema, pharynx normal Oral cavity: dry MM, no lesions Neck: supple, no lymphadenopathy, no thyromegaly, no masses Heart: RRR, normal S1, S2, no murmurs Lungs: CTA bilaterally, no wheezes, rhonchi, or rales Abdomen: +bs, soft, non tender, non distended, no masses, no hepatomegaly, no  splenomegaly Musculoskeletal: more rigid of UE and LE, was slow and guarded with gait upon entering room today, otherwise nontender, no swelling, no other obvious deformity Extremities: no edema, no cyanosis, no clubbing Pulses: 2+ symmetric, upper and lower extremities, normal cap refill Neurological: alert, oriented x 2, DTRs seemingly normal, somewhat cog wheeling with strength exam today UE and LE, not full smile but did have normal tongue to cheek, neck and shoulder shrug testing, eye brow raise seems symmetrical, unsteady on feet, requiring assistance to walk, resting tremor of right hand noted, PERRLA, she doesn't seem to follow with EOM in left lower fields.  Psychiatric: anxious, answers some questions  in brief words, but mostly staring in one place during interview      Assessment: Encounter Diagnoses  Name Primary?  Marland Kitchen Altered mental status, unspecified altered mental status type Yes  . Weight loss   . Decrease in appetite   . Incontinence of feces, unspecified fecal incontinence type   . Urinary incontinence, unspecified type   . RMSF [facility] spotted fever)   . Vitamin D deficiency   . Tremor   . Sleep disturbance   . Abnormal thyroid blood test   . Anxiety   . Subclinical hyperthyroidism   . Gastroesophageal reflux disease, unspecified whether esophagitis present   . Hallucination   . Dehydration   . Dysphagia, unspecified type       Plan: I reviewed the case with my supervising physician, Dr. Tomi Bamberger.  We discussed her complicated case starting with acute symptoms in June, multiple emergency department visits and multiple hospitalizations in the last few months.  We discussed her prior symptoms, prior treatment for UTI and [facility] spotted fever, we discussed the difficulty getting her into psychiatry and neurology.  We will try again to get her into neurology sooner as we suspect possible Parkinson's or other neurological issue, given the tremor, fecal and  urinary incontinence, muscle rigidity, trouble swallowing, gait change, changes in affect and memory.  She will continue to see psychiatry whom she recently establish care with  We will check some labs today some STAT.  She is dehydrated.  We did offer the option of going through the emergency department for IV fluids and evaluation there but they declined given that the last 2 times they went to [place] they waited over 12 hours and it was a very difficult and traumatizing experience with her panic attacks and anxiety  This husband will continue efforts to hydrate her throughout the day, throughout the hour.   Hannah Padilla was seen today for urinary retention.  Diagnoses and all orders for this visit:  Altered mental status, unspecified altered mental status type -     Basic metabolic panel -     CBC with Differential/Platelet -     Hepatic function panel -     TSH -     Urine Culture -     T4, free -     Ammonia  Weight loss -     Basic metabolic panel -     CBC with Differential/Platelet  Decrease in appetite -     Basic metabolic panel -     CBC with Differential/Platelet  Incontinence of feces, unspecified fecal incontinence type  Urinary incontinence, unspecified type -     Basic metabolic panel -     CBC with Differential/Platelet -     Urine Culture -     POCT Urinalysis DIP (Proadvantage Device)  RMSF [facility] spotted fever)  Vitamin D deficiency  Tremor  Sleep disturbance  Abnormal thyroid blood test -     TSH -     T4, free  Anxiety  Subclinical hyperthyroidism  Gastroesophageal reflux disease, unspecified whether esophagitis present  Hallucination -     Ammonia  Dehydration -     Basic metabolic panel -     CBC with Differential/Platelet -     Hepatic function panel -     TSH -     Urine Culture -     T4, free -     Ammonia  Dysphagia, unspecified type   F/u pending call back

## 2019-10-06 ENCOUNTER — Other Ambulatory Visit: Payer: Self-pay | Admitting: Medical

## 2019-10-06 DIAGNOSIS — R7989 Other specified abnormal findings of blood chemistry: Secondary | ICD-10-CM

## 2019-10-06 LAB — HEPATIC FUNCTION PANEL
ALT: 39 IU/L — ABNORMAL HIGH (ref 0–32)
AST: 28 IU/L (ref 0–40)
Albumin: 4.6 g/dL (ref 3.8–4.8)
Alkaline Phosphatase: 80 IU/L (ref 48–121)
Bilirubin Total: 1.3 mg/dL — ABNORMAL HIGH (ref 0.0–1.2)
Bilirubin, Direct: 0.34 mg/dL (ref 0.00–0.40)
Total Protein: 6.6 g/dL (ref 6.0–8.5)

## 2019-10-06 LAB — URINE CULTURE

## 2019-10-06 LAB — TSH: TSH: 0.832 u[IU]/mL (ref 0.450–4.500)

## 2019-10-06 LAB — T4, FREE: Free T4: 2.33 ng/dL — ABNORMAL HIGH (ref 0.82–1.77)

## 2019-10-06 MED ORDER — METHIMAZOLE 5 MG PO TABS: 5.0000 mg | ORAL_TABLET | Freq: Three times a day (TID) | ORAL | 0 refills | Status: DC

## 2019-10-06 MED ORDER — PROPRANOLOL HCL ER 60 MG PO CP24
60.0000 mg | ORAL_CAPSULE | Freq: Every day | ORAL | 1 refills | Status: DC
Start: 2019-10-06 — End: 2019-12-03

## 2019-10-08 ENCOUNTER — Ambulatory Visit: Payer: Medicare Other | Admitting: Neurology

## 2019-10-08 ENCOUNTER — Encounter: Payer: Self-pay | Admitting: Neurology

## 2019-10-08 ENCOUNTER — Other Ambulatory Visit: Payer: Self-pay

## 2019-10-08 ENCOUNTER — Telehealth: Payer: Self-pay | Admitting: Neurology

## 2019-10-08 VITALS — BP 139/94 | HR 89 | Ht 67.0 in | Wt 143.0 lb

## 2019-10-08 DIAGNOSIS — G9340 Encephalopathy, unspecified: Secondary | ICD-10-CM

## 2019-10-08 NOTE — Progress Notes (Signed)
Reason for visit: Rapidly progressive dementia  Referring physician: Dr. Rosario Jacks is a 69 y.o. female  History of present illness:  Hannah Padilla is a 69 year old right-handed white female with a history of a rapid decline in mental status over the last 2 months. The patient was functioning completely normally until early June 2021. The patient went to the emergency room on 10 July 2019 with a panic attack. She has had 2 admissions in July 2021 with altered mental status and severe anxiety. The patient underwent MRI of the brain without contrast that was unremarkable, an EEG study was done and was normal. The patient has had some blood work that included a thiamine level, HIV, ammonia level, urine drug screen, Lyme antibody panel, [facility] spotted fever panel. The patient was found to have [facility] spotted fever and was treated for this. The patient however has cognitively declined. She has been felt to have a subclinical hyperthyroidism issue, she has been on propranolol for this as well as for the anxiety. The patient recently was placed on Tapazole. The patient comes into the office with her husband today. The patient had been treated with Haldol but this was stopped within the last 2 weeks. The patient has developed a shuffling gait, tremor with the right hand, masking of the face, and significant problems with abulia. She will not initiate conversation or activities. She now requires assistance standing up, she requires assistance with bathing and dressing and feeding. Within the last 2 weeks she has had incontinence of the bowels and the bladder. She is not having overt hallucinations currently but she did earlier when she had a urinary tract infection. She currently is on Zoloft and low-dose diazepam. She has developed some short-term memory issues. She has rigidity of the arms and legs. She is sent to this office for further evaluation at this time.  Past Medical History:    Diagnosis Date  . Colon polyp 2015  . Elevated serum free T4 level    husband report  . Farsightedness    wears glasses  . Mood change 2021   major change in affect, health issues with multiple ED visits, hospitalization, starting 07/10/2019  . RMSF [facility] spotted fever) 2021  . Urinary tract infection     Past Surgical History:  Procedure Laterality Date  . BREAST REDUCTION SURGERY    . CARPAL TUNNEL RELEASE     right  . COLONOSCOPY  2229   [place], [place]  . lichenoid keratosis  09/05/11   Skin, left anterior shouldee  . PARTIAL HYSTERECTOMY     still has ovaries  . WISDOM TOOTH EXTRACTION      Family History  Problem Relation Age of Onset  . Alzheimer's disease Mother   . Other Father        cognitive decline  . COPD Father        smoker  . Protein S deficiency Other   . Heart disease Neg Hx   . Diabetes Neg Hx   . Cancer Neg Hx   . Stroke Neg Hx   . Hypertension Neg Hx   . Hyperlipidemia Neg Hx     Social history:  reports that she has never smoked. She has never used smokeless tobacco. She reports previous alcohol use of about 5.0 standard drinks of alcohol per week. She reports that she does not use drugs.  Medications:  Prior to Admission medications   Medication Sig Start Date End Date  Taking? Authorizing Provider  acetaminophen (TYLENOL) 325 MG tablet Take 2 tablets (650 mg total) by mouth every 6 (six) hours as needed for mild pain (or Fever >/= 101). 08/22/19  Yes Elgergawy, Silver Huguenin, MD  diazepam (VALIUM) 5 MG tablet Take 1 tablet (5 mg total) by mouth every 12 (twelve) hours as needed for anxiety. 09/04/19 09/03/20 Yes Tysinger, Camelia Eng, PA-C  DOCUSATE CALCIUM PO Take by mouth.   Yes [provider]  megestrol (MEGACE) 40 MG/ML suspension Take 5 mLs (200 mg total) by mouth daily. 09/10/19 09/09/20 Yes Tysinger, Camelia Eng, PA-C  methimazole (TAPAZOLE) 5 MG tablet Take 1 tablet (5 mg total) by mouth 3 (three) times daily. 10/06/19  Yes Tysinger, Camelia Eng,  PA-C  omeprazole (PRILOSEC) 40 MG capsule Take 1 capsule by mouth once daily Patient taking differently: Take 40 mg by mouth daily.  11/12/18  Yes Tysinger, Camelia Eng, PA-C  ondansetron (ZOFRAN-ODT) 8 MG disintegrating tablet Take 1 tablet (8 mg total) by mouth every 8 (eight) hours as needed. 09/10/19  Yes Tysinger, Camelia Eng, PA-C  propranolol ER (INDERAL LA) 60 MG 24 hr capsule Take 1 capsule (60 mg total) by mouth daily. 10/06/19 10/05/20 Yes Tysinger, Camelia Eng, PA-C  sertraline (ZOLOFT) 50 MG tablet Take 50 mg by mouth daily. 09/24/19  Yes [provider]  Vitamin D, Ergocalciferol, (DRISDOL) 1.25 MG (50000 UNIT) CAPS capsule Take 1 capsule (50,000 Units total) by mouth every 7 (seven) days. Patient taking differently: Take 50,000 Units by mouth every Wednesday.  07/22/19  Yes Tysinger, Camelia Eng, PA-C     No Known Allergies  ROS:  Out of a complete 14 system review of symptoms, the patient complains only of the following symptoms, and all other reviewed systems are negative.  Rapid cognitive decline Tremors Walking difficulty Anxiety  Blood pressure (!) 139/94, pulse 89, height 5\' 7"  (1.702 m), weight 143 lb (64.9 kg).  Physical Exam  General: The patient is alert and cooperative at the time of the examination.  Eyes: Pupils are equal, round, and reactive to light. Discs are flat bilaterally.  Neck: The neck is supple, no carotid bruits are noted.  Respiratory: The respiratory examination is clear.  Cardiovascular: The cardiovascular examination reveals a regular rate and rhythm, no obvious murmurs or rubs are noted.  Skin: Extremities are without significant edema.  Neurologic Exam  Mental status: The patient is alert and oriented x 3 at the time of the examination. The Mini-Mental status examination done today shows a total score 12/30.  Cranial nerves: Facial symmetry is present. There is good sensation of the face to pinprick and soft touch bilaterally. The strength of  the facial muscles and the muscles to head turning and shoulder shrug are normal bilaterally. Speech is well enunciated, no aphasia or dysarthria is noted. Extraocular movements are full. Visual fields are full. The tongue is midline, and the patient has symmetric elevation of the soft palate. No obvious hearing deficits are noted. Masking of the face is seen.  Motor: The motor testing reveals 5 over 5 strength of all 4 extremities. Increased motor tone is noted throughout.  Sensory: Sensory testing is notable for some decreased pinprick station on the right hand and right foot. Vibration sensation appears to be symmetric throughout, position sensation is difficult as the patient tends to perseverate. The same is true with double simultaneous stimulation.  Coordination: Cerebellar testing reveals that the patient is able to form finger-nose-finger with the left hand, she appears to  have visuospatial apraxia with the right hand and apraxia with the lower extremities and is unable to perform heel-to-shin on either side.  Gait and station: The patient requires assistance with standing, she has a shuffling gait, stooped posture, decreased arm swing. Intermittent tremors seen with the right upper extremity that is a resting tremor. Tandem gait was not attempted. Romberg is negative.  Reflexes: Deep tendon reflexes are symmetric and normal bilaterally. Toes are downgoing bilaterally.   MRI brain 08/11/19:  IMPRESSION: No evidence of recent infarction, hemorrhage, or mass. Mild chronic microvascular ischemic changes.  * MRI scan images were reviewed online. I agree with the written report.    Assessment/Plan:  1. Rapidly progressive dementia  2. Subclinical hyperthyroidism  3. Anxiety  The patient has had a very rapid alteration from a normal cognitive state to what appears to be a parkinsonian syndrome with severe abulia and cognitive decline. The patient will need to be evaluated for an  autoimmune disorder or JCD. The patient be set up for MRI of the brain with and without gadolinium enhancement, she will have blood work done today. A lumbar puncture will be done to include a paraneoplastic antibody panel. The patient will follow up in 6 weeks. If the patient continues to decline rapidly, hospitalization would be recommended.   The husband indicates that their cell phone reception is very poor, communication to them for appointment should be done through text, not direct call.  Jill Alexanders MD 10/08/2019 10:51 AM  [facility] [address] [place], [place] [address]  Phone [phone] Fax [phone]

## 2019-10-08 NOTE — Telephone Encounter (Signed)
[employer] medicare order sent to GI. No auth they will reach out to the patient to schedule.  

## 2019-10-10 ENCOUNTER — Ambulatory Visit
Admission: RE | Admit: 2019-10-10 | Discharge: 2019-10-10 | Disposition: A | Discharge status: Home / Self Care | Payer: Medicare Other | Source: Ambulatory Visit | Attending: Neurology | Admitting: Neurology

## 2019-10-10 DIAGNOSIS — G9340 Encephalopathy, unspecified: Secondary | ICD-10-CM

## 2019-10-10 MED ORDER — GADOBENATE DIMEGLUMINE 529 MG/ML IV SOLN
12.0000 mL | Freq: Once | INTRAVENOUS | Status: AC | PRN
  Administered 2019-10-10: 12 mL via INTRAVENOUS

## 2019-10-13 ENCOUNTER — Telehealth: Payer: Self-pay | Admitting: Neurology

## 2019-10-13 NOTE — Telephone Encounter (Signed)
I called the husband.  MRI does not show any significant abnormalities, stable from July 2021.  No cortical enhancement that would suggest JCD.  Most of the blood work results are in, the LEM[facility] antibodies are still pending.  Blood work is unremarkable so far.  Lumbar puncture is pending.   MRI brain 10/11/19:  IMPRESSION: This MRI of the brain with and without contrast shows the following: 1.   Mild generalized cortical atrophy, stable compared to the 08/11/2019 MRI. 2.   Scattered T2/FLAIR hyperintense foci, predominantly in the subcortical and deep white matter of the hemispheres, most consistent with chronic microvascular ischemic change.  This is stable compared to the 08/11/2019 MRI. 3.   Normal enhancement pattern no acute findings.

## 2019-10-15 ENCOUNTER — Other Ambulatory Visit: Payer: Self-pay

## 2019-10-15 ENCOUNTER — Other Ambulatory Visit (HOSPITAL_COMMUNITY)
Admission: RE | Admit: 2019-10-15 | Discharge: 2019-10-15 | Disposition: A | Discharge status: Home / Self Care | Payer: Medicare Other | Source: Ambulatory Visit | Attending: Neurology | Admitting: Neurology

## 2019-10-15 ENCOUNTER — Ambulatory Visit
Admission: RE | Admit: 2019-10-15 | Discharge: 2019-10-15 | Disposition: A | Discharge status: Home / Self Care | Payer: Medicare Other | Source: Ambulatory Visit | Attending: Neurology | Admitting: Neurology

## 2019-10-15 VITALS — BP 125/78 | HR 66

## 2019-10-15 DIAGNOSIS — G9340 Encephalopathy, unspecified: Secondary | ICD-10-CM | POA: Diagnosis not present

## 2019-10-15 LAB — TIQ-MISC

## 2019-10-15 LAB — RPR: RPR Ser Ql: NONREACTIVE

## 2019-10-15 LAB — SEDIMENTATION RATE: Sed Rate: 2 mm/hr (ref 0–40)

## 2019-10-15 LAB — ANTI-HU, ANTI-RI, ANTI-YO IFA
Anti-Hu Ab: NEGATIVE
Anti-Ri Ab: NEGATIVE
Anti-Yo Ab: NEGATIVE

## 2019-10-15 LAB — VGCC ANTIBODY: VGCC Antibody: NEGATIVE

## 2019-10-15 LAB — N-METHYL-D-ASPARTATE RECPT.IGG: N-methyl-D-Aspartate Recpt.IgG: <1:10 {titer}

## 2019-10-15 LAB — GLUTAMIC ACID DECARBOXYLASE AUTO ABS: Glutamic Acid Decarb Ab: <5 U/mL (ref 0.0–5.0)

## 2019-10-15 LAB — THYROGLOBULIN ANTIBODY: Thyroglobulin Antibody: <1 IU/mL (ref 0.0–0.9)

## 2019-10-15 LAB — ANA W/REFLEX: Anti Nuclear Antibody (ANA): NEGATIVE

## 2019-10-15 LAB — THYROID PEROXIDASE ANTIBODY: Thyroperoxidase Ab SerPl-aCnc: <8 IU/mL (ref 0–34)

## 2019-10-15 NOTE — Discharge Instructions (Signed)

## 2019-10-15 NOTE — Progress Notes (Signed)
Blood obtained from pt's L hand for LP labs by Maryruth Hancock, RN. Pt tolerated procedure well. Site is unremarkable.

## 2019-10-16 LAB — CYTOLOGY - NON PAP

## 2019-10-19 ENCOUNTER — Telehealth: Payer: Self-pay | Admitting: Neurology

## 2019-10-19 NOTE — Telephone Encounter (Signed)
Pt's husband, Alanis Clift (on DPR) Pt is confused, constantly saying things, no longer able to bring her out of it or wake her up. Mr. Wilhoite said, can no longer keep her at home. Ask if Dr. Jannifer Franklin can have her admitted to [facility]. Would like a call back from the nurse or Dr. Jannifer Franklin.

## 2019-10-19 NOTE — Telephone Encounter (Signed)
I called the husband.  The patient continues to progress very rapidly.  She is at this point a total care patient.  She is no longer sleeping, she is mumbling throughout the night, she is still taking in some food and fluids but the husband is concerned that she is not protecting her airway anymore.  I have indicated that getting her to the hospital is reasonable at this point.  I fear that this dementing illness will likely culminate in her death.  So far, all blood work is normal, spinal fluid analysis is still pending.  If the patient does go to the hospital, a repeat EEG study may be helpful, I will get a 24-hour urine study for heavy metals and for porphobilinogen and delta ALA.  Clearly, the neurologic process taking place is extremely rapidly progressive in this patient.  JCD is the most common cause of rapid progressive dementias, but so far we have not been able to find any typical MRI findings that would support this diagnosis.  I suppose that a PET scan may be of some help to identify any underlying source of neoplasm that could be associated with a paraneoplastic illness.

## 2019-10-20 ENCOUNTER — Inpatient Hospital Stay (HOSPITAL_COMMUNITY)
Admission: EM | Admit: 2019-10-20 | Discharge: 2019-11-09 | DRG: 071 | Disposition: A | Discharge status: Skilled Nursing Facility | Payer: Medicare Other | Attending: Internal Medicine | Admitting: Internal Medicine

## 2019-10-20 ENCOUNTER — Encounter (HOSPITAL_COMMUNITY): Payer: Self-pay | Admitting: *Deleted

## 2019-10-20 ENCOUNTER — Observation Stay (HOSPITAL_COMMUNITY): Payer: Medicare Other

## 2019-10-20 ENCOUNTER — Other Ambulatory Visit: Payer: Self-pay

## 2019-10-20 DIAGNOSIS — R262 Difficulty in walking, not elsewhere classified: Secondary | ICD-10-CM | POA: Diagnosis present

## 2019-10-20 DIAGNOSIS — G9341 Metabolic encephalopathy: Secondary | ICD-10-CM | POA: Diagnosis present

## 2019-10-20 DIAGNOSIS — E46 Unspecified protein-calorie malnutrition: Secondary | ICD-10-CM | POA: Diagnosis not present

## 2019-10-20 DIAGNOSIS — Z820 Family history of epilepsy and other diseases of the nervous system: Secondary | ICD-10-CM | POA: Diagnosis not present

## 2019-10-20 DIAGNOSIS — Z87440 Personal history of urinary (tract) infections: Secondary | ICD-10-CM

## 2019-10-20 DIAGNOSIS — J984 Other disorders of lung: Secondary | ICD-10-CM | POA: Diagnosis not present

## 2019-10-20 DIAGNOSIS — R000 Tachycardia, unspecified: Secondary | ICD-10-CM | POA: Diagnosis not present

## 2019-10-20 DIAGNOSIS — E0590 Thyrotoxicosis, unspecified without thyrotoxic crisis or storm: Secondary | ICD-10-CM | POA: Diagnosis not present

## 2019-10-20 DIAGNOSIS — R7401 Elevation of levels of liver transaminase levels: Secondary | ICD-10-CM | POA: Diagnosis not present

## 2019-10-20 DIAGNOSIS — Z825 Family history of asthma and other chronic lower respiratory diseases: Secondary | ICD-10-CM

## 2019-10-20 DIAGNOSIS — R627 Adult failure to thrive: Secondary | ICD-10-CM | POA: Diagnosis not present

## 2019-10-20 DIAGNOSIS — G9340 Encephalopathy, unspecified: Secondary | ICD-10-CM | POA: Diagnosis not present

## 2019-10-20 DIAGNOSIS — R1310 Dysphagia, unspecified: Secondary | ICD-10-CM | POA: Diagnosis not present

## 2019-10-20 DIAGNOSIS — F410 Panic disorder [episodic paroxysmal anxiety] without agoraphobia: Secondary | ICD-10-CM | POA: Diagnosis present

## 2019-10-20 DIAGNOSIS — R32 Unspecified urinary incontinence: Secondary | ICD-10-CM | POA: Diagnosis present

## 2019-10-20 DIAGNOSIS — J9811 Atelectasis: Secondary | ICD-10-CM | POA: Diagnosis not present

## 2019-10-20 DIAGNOSIS — E559 Vitamin D deficiency, unspecified: Secondary | ICD-10-CM | POA: Diagnosis present

## 2019-10-20 DIAGNOSIS — E8809 Other disorders of plasma-protein metabolism, not elsewhere classified: Secondary | ICD-10-CM | POA: Diagnosis not present

## 2019-10-20 DIAGNOSIS — A8100 Creutzfeldt-Jakob disease, unspecified: Secondary | ICD-10-CM | POA: Diagnosis not present

## 2019-10-20 DIAGNOSIS — K429 Umbilical hernia without obstruction or gangrene: Secondary | ICD-10-CM | POA: Diagnosis not present

## 2019-10-20 DIAGNOSIS — K219 Gastro-esophageal reflux disease without esophagitis: Secondary | ICD-10-CM | POA: Diagnosis not present

## 2019-10-20 DIAGNOSIS — Z20822 Contact with and (suspected) exposure to covid-19: Secondary | ICD-10-CM | POA: Diagnosis present

## 2019-10-20 DIAGNOSIS — F064 Anxiety disorder due to known physiological condition: Secondary | ICD-10-CM | POA: Diagnosis not present

## 2019-10-20 DIAGNOSIS — F411 Generalized anxiety disorder: Secondary | ICD-10-CM | POA: Diagnosis not present

## 2019-10-20 DIAGNOSIS — F0390 Unspecified dementia without behavioral disturbance: Secondary | ICD-10-CM | POA: Diagnosis present

## 2019-10-20 DIAGNOSIS — F419 Anxiety disorder, unspecified: Secondary | ICD-10-CM | POA: Diagnosis present

## 2019-10-20 DIAGNOSIS — I878 Other specified disorders of veins: Secondary | ICD-10-CM | POA: Diagnosis not present

## 2019-10-20 DIAGNOSIS — Z8719 Personal history of other diseases of the digestive system: Secondary | ICD-10-CM | POA: Diagnosis not present

## 2019-10-20 DIAGNOSIS — E876 Hypokalemia: Secondary | ICD-10-CM | POA: Diagnosis present

## 2019-10-20 DIAGNOSIS — I700 Atherosclerosis of aorta: Secondary | ICD-10-CM | POA: Diagnosis not present

## 2019-10-20 DIAGNOSIS — Z79899 Other long term (current) drug therapy: Secondary | ICD-10-CM

## 2019-10-20 DIAGNOSIS — D72829 Elevated white blood cell count, unspecified: Secondary | ICD-10-CM | POA: Diagnosis not present

## 2019-10-20 DIAGNOSIS — F447 Conversion disorder with mixed symptom presentation: Secondary | ICD-10-CM | POA: Diagnosis not present

## 2019-10-20 DIAGNOSIS — R4182 Altered mental status, unspecified: Secondary | ICD-10-CM

## 2019-10-20 LAB — URINALYSIS, ROUTINE W REFLEX MICROSCOPIC
Bilirubin Urine: NEGATIVE
Glucose, UA: NEGATIVE mg/dL
Hgb urine dipstick: NEGATIVE
Ketones, ur: NEGATIVE mg/dL
Leukocytes,Ua: NEGATIVE
Nitrite: NEGATIVE
Protein, ur: NEGATIVE mg/dL
Specific Gravity, Urine: 1.012 (ref 1.005–1.030)
pH: 6 (ref 5.0–8.0)

## 2019-10-20 LAB — CBC
HCT: 44.1 % (ref 36.0–46.0)
Hemoglobin: 14.7 g/dL (ref 12.0–15.0)
MCH: 31.5 pg (ref 26.0–34.0)
MCHC: 33.3 g/dL (ref 30.0–36.0)
MCV: 94.4 fL (ref 80.0–100.0)
Platelets: 289 10*3/uL (ref 150–400)
RBC: 4.67 MIL/uL (ref 3.87–5.11)
RDW: 14.2 % (ref 11.5–15.5)
WBC: 8.7 10*3/uL (ref 4.0–10.5)
nRBC: 0 % (ref 0.0–0.2)

## 2019-10-20 LAB — COMPREHENSIVE METABOLIC PANEL
ALT: 50 U/L — ABNORMAL HIGH (ref 0–44)
AST: 49 U/L — ABNORMAL HIGH (ref 15–41)
Albumin: 4 g/dL (ref 3.5–5.0)
Alkaline Phosphatase: 59 U/L (ref 38–126)
Anion gap: 10 (ref 5–15)
BUN: 9 mg/dL (ref 8–23)
CO2: 22 mmol/L (ref 22–32)
Calcium: 9.9 mg/dL (ref 8.9–10.3)
Chloride: 108 mmol/L (ref 98–111)
Creatinine, Ser: 0.85 mg/dL (ref 0.44–1.00)
GFR calc Af Amer: >60 mL/min (ref >60)
GFR calc non Af Amer: >60 mL/min (ref >60)
Glucose, Bld: 102 mg/dL — ABNORMAL HIGH (ref 70–99)
Potassium: 3.4 mmol/L — ABNORMAL LOW (ref 3.5–5.1)
Sodium: 140 mmol/L (ref 135–145)
Total Bilirubin: 1.1 mg/dL (ref 0.3–1.2)
Total Protein: 6.4 g/dL — ABNORMAL LOW (ref 6.5–8.1)

## 2019-10-20 LAB — CBG MONITORING, ED
Glucose-Capillary: 110 mg/dL — ABNORMAL HIGH (ref 70–99)
Glucose-Capillary: 108 mg/dL — ABNORMAL HIGH (ref 70–99)

## 2019-10-20 LAB — TSH: TSH: 0.851 u[IU]/mL (ref 0.350–4.500)

## 2019-10-20 LAB — SARS CORONAVIRUS 2 BY RT PCR (HOSPITAL ORDER, PERFORMED IN ~~LOC~~ HOSPITAL LAB): SARS Coronavirus 2: NEGATIVE

## 2019-10-20 LAB — T4, FREE: Free T4: 1.96 ng/dL — ABNORMAL HIGH (ref 0.61–1.12)

## 2019-10-20 MED ORDER — ALBUTEROL SULFATE (2.5 MG/3ML) 0.083% IN NEBU: 2.5000 mg | INHALATION_SOLUTION | Freq: Four times a day (QID) | RESPIRATORY_TRACT | Status: DC | PRN

## 2019-10-20 MED ORDER — BISACODYL 5 MG PO TBEC: 10.0000 mg | DELAYED_RELEASE_TABLET | Freq: Every day | ORAL | Status: DC | PRN

## 2019-10-20 MED ORDER — DIAZEPAM 5 MG PO TABS
2.5000 mg | ORAL_TABLET | Freq: Two times a day (BID) | ORAL | Status: DC | PRN
  Administered 2019-10-21: 2.5 mg via ORAL
  Filled 2019-10-20: qty 1

## 2019-10-20 MED ORDER — ACETAMINOPHEN 325 MG PO TABS: 650.0000 mg | ORAL_TABLET | Freq: Four times a day (QID) | ORAL | Status: DC | PRN

## 2019-10-20 MED ORDER — METHIMAZOLE 5 MG PO TABS
5.0000 mg | ORAL_TABLET | Freq: Three times a day (TID) | ORAL | Status: DC
  Filled 2019-10-20: qty 1

## 2019-10-20 MED ORDER — SODIUM CHLORIDE 0.9% FLUSH
3.0000 mL | Freq: Two times a day (BID) | INTRAVENOUS | Status: DC
  Administered 2019-10-21 – 2019-11-09 (×37): 3 mL via INTRAVENOUS

## 2019-10-20 MED ORDER — SODIUM CHLORIDE 0.9 % IV SOLN
1000.0000 mg | Freq: Every day | INTRAVENOUS | Status: DC
  Administered 2019-10-20 – 2019-10-24 (×5): 1000 mg via INTRAVENOUS
  Filled 2019-10-20 (×7): qty 8

## 2019-10-20 MED ORDER — SERTRALINE HCL 50 MG PO TABS
50.0000 mg | ORAL_TABLET | Freq: Every day | ORAL | Status: DC
  Administered 2019-10-20 – 2019-11-09 (×21): 50 mg via ORAL
  Filled 2019-10-20 (×22): qty 1

## 2019-10-20 MED ORDER — METHIMAZOLE 10 MG PO TABS: 10.0000 mg | ORAL_TABLET | Freq: Three times a day (TID) | ORAL | Status: DC

## 2019-10-20 MED ORDER — ONDANSETRON HCL 4 MG PO TABS: 4.0000 mg | ORAL_TABLET | Freq: Four times a day (QID) | ORAL | Status: DC | PRN

## 2019-10-20 MED ORDER — ENOXAPARIN SODIUM 40 MG/0.4ML ~~LOC~~ SOLN
40.0000 mg | SUBCUTANEOUS | Status: DC
  Administered 2019-10-20 – 2019-11-08 (×20): 40 mg via SUBCUTANEOUS
  Filled 2019-10-20 (×20): qty 0.4

## 2019-10-20 MED ORDER — ACETAMINOPHEN 650 MG RE SUPP: 650.0000 mg | Freq: Four times a day (QID) | RECTAL | Status: DC | PRN

## 2019-10-20 MED ORDER — PANTOPRAZOLE SODIUM 40 MG PO TBEC
40.0000 mg | DELAYED_RELEASE_TABLET | Freq: Every day | ORAL | Status: DC
  Administered 2019-10-20 – 2019-11-09 (×21): 40 mg via ORAL
  Filled 2019-10-20 (×21): qty 1

## 2019-10-20 MED ORDER — SODIUM CHLORIDE 0.9 % IV SOLN: Freq: Once | INTRAVENOUS | Status: AC

## 2019-10-20 MED ORDER — ONDANSETRON HCL 4 MG/2ML IJ SOLN
4.0000 mg | Freq: Four times a day (QID) | INTRAMUSCULAR | Status: DC | PRN
  Administered 2019-11-02: 4 mg via INTRAVENOUS
  Filled 2019-10-20: qty 2

## 2019-10-20 MED ORDER — PROPRANOLOL HCL ER 60 MG PO CP24
60.0000 mg | ORAL_CAPSULE | Freq: Every day | ORAL | Status: DC
  Administered 2019-10-20 – 2019-11-09 (×21): 60 mg via ORAL
  Filled 2019-10-20 (×21): qty 1

## 2019-10-20 MED ORDER — METHIMAZOLE 5 MG PO TABS
5.0000 mg | ORAL_TABLET | Freq: Three times a day (TID) | ORAL | Status: DC
  Administered 2019-10-20 – 2019-11-09 (×60): 5 mg via ORAL
  Filled 2019-10-20 (×64): qty 1

## 2019-10-20 MED ORDER — POTASSIUM CHLORIDE CRYS ER 20 MEQ PO TBCR
20.0000 meq | EXTENDED_RELEASE_TABLET | ORAL | Status: AC
  Filled 2019-10-20: qty 1

## 2019-10-20 NOTE — H&P (Signed)
History and Physical    [name]:[id number] DOB: 05/17/1950 DOA: 10/20/2019  Referring MD/NP/PA: Davonna Belling, MD PCP: Carlena Hurl, PA-C  Consultants: Neurology-Dr. Jannifer Franklin Patient coming from: Home  Chief Complaint: Progressive decline  I have personally briefly reviewed patient's old medical records in Hannah Padilla   HPI: Hannah Padilla is a 69 y.o. female with medical history significant of subclinical hyperthyroidism, RMSF, and UTIs presents with reports of progressive decline over the last 3 months.  Prior to onset of symptoms patient was healthy and able to complete all of her ADLs without need of assistance.  History is obtained mostly from her son who is present at bedside.  Due to her symptoms he had to come and start caring for her at home.  Her primary care provider had diagnosed her with hypothyroidism and placed her on methimazole 5 mg 3 times daily which she has been taking as prescribed as well as propranolol, but had not yet been able to get her a appointment with the endocrinologist yet.Reports that she has been having panic attacks, tremor, intermittent sweats, generalized weakness, falls, difficulty swallowing, hallucinations(talking to people that are deceased), insomnia, incontinence of urine/bowel.  Patient has had extensive work-up including lumbar puncture, MRI, pheochromocytoma work-up, and [place] spotted fever which patient was noted to be positive for.  She is currently being followed in outpatient setting by Dr. Jannifer Franklin of neurology.  Tried placing her on medications like Valium which has helped somewhat panic attacks.  They attempted to put her on Seroquel due to her insomnia, but son reports that she became catatonic and this was discontinued after 2 doses.    ED Course: Upon admission into the emergency department patient was noted to be afebrile, pulse 68-82, respirations 10-27, and all other vitals stable.  Labs significant for potassium 3.4.   Neurology have been consulted.  [facility] called to admit.  Review of Systems  Unable to perform ROS: Mental status change  Constitutional: Positive for diaphoresis and malaise/fatigue. Negative for fever.  Eyes: Negative for photophobia.  Genitourinary:       Positive for incontinence  Musculoskeletal: Positive for falls.  Neurological: Positive for tremors.  Psychiatric/Behavioral: Positive for hallucinations. The patient is nervous/anxious and has insomnia.     Past Medical History:  Diagnosis Date  . Colon polyp 2015  . Elevated serum free T4 level    husband report  . Farsightedness    wears glasses  . Mood change 2021   major change in affect, health issues with multiple ED visits, hospitalization, starting 07/10/2019  . RMSF [facility] spotted fever) 2021  . Urinary tract infection     Past Surgical History:  Procedure Laterality Date  . BREAST REDUCTION SURGERY    . CARPAL TUNNEL RELEASE     right  . COLONOSCOPY  8119   [place],   . lichenoid keratosis  09/05/11   Skin, left anterior shouldee  . PARTIAL HYSTERECTOMY     still has ovaries  . WISDOM TOOTH EXTRACTION       reports that she has never smoked. She has never used smokeless tobacco. She reports previous alcohol use of about 5.0 standard drinks of alcohol per week. She reports that she does not use drugs.  No Known Allergies  Family History  Problem Relation Age of Onset  . Alzheimer's disease Mother   . Other Father        cognitive decline  . COPD Father  smoker  . Protein S deficiency Other   . Heart disease Neg Hx   . Diabetes Neg Hx   . Cancer Neg Hx   . Stroke Neg Hx   . Hypertension Neg Hx   . Hyperlipidemia Neg Hx     Prior to Admission medications   Medication Sig Start Date End Date Taking? Authorizing Provider  acetaminophen (TYLENOL) 325 MG tablet Take 2 tablets (650 mg total) by mouth every 6 (six) hours as needed for mild pain (or Fever >/= 101). 08/22/19  Yes Elgergawy,  Silver Huguenin, MD  bisacodyl (DULCOLAX) 5 MG EC tablet Take 10 mg by mouth daily as needed for moderate constipation.   Yes [provider]  diazepam (VALIUM) 5 MG tablet Take 1 tablet (5 mg total) by mouth every 12 (twelve) hours as needed for anxiety. Patient taking differently: Take 2.5 mg by mouth every 12 (twelve) hours as needed for anxiety.  09/04/19 09/03/20 Yes Tysinger, Camelia Eng, PA-C  megestrol (MEGACE) 40 MG/ML suspension Take 5 mLs (200 mg total) by mouth daily. Patient taking differently: Take 400 mg by mouth daily.  09/10/19 09/09/20 Yes Tysinger, Camelia Eng, PA-C  methimazole (TAPAZOLE) 5 MG tablet Take 1 tablet (5 mg total) by mouth 3 (three) times daily. 10/06/19  Yes Tysinger, Camelia Eng, PA-C  omeprazole (PRILOSEC) 40 MG capsule Take 1 capsule by mouth once daily Patient taking differently: Take 40 mg by mouth daily.  11/12/18  Yes Tysinger, Camelia Eng, PA-C  ondansetron (ZOFRAN-ODT) 8 MG disintegrating tablet Take 1 tablet (8 mg total) by mouth every 8 (eight) hours as needed. Patient taking differently: Take 8 mg by mouth every 8 (eight) hours as needed for nausea or vomiting.  09/10/19  Yes Tysinger, Camelia Eng, PA-C  propranolol ER (INDERAL LA) 60 MG 24 hr capsule Take 1 capsule (60 mg total) by mouth daily. 10/06/19 10/05/20 Yes Tysinger, Camelia Eng, PA-C  sertraline (ZOLOFT) 50 MG tablet Take 50 mg by mouth daily. 09/24/19  Yes [provider]  Vitamin D, Ergocalciferol, (DRISDOL) 1.25 MG (50000 UNIT) CAPS capsule Take 1 capsule (50,000 Units total) by mouth every 7 (seven) days. Patient taking differently: Take 50,000 Units by mouth every Wednesday.  07/22/19  Yes Tysinger, Camelia Eng, PA-C    Physical Exam:  Constitutional: Elderly female who appears to be subdued Vitals:   10/20/19 1015 10/20/19 1030 10/20/19 1045 10/20/19 1100  BP: 112/66 96/66 99/69  108/72  Pulse: 72 70 76 79  Resp: 16 18 10 13   Temp:      TempSrc:      SpO2: 99% 98% 100% 100%   Eyes: PERRL, lids and  conjunctivae normal ENMT: Mucous membranes are dry. Posterior pharynx clear of any exudate or lesions.  Neck: normal, supple, no masses, no thyromegaly Respiratory: clear to auscultation bilaterally, no wheezing, no crackles. Normal respiratory effort. No accessory muscle use.  Cardiovascular: Regular rate and rhythm, no murmurs / rubs / gallops. No extremity edema. 2+ pedal pulses. No carotid bruits.  Abdomen: no tenderness, no masses palpated. No hepatosplenomegaly. Bowel sounds positive.  Musculoskeletal: no clubbing / cyanosis. No joint deformity upper and lower extremities. Good ROM, no contractures. Normal muscle tone.  Skin: Poor skin turgor. Neurologic: CN 2-12 grossly intact.  Tremor present Psychiatric: Anxious mood.   Labs on Admission: I have personally reviewed following labs and imaging studies  CBC: Recent Labs  Lab 10/20/19 0553  WBC 8.7  HGB 14.7  HCT 44.1  MCV 94.4  PLT 289  Basic Metabolic Panel: Recent Labs  Lab 10/20/19 0553  NA 140  K 3.4*  CL 108  CO2 22  GLUCOSE 102*  BUN 9  CREATININE 0.85  CALCIUM 9.9   GFR: Estimated Creatinine Clearance: 61.6 mL/min (by C-G formula based on SCr of 0.85 mg/dL). Liver Function Tests: Recent Labs  Lab 10/20/19 0553  AST 49*  ALT 50*  ALKPHOS 59  BILITOT 1.1  PROT 6.4*  ALBUMIN 4.0   No results for input(s): LIPASE, AMYLASE in the last 168 hours. No results for input(s): AMMONIA in the last 168 hours. Coagulation Profile: No results for input(s): INR, PROTIME in the last 168 hours. Cardiac Enzymes: No results for input(s): CKTOTAL, CKMB, CKMBINDEX, TROPONINI in the last 168 hours. BNP (last 3 results) No results for input(s): PROBNP in the last 8760 hours. HbA1C: No results for input(s): HGBA1C in the last 72 hours. CBG: Recent Labs  Lab 10/20/19 0528 10/20/19 0854  GLUCAP 110* 108*   Lipid Profile: No results for input(s): CHOL, HDL, LDLCALC, TRIG, CHOLHDL, LDLDIRECT in the last 72  hours. Thyroid Function Tests: No results for input(s): TSH, T4TOTAL, FREET4, T3FREE, THYROIDAB in the last 72 hours. Anemia Panel: No results for input(s): VITAMINB12, FOLATE, FERRITIN, TIBC, IRON, RETICCTPCT in the last 72 hours. Urine analysis:    Component Value Date/Time   COLORURINE YELLOW 08/19/2019 0445   APPEARANCEUR CLOUDY (A) 08/19/2019 0445   LABSPEC 1.015 10/05/2019 0929   PHURINE 6.0 08/19/2019 0445   GLUCOSEU NEGATIVE 08/19/2019 0445   HGBUR SMALL (A) 08/19/2019 0445   BILIRUBINUR negative 10/05/2019 0929   BILIRUBINUR NEG 11/26/2011 0931   KETONESUR negative 10/05/2019 0929   KETONESUR NEGATIVE 08/19/2019 0445   PROTEINUR negative 10/05/2019 0929   PROTEINUR NEGATIVE 08/19/2019 0445   UROBILINOGEN negative 11/26/2011 0931   UROBILINOGEN 0.2 03/25/2011 0633   NITRITE Negative 10/05/2019 0929   NITRITE NEGATIVE 08/19/2019 0445   LEUKOCYTESUR Negative 10/05/2019 0929   LEUKOCYTESUR LARGE (A) 08/19/2019 0445   Sepsis Labs: Recent Results (from the past 240 hour(s))  TIQ-MISC     Status: None   Collection Time: 10/15/19  8:54 AM  Result Value Ref Range Status   QUESTION/PROBLEM:   Final    Comment: . There is a question regarding the following specimen submitted and/or the test requested. .    QUESTION: VERIFY TEST 14-3-3  Final    Comment: REQUESTED INFORMATION _________________________________ . AUTHORIZED SIGNATURE __________________________________ . TO PREVENT FURTHER DELAYS IN TESTING, PLEASE COMPLETE INFORMATION ABOVE AND EITHER FAX TO [phone] OR  EMAIL TO [email or web address] TO  RESOLVE THIS ORDER.      Radiological Exams on Admission: No results found.  EKG: Independently reviewed.  Normal sinus rhythm at 80 bpm.  Assessment/Plan Metabolic encephalopathy: Acute on chronic.  Patient presents with altered mental status, but appears symptoms have been progressively worsening since June.  Noted to have tremor, anxiety, panic  attacks, insomnia, diaphoresis, weakness, falls, and hallucinations.  Patient has had extensive work-up for evaluation for this in the past.  Urinalysis showed no signs of UTI.  Neurology concern for the possibility of Creutzfeldt Jacob's disease -Admit to a medical telemetry bed -Neurochecks  -PT/OT/speech consults -Transitions of care consult -Start high-dose per neurology -[facility] neurology consultative services, we will follow-up for further recommendations  Subclinical hyperthyroidism: Appears uncontrolled.  Review of records shows patient's free T4 has been trending up from 1.86-> 1.95-> 2.33 which could be correlating with her decline.  Home regimen included Tapazole 5 mg 3 times daily.  Case was discussed with Dr. Delrae Rend of endocrinology on [facility].  He question the possibility of pituitary adenoma(had 2 neg MRIs of the brain) vs hormone resistance issue vs possible antibody issue.  Question if patient's acid reflux medication as affecting its absorption. -Check total T3, total T4, free T4, and TSH   -Please consider discussing with Dr. Buddy Duty of endocrinology for further recommendations.  He notes that he is willing to see the patient in outpatient setting if referral is sent  Dysphagia -Aspiration precaution -Speech therapy consult  Hypokalemia: Acute.  Potassium just mildly low at 3.4. -Give 20 mEq of potassium chloride  Anxiety -Continue Valium as needed  Transaminitis: Acute.  AST 49 and ALT 50.  Unclear cause of symptoms -Recheck liver function studies  GERD: Home medications include omeprazole. -Pharmacy substitution of Protonix  DVT prophylaxis: Lovenox Code Status: Full Family Communication: Plan of care discussed with the patient's at bedside Disposition Plan: To be determined Consults called: Neurology Admission status: Inpatient  Norval Morton MD [facility] Pager [phone]   If 7PM-7AM, please contact  night-coverage [email or web address] Password [email or web address]  10/20/2019, 11:13 AM

## 2019-10-20 NOTE — Progress Notes (Signed)
[facility] consulted to obtain IV access with blood draw. Pt currently has no IV meds/fluids or studies ordered. Spoke with Deidre Ala, pt's nurse; educated it is best practice not to place an IV that is not currently needed to decrease infection risk and allow for vein preservation. Deidre Ala communicated that patient will eventually be admitted and get IVF's for loose stools. Advised that when patient has orders for IV fluids or meds, VAST can respond and place IV.

## 2019-10-20 NOTE — ED Notes (Signed)
EEG technician by bedside.

## 2019-10-20 NOTE — ED Notes (Signed)
RN attempted x2 for an IV on pt and was not successful

## 2019-10-20 NOTE — Progress Notes (Signed)
EEG complete - results pending 

## 2019-10-20 NOTE — Progress Notes (Signed)
Pt was seen with husband to offer information, and was also visited by neurologist during session.  Pt is shuffling with gait, with a startle like response to movement at times, requiring physical and verbal cues for any movement to complete any transition.  Pt is not offering to move much without PT initiating mobility, esp to get onto and off table.  Pt is not following traditional mm testing and demonstrates less than 3/5 movement on LE's voluntarily.  Unsafe to stand without direct assist, but per husband is looking more like her previous level before this decline over the last several weeks.  Will recommend her to continue PT and be considered for SNF rehab since she is now unable to walk safely, cannot transfer reliably and is unable to stand safely and consistently.  Gait has a Parkinson's quality of shuffling and improves with more time standing and sidestepping to lengthen steps.    10/20/19 1600  PT Visit Information  Last PT Received On 10/20/19  Assistance Needed +1 (2 for attempt to walk away from side of bed)  History of Present Illness 69 yo female with onset of Parkinson's like symptoms was admitted to ED, with recurrent changes of metabolic encephalopathy, AMS, panic attacks, and tremors. Pt is rapidly declining in her mobility to approach dependent care.  Neurologists are working to diagnose her condition. PMHx:  [place] spotted fever, UTI, dementia, incontinence, colon polyp, mood changes, hyperthyroidism, anxiety,   Precautions  Precautions Fall  Precaution Comments fine tremors in all extremites  Restrictions  Weight Bearing Restrictions No  Other Position/Activity Restrictions Pt is unable to stand up without flexing and supporting R knee to avoid her extending quickly  Home Living  Family/patient expects to be discharged to: Private residence  Living Arrangements Spouse/significant other  Available Help at Discharge Family;Available 24 hours/day  Type of [place] to enter  Home Layout One level  Home Equipment [facility]  Prior Function  Level of [place] Needs assistance  Gait / Transfers Assistance Needed recently has needed help to walk and transfer, gait for very short trips of a few steps  ADL's / [place] husband cares for home, assists wife to dress and bathe  Communication / Swallowing Assistance Needed reduced verbalizations  Comments per husband has times wiht blank stairing  Communication  Communication Expressive difficulties;Receptive difficulties  Pain Assessment  Pain Assessment No/denies pain  Cognition  Arousal/Alertness Lethargic  Behavior During Therapy Flat affect  Overall Cognitive Status Impaired/Different from baseline  Area of Impairment Problem solving;Awareness;Safety/judgement;Following commands;Memory;Attention;Orientation  Orientation Level Situation;Time;Place  Current Attention Level Selective  Memory Decreased short-term memory  Following Commands Follows one step commands inconsistently;Follows one step commands with increased time  Safety/Judgement Decreased awareness of safety;Decreased awareness of deficits  Awareness Intellectual  Problem Solving Slow processing;Decreased initiation;Difficulty sequencing;Requires verbal cues;Requires tactile cues  General Comments requires set up with verbal instructions to get up and side step  Upper Extremity Assessment  Upper Extremity Assessment Generalized weakness (fine tremors in extremities)  Lower Extremity Assessment  Lower Extremity Assessment Generalized weakness (fine tremors in extremities)  Cervical / Trunk Assessment  Cervical / Trunk Assessment Kyphotic  Bed Mobility  Overal bed mobility Needs Assistance  Bed Mobility Supine to Sit;Sit to Supine  Supine to sit Mod assist  Sit to supine Mod assist  General bed mobility comments mod assist to support both trunk and legs to get onto bed  Transfers  Overall  transfer level Needs assistance  Equipment used 1 person hand held assist  Transfers Sit to/from Stand  Sit to [employer] assist;Mod assist  General transfer comment variable effort and better with practice  Ambulation/Gait  Ambulation/Gait assistance Min assist  Gait Distance (Feet) 12 Feet (4 x 3')  Assistive device 1 person hand held assist  Gait Pattern/deviations Step-to pattern;Decreased stride length;Wide base of support  General Gait Details shuffling and sidestepping gait, sudden startle like stop but does not startle with loud noises  Gait velocity reduced  Gait velocity interpretation <1.8 ft/sec, indicate of risk for recurrent falls  Balance  Overall balance assessment Needs assistance;History of Falls  Sitting-balance support Feet supported  Sitting balance-Leahy Scale Fair  Standing balance support Bilateral upper extremity supported;During functional activity  Standing balance-Leahy Scale Poor  Standing balance comment sidesteps with control to shuffle sideways, then loses balance to R side minimally  General Comments  General comments (skin integrity, edema, etc.) Pt has significant restrictions of ROM of legs along with tone in ankles, clonus, R>L.  Nerologist is in to assess but not clear about his opinion yet  Exercises  Exercises Other exercises (Pt is demonstrating less than 3/5 LE strength, no mm tests )  Other Exercises  Other Exercises pt not following mm testing  PT - End of Session  Activity Tolerance Patient limited by fatigue;Other (comment) (anxious per her report)  Patient left in bed;with call bell/phone within reach;with family/visitor present  Nurse Communication Mobility status  PT Assessment  PT Recommendation/Assessment Patient needs continued PT services  PT Visit Diagnosis Unsteadiness on feet (R26.81);Muscle weakness (generalized) (M62.81);History of falling (Z91.81);Other abnormalities of gait and mobility (R26.89);Other symptoms and signs  involving the nervous system (R29.898)  PT Problem List Decreased strength;Decreased range of motion;Decreased activity tolerance;Decreased balance;Decreased mobility;Decreased coordination;Decreased cognition;Decreased knowledge of use of DME;Decreased safety awareness  Barriers to Discharge Inaccessible home environment  Barriers to Discharge Comments has stairs to enter house  PT Plan  PT Frequency (ACUTE ONLY) Min 2X/week  PT Treatment/Interventions (ACUTE ONLY) DME instruction;Gait training;Stair training;Functional mobility training;Therapeutic activities;Therapeutic exercise;Balance training;Neuromuscular re-education;Patient/family education  AM-PAC PT "6 Clicks" Mobility Outcome Measure (Version 2)  Help needed turning from your back to your side while in a flat bed without using bedrails? 3  Help needed moving from lying on your back to sitting on the side of a flat bed without using bedrails? 2  Help needed moving to and from a bed to a chair (including a wheelchair)? 2  Help needed standing up from a chair using your arms (e.g., wheelchair or bedside chair)? 3  Help needed to walk in hospital room? 3  Help needed climbing 3-5 steps with a railing?  1  6 Click Score 14  Consider Recommendation of Discharge To: CIR/SNF/LTACH  PT Recommendation  Follow Up Recommendations SNF  PT equipment None recommended by PT  Individuals Consulted  Consulted and Agree with Results and Recommendations Patient;Family member/caregiver  Family Member Consulted husband  Acute Rehab PT Goals  Patient Stated Goal none stated  PT Goal Formulation With family  Time For Goal Achievement 11/03/19  Potential to Achieve Goals Good  PT Time Calculation  PT Start Time (ACUTE ONLY) 1513  PT Stop Time (ACUTE ONLY) 1546  PT Time Calculation (min) (ACUTE ONLY) 33 min  PT General Charges  $$ ACUTE PT VISIT 1 Visit  PT Evaluation  $PT Eval Moderate Complexity 1 Mod  PT Treatments  $Gait Training 8-22 mins    Mee Hives, PT MS Acute Rehab Dept.  Number: [place] and [place]

## 2019-10-20 NOTE — Consult Note (Signed)
NEURO HOSPITALIST CONSULT NOTE   Requestig physician: Dr. Tamala Julian   Reason for Consult: Mental status changes    History obtained from:  Patient, Husband and Chart  HPI:                                                                                                                                          Hannah Padilla is an 69 y.o. female presenting today for continued mental status changes and rapidly progressing dementia. History of acute metabolic encephalopathy, recurrent UTIs, GERD, anxiety, panic attacks, hyperthyroidism.  07/10/19: Acute onset behavioral and cognitive changes. Workup positive for [facility] Spotted Fever and treated with doxycycline. She did have a tick on her left thigh weeks prior, but did not develop rash or neck stiffness. Lyme titers were negative.   08/11/19: Presented to the ED with anxiety/panic attacks and confusion. Diagnosed with encephalopathy secondary to UTI, with underlying anxiety. Brain MRI was negative. Treated with 3 days IV Rocephin with clinical improvement. Maintained on Xanax and hydroxine per psychiatry.    08/18/19: Presented after worsening confusion and increased panic attacks multiple times per day. Confusion examples included urinating in a trashcan and hat, and using hydrocortisone cream on her toothbrush. Family unable to leave her alone due to safety concerns. Neurology consult on this admission. At that time oriented with fluent speech and following 3 step commands, anxious affect noted. Only able to recall 1 of 3 objects after 3 minute delay. Not able to recite months of year backword or spell world backwards. 5/5 all extremities. Intermittent tremulous movement bilateral upper extremities. Reported to neurology she heard cult like gatherings over night and believed she had been robbed of her jewelry and cell phone. EEG normal. Neurology felt not likely a neurologic issue, possibility of pseudodementia with underlying  psychiatric disorder mentioned. She was treated for recurrent UTI with Penicillin G. Psychiatry was following and she was started on haloperidol 2mg  BID for hallucinations, seeing insects in room. Continued xanax for anxiety.   8/21: Following hospital discharge she was seen by the [facility] in [facility] and was concern for underlying neurologic progressive issue and advised neurology referral. In August began having urinary and fecal incontinence. She was off haldol and xanax, and sertraline was started. She was taking valium, but had been decreasing dosing. Concern raised by end of August with primary physician of possible Parkinson's due tremor, fecal and urinary incontinence, muscle rigidity, trouble swallowing, gait change, changes in affect and memory.   10/08/19: Seen by Dr. Jannifer Franklin at [facility]. MMSE 12/30, "developed a shuffling gait, tremor with the right hand, masking of the face, and significant problems with abulia. She will not initiate conversation or activities. She now requires assistance standing up, she requires assistance with  bathing and dressing and feeding. Within the last 2 weeks she has had incontinence of the bowels and the bladder". At that time it was felt she appeared to have a Parkinsonian Syndrome with severe abulia. Plan for evaluation of an autoimmune disorder or CJD. Plan for MRI and lumbar puncture. MRI was stable from 08/11/19, no cortical enhancement to suggest CJD.   10/19/19: Neurologist Dr. Jannifer Franklin spoke with husband yesterday over the phone and at that point she was total care, not sleeping, concern she is not protecting her airway. Neurology recommended hospitalization and said EEG may be helpful. A 24-hour urine study for heavy metals and for porphobilinogen and delta ALA. Neurology felt CJD is the most common cause of rapid progressive dementias, but they did not find typical MRI findings to support diagnosis. Suggestion that a PET scan may  be of some help to identify any underlying source of neoplasm that could be associated with a paraneoplastic illness.  Husband [name] is at bedside and is a extremely good historian of all events up to this point, he is a former paramedic. He reports she was completely well prior to June 4th. No recent family deaths or significant life changes. 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 Family history significant for a mother with dementia, however it was a very gradual decline over 10 years. Husband is worried about thyroid levels being addressed, but states that he feels neurologic disorder is most concerning. He was very worried about CJD although is now under the impression that is less likely. He is hopeful LP results will give some insight. He feels is unable to safely care for her at home at this point, she is confused, hallucinating, incontinent, unsteady and he is worried she is also aspirating.   Past Medical History:  Diagnosis Date  . Colon polyp 2015  . Elevated serum free T4 level    husband report  . Farsightedness    wears glasses  . Mood change 2021   major change in affect, health issues with multiple ED visits, hospitalization, starting 07/10/2019  . [facility] spotted fever) 2021  . Urinary tract infection     Past Surgical History:  Procedure Laterality Date  . BREAST REDUCTION SURGERY    . CARPAL TUNNEL RELEASE     right  . COLONOSCOPY  [address]   [place], [place]  . lichenoid keratosis  09/05/11   Skin, left anterior shouldee  . PARTIAL HYSTERECTOMY     still has ovaries  . WISDOM TOOTH EXTRACTION      Family History  Problem Relation Age of Onset  . Alzheimer's disease Mother   . Other Father        cognitive decline  . COPD Father        smoker  . Protein S deficiency Other   . Heart disease Neg Hx   . Diabetes Neg Hx   . Cancer Neg Hx   . Stroke Neg Hx   . Hypertension Neg Hx   . Hyperlipidemia  Neg Hx               Social History:  reports that she has never smoked. She has never used smokeless tobacco. She reports previous alcohol use of about 5.0 standard drinks of alcohol per week. She reports that she does not use drugs.  No Known Allergies  MEDICATIONS:  I have reviewed the patient's current medications.  ROS:                                                                                                                                       History obtained from spouse  General ROS: positive for fatigue, night sweats and weight loss -  Negative for chills and fever,  Psychological ROS: positive for behavioral disorder, hallucinations and memory difficulties. Negative for suicidal ideation Ophthalmic ROS: positive for blurry vision. Negative for double vision, eye pain or loss of vision ENT ROS: negative for - epistaxis, nasal discharge, oral lesions, sore throat, tinnitus or vertigo Allergy and Immunology ROS: negative for - hives or itchy/watery eyes Hematological and Lymphatic ROS: negative for - bleeding problems, bruising or swollen lymph nodes Endocrine ROS: negative for - galactorrhea, hair pattern changes, polydipsia/polyuria or temperature intolerance Respiratory ROS: positive for cough, husband concerned she is aspirating. negative for hemoptysis, shortness of breath or wheezing Cardiovascular ROS: negative for - chest pain, dyspnea on exertion, edema or irregular heartbeat Gastrointestinal ROS: positive for diarrhea last night, ongoing urinary and stool incontinence since August. Negative for - abdominal pain, hematemesis.  Genito-Urinary ROS: Positive for urinary incontinence. Negative for - dysuria, hematuria, urinary frequency/urgency Musculoskeletal ROS: negative for - joint swelling or muscular weakness Neurological ROS: as noted in  HPI Dermatological ROS: negative for rash and skin lesion changes   Blood pressure 101/77, pulse 81, temperature 98 F (36.7 C), temperature source Oral, resp. rate 15, SpO2 98 %.   General Examination:                                                                                                       Physical Exam  HEENT-  Normocephalic, no lesions, without obvious abnormality.  Normal external eye and conjunctiva.   Cardiovascular- S1-S2 audible, pulses palpable throughout   Lungs-no rhonchi or wheezing noted, no excessive working breathing.  Saturations within normal limits Abdomen- All 4 quadrants palpated and nontender Extremities- Warm, dry and intact Musculoskeletal-no joint tenderness, deformity or swelling Skin-warm and dry, no hyperpigmentation, vitiligo, or suspicious lesions  Neurological Examination Mental Status: Alert, oriented to place and year. States the year is March, her husband reports that lately she always says it is March. Short responses when asked any questions. No evidence of aphasia. Able to follow simple multi step commands, however is delayed in processing and reacting.  Cranial Nerves: II:  Visual fields grossly normal,  III,IV, VI: ptosis  not present, extra-ocular decreased horizontal gaze bilaterally. bilaterally pupils equal, round, reactive to light and accommodation V,VII: smile symmetric, facial light touch sensation normal bilaterally VIII: hearing normal bilaterally IX,X: uvula rises symmetrically XI: bilateral shoulder shrug XII: midline tongue extension with tongue tremor Motor: Right : Upper extremity   4/5    Left:     Upper extremity   4/5  Lower extremity   4/5     Lower extremity   4/5 Tone and bulk:normal tone throughout; no atrophy noted Sensory: light touch intact throughout, bilaterally Cerebellar: Resting tremor bilateral upper extremities R>L, increased with activity. Attempted finger to nose, however difficulty with tremor and  not accurately able to reach endpoints. Unable to perform heel-to-shin test, would not attempt, tremoring noted in lower extremities, less than upper extremities.  Lab Results: Basic Metabolic Panel: Recent Labs  Lab 10/20/19 0553  NA 140  K 3.4*  CL 108  CO2 22  GLUCOSE 102*  BUN 9  CREATININE 0.85  CALCIUM 9.9    CBC: Recent Labs  Lab 10/20/19 0553  WBC 8.7  HGB 14.7  HCT 44.1  MCV 94.4  PLT 289    Cardiac Enzymes: No results for input(s): CKTOTAL, CKMB, CKMBINDEX, TROPONINI in the last 168 hours.  Lipid Panel: No results for input(s): CHOL, TRIG, HDL, CHOLHDL, VLDL, LDLCALC in the last 168 hours.  Imaging: No results found.   Assessment: 68 Female with significant neurologic decline since 07/10/19. Prior to June she was well and active. 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 She was initially treated in early June for [facility] Spotted Fever in June. She presented with worsening anxiety/panic attacks 08/11/19 and was treated for UTI. Brain MRI was normal at that time. She returned 7/13/21with worsening confusion and panic attacks. She was having hallucinations and paranoia at that time as well. EEG was normal. Neurology felt at that time she likely had a pseudodementia with underlying psychiatric disorder. She was treated for a UTI. In August 2021 she had outpatient follow-up and during that time developed urinary/stool incontinence, shuffling gait, changes in affect and continued memory issues.   She was then seen 9/13 by Dr. Jannifer Franklin in outpatient neurology and he noted shuffling fait, tremor, masking of the face, abulia and MMSE 12/30. Concern for CJD versus autoimmune neurologic disorder. Repeat MRI was stable compared to June and did not show evidence of CJD. Lumbar puncture sent and results pending.   The patient presents today with her husband [name]. He is a very good historian of her complicated history up to this point. He feels  is no longer able to safely care for her at home, she is confused, hallucinating, incontinent, unsteady and he is worried she is also aspirating. He is aware of a possible thyroid issue, but is primarily concerned with a possible neurologic diagnosis for her rapid decline.     Impression: 1. Rapid Neurologic Decline in a previously well 69 year female, including memory difficulty, tremor, hallucinations, paranoia, gait changes, face masking, unsteadiness and urinary/stool incontinence.  CJD remains a strong possibility, another possibility is an autoimmune neurologic disorder.   Recommendations: 1. Rapid Neurologic Decline - Awaiting LP results completed by outpatient neurology, as they may help determine if this is actually CJD, which remains a concen. At this point Neurology is considering treatment with steroids for a possible autoimmune neurologic disorder as cause for her rapid decline. Dr. Leonel Ramsay is going to see the patient and discuss the  option of steroids with her husband.      Gwenyth Bouillon DNP, FNP-C [facility] Nurse Practitioner   Note review and further recommendations to follow from attending Neurologist.    10/20/2019, 10:49 AM

## 2019-10-20 NOTE — ED Provider Notes (Signed)
[facility] EMERGENCY DEPARTMENT Provider Note   CSN: [id number] Arrival date & time: 10/20/19  0515     History Chief Complaint  Patient presents with  . Altered Mental Status    Hannah Padilla is a 69 y.o. female. Level 5 caveat due to altered mental status. HPI Patient presents with altered mental status.  Has been having issues since June 4 when symptoms began abruptly.  Had some anxiety at that time.  However has had decreasing mental status since then.  Tremors.  Difficulty swallowing.  Confusion.  Has had 2 episodes of urinary tract infection.  Has had lumbar punctures and MRIs without a clear cause of the symptoms.  Reported also is positive for [facility] spotted fever.  Also some subclinical hyperthyroidism.  Has been seen by psychiatry and neurology.  Has been seeing Dr. Jannifer Franklin by neurology.  Decreased oral intake.  Difficulty standing.  No clear cause been seen despite the extensive work-up.  Thought that it was neurologic not psychologic per family however.  History comes primarily from patient's husband and chart review.    Past Medical History:  Diagnosis Date  . Colon polyp 2015  . Elevated serum free T4 level    husband report  . Farsightedness    wears glasses  . Mood change 2021   major change in affect, health issues with multiple ED visits, hospitalization, starting 07/10/2019  . RMS[facility] spotted fever) 2021  . Urinary tract infection     Patient Active Problem List   Diagnosis Date Noted  . Hallucination 10/05/2019  . Dehydration 10/05/2019  . Dysphagia 10/05/2019  . Incontinence of feces 09/29/2019  . Urinary incontinence 09/29/2019  . Adrenal mass ([place]) 09/02/2019  . Decrease in appetite 09/02/2019  . Weight loss 09/02/2019  . Acute bilateral thoracic back pain 09/02/2019  . Metabolic encephalopathy 40/98/1191  . Subclinical hyperthyroidism 08/19/2019  . Panic attack 08/18/2019  . Urinary tract infection without  hematuria 08/18/2019  . UTI (urinary tract infection) 08/12/2019  . Altered mental state 08/12/2019  . Grief 08/12/2019  . RMSF [facility] spotted fever) 07/27/2019  . Anxiety 07/21/2019  . Elevated lipase 07/21/2019  . Abnormal thyroid blood test 07/21/2019  . Sleep disturbance 07/21/2019  . Hypokalemia 07/21/2019  . Leukocytosis 07/21/2019  . Tremor 07/21/2019  . Vitamin D deficiency 07/21/2019  . Palpitation 07/21/2019  . Vaginal bleeding 07/08/2018  . Encounter for hepatitis C screening test for low risk patient 07/08/2018  . Chronic nausea 07/08/2018  . Screening for cervical cancer 07/08/2018  . Vaginal mass 07/08/2018  . Gastroesophageal reflux disease 07/08/2018  . Vaccine counseling 07/08/2018  . Estrogen deficiency 07/08/2018  . Post-menopausal 07/08/2018  . Need for pneumococcal vaccination 07/08/2018    Past Surgical History:  Procedure Laterality Date  . BREAST REDUCTION SURGERY    . CARPAL TUNNEL RELEASE     right  . COLONOSCOPY  4782   [place], [place]  . lichenoid keratosis  09/05/11   Skin, left anterior shouldee  . PARTIAL HYSTERECTOMY     still has ovaries  . WISDOM TOOTH EXTRACTION       OB History    Gravida  0   Para  0   Term  0   Preterm  0   AB  0   Living  0     SAB  0   TAB  0   Ectopic  0   Multiple  0   Live Births  0           Family History  Problem Relation Age of Onset  . Alzheimer's disease Mother   . Other Father        cognitive decline  . COPD Father        smoker  . Protein S deficiency Other   . Heart disease Neg Hx   . Diabetes Neg Hx   . Cancer Neg Hx   . Stroke Neg Hx   . Hypertension Neg Hx   . Hyperlipidemia Neg Hx     Social History   Tobacco Use  . Smoking status: Never Smoker  . Smokeless tobacco: Never Used  Vaping Use  . Vaping Use: Never used  Substance Use Topics  . Alcohol use: Not Currently    Alcohol/week: 5.0 standard drinks    Types: 5 Glasses of wine per week     Comment: none in a  year (as of 10/08/19)  . Drug use: No    Home Medications Prior to Admission medications   Medication Sig Start Date End Date Taking? Authorizing Provider  acetaminophen (TYLENOL) 325 MG tablet Take 2 tablets (650 mg total) by mouth every 6 (six) hours as needed for mild pain (or Fever >/= 101). 08/22/19  Yes Elgergawy, Silver Huguenin, MD  bisacodyl (DULCOLAX) 5 MG EC tablet Take 10 mg by mouth daily as needed for moderate constipation.   Yes [provider]  diazepam (VALIUM) 5 MG tablet Take 1 tablet (5 mg total) by mouth every 12 (twelve) hours as needed for anxiety. Patient taking differently: Take 2.5 mg by mouth every 12 (twelve) hours as needed for anxiety.  09/04/19 09/03/20 Yes Tysinger, Camelia Eng, PA-C  megestrol (MEGACE) 40 MG/ML suspension Take 5 mLs (200 mg total) by mouth daily. Patient taking differently: Take 400 mg by mouth daily.  09/10/19 09/09/20 Yes Tysinger, Camelia Eng, PA-C  methimazole (TAPAZOLE) 5 MG tablet Take 1 tablet (5 mg total) by mouth 3 (three) times daily. 10/06/19  Yes Tysinger, Camelia Eng, PA-C  omeprazole (PRILOSEC) 40 MG capsule Take 1 capsule by mouth once daily Patient taking differently: Take 40 mg by mouth daily.  11/12/18  Yes Tysinger, Camelia Eng, PA-C  ondansetron (ZOFRAN-ODT) 8 MG disintegrating tablet Take 1 tablet (8 mg total) by mouth every 8 (eight) hours as needed. Patient taking differently: Take 8 mg by mouth every 8 (eight) hours as needed for nausea or vomiting.  09/10/19  Yes Tysinger, Camelia Eng, PA-C  propranolol ER (INDERAL LA) 60 MG 24 hr capsule Take 1 capsule (60 mg total) by mouth daily. 10/06/19 10/05/20 Yes Tysinger, Camelia Eng, PA-C  sertraline (ZOLOFT) 50 MG tablet Take 50 mg by mouth daily. 09/24/19  Yes [provider]  Vitamin D, Ergocalciferol, (DRISDOL) 1.25 MG (50000 UNIT) CAPS capsule Take 1 capsule (50,000 Units total) by mouth every 7 (seven) days. Patient taking differently: Take 50,000 Units by mouth every Wednesday.   07/22/19  Yes Tysinger, Camelia Eng, PA-C    Allergies    Patient has no known allergies.  Review of Systems   Review of Systems  Unable to perform ROS: Mental status change    Physical Exam Updated Vital Signs BP 101/77   Pulse 81   Temp 98 F (36.7 C) (Oral)   Resp 15   SpO2 98%   Physical Exam Vitals reviewed.  Constitutional:      Comments: Sitting in bed with eyes closed.  Cardiovascular:     Rate and  Rhythm: Regular rhythm.  Pulmonary:     Breath sounds: No wheezing or rhonchi.  Abdominal:     Tenderness: There is no abdominal tenderness.  Musculoskeletal:        General: No tenderness.  Skin:    General: Skin is warm.  Neurological:     Comments: Sitting in bed with eyes closed.  Will answer some questions mostly yes or no.  Will move extremities.  Does have tremors in the upper extremities.     ED Results / Procedures / Treatments   Labs (all labs ordered are listed, but only abnormal results are displayed) Labs Reviewed  COMPREHENSIVE METABOLIC PANEL - Abnormal; Notable for the following components:      Result Value   Potassium 3.4 (*)    Glucose, Bld 102 (*)    Total Protein 6.4 (*)    AST 49 (*)    ALT 50 (*)    All other components within normal limits  CBG MONITORING, ED - Abnormal; Notable for the following components:   Glucose-Capillary 110 (*)    All other components within normal limits  CBG MONITORING, ED - Abnormal; Notable for the following components:   Glucose-Capillary 108 (*)    All other components within normal limits  CBC  URINALYSIS, ROUTINE W REFLEX MICROSCOPIC    EKG EKG Interpretation  Date/Time:  Tuesday October 20 2019 08:23:46 EDT Ventricular Rate:  73 PR Interval:    QRS Duration: 96 QT Interval:  378 QTC Calculation: 417 R Axis:   55 Text Interpretation: Sinus rhythm Confirmed by Davonna Belling [phone]) on 10/20/2019 10:05:15 AM   Radiology No results found.  Procedures Procedures (including critical  care time)  Medications Ordered in ED Medications - No data to display  ED Course  I have reviewed the triage vital signs and the nursing notes.  Pertinent labs & imaging results that were available during my care of the patient were reviewed by me and considered in my medical decision making (see chart for details).    MDM Rules/Calculators/A&P                          Patient presents with continuing mental status changes.  Rapidly progressing dementia.  No clear cause has been found yet on outpatient work-up.  However not doing well at home.  Urinalysis still pending but I doubt this would have been the cause consistently over the last 3 months.  Discussed with neuro hospitalist who will see patient.  Reviewed notes from Dr. Jannifer Franklin, the patient's outpatient neurologist to gave some recommendations.  Overall may end up having to be a goals of care discussion since no clear cause has been found.  Will discuss with hospitalist Final Clinical Impression(s) / ED Diagnoses Final diagnoses:  Encephalopathy    Rx / DC Orders ED Discharge Orders    None       Davonna Belling, MD 10/20/19 1007

## 2019-10-20 NOTE — ED Triage Notes (Signed)
To ED for further eval of AMS. Per pts husband she is under the care of neurology for 'neurological. He doesn't think its psychological'. Husband states he noticed over the past few days that pt is weaker. Yesterday she was having difficulty swallowing, standing, and walking. Recently placed on Seroquel for sleep. Pts husband states that tonight while sleeping pt 'was talking to people in her sleep', so he brought her to ED. Pt appears semi- catatonic when sitting in wheelchair without any verbal stimuli. Will respond appropriately to verbal but stares past you without direct eye contact. Will follow commands. No facial droop noted. Pt with a slight tremor that husband states is not new. Husband will stay with pt in waiting area.

## 2019-10-21 DIAGNOSIS — I700 Atherosclerosis of aorta: Secondary | ICD-10-CM | POA: Diagnosis not present

## 2019-10-21 DIAGNOSIS — Z23 Encounter for immunization: Secondary | ICD-10-CM | POA: Diagnosis present

## 2019-10-21 DIAGNOSIS — J9811 Atelectasis: Secondary | ICD-10-CM | POA: Diagnosis not present

## 2019-10-21 DIAGNOSIS — I878 Other specified disorders of veins: Secondary | ICD-10-CM | POA: Diagnosis not present

## 2019-10-21 DIAGNOSIS — J984 Other disorders of lung: Secondary | ICD-10-CM | POA: Diagnosis not present

## 2019-10-21 DIAGNOSIS — E8809 Other disorders of plasma-protein metabolism, not elsewhere classified: Secondary | ICD-10-CM | POA: Diagnosis not present

## 2019-10-21 DIAGNOSIS — F0390 Unspecified dementia without behavioral disturbance: Secondary | ICD-10-CM | POA: Diagnosis present

## 2019-10-21 DIAGNOSIS — Z20822 Contact with and (suspected) exposure to covid-19: Secondary | ICD-10-CM | POA: Diagnosis present

## 2019-10-21 DIAGNOSIS — K429 Umbilical hernia without obstruction or gangrene: Secondary | ICD-10-CM | POA: Diagnosis not present

## 2019-10-21 DIAGNOSIS — R531 Weakness: Secondary | ICD-10-CM | POA: Diagnosis not present

## 2019-10-21 DIAGNOSIS — F447 Conversion disorder with mixed symptom presentation: Secondary | ICD-10-CM | POA: Diagnosis not present

## 2019-10-21 DIAGNOSIS — R627 Adult failure to thrive: Secondary | ICD-10-CM | POA: Diagnosis not present

## 2019-10-21 DIAGNOSIS — Z825 Family history of asthma and other chronic lower respiratory diseases: Secondary | ICD-10-CM | POA: Diagnosis not present

## 2019-10-21 DIAGNOSIS — Z87440 Personal history of urinary (tract) infections: Secondary | ICD-10-CM | POA: Diagnosis not present

## 2019-10-21 DIAGNOSIS — R262 Difficulty in walking, not elsewhere classified: Secondary | ICD-10-CM | POA: Diagnosis present

## 2019-10-21 DIAGNOSIS — G9341 Metabolic encephalopathy: Secondary | ICD-10-CM | POA: Diagnosis present

## 2019-10-21 DIAGNOSIS — F064 Anxiety disorder due to known physiological condition: Secondary | ICD-10-CM | POA: Diagnosis not present

## 2019-10-21 DIAGNOSIS — R7401 Elevation of levels of liver transaminase levels: Secondary | ICD-10-CM | POA: Diagnosis not present

## 2019-10-21 DIAGNOSIS — G9340 Encephalopathy, unspecified: Secondary | ICD-10-CM | POA: Diagnosis not present

## 2019-10-21 DIAGNOSIS — E876 Hypokalemia: Secondary | ICD-10-CM | POA: Diagnosis present

## 2019-10-21 DIAGNOSIS — F419 Anxiety disorder, unspecified: Secondary | ICD-10-CM | POA: Diagnosis not present

## 2019-10-21 DIAGNOSIS — E0590 Thyrotoxicosis, unspecified without thyrotoxic crisis or storm: Secondary | ICD-10-CM | POA: Diagnosis present

## 2019-10-21 DIAGNOSIS — F410 Panic disorder [episodic paroxysmal anxiety] without agoraphobia: Secondary | ICD-10-CM | POA: Diagnosis present

## 2019-10-21 DIAGNOSIS — Z515 Encounter for palliative care: Secondary | ICD-10-CM | POA: Diagnosis not present

## 2019-10-21 DIAGNOSIS — E559 Vitamin D deficiency, unspecified: Secondary | ICD-10-CM | POA: Diagnosis present

## 2019-10-21 DIAGNOSIS — Z820 Family history of epilepsy and other diseases of the nervous system: Secondary | ICD-10-CM | POA: Diagnosis not present

## 2019-10-21 DIAGNOSIS — D72829 Elevated white blood cell count, unspecified: Secondary | ICD-10-CM | POA: Diagnosis not present

## 2019-10-21 DIAGNOSIS — A8100 Creutzfeldt-Jakob disease, unspecified: Secondary | ICD-10-CM | POA: Diagnosis present

## 2019-10-21 DIAGNOSIS — Z79899 Other long term (current) drug therapy: Secondary | ICD-10-CM | POA: Diagnosis not present

## 2019-10-21 DIAGNOSIS — E46 Unspecified protein-calorie malnutrition: Secondary | ICD-10-CM | POA: Diagnosis not present

## 2019-10-21 DIAGNOSIS — Z8719 Personal history of other diseases of the digestive system: Secondary | ICD-10-CM | POA: Diagnosis not present

## 2019-10-21 DIAGNOSIS — R1310 Dysphagia, unspecified: Secondary | ICD-10-CM | POA: Diagnosis present

## 2019-10-21 DIAGNOSIS — K219 Gastro-esophageal reflux disease without esophagitis: Secondary | ICD-10-CM | POA: Diagnosis present

## 2019-10-21 DIAGNOSIS — Z7189 Other specified counseling: Secondary | ICD-10-CM | POA: Diagnosis not present

## 2019-10-21 DIAGNOSIS — R32 Unspecified urinary incontinence: Secondary | ICD-10-CM | POA: Diagnosis present

## 2019-10-21 DIAGNOSIS — F411 Generalized anxiety disorder: Secondary | ICD-10-CM | POA: Diagnosis not present

## 2019-10-21 LAB — T4: T4, Total: 10.5 ug/dL (ref 4.5–12.0)

## 2019-10-21 LAB — BASIC METABOLIC PANEL
Anion gap: 10 (ref 5–15)
BUN: 11 mg/dL (ref 8–23)
CO2: 19 mmol/L — ABNORMAL LOW (ref 22–32)
Calcium: 8.6 mg/dL — ABNORMAL LOW (ref 8.9–10.3)
Chloride: 111 mmol/L (ref 98–111)
Creatinine, Ser: 0.77 mg/dL (ref 0.44–1.00)
GFR calc Af Amer: >60 mL/min (ref >60)
GFR calc non Af Amer: >60 mL/min (ref >60)
Glucose, Bld: 121 mg/dL — ABNORMAL HIGH (ref 70–99)
Potassium: 3.7 mmol/L (ref 3.5–5.1)
Sodium: 140 mmol/L (ref 135–145)

## 2019-10-21 LAB — CBC
HCT: 41.5 % (ref 36.0–46.0)
Hemoglobin: 13.9 g/dL (ref 12.0–15.0)
MCH: 31.9 pg (ref 26.0–34.0)
MCHC: 33.5 g/dL (ref 30.0–36.0)
MCV: 95.2 fL (ref 80.0–100.0)
Platelets: 245 10*3/uL (ref 150–400)
RBC: 4.36 MIL/uL (ref 3.87–5.11)
RDW: 14.4 % (ref 11.5–15.5)
WBC: 5.3 10*3/uL (ref 4.0–10.5)
nRBC: 0 % (ref 0.0–0.2)

## 2019-10-21 LAB — T3: T3, Total: 73 ng/dL (ref 71–180)

## 2019-10-21 NOTE — Progress Notes (Addendum)
Occupational Therapy Evaluation Patient Details Name: Hannah Padilla MRN: [id number] DOB: 1950/09/18 Today's Date: 10/21/2019    History of Present Illness 69 yo female with onset of Parkinson's like symptoms was admitted to ED, with recurrent changes of metabolic encephalopathy, AMS, panic attacks, and tremors. Pt is rapidly declining in her mobility to approach dependent care.  Neurologists are working to diagnose her condition. PMHx:  [place] spotted fever, UTI, dementia, incontinence, colon polyp, mood changes, hyperthyroidism, anxiety,    Clinical Impression   Pt from home with husband. Friend reports pt has had a rapid decline in function and mobility, especially over the past week.Until around 3 weeks ago, pt was walking on her own and doing her self care. States this decline began @ 3 months ago.  Currently pt  requires Max A +2 for bed mobility and sit - stand. Total A at this time for ADL tasks. Pt verbalizing although difficult to understand at times.Improved level of arousal and ability to interact once upright in seated position. At this time recommend rehab at [facility]. Will follow acutely.   Pt with long nails keeping hands ini tight fisted position. Asked family to trim nails. Will benefit from palm protector - will deliver tomorrow.   Follow Up Recommendations  SNF;Supervision/Assistance - 24 hour    Equipment Recommendations  Other (comment) (TBA)    Recommendations for Other Services       Precautions / Restrictions Precautions Precautions: Fall Precaution Comments: Tremors noted in all extremities Restrictions Weight Bearing Restrictions: No      Mobility Bed Mobility Overal bed mobility: Needs Assistance Bed Mobility: Supine to Sit;Sit to Supine     Supine to sit: Max assist;+2 for physical assistance Sit to supine: Max assist;+2 for physical assistance   General bed mobility comments: Required assist for trunk and LE throughout. Pt requiring cues for  sequencing. Required assist to scoot hips towards EOB.   Transfers Overall transfer level: Needs assistance Equipment used: 2 person hand held assist Transfers: Sit to/from Stand Sit to Stand: Max assist;+2 physical assistance         General transfer comment: Pt with increased tremors in standing. Reports she felt like she was falling. MAx +2 for lift assist and steadying to stand.     Balance Overall balance assessment: Needs assistance;History of Falls   Sitting balance-Leahy Scale: Poor       Standing balance-Leahy Scale: Zero                             ADL either performed or assessed with clinical judgement   ADL Overall ADL's : Needs assistance/impaired                                     Functional mobility during ADLs: Maximal assistance;+2 for physical assistance General ADL Comments: total A for ADL tasks     Vision   Additional Comments: will further assess     Perception Perception Comments: most likely impaired   Praxis Praxis Praxis tested?: Deficits Deficits: Initiation;Organization    Pertinent Vitals/Pain Pain Assessment: Faces Faces Pain Scale: Hurts a little bit Pain Location: general discomfort Pain Descriptors / Indicators: Discomfort;Grimacing Pain Intervention(s): Limited activity within patient's tolerance     Hand Dominance Right   Extremity/Trunk Assessment Upper Extremity Assessment Upper Extremity Assessment: Generalized weakness;RUE deficits/detail;LUE deficits/detail RUE Deficits / Details: holding  hands fisted with nails digging into palms; elbows extended however able to move through full P/AAROM; not using hand functionally on command RUE Coordination: decreased fine motor;decreased gross motor LUE Deficits / Details: similar to R LUE Coordination: decreased fine motor;decreased gross motor   Lower Extremity Assessment Lower Extremity Assessment: Defer to PT evaluation (holding B feet in  plantarflexion)   Cervical / Trunk Assessment Cervical / Trunk Assessment: Kyphotic (posterior bias)   Communication Communication Communication: Expressive difficulties;Receptive difficulties   Cognition Arousal/Alertness: Awake/alert Behavior During Therapy: Anxious;Restless;Flat affect Overall Cognitive Status: Impaired/Different from baseline Area of Impairment: Orientation;Attention;Memory;Following commands;Safety/judgement;Awareness;Problem solving                 Orientation Level: Place;Time;Situation Current Attention Level: Focused Memory: Decreased recall of precautions;Decreased short-term memory Following Commands: Follows one step commands inconsistently Safety/Judgement: Decreased awareness of safety;Decreased awareness of deficits Awareness: Intellectual Problem Solving: Slow processing;Decreased initiation;Difficulty sequencing;Requires verbal cues;Requires tactile cues General Comments: Could only report that her birthday was on the [birth year], but could not tell the date. Had difficulty sequencing.    General Comments  Pt's friend present and very tearful; states pt has had a rapid decline over the past week; husband not present as he had not slept in 3 days per friend    Exercises     Shoulder Staley expects to be discharged to:: Private residence Living Arrangements: Spouse/significant other Available Help at Discharge: Family;Available 24 hours/day Type of Home: House Home Access: Stairs to enter     Home Layout: One level     Bathroom Shower/Tub: Tub/shower unit         Home Equipment: Bedside commode          Prior Functioning/Environment Level of Independence: Needs assistance  Gait / Transfers Assistance Needed: recently has needed help to walk and transfer, gait for very short trips of a few steps ADL's / Homemaking Assistance Needed: husband cares for home, assists wife to dress and  bathe Communication / Swallowing Assistance Needed: soft vocal quality; mumbling at times and sometimes coherant          OT Problem List: Decreased strength;Decreased range of motion;Decreased activity tolerance;Impaired balance (sitting and/or standing);Impaired vision/perception;Decreased coordination;Decreased safety awareness;Decreased cognition;Decreased knowledge of use of DME or AE;Impaired UE functional use;Pain      OT Treatment/Interventions: Self-care/ADL training;Therapeutic exercise;Neuromuscular education;DME and/or AE instruction;Therapeutic activities;Cognitive remediation/compensation;Visual/perceptual remediation/compensation;Patient/family education;Balance training    OT Goals(Current goals can be found in the care plan section) Acute Rehab OT Goals Patient Stated Goal: per friend to find out what is wrong OT Goal Formulation: With family Time For Goal Achievement: 11/04/19 Potential to Achieve Goals: Fair  OT Frequency: Min 2X/week   Barriers to D/C:            Co-evaluation PT/OT/SLP Co-Evaluation/Treatment: Yes Reason for Co-Treatment: Complexity of the patient's impairments (multi-system involvement);Necessary to address cognition/behavior during functional activity;For patient/therapist safety PT goals addressed during session: Mobility/safety with mobility;Balance OT goals addressed during session: ADL's and self-care      AM-PAC OT "6 Clicks" Daily Activity     Outcome Measure Help from another person eating meals?: Total Help from another person taking care of personal grooming?: Total Help from another person toileting, which includes using toliet, bedpan, or urinal?: Total Help from another person bathing (including washing, rinsing, drying)?: Total Help from another person to put on and taking off regular upper body clothing?: Total Help from another person to put on and  taking off regular lower body clothing?: Total 6 Click Score: 6   End of  Session Nurse Communication: Mobility status;Other (comment) (need for palm protectors)  Activity Tolerance: Patient tolerated treatment well Patient left: in bed;with family/visitor present  OT Visit Diagnosis: Unsteadiness on feet (R26.81);Other abnormalities of gait and mobility (R26.89);Muscle weakness (generalized) (M62.81);Other symptoms and signs involving cognitive function;Pain Pain - part of body:  (generalized)                Time: 1991-4445 OT Time Calculation (min): 27 min Charges:  OT General Charges $OT Visit: 1 Visit OT Evaluation $OT Eval Moderate Complexity: [place], OT/L   Acute OT Clinical Specialist Acute Rehabilitation Services Pager [phone] Office [phone]   [facility] 10/21/2019, 2:03 PM

## 2019-10-21 NOTE — Procedures (Signed)
Patient Name: Hannah Padilla  MRN: [id number]  Epilepsy Attending: Lora Havens  Referring Physician/Provider: Dr Roland Rack Date: 10/20/2019  Duration: 24.31mins  Patient history: 69yo F with ams, hallucination, concern for rapidly progressing dementia. EEG to evaluate for seizure.  Level of alertness: Awake  AEDs during EEG study: None  Technical aspects: This EEG study was done with scalp electrodes positioned according to the 10-20 International system of electrode placement. Electrical activity was acquired at a sampling rate of 500Hz  and reviewed with a high frequency filter of 70Hz  and a low frequency filter of 1Hz . EEG data were recorded continuously and digitally stored.   Description: No clear posterior dominant rhythm was seen. EEG showed continuous generalized polymorphic 6-9Hz  theta-alpha activity. Two episodes of right hand tremor like movements were recorded. Concomitant eeg before, during and after the episodes didn't show eeg change to suggest seizure.   Hyperventilation and photic stimulation were not performed.     ABNORMALITY -Continuous slow, generalized  IMPRESSION: This study is suggestive of mild to moderate diffuse encephalopathy, nonspecific etiology. No seizures or epileptiform discharges were seen throughout the recording.  Tow episodes of right hand tremor like movements were recorded without concomitant eeg change and were most likely not epileptic. However, focal motor seizures may not be seen on scalp eeg. Therefore, clinical correlation is recommended.   [name]

## 2019-10-21 NOTE — Progress Notes (Addendum)
[facility]                                                                              Patient Demographics  Hannah Padilla, is a 69 y.o. female, DOB - 07-23-1950, YNW:[id number]  Admit date - 10/20/2019   Admitting Physician Norval Morton, MD  Outpatient Primary MD for the patient is Tysinger, Camelia Eng, PA-C  Outpatient specialists:   LOS - 0  days   Medical records reviewed and are as summarized below:    Chief Complaint  Patient presents with  . Altered Mental Status       Brief summary   Patient is a 69 year old female with history of subclinical hyperthyroidism, RMSF, UTIs presented with progressive decline over the last 3 months.  Prior to the onset of symptoms patient was healthy and able to complete all of her ADLs without any assistance.  Per son, her PCP had diagnosed her with hyperthyroidism, and was placed on methimazole and propranolol.  Reported that she had been having panic attacks, tremor, intermittent sweating, generalized weakness, falls, difficulty swallowing, hallucinations, insomnia and bowel and urine incontinence. Patient had extensive work-up including lumbar puncture, MRI, pheochromocytoma work-up, RMSF was positive.  Patient is currently followed outpatient by Dr. Jannifer Franklin.  She was placed on Valium which helped somewhat with the panic attacks Seroquel was attempted however patient became catatonic and this was continued after 2 doses. Patient was admitted for further work-up.  Assessment & Plan    Principal Problem: Acute metabolic encephalopathy with relatively rapid and progressive decline - significant neurological decline in the last 3 months.  Still unclear etiology. -Neurology following, started on high-dose steroids x 5 days -Further work-up by neurology including heavy metals, repeat EEG, delta LA, paraneoplastic panel -EEG showed mild to moderate diffuse encephalopathy, nonspecific.  2 episodes of right hand tremor-like movements  were most likely nonepileptic  Active Problems: Subclinical hyperthyroidism -Does not fully explain patient's symptoms with progressive neuro decline.  Home regimen includes methimazole 5 mg 3 times daily.  Admitting physician, Dr. Tamala Julian discussed with Dr. Delrae Rend. -TSH 0.8, free T4 1.96 (improving from 2.33), total T4 10.5, T3 73   Dr. Buddy Duty will be able to see her outpatient, will send ambulatory referral on discharge -Continue methimazole and propranolol     Anxiety, insomnia -Per patient's husband at the bedside, Valium has helped at home 7 AM and 7 PM, will place her on twice daily dosing with home schedule    Transaminitis -Follow LFTs in a.m.  Dysphagia SLP evaluation to rule out any aspiration  Code Status: Full code DVT Prophylaxis:  Lovenox Family Communication: Discussed all imaging results, lab results, explained to the patient's husband at the bedside    Disposition Plan:     Status is: Inpatient, IV treatments appropriate due to intensity of illness or inability to take PO and Inpatient level of care appropriate due to severity of illness  Dispo: The patient is from: Home              Anticipated d/c is to: TBD  Anticipated d/c date is: 3 days              Patient currently is not medically stable to d/c.  Currently on high-dose IV steroids, very tremulous, acute encephalopathy, high risk of aspiration      Time Spent in minutes   35 minutes  Procedures:  EEG  Consultants:   Neurology  Antimicrobials:   Anti-infectives (From admission, onward)   None          Medications  Scheduled Meds: . enoxaparin (LOVENOX) injection  40 mg Subcutaneous Q24H  . methimazole  5 mg Oral TID  . pantoprazole  40 mg Oral Daily  . propranolol ER  60 mg Oral Daily  . sertraline  50 mg Oral Daily  . sodium chloride flush  3 mL Intravenous Q12H   Continuous Infusions: . methylPREDNISolone (SOLU-MEDROL) injection 1,000 mg (10/21/19 1039)   PRN  Meds:.acetaminophen **OR** acetaminophen, albuterol, bisacodyl, diazepam, ondansetron **OR** ondansetron (ZOFRAN) IV      Subjective:   Patrick Sohm was seen and examined today.  Per husband at the bedside, did not have a good sleep, still very tremulous, repeating words.  No acute fevers or chills.  No active nausea vomiting, shortness of breath or any pain.  Afebrile  Objective:   Vitals:   10/21/19 0600 10/21/19 0630 10/21/19 0700 10/21/19 1019  BP: 105/69 108/75 100/64 128/85  Pulse: 61 75 61 83  Resp: 19 11 15 17   Temp:    (!) 97.5 F (36.4 C)  TempSrc:    Oral  SpO2: 95% 97% 94% 97%    Intake/Output Summary (Last 24 hours) at 10/21/2019 1213 Last data filed at 10/20/2019 2332 Gross per 24 hour  Intake 61.81 ml  Output --  Net 61.81 ml     Wt Readings from Last 3 Encounters:  10/08/19 64.9 kg  10/05/19 65.5 kg  09/29/19 66.2 kg    Physical Exam  General: Alert and oriented x self, place, otherwise repeating words, rambling  Cardiovascular: S1 S2 clear, RRR. No pedal edema b/l  Respiratory: CTAB, no wheezing, rales or rhonchi  Gastrointestinal: Soft, nontender, nondistended, NBS  Ext: no pedal edema bilaterally  Neuro: global weakness with tremors  Musculoskeletal: No cyanosis, clubbing  Skin: No rashes  Psych:    Data Reviewed:  I have personally reviewed following labs and imaging studies  Micro Results Recent Results (from the past 240 hour(s))  TIQ-MISC     Status: None   Collection Time: 10/15/19  8:54 AM  Result Value Ref Range Status   QUESTION/PROBLEM:   Final    Comment: . There is a question regarding the following specimen submitted and/or the test requested. .    QUESTION: VERIFY TEST 14-3-3  Final    Comment: REQUESTED INFORMATION _________________________________ . AUTHORIZED SIGNATURE __________________________________ . TO PREVENT FURTHER DELAYS IN TESTING, PLEASE COMPLETE INFORMATION ABOVE AND EITHER FAX TO [phone] OR   EMAIL TO [email or web address] TO  RESOLVE THIS ORDER.   SARS Coronavirus 2 by RT PCR (hospital order, performed in [facility] hospital lab) Nasopharyngeal Nasopharyngeal Swab     Status: None   Collection Time: 10/20/19 10:24 AM   Specimen: Nasopharyngeal Swab  Result Value Ref Range Status   SARS Coronavirus 2 NEGATIVE NEGATIVE Final    Comment: (NOTE) SARS-CoV-2 target nucleic acids are NOT DETECTED.  The SARS-CoV-2 RNA is generally detectable in upper and lower respiratory specimens during the acute phase of infection. The lowest concentration of SARS-CoV-2 viral copies this assay  can detect is 250 copies / mL. A negative result does not preclude SARS-CoV-2 infection and should not be used as the sole basis for treatment or other patient management decisions.  A negative result may occur with improper specimen collection / handling, submission of specimen other than nasopharyngeal swab, presence of viral mutation(s) within the areas targeted by this assay, and inadequate number of viral copies (<250 copies / mL). A negative result must be combined with clinical observations, patient history, and epidemiological information.  Fact Sheet for Patients:   StrictlyIdeas.no  Fact Sheet for Healthcare Providers: BankingDealers.co.za  This test is not yet approved or  cleared by the [place] FDA and has been authorized for detection and/or diagnosis of SARS-CoV-2 by FDA under an Emergency Use Authorization (EUA).  This EUA will remain in effect (meaning this test can be used) for the duration of the COVID-19 declaration under Section 564(b)(1) of the Act, 21 U.S.C. section 360bbb-3(b)(1), unless the authorization is terminated or revoked sooner.  Performed at [facility], [place] [address], [place], [place] [address]     Radiology Reports MR BRAIN W WO CONTRAST  Result Date: 10/11/2019  [facility] [address], [place], [place] [address] [phone] NEUROIMAGING REPORT STUDY DATE: 10/10/2019 PATIENT NAME: YETTA MARCEAUX DOB: Jun 29, 1950 MRN: [id number] EXAM: MRI Brain with and without contrast ORDERING CLINICIAN: Kathrynn Ducking, MD CLINICAL HISTORY: 69 year old woman with encephalopathy COMPARISON FILMS: MRI 08/11/2019 TECHNIQUE:MRI of the brain with and without contrast was obtained utilizing 5 mm axial slices with T1, T2, T2 flair, SWI and diffusion weighted views.  T1 sagittal, T2 coronal and postcontrast views in the axial and coronal plane were obtained. CONTRAST: 12 ml [facility] IMAGING SITE: [facility], [place]. FINDINGS: On sagittal images, the spinal cord is imaged caudally to C3 and is normal in caliber.   The contents of the posterior fossa are of normal size and position.   The pituitary gland and optic chiasm appear normal.    There is mild generalized cortical atrophy.  Ventricles are not distorted.  There are no abnormal extra-axial collections of fluid.  In the hemispheres, there are some scattered T2/FLAIR hyperintense foci predominantly in the subcortical and deep white matter.  None of the foci appear to be acute.  They do not enhance there do not appear to be new lesions compared to the 08/11/2019 MRI.  The cerebellum and brainstem appears normal.   The deep gray matter appears normal.  Diffusion weighted images are normal.  Susceptibility weighted images are normal.   The orbits appear normal.   The VIIth/VIIIth nerve complex appears normal.  The mastoid air cells appear normal.  The paranasal sinuses appear normal.  Flow voids are identified within the major intracerebral arteries.  After the infusion of contrast material, a normal enhancement pattern is noted.   This MRI of the brain with and without contrast shows the following: 1.   Mild generalized cortical atrophy, stable compared to the 08/11/2019 MRI. 2.   Scattered T2/FLAIR hyperintense foci,  predominantly in the subcortical and deep white matter of the hemispheres, most consistent with chronic microvascular ischemic change.  This is stable compared to the 08/11/2019 MRI. 3.   Normal enhancement pattern no acute findings. INTERPRETING PHYSICIAN: Richard A. Felecia Shelling, MD, PhD, FAAN Certified in  Neuroimaging by [employer]   EEG adult  Result Date: 10/21/2019 Lora Havens, MD     10/21/2019  7:50 AM  Patient Name: QUAMESHA MULLET MRN: [id number] Epilepsy Attending: Lora Havens Referring Physician/Provider: Dr Roland Rack Date: 10/20/2019 Duration: 24.28mins Patient history: 69yo F with ams, hallucination, concern for rapidly progressing dementia. EEG to evaluate for seizure. Level of alertness: Awake AEDs during EEG study: None Technical aspects: This EEG study was done with scalp electrodes positioned according to the 10-20 International system of electrode placement. Electrical activity was acquired at a sampling rate of 500Hz  and reviewed with a high frequency filter of 70Hz  and a low frequency filter of 1Hz . EEG data were recorded continuously and digitally stored. Description: No clear posterior dominant rhythm was seen. EEG showed continuous generalized polymorphic 6-9Hz  theta-alpha activity. Two episodes of right hand tremor like movements were recorded. Concomitant eeg before, during and after the episodes didn't show eeg change to suggest seizure.   Hyperventilation and photic stimulation were not performed.   ABNORMALITY -Continuous slow, generalized IMPRESSION: This study is suggestive of mild to moderate diffuse encephalopathy, nonspecific etiology. No seizures or epileptiform discharges were seen throughout the recording. Tow episodes of right hand tremor like movements were recorded without concomitant eeg change and were most likely not epileptic. However, focal motor seizures may not be seen on scalp eeg. Therefore, clinical correlation is recommended.  Priyanka [name]   DG FL GUIDED LUMBAR PUNCTURE  Result Date: 10/15/2019 CLINICAL DATA:  Rapidly progressive dementia. EXAM: DIAGNOSTIC LUMBAR PUNCTURE UNDER FLUOROSCOPIC GUIDANCE FLUOROSCOPY TIME:  Fluoroscopy time: 14 seconds Radiation Exposure Index (if provided by the fluoroscopic device): 1.0 mGy Number of Acquired Spot Images: 0 PROCEDURE: Informed consent was obtained from the patient prior to the procedure, including potential complications of headache, allergy, and pain. With the patient left lateral decubitus, the lower back was prepped with Betadine. 1% Lidocaine was used for local anesthesia. Lumbar puncture was performed at the L3-L4 level via a left interlaminar approach using a 3.5 inch 20 gauge needle with return of clear, colorless CSF with an opening pressure of 14 cm water. 14 ml of CSF were obtained for laboratory studies. The patient tolerated the procedure well and there were no apparent complications. IMPRESSION: Technically successful fluoroscopically guided lumbar puncture. Electronically Signed   By: Titus Dubin M.D.   On: 10/15/2019 09:22    Lab Data:  CBC: Recent Labs  Lab 10/20/19 0553 10/21/19 0505  WBC 8.7 5.3  HGB 14.7 13.9  HCT 44.1 41.5  MCV 94.4 95.2  PLT 289 665   Basic Metabolic Panel: Recent Labs  Lab 10/20/19 0553 10/21/19 0505  NA 140 140  K 3.4* 3.7  CL 108 111  CO2 22 19*  GLUCOSE 102* 121*  BUN 9 11  CREATININE 0.85 0.77  CALCIUM 9.9 8.6*   GFR: Estimated Creatinine Clearance: 65.5 mL/min (by C-G formula based on SCr of 0.77 mg/dL). Liver Function Tests: Recent Labs  Lab 10/20/19 0553  AST 49*  ALT 50*  ALKPHOS 59  BILITOT 1.1  PROT 6.4*  ALBUMIN 4.0   No results for input(s): LIPASE, AMYLASE in the last 168 hours. No results for input(s): AMMONIA in the last 168 hours. Coagulation Profile: No results for input(s): INR, PROTIME in the last 168 hours. Cardiac Enzymes: No results for input(s): CKTOTAL, CKMB, CKMBINDEX,  TROPONINI in the last 168 hours. BNP (last 3 results) No results for input(s): PROBNP in the last 8760 hours. HbA1C: No results for input(s): HGBA1C in the last 72 hours. CBG: Recent Labs  Lab 10/20/19 0528 10/20/19 0854  GLUCAP 110* 108*  Lipid Profile: No results for input(s): CHOL, HDL, LDLCALC, TRIG, CHOLHDL, LDLDIRECT in the last 72 hours. Thyroid Function Tests: Recent Labs    10/20/19 1812  TSH 0.851  T4TOTAL 10.5  FREET4 1.96*   Anemia Panel: No results for input(s): VITAMINB12, FOLATE, FERRITIN, TIBC, IRON, RETICCTPCT in the last 72 hours. Urine analysis:    Component Value Date/Time   COLORURINE YELLOW 10/20/2019 [place] 10/20/2019 1151   LABSPEC 1.012 10/20/2019 1151   LABSPEC 1.015 10/05/2019 0929   PHURINE 6.0 10/20/2019 1151   GLUCOSEU NEGATIVE 10/20/2019 1151   HGBUR NEGATIVE 10/20/2019 1151   BILIRUBINUR NEGATIVE 10/20/2019 1151   BILIRUBINUR negative 10/05/2019 0929   BILIRUBINUR NEG 11/26/2011 [place] 10/20/2019 1151   PROTEINUR NEGATIVE 10/20/2019 1151   UROBILINOGEN negative 11/26/2011 0931   UROBILINOGEN 0.2 03/25/2011 0633   NITRITE NEGATIVE 10/20/2019 1151   LEUKOCYTESUR NEGATIVE 10/20/2019 1151     Ranette Luckadoo M.D. [facility] 10/21/2019, 12:13 PM   Call night coverage person covering after 7pm

## 2019-10-21 NOTE — ED Notes (Signed)
Family at bed side will attempt to give some breakfast

## 2019-10-21 NOTE — Progress Notes (Signed)
Subjective: No significant changes  Exam: Vitals:   10/21/19 0700 10/21/19 1019  BP: 100/64 128/85  Pulse: 61 83  Resp: 15 17  Temp:  (!) 97.5 F (36.4 C)  SpO2: 94% 97%   Gen: In bed, NAD Resp: non-labored breathing, no acute distress Abd: soft, nt  Neuro: MS: Awake, alert, interactive, follows commands CN: Extraocular movements intact Motor: She has significant tremor throughout, slightly worse than yesterday Sensory: Intact light touch  Pertinent Labs: Free T4 1.96  Impression: 69 year old female with rapidly progressive dementia of unclear etiology.  Everything, her hyperreflexia, her tremor, trouble walking all appears relatively global, without focality.  CJD continues to be a possibility as does autoimmune encephalopathy.  Hypothyroidism can cause some of the symptoms described and she is being treated for this as well.  Given that this could be autoimmune even if her paraneoplastic panel is negative, I would still favor going ahead and starting empiric therapy with steroids to assess for response.  If she does not respond to steroids, then I do not think pursue further treatment without more definitive diagnosis.  Recommendations: 1) Solu-Medrol 1 g daily day 2/5 2) 24-hour urine for heavy metals, porphobilinogen, delta alae pending 3) treatment for her hyperthyroidism 4) neurology will follow  Roland Rack, MD [facility] [phone]  If 7pm- 7am, please page neurology on call as listed in [place].

## 2019-10-21 NOTE — Evaluation (Signed)
Speech Language Pathology Evaluation Patient Details Name: Hannah Padilla MRN: [id number] DOB: [birth year] Today's Date: 10/21/2019 Time: 1400-1420 SLP Time Calculation (min) (ACUTE ONLY): 20 min  Problem List:  Patient Active Problem List   Diagnosis Date Noted  . Acute metabolic encephalopathy 33/82/5053  . Hallucination 10/05/2019  . Dehydration 10/05/2019  . Dysphagia 10/05/2019  . Incontinence of feces 09/29/2019  . Urinary incontinence 09/29/2019  . Adrenal mass ([place]) 09/02/2019  . Decrease in appetite 09/02/2019  . Weight loss 09/02/2019  . Acute bilateral thoracic back pain 09/02/2019  . Metabolic encephalopathy 97/67/3419  . Subclinical hyperthyroidism 08/19/2019  . Panic attack 08/18/2019  . Urinary tract infection without hematuria 08/18/2019  . UTI (urinary tract infection) 08/12/2019  . Altered mental state 08/12/2019  . Grief 08/12/2019  . RMSF [facility] spotted fever) 07/27/2019  . Anxiety 07/21/2019  . Transaminitis 07/21/2019  . Abnormal thyroid blood test 07/21/2019  . Sleep disturbance 07/21/2019  . Hypokalemia 07/21/2019  . Leukocytosis 07/21/2019  . Tremor 07/21/2019  . Vitamin D deficiency 07/21/2019  . Palpitation 07/21/2019  . Vaginal bleeding 07/08/2018  . Encounter for hepatitis C screening test for low risk patient 07/08/2018  . Chronic nausea 07/08/2018  . Screening for cervical cancer 07/08/2018  . Vaginal mass 07/08/2018  . Gastroesophageal reflux disease 07/08/2018  . Vaccine counseling 07/08/2018  . Estrogen deficiency 07/08/2018  . Post-menopausal 07/08/2018  . Need for pneumococcal vaccination 07/08/2018   Past Medical History:  Past Medical History:  Diagnosis Date  . Colon polyp 2015  . Elevated serum free T4 level    husband report  . Farsightedness    wears glasses  . Mood change 2021   major change in affect, health issues with multiple ED visits, hospitalization, starting 07/10/2019  . RMSF [facility] spotted fever)  2021  . Urinary tract infection    Past Surgical History:  Past Surgical History:  Procedure Laterality Date  . BREAST REDUCTION SURGERY    . CARPAL TUNNEL RELEASE     right  . COLONOSCOPY  3790   [place], [place]  . lichenoid keratosis  09/05/11   Skin, left anterior shouldee  . PARTIAL HYSTERECTOMY     still has ovaries  . WISDOM TOOTH EXTRACTION     HPI:  69 yo female with onset of Parkinson's like symptoms was admitted to ED, with recurrent changes of metabolic encephalopathy, AMS, panic attacks, and tremors. Pt is rapidly declining in her mobility to approach dependent care.  Neurologists are working to diagnose her condition. PMHx:  [place] spotted fever, UTI, dementia, incontinence, colon polyp, mood changes, hyperthyroidism, anxiety,   Assessment / Plan / Recommendation Clinical Impression  Patient presents with a mod oropharyngeal dyspahgia which is significantly impacted by her current poor cognitive status. Patient is anxious, speaking in very soft almost inaudible voice, keeps head turned to left, eyes almost fully closed and no eye contact with SLP. She was able to drink sips of water (thin liquids) with mod-max cues for oral preparatory phase for use of straw. No overt s/s of aspiration or penetration observed. Patient consumed 2-3 1/2 teaspoon bites of puree (applesauce) with observed delayed anterior to posterior transit of bolus and suspected delay in swallow initiation.    SLP Assessment  SLP Visit Diagnosis: Dysphagia, unspecified (R13.10)    Follow Up Recommendations  Other (comment) (TBD\)    Frequency and Duration min 2x/week         SLP Evaluation Cognition  Overall Cognitive Status: Impaired/Different  from baseline Orientation Level: (P) Oriented to person;Disoriented to place;Disoriented to time;Disoriented to situation       Comprehension       Expression Written Expression Dominant Hand: Right   Oral / Motor  Oral Motor/Sensory Function Overall  Oral Motor/Sensory Function: Moderate impairment Facial ROM: Reduced right;Reduced left Facial Symmetry: Within Functional Limits Facial Strength: Within Functional Limits Lingual ROM: Reduced right;Reduced left Lingual Symmetry: Within Functional Limits Lingual Strength: Reduced Velum: Within Functional Limits Mandible: Within Functional Limits   GO          Sonia Baller, MA, [place] [facility]

## 2019-10-21 NOTE — Progress Notes (Signed)
Physical Therapy Treatment Patient Details Name: Hannah Padilla MRN: [id number] DOB: [birth year] Today's Date: 10/21/2019    History of Present Illness 69 yo female with onset of Parkinson's like symptoms was admitted to ED, with recurrent changes of metabolic encephalopathy, AMS, panic attacks, and tremors. Pt is rapidly declining in her mobility to approach dependent care.  Neurologists are working to diagnose her condition. PMHx:  Rocky mountain spotted fever, UTI, dementia, incontinence, colon polyp, mood changes, hyperthyroidism, anxiety,     PT Comments    Pt with slow progression towards goals. Increased tremors and requiring max A +2 to perform bed mobility tasks and to stand at EOB. Pt with posterior bias initially and requiring assist to correct. Feel current recommendations appropriate. Will continue to follow acutely.    Follow Up Recommendations  SNF     Equipment Recommendations  None recommended by PT    Recommendations for Other Services Speech consult     Precautions / Restrictions Precautions Precautions: Fall Precaution Comments: Tremors noted in all extremities Restrictions Weight Bearing Restrictions: No    Mobility  Bed Mobility Overal bed mobility: Needs Assistance Bed Mobility: Supine to Sit;Sit to Supine     Supine to sit: Max assist;+2 for physical assistance Sit to supine: Max assist;+2 for physical assistance   General bed mobility comments: Required assist for trunk and LE throughout. Pt requiring cues for sequencing. Required assist to scoot hips towards EOB.   Transfers Overall transfer level: Needs assistance Equipment used: 2 person hand held assist Transfers: Sit to/from Stand Sit to Stand: Max assist;+2 physical assistance         General transfer comment: Pt with increased tremors in standing. Reports she felt like she was falling. MAx +2 for lift assist and steadying to stand.   Ambulation/Gait                 Stairs              Wheelchair Mobility    Modified Rankin (Stroke Patients Only)       Balance                                            Cognition Arousal/Alertness: Awake/alert Behavior During Therapy: Flat affect Overall Cognitive Status: Impaired/Different from baseline Area of Impairment: Problem solving;Awareness;Safety/judgement;Following commands;Memory;Attention;Orientation                 Orientation Level: Disoriented to;Place;Time;Situation Current Attention Level: Sustained Memory: Decreased recall of precautions;Decreased short-term memory Following Commands: Follows one step commands with increased time Safety/Judgement: Decreased awareness of safety;Decreased awareness of deficits Awareness: Intellectual Problem Solving: Slow processing;Decreased initiation;Difficulty sequencing;Requires verbal cues;Requires tactile cues General Comments: Could only report that her birthday was on the third, but could not tell the date. Had difficulty sequencing.       Exercises      General Comments General comments (skin integrity, edema, etc.): Pt's friend present and very tearful during session. Reports pt has declined very rapidly over the past week.       Pertinent Vitals/Pain Pain Assessment: Faces Faces Pain Scale: No hurt    Home Living                      Prior Function            PT Goals (current goals can now be found  in the care plan section) Acute Rehab PT Goals Patient Stated Goal: none stated PT Goal Formulation: With family Time For Goal Achievement: 11/03/19 Potential to Achieve Goals: Good Progress towards PT goals: Progressing toward goals    Frequency    Min 2X/week      PT Plan Current plan remains appropriate    Co-evaluation PT/OT/SLP Co-Evaluation/Treatment: Yes Reason for Co-Treatment: To address functional/ADL transfers;For patient/therapist safety;Complexity of the patient's impairments  (multi-system involvement) PT goals addressed during session: Mobility/safety with mobility;Balance        AM-PAC PT "6 Clicks" Mobility   Outcome Measure  Help needed turning from your back to your side while in a flat bed without using bedrails?: A Lot Help needed moving from lying on your back to sitting on the side of a flat bed without using bedrails?: Total Help needed moving to and from a bed to a chair (including a wheelchair)?: Total Help needed standing up from a chair using your arms (e.g., wheelchair or bedside chair)?: Total Help needed to walk in hospital room?: Total Help needed climbing 3-5 steps with a railing? : Total 6 Click Score: 7    End of Session Equipment Utilized During Treatment: Gait belt Activity Tolerance: Patient tolerated treatment well Patient left: in bed;with call bell/phone within reach;with family/visitor present (in ED ) Nurse Communication: Mobility status;Other (comment) (pt choking on drink ) PT Visit Diagnosis: Unsteadiness on feet (R26.81);Muscle weakness (generalized) (M62.81);History of falling (Z91.81);Other abnormalities of gait and mobility (R26.89);Other symptoms and signs involving the nervous system (R29.898)     Time: 1751-0258 PT Time Calculation (min) (ACUTE ONLY): 27 min  Charges:  $Therapeutic Activity: 8-22 mins                     Lou Miner, DPT  Acute Rehabilitation Services  Pager: [phone] Office: [phone]    Rudean Hitt 10/21/2019, 1:25 PM

## 2019-10-21 NOTE — ED Notes (Signed)
Physical Therapy at Bedside working with Pt

## 2019-10-22 ENCOUNTER — Telehealth: Payer: Self-pay | Admitting: Neurology

## 2019-10-22 MED ORDER — DIAZEPAM 5 MG PO TABS
2.5000 mg | ORAL_TABLET | Freq: Two times a day (BID) | ORAL | Status: DC
  Administered 2019-10-22: 2.5 mg via ORAL
  Filled 2019-10-22: qty 1

## 2019-10-22 NOTE — Progress Notes (Signed)
Notified Charge and MD on call that the patient is doing fine and will do VS q1h for 4 hrs and will do routine after. Family at bedside and also aware.

## 2019-10-22 NOTE — Plan of Care (Signed)

## 2019-10-22 NOTE — Telephone Encounter (Signed)
I called the husband.  So far, spinal fluid analysis has been completely normal.  The 14-3-3 protein is pending, but this may take several weeks to come back.  The patient is being treated with high-dose steroids which I agree with completely.  An autoimmune etiology has not been fully eliminated even though the antibody panels have been negative.  Autoimmune encephalitis oftentimes starts with emotional instability and anxiety, similar to what was noted in this case.

## 2019-10-22 NOTE — Progress Notes (Signed)
Occupational Therapy Treatment Patient Details Name: Hannah Padilla MRN: [id number] DOB: 03-09-1950 Today's Date: 10/22/2019    History of present illness 69 yo female with onset of Parkinson's like symptoms was admitted to ED, with recurrent changes of metabolic encephalopathy, AMS, panic attacks, and tremors. Pt is rapidly declining in her mobility to approach dependent care.  Neurologists are working to diagnose her condition. PMHx:  Rocky mountain spotted fever, UTI, dementia, incontinence, colon polyp, mood changes, hyperthyroidism, anxiety,    OT comments  Pt seen for additional OT session for issuance of palm protectors. Pt has tendency to clench fists and has long nails that compromise skin integrity. Educated Conservation officer, historic buildings and family on applying palm protectors as needed when pt is noted to be clenching fists. Sign placed above pts bed for staff carry over. Will continue to follow per POC below.   Follow Up Recommendations  SNF;Supervision/Assistance - 24 hour    Equipment Recommendations  None recommended by OT    Recommendations for Other Services      Precautions / Restrictions Precautions Precautions: Fall Precaution Comments: Tremors noted in all extremities       Mobility Bed Mobility               General bed mobility comments: max A to reposition in bed  Transfers                      Balance                                           ADL either performed or assessed with clinical judgement   ADL Overall ADL's : Needs assistance/impaired                                     Functional mobility during ADLs: Maximal assistance;+2 for physical assistance General ADL Comments: total A for ADL tasks     Vision   Vision Assessment?: Vision impaired- to be further tested in functional context   Perception     Praxis      Cognition Arousal/Alertness: Awake/alert Behavior During Therapy: Anxious;Restless;Flat  affect Overall Cognitive Status: Impaired/Different from baseline Area of Impairment: Orientation;Attention;Memory;Following commands;Safety/judgement;Awareness;Problem solving                 Orientation Level: Place;Time;Situation Current Attention Level: Focused Memory: Decreased recall of precautions;Decreased short-term memory Following Commands: Follows one step commands inconsistently Safety/Judgement: Decreased awareness of safety;Decreased awareness of deficits Awareness: Intellectual Problem Solving: Slow processing;Decreased initiation;Difficulty sequencing;Requires verbal cues;Requires tactile cues General Comments: perseverating on family's names and words said around her that she would hear and repeat        Exercises     Shoulder Instructions       General Comments      Pertinent Vitals/ Pain       Pain Assessment: No/denies pain  Home Living                                          Prior Functioning/Environment              Frequency  Min 2X/week        Progress Toward Goals  OT Goals(current goals can now be found in the care plan section)  Progress towards OT goals: Progressing toward goals  Acute Rehab OT Goals Patient Stated Goal: per friend to find out what is wrong OT Goal Formulation: With patient Time For Goal Achievement: 11/04/19 Potential to Achieve Goals: [place] Discharge plan remains appropriate    Co-evaluation                 AM-PAC OT "6 Clicks" Daily Activity     Outcome Measure   Help from another person eating meals?: Total Help from another person taking care of personal grooming?: Total Help from another person toileting, which includes using toliet, bedpan, or urinal?: Total Help from another person bathing (including washing, rinsing, drying)?: Total Help from another person to put on and taking off regular upper body clothing?: Total Help from another person to put on and  taking off regular lower body clothing?: Total 6 Click Score: 6    End of Session    OT Visit Diagnosis: Unsteadiness on feet (R26.81);Other abnormalities of gait and mobility (R26.89);Muscle weakness (generalized) (M62.81);Other symptoms and signs involving cognitive function   Activity Tolerance Patient tolerated treatment well   Patient Left in bed;with family/visitor present   Nurse Communication Mobility status        Time: 2500-3704 OT Time Calculation (min): 14 min  Charges: OT General Charges $OT Visit: 1 Visit OT Treatments $Self Care/Home Management : 8-22 mins  Zenovia Jarred, MSOT, OTR/L [place] [facility] Office Number: [phone] Pager: [phone]  Zenovia Jarred 10/22/2019, 4:58 PM

## 2019-10-22 NOTE — Progress Notes (Signed)
Subjective: No significant changes  Exam: Vitals:   10/22/19 1246 10/22/19 1529  BP:  101/66  Pulse:  67  Resp:  17  Temp: (!) 97.4 F (36.3 C) 98.6 F (37 C)  SpO2:  92%   Gen: In bed, NAD Resp: non-labored breathing, no acute distress Abd: soft, nt  Neuro: MS: Awake, alert, interactive, follows commands CN: Extraocular movements intact Motor: She has significant tremor throughout, slightly worse than yesterday Sensory: Intact light touch  Pertinent Labs: Free T4 1.96 Paraneoplastic CSF study - negative  Impression: 69 year old female with rapidly progressive dementia of unclear etiology.  Everything, her hyperreflexia, her tremor, trouble walking all appears relatively global, without focality.  CJD continues to be a possibility as does autoimmune encephalopathy.  Hypothyroidism can cause some of the symptoms described and she is being treated for this as well.  Given that this could be autoimmune even if her paraneoplastic panel is negative, I would still favor going ahead and starting empiric therapy with steroids to assess for response.  If she does not respond to steroids, then I do not think pursue further treatment without more definitive diagnosis.  Recommendations: 1) Solu-Medrol 1 g daily day 3/5 2) 24-hour urine for heavy metals, porphobilinogen, delta alae pending 3) Serum for anti-LGI1 4) neurology will follow  Roland Rack, MD [facility] [phone]  If 7pm- 7am, please page neurology on call as listed in [place].

## 2019-10-22 NOTE — NC FL2 (Signed)
[place] LEVEL OF CARE SCREENING TOOL     IDENTIFICATION  Patient Name: Hannah Padilla Birthdate: 1950/05/31 Sex: female Admission Date (Current Location): 10/20/2019  [facility] and [place] Number:  Herbalist and Address:  [facility], [place] [address] [place], [place] [address]      Provider Number: [id number]  Attending Physician Name and Address:  Mendel Corning, MD  Relative Name and Phone Number:  Teairra, Millar [phone]    Current Level of Care: Hospital Recommended Level of Care: [place] Prior Approval Number:    Date Approved/Denied:   PASRR Number: [id number]  Discharge Plan: SNF    Current Diagnoses: Patient Active Problem List   Diagnosis Date Noted  . Acute metabolic encephalopathy 65/04/5463  . Hallucination 10/05/2019  . Dehydration 10/05/2019  . Dysphagia 10/05/2019  . Incontinence of feces 09/29/2019  . Urinary incontinence 09/29/2019  . Adrenal mass ([place]) 09/02/2019  . Decrease in appetite 09/02/2019  . Weight loss 09/02/2019  . Acute bilateral thoracic back pain 09/02/2019  . Metabolic encephalopathy 68/01/7516  . Subclinical hyperthyroidism 08/19/2019  . Panic attack 08/18/2019  . Urinary tract infection without hematuria 08/18/2019  . UTI (urinary tract infection) 08/12/2019  . Altered mental state 08/12/2019  . Grief 08/12/2019  . RMSF [facility] spotted fever) 07/27/2019  . Anxiety 07/21/2019  . Transaminitis 07/21/2019  . Abnormal thyroid blood test 07/21/2019  . Sleep disturbance 07/21/2019  . Hypokalemia 07/21/2019  . Leukocytosis 07/21/2019  . Tremor 07/21/2019  . Vitamin D deficiency 07/21/2019  . Palpitation 07/21/2019  . Vaginal bleeding 07/08/2018  . Encounter for hepatitis C screening test for low risk patient 07/08/2018  . Chronic nausea 07/08/2018  . Screening for cervical cancer 07/08/2018  . Vaginal mass 07/08/2018  . Gastroesophageal reflux disease  07/08/2018  . Vaccine counseling 07/08/2018  . Estrogen deficiency 07/08/2018  . Post-menopausal 07/08/2018  . Need for pneumococcal vaccination 07/08/2018    Orientation RESPIRATION BLADDER Height & Weight     Self, Time, Situation  Normal External catheter Weight:   Height:     BEHAVIORAL SYMPTOMS/MOOD NEUROLOGICAL BOWEL NUTRITION STATUS      Continent Diet (See discharge summary)  AMBULATORY STATUS COMMUNICATION OF NEEDS Skin   Extensive Assist Verbally Other (Comment) (Blister on L buttock)                       Personal Care Assistance Level of Assistance  Bathing, Feeding, Dressing Bathing Assistance: Maximum assistance Feeding assistance: Limited assistance Dressing Assistance: Maximum assistance     Functional Limitations Info  Sight, Hearing, Speech Sight Info: Impaired (Nearsighted, glasses) Hearing Info: Adequate Speech Info: Adequate    SPECIAL CARE FACTORS FREQUENCY  PT (By licensed PT), OT (By licensed OT), Speech therapy     PT Frequency: 5x week OT Frequency: 5x week     Speech Therapy Frequency: 2x week      Contractures Contractures Info: Not present    Additional Factors Info  Code Status, Allergies, Psychotropic Code Status Info: Full Allergies Info: NKA Psychotropic Info: Sertraline         Current Medications (10/22/2019):  This is the current hospital active medication list Current Facility-Administered Medications  Medication Dose Route Frequency Provider Last Rate Last Admin  . acetaminophen (TYLENOL) tablet 650 mg  650 mg Oral Q6H PRN Fuller Plan A, MD       Or  . acetaminophen (TYLENOL) suppository 650 mg  650 mg Rectal Q6H PRN Fuller Plan A, MD      . albuterol (PROVENTIL) (2.5 MG/3ML) 0.083% nebulizer solution 2.5 mg  2.5 mg Nebulization Q6H PRN Chasin Findling, Rondell A, MD      . bisacodyl (DULCOLAX) EC tablet 10 mg  10 mg Oral Daily PRN Jisel Fleet, Rondell A, MD      . diazepam (VALIUM) tablet 2.5 mg  2.5 mg Oral BID Rai,  Ripudeep K, MD      . enoxaparin (LOVENOX) injection 40 mg  40 mg Subcutaneous Q24H Eathen Budreau, Rondell A, MD   40 mg at 10/21/19 1730  . methimazole (TAPAZOLE) tablet 5 mg  5 mg Oral TID Fuller Plan A, MD   5 mg at 10/22/19 1217  . methylPREDNISolone sodium succinate (SOLU-MEDROL) 1,000 mg in sodium chloride 0.9 % 50 mL IVPB  1,000 mg Intravenous Daily Greta Doom, MD   Stopped at 10/21/19 1305  . ondansetron (ZOFRAN) tablet 4 mg  4 mg Oral Q6H PRN Fuller Plan A, MD       Or  . ondansetron (ZOFRAN) injection 4 mg  4 mg Intravenous Q6H PRN Kismet Facemire, Rondell A, MD      . pantoprazole (PROTONIX) EC tablet 40 mg  40 mg Oral Daily Tamala Julian, Rondell A, MD   40 mg at 10/22/19 1216  . propranolol ER (INDERAL LA) 24 hr capsule 60 mg  60 mg Oral Daily Tamala Julian, Rondell A, MD   60 mg at 10/22/19 1217  . sertraline (ZOLOFT) tablet 50 mg  50 mg Oral Daily Fuller Plan A, MD   50 mg at 10/22/19 1217  . sodium chloride flush (NS) 0.9 % injection 3 mL  3 mL Intravenous Q12H Fuller Plan A, MD   3 mL at 10/21/19 2150     Discharge Medications: Please see discharge summary for a list of discharge medications.  Relevant Imaging Results:  Relevant Lab Results:   Additional Information SS# [id number] [place]CS[place]

## 2019-10-22 NOTE — Progress Notes (Signed)
  Speech Language Pathology Treatment: Dysphagia  Patient Details Name: Hannah Padilla MRN: [id number] DOB: 08/25/1950 Today's Date: 10/22/2019 Time: 3734-2876 SLP Time Calculation (min) (ACUTE ONLY): 20 min  Assessment / Plan / Recommendation Clinical Impression  Followed up for diet advancement, pts family at bedside. Pt alert, continues to have confusion. Pt exhibited oral acceptance of POs: thins, regular, & soft solid. Prolonged mastication of solids, intermittent oral holding, and delayed oral transit noted. Adequate oral clearance was achieved with extended time. Once instance of overt cough exhibited with initial sip of thins via straw. Consecutive swallows of thins via cup and and straw were without overt s/sx of aspiration. Recommend diet advancement to dysphagia 3 (mechanical soft) and thin liquids with medicines as tolerated. Discussed pts waxing and waning of mentation and cognitive impact on swallow function with likelihood of picking food items as pts mentation tolerates. Family in agreement with plan.    HPI HPI: 69 yo female with onset of Parkinson's like symptoms was admitted to ED, with recurrent changes of metabolic encephalopathy, AMS, panic attacks, and tremors. Pt is rapidly declining in her mobility to approach dependent care.  Neurologists are working to diagnose her condition. PMHx:  Rocky mountain spotted fever, UTI, dementia, incontinence, colon polyp, mood changes, hyperthyroidism, anxiety,      SLP Plan  Continue with current plan of care       Recommendations  Diet recommendations: Dysphagia 3 (mechanical soft);Thin liquid Liquids provided via: Cup;Straw Medication Administration: Other (Comment) (as tolerated) Supervision: Full supervision/cueing for compensatory strategies;Staff to assist with self feeding Compensations: Minimize environmental distractions;Slow rate;Small sips/bites;Follow solids with liquid Postural Changes and/or Swallow Maneuvers: Seated  upright 90 degrees;Upright 30-60 min after meal                Oral Care Recommendations: Oral care BID Follow up Recommendations: 24 hour supervision/assistance SLP Visit Diagnosis: Dysphagia, unspecified (R13.10) Plan: Continue with current plan of care       Gardner MA, CCC-SLP Acute Rehabilitation Services 10/22/2019, 1:20 PM

## 2019-10-22 NOTE — TOC Initial Note (Signed)
Transition of Care [facility]) - Initial/Assessment Note    Patient Details  Name: Hannah Padilla MRN: [id number] Date of Birth: [birth year]  Transition of Care [facility]) CM/SW Contact:    Coralee Pesa, [place] Phone Number: 10/22/2019, 12:00 PM  Clinical Narrative:                 CSW spoke with pt, spouse at bedside. Pt inconsistent in conversation, but agreed with spouse on decision making. Options for after Discharge were discussed an spouse felt that SNF would be appropriate. He noted that he has heard of [facility] and would like to prioritize them, but was agreeable to a further faxout for options. Spouse, [name], had concerns that pt would be discharged home instead of SNF or that she would be dc to soon. Questions were answered and support given. Pt has had both Moderna Covid shots. While they live in [facility], they would prefer a facility in [place] so they can be close to family. FL2 and faxout will be completed. CSW will continue to follow for discharge planning.  Expected Discharge Plan: Skilled Nursing Facility Barriers to Discharge: Continued Medical Work up, SNF Pending bed offer   Patient Goals and CMS Choice Patient states their goals for this hospitalization and ongoing recovery are:: Pt unable to participate in goal setting due to disorientation, Spouse agreeable to SNF. CMS Medicare.gov Compare Post Acute Care list provided to:: Patient Represenative (must comment) (Spouse, [name].) Choice offered to / list presented to : Spouse  Expected Discharge Plan and Services Expected Discharge Plan: [place] In-house Referral: Clinical Social Work     Living arrangements for the past 2 months: Single Family Home                                      Prior Living Arrangements/Services Living arrangements for the past 2 months: Single Family Home Lives with:: Spouse Patient language and need for interpreter reviewed:: Yes        Need for Family Participation  in Patient Care: Yes (Comment) Care giver support system in place?: Yes (comment)   Criminal Activity/Legal Involvement Pertinent to Current Situation/Hospitalization: No - Comment as needed  Activities of Daily Living      Permission Sought/Granted Permission sought to share information with : Family Supports    Share Information with NAME: Otto Caraway     Permission granted to share info w Relationship: spouse  Permission granted to share info w Contact Information: [phone]  Emotional Assessment Appearance:: Appears stated age Attitude/Demeanor/Rapport: Sedated Affect (typically observed): Pleasant Orientation: : Oriented to Self, Oriented to Place, Oriented to  Time Alcohol / Substance Use: Not Applicable Psych Involvement: No (comment)  Admission diagnosis:  Metabolic encephalopathy [F09.32] Encephalopathy [T55.73] Acute metabolic encephalopathy [U20.25] Patient Active Problem List   Diagnosis Date Noted  . Acute metabolic encephalopathy 42/70/6237  . Hallucination 10/05/2019  . Dehydration 10/05/2019  . Dysphagia 10/05/2019  . Incontinence of feces 09/29/2019  . Urinary incontinence 09/29/2019  . Adrenal mass ([place]) 09/02/2019  . Decrease in appetite 09/02/2019  . Weight loss 09/02/2019  . Acute bilateral thoracic back pain 09/02/2019  . Metabolic encephalopathy 62/83/1517  . Subclinical hyperthyroidism 08/19/2019  . Panic attack 08/18/2019  . Urinary tract infection without hematuria 08/18/2019  . UTI (urinary tract infection) 08/12/2019  . Altered mental state 08/12/2019  . Grief 08/12/2019  . RMSF [facility] fever) 07/27/2019  .  Anxiety 07/21/2019  . Transaminitis 07/21/2019  . Abnormal thyroid blood test 07/21/2019  . Sleep disturbance 07/21/2019  . Hypokalemia 07/21/2019  . Leukocytosis 07/21/2019  . Tremor 07/21/2019  . Vitamin D deficiency 07/21/2019  . Palpitation 07/21/2019  . Vaginal bleeding 07/08/2018  . Encounter for  hepatitis C screening test for low risk patient 07/08/2018  . Chronic nausea 07/08/2018  . Screening for cervical cancer 07/08/2018  . Vaginal mass 07/08/2018  . Gastroesophageal reflux disease 07/08/2018  . Vaccine counseling 07/08/2018  . Estrogen deficiency 07/08/2018  . Post-menopausal 07/08/2018  . Need for pneumococcal vaccination 07/08/2018   PCP:  Carlena Hurl, PA-C Pharmacy:   [facility] [address], [place] - [address] [place] [address] Phone: [phone] Fax: [phone]  [place], [place] [address] [place] [address] Phone: [phone] Fax: [phone]     Social Determinants of Health (SDOH) Interventions    Readmission Risk Interventions No flowsheet data found.

## 2019-10-23 LAB — PARANEOPLASTIC ANTIBODY EVALUATION WITH REFL TO TITR/LB/BASIC/CSF
AGNA/SOX1 Ab, IFA: NEGATIVE
ANNA1 (Hu) Ab, IFA: NEGATIVE
ANNA2 (Ri) Ab, IFA: NEGATIVE
ANNA3 Ab, IFA: NEGATIVE
AQUAPORIN 4 AB, IFA, CSF: NEGATIVE
Amphiphysin Ab, IFA: NEGATIVE
CRMP5/CV2 Ab, IFA: NEGATIVE
GAD65 AB, IFA, CSF: NEGATIVE
PCA TR (DNER) AB, IFA,CSF: NEGATIVE
PCA1 (Yo) Ab, IFA: NEGATIVE
PCA2 Ab, IFA: NEGATIVE

## 2019-10-23 LAB — COMPREHENSIVE METABOLIC PANEL
ALT: 54 U/L — ABNORMAL HIGH (ref 0–44)
AST: 31 U/L (ref 15–41)
Albumin: 3.3 g/dL — ABNORMAL LOW (ref 3.5–5.0)
Alkaline Phosphatase: 50 U/L (ref 38–126)
Anion gap: 13 (ref 5–15)
BUN: 22 mg/dL (ref 8–23)
CO2: 20 mmol/L — ABNORMAL LOW (ref 22–32)
Calcium: 9.3 mg/dL (ref 8.9–10.3)
Chloride: 109 mmol/L (ref 98–111)
Creatinine, Ser: 0.7 mg/dL (ref 0.44–1.00)
GFR calc Af Amer: >60 mL/min (ref >60)
GFR calc non Af Amer: >60 mL/min (ref >60)
Glucose, Bld: 136 mg/dL — ABNORMAL HIGH (ref 70–99)
Potassium: 3.5 mmol/L (ref 3.5–5.1)
Sodium: 142 mmol/L (ref 135–145)
Total Bilirubin: 1.2 mg/dL (ref 0.3–1.2)
Total Protein: 5.4 g/dL — ABNORMAL LOW (ref 6.5–8.1)

## 2019-10-23 LAB — NMDA RECEPTOR AB

## 2019-10-23 LAB — GLUCOSE, CSF: Glucose, CSF: 56 mg/dL (ref 40–80)

## 2019-10-23 LAB — CSF CELL COUNT WITH DIFFERENTIAL
RBC Count, CSF: 29 cells/uL — ABNORMAL HIGH
WBC, CSF: 3 cells/uL (ref 0–5)

## 2019-10-23 LAB — OLIGOCLONAL BANDS, CSF + SERM

## 2019-10-23 LAB — NEURON SPECIFIC ENOLASE (NSE), CSF: Neuron Specific Enolase (NSE), CSF: 4.1 ng/mL (ref <8.9)

## 2019-10-23 LAB — 14-3-3 ETA PROTEIN: 14-3-3 eta Protein: <0.2 ng/mL (ref <0.2)

## 2019-10-23 LAB — PROTEIN, CSF: Total Protein, CSF: 38 mg/dL (ref 15–60)

## 2019-10-23 MED ORDER — DIAZEPAM 5 MG PO TABS
2.5000 mg | ORAL_TABLET | Freq: Two times a day (BID) | ORAL | Status: DC | PRN
  Administered 2019-10-23 – 2019-11-09 (×12): 2.5 mg via ORAL
  Filled 2019-10-23 (×13): qty 1

## 2019-10-23 NOTE — Progress Notes (Signed)
[facility]                                                                              Patient Demographics  Hannah Padilla, is a 69 y.o. female, DOB - 1950/05/19, JQG:[id number]  Admit date - 10/20/2019   Admitting Physician Norval Morton, MD  Outpatient Primary MD for the patient is Tysinger, Camelia Eng, PA-C  Outpatient specialists:   LOS - 2  days   Medical records reviewed and are as summarized below:    Chief Complaint  Patient presents with  . Altered Mental Status       Brief summary   Patient is a 69 year old female with history of subclinical hyperthyroidism, RMSF, UTIs presented with progressive decline over the last 3 months.  Prior to the onset of symptoms patient was healthy and able to complete all of her ADLs without any assistance.  Per son, her PCP had diagnosed her with hyperthyroidism, and was placed on methimazole and propranolol.  Reported that she had been having panic attacks, tremor, intermittent sweating, generalized weakness, falls, difficulty swallowing, hallucinations, insomnia and bowel and urine incontinence. Patient had extensive work-up including lumbar puncture, MRI, pheochromocytoma work-up, RMSF was positive.  Patient is currently followed outpatient by Dr. Jannifer Franklin.  She was placed on Valium which helped somewhat with the panic attacks Seroquel was attempted however patient became catatonic and this was continued after 2 doses. Patient was admitted for further work-up.  Assessment & Plan    Principal Problem: Acute metabolic encephalopathy with relatively rapid and progressive decline - significant neurological decline in the last 3 months.  Still unclear etiology. -Neurology following, started on high-dose steroids x 5 days, day #4/5 -Further work-up by neurology including heavy metals, repeat EEG, delta LA, paraneoplastic panel -EEG showed mild to moderate diffuse encephalopathy, nonspecific.  2 episodes of right hand tremor-like  movements were most likely nonepileptic -Per husband, has noticed slight improvement since starting steroids.  Active Problems: Subclinical hyperthyroidism -Does not fully explain patient's symptoms with progressive neuro decline.  Home regimen includes methimazole 5 mg 3 times daily.  Admitting physician, Dr. Fuller Plan discussed with Dr. Delrae Rend. -TSH 0.8, free T4 1.96 (improving from 2.33), total T4 10.5, T3 73    - Dr. Buddy Duty will be able to see her outpatient, will need ambulatory referral to Dr. Buddy Duty- endocrinology at the time of discharge -Continue methimazole and propranolol     Anxiety, insomnia -Per patient's husband at the bedside, Valium has helped at home, will continue 2.5 mg every 12 hours    Transaminitis -LFTs improving  Dysphagia SLP evaluation completed on 9/16, recommended dysphagia 3 diet with thin liquids  Code Status: Full code DVT Prophylaxis:  Lovenox Family Communication: Discussed all imaging results, lab results, explained to the patient's husband at the bedside    Disposition Plan:     Status is: Inpatient, IV treatments appropriate due to intensity of illness or inability to take PO and Inpatient level of care appropriate due to severity of illness  Dispo: The patient is from: Home              Anticipated d/c  is to: TBD              Anticipated d/c date is: 3 days              Patient currently is not medically stable to d/c.  Currently on high-dose IV steroids, very tremulous, acute encephalopathy.  Patient needs skilled nursing facility, patient's husband has been agreeable with the plan for rehab      Time Spent in minutes   35 minutes  Procedures:  EEG  Consultants:   Neurology  Antimicrobials:   Anti-infectives (From admission, onward)   None         Medications  Scheduled Meds: . diazepam  2.5 mg Oral BID  . enoxaparin (LOVENOX) injection  40 mg Subcutaneous Q24H  . methimazole  5 mg Oral TID  . pantoprazole  40 mg  Oral Daily  . propranolol ER  60 mg Oral Daily  . sertraline  50 mg Oral Daily  . sodium chloride flush  3 mL Intravenous Q12H   Continuous Infusions: . methylPREDNISolone (SOLU-MEDROL) injection 1,000 mg (10/23/19 1048)   PRN Meds:.acetaminophen **OR** acetaminophen, albuterol, bisacodyl, ondansetron **OR** ondansetron (ZOFRAN) IV      Subjective:   Hannah Padilla was seen and examined today.  Very sleepy but arousable.  Husband at the bedside feels that she is improving since starting steroids.  He was happy that she was able to sleep last night.  No acute fevers or chills, no nausea vomiting or diarrhea  Objective:   Vitals:   10/22/19 2346 10/23/19 0408 10/23/19 0745 10/23/19 1233  BP: 131/76 129/81 110/71 121/79  Pulse: 76 70 (!) 55 67  Resp: 18 17 20 16   Temp: 98.7 F (37.1 C) 98.1 F (36.7 C) 98.4 F (36.9 C) 98.2 F (36.8 C)  TempSrc: Oral Oral Axillary Axillary  SpO2: 96% 96% 97% 96%    Intake/Output Summary (Last 24 hours) at 10/23/2019 1234 Last data filed at 10/22/2019 1500 Gross per 24 hour  Intake 170 ml  Output 150 ml  Net 20 ml     Wt Readings from Last 3 Encounters:  10/08/19 64.9 kg  10/05/19 65.5 kg  09/29/19 66.2 kg   Physical Exam  General: Somnolent but arousable, oriented to self and person  Cardiovascular: S1 S2 clear, RRR. No pedal edema b/l  Respiratory: CTAB, no wheezing, rales or rhonchi  Gastrointestinal: Soft, nontender, nondistended, NBS  Ext: no pedal edema bilaterally  Neuro: tremors throughout, follows commands  Musculoskeletal: No cyanosis, clubbing  Skin: No rashes  Psych: somnolent but arousable     Data Reviewed:  I have personally reviewed following labs and imaging studies  Micro Results Recent Results (from the past 240 hour(s))  TIQ-MISC     Status: None   Collection Time: 10/15/19  8:54 AM  Result Value Ref Range Status   QUESTION/PROBLEM:   Final    Comment: . There is a question regarding the  following specimen submitted and/or the test requested. .    QUESTION: VERIFY TEST 14-3-3  Final    Comment: REQUESTED INFORMATION _________________________________ . AUTHORIZED SIGNATURE __________________________________ . TO PREVENT FURTHER DELAYS IN TESTING, PLEASE COMPLETE INFORMATION ABOVE AND EITHER FAX TO [phone] OR  EMAIL TO [email or web address] TO  RESOLVE THIS ORDER.   SARS Coronavirus 2 by RT PCR (hospital order, performed in [facility] hospital lab) Nasopharyngeal Nasopharyngeal Swab     Status: None   Collection Time: 10/20/19 10:24 AM   Specimen: Nasopharyngeal Swab  Result Value Ref  Range Status   SARS Coronavirus 2 NEGATIVE NEGATIVE Final    Comment: (NOTE) SARS-CoV-2 target nucleic acids are NOT DETECTED.  The SARS-CoV-2 RNA is generally detectable in upper and lower respiratory specimens during the acute phase of infection. The lowest concentration of SARS-CoV-2 viral copies this assay can detect is 250 copies / mL. A negative result does not preclude SARS-CoV-2 infection and should not be used as the sole basis for treatment or other patient management decisions.  A negative result may occur with improper specimen collection / handling, submission of specimen other than nasopharyngeal swab, presence of viral mutation(s) within the areas targeted by this assay, and inadequate number of viral copies (<250 copies / mL). A negative result must be combined with clinical observations, patient history, and epidemiological information.  Fact Sheet for Patients:   StrictlyIdeas.no  Fact Sheet for Healthcare Providers: BankingDealers.co.za  This test is not yet approved or  cleared by the [place] FDA and has been authorized for detection and/or diagnosis of SARS-CoV-2 by FDA under an Emergency Use Authorization (EUA).  This EUA will remain in effect (meaning this test can be used) for the  duration of the COVID-19 declaration under Section 564(b)(1) of the Act, 21 U.S.C. section 360bbb-3(b)(1), unless the authorization is terminated or revoked sooner.  Performed at [facility], [place] [address] [place], [place] [address]     Radiology Reports MR BRAIN W WO CONTRAST  Result Date: 10/11/2019  [facility] [address] [place], [place] [address] [phone] NEUROIMAGING REPORT STUDY DATE: 10/10/2019 PATIENT NAME: SHEVAWN LANGENBERG DOB: 01-30-1951 MRN: [id number] EXAM: MRI Brain with and without contrast ORDERING CLINICIAN: Kathrynn Ducking, MD CLINICAL HISTORY: 69 year old woman with encephalopathy COMPARISON FILMS: MRI 08/11/2019 TECHNIQUE:MRI of the brain with and without contrast was obtained utilizing 5 mm axial slices with T1, T2, T2 flair, SWI and diffusion weighted views.  T1 sagittal, T2 coronal and postcontrast views in the axial and coronal plane were obtained. CONTRAST: 12 ml [facility] IMAGING SITE: [facility], [place]. FINDINGS: On sagittal images, the spinal cord is imaged caudally to C3 and is normal in caliber.   The contents of the posterior fossa are of normal size and position.   The pituitary gland and optic chiasm appear normal.    There is mild generalized cortical atrophy.  Ventricles are not distorted.  There are no abnormal extra-axial collections of fluid.  In the hemispheres, there are some scattered T2/FLAIR hyperintense foci predominantly in the subcortical and deep white matter.  None of the foci appear to be acute.  They do not enhance there do not appear to be new lesions compared to the 08/11/2019 MRI.  The cerebellum and brainstem appears normal.   The deep gray matter appears normal.  Diffusion weighted images are normal.  Susceptibility weighted images are normal.   The orbits appear normal.   The VIIth/VIIIth nerve complex appears normal.  The mastoid air cells appear normal.  The paranasal sinuses appear normal.   Flow voids are identified within the major intracerebral arteries.  After the infusion of contrast material, a normal enhancement pattern is noted.   This MRI of the brain with and without contrast shows the following: 1.   Mild generalized cortical atrophy, stable compared to the 08/11/2019 MRI. 2.   Scattered T2/FLAIR hyperintense foci, predominantly in the subcortical and deep white matter of the hemispheres, most consistent with chronic microvascular ischemic change.  This is stable compared  to the 08/11/2019 MRI. 3.   Normal enhancement pattern no acute findings. INTERPRETING PHYSICIAN: Richard A. Felecia Shelling, MD, PhD, FAAN Certified in  Neuroimaging by [place] [employer] Neuro[employer]   EEG adult  Result Date: 10/21/2019 Lora Havens, MD     10/21/2019  7:50 AM Patient Name: TANISH SINKLER MRN: [id number] Epilepsy Attending: Lora Havens Referring Physician/Provider: Dr Roland Rack Date: 10/20/2019 Duration: 24.40mins Patient history: 69yo F with ams, hallucination, concern for rapidly progressing dementia. EEG to evaluate for seizure. Level of alertness: Awake AEDs during EEG study: None Technical aspects: This EEG study was done with scalp electrodes positioned according to the 10-20 International system of electrode placement. Electrical activity was acquired at a sampling rate of 500Hz  and reviewed with a high frequency filter of 70Hz  and a low frequency filter of 1Hz . EEG data were recorded continuously and digitally stored. Description: No clear posterior dominant rhythm was seen. EEG showed continuous generalized polymorphic 6-9Hz  theta-alpha activity. Two episodes of right hand tremor like movements were recorded. Concomitant eeg before, during and after the episodes didn't show eeg change to suggest seizure.   Hyperventilation and photic stimulation were not performed.   ABNORMALITY -Continuous slow, generalized IMPRESSION: This study is suggestive of mild to moderate diffuse encephalopathy,  nonspecific etiology. No seizures or epileptiform discharges were seen throughout the recording. Tow episodes of right hand tremor like movements were recorded without concomitant eeg change and were most likely not epileptic. However, focal motor seizures may not be seen on scalp eeg. Therefore, clinical correlation is recommended. Priyanka [name]   DG FL GUIDED LUMBAR PUNCTURE  Result Date: 10/15/2019 CLINICAL DATA:  Rapidly progressive dementia. EXAM: DIAGNOSTIC LUMBAR PUNCTURE UNDER FLUOROSCOPIC GUIDANCE FLUOROSCOPY TIME:  Fluoroscopy time: 14 seconds Radiation Exposure Index (if provided by the fluoroscopic device): 1.0 mGy Number of Acquired Spot Images: 0 PROCEDURE: Informed consent was obtained from the patient prior to the procedure, including potential complications of headache, allergy, and pain. With the patient left lateral decubitus, the lower back was prepped with Betadine. 1% Lidocaine was used for local anesthesia. Lumbar puncture was performed at the L3-L4 level via a left interlaminar approach using a 3.5 inch 20 gauge needle with return of clear, colorless CSF with an opening pressure of 14 cm water. 14 ml of CSF were obtained for laboratory studies. The patient tolerated the procedure well and there were no apparent complications. IMPRESSION: Technically successful fluoroscopically guided lumbar puncture. Electronically Signed   By: Titus Dubin M.D.   On: 10/15/2019 09:22    Lab Data:  CBC: Recent Labs  Lab 10/20/19 0553 10/21/19 0505  WBC 8.7 5.3  HGB 14.7 13.9  HCT 44.1 41.5  MCV 94.4 95.2  PLT 289 476   Basic Metabolic Panel: Recent Labs  Lab 10/20/19 0553 10/21/19 0505 10/23/19 0143  NA 140 140 142  K 3.4* 3.7 3.5  CL 108 111 109  CO2 22 19* 20*  GLUCOSE 102* 121* 136*  BUN 9 11 22   CREATININE 0.85 0.77 0.70  CALCIUM 9.9 8.6* 9.3   GFR: CrCl cannot be calculated (Unknown ideal weight.). Liver Function Tests: Recent Labs  Lab 10/20/19 0553  10/23/19 0143  AST 49* 31  ALT 50* 54*  ALKPHOS 59 50  BILITOT 1.1 1.2  PROT 6.4* 5.4*  ALBUMIN 4.0 3.3*   No results for input(s): LIPASE, AMYLASE in the last 168 hours. No results for input(s): AMMONIA in the last 168 hours. Coagulation Profile: No results for input(s): INR,  PROTIME in the last 168 hours. Cardiac Enzymes: No results for input(s): CKTOTAL, CKMB, CKMBINDEX, TROPONINI in the last 168 hours. BNP (last 3 results) No results for input(s): PROBNP in the last 8760 hours. HbA1C: No results for input(s): HGBA1C in the last 72 hours. CBG: Recent Labs  Lab 10/20/19 0528 10/20/19 0854  GLUCAP 110* 108*   Lipid Profile: No results for input(s): CHOL, HDL, LDLCALC, TRIG, CHOLHDL, LDLDIRECT in the last 72 hours. Thyroid Function Tests: Recent Labs    10/20/19 1812  TSH 0.851  T4TOTAL 10.5  FREET4 1.96*   Anemia Panel: No results for input(s): VITAMINB12, FOLATE, FERRITIN, TIBC, IRON, RETICCTPCT in the last 72 hours. Urine analysis:    Component Value Date/Time   COLORURINE YELLOW 10/20/2019 [place] 10/20/2019 1151   LABSPEC 1.012 10/20/2019 1151   LABSPEC 1.015 10/05/2019 0929   PHURINE 6.0 10/20/2019 1151   GLUCOSEU NEGATIVE 10/20/2019 1151   HGBUR NEGATIVE 10/20/2019 1151   BILIRUBINUR NEGATIVE 10/20/2019 1151   BILIRUBINUR negative 10/05/2019 0929   BILIRUBINUR NEG 11/26/2011 [place] 10/20/2019 1151   PROTEINUR NEGATIVE 10/20/2019 1151   UROBILINOGEN negative 11/26/2011 0931   UROBILINOGEN 0.2 03/25/2011 0633   NITRITE NEGATIVE 10/20/2019 1151   LEUKOCYTESUR NEGATIVE 10/20/2019 1151     Sharleen Szczesny M.D. [facility] 10/23/2019, 12:34 PM   Call night coverage person covering after 7pm

## 2019-10-23 NOTE — Plan of Care (Signed)
Pt alert and confused. Family at bedside. No issues over night. Problem: Education: Goal: Knowledge of General Education information will improve Description: Including pain rating scale, medication(s)/side effects and non-pharmacologic comfort measures Outcome: Progressing   Problem: Health Behavior/Discharge Planning: Goal: Ability to manage health-related needs will improve Outcome: Progressing   Problem: Clinical Measurements: Goal: Ability to maintain clinical measurements within normal limits will improve Outcome: Progressing Goal: Will remain free from infection Outcome: Progressing Goal: Diagnostic test results will improve Outcome: Progressing Goal: Respiratory complications will improve Outcome: Progressing Goal: Cardiovascular complication will be avoided Outcome: Progressing   Problem: Activity: Goal: Risk for activity intolerance will decrease Outcome: Progressing   Problem: Nutrition: Goal: Adequate nutrition will be maintained Outcome: Progressing   Problem: Coping: Goal: Level of anxiety will decrease Outcome: Progressing   Problem: Elimination: Goal: Will not experience complications related to bowel motility Outcome: Progressing Goal: Will not experience complications related to urinary retention Outcome: Progressing   Problem: Pain Managment: Goal: General experience of comfort will improve Outcome: Progressing   Problem: Safety: Goal: Ability to remain free from injury will improve Outcome: Progressing   Problem: Skin Integrity: Goal: Risk for impaired skin integrity will decrease Outcome: Progressing

## 2019-10-23 NOTE — TOC Progression Note (Signed)
Transition of Care [facility]) - Progression Note    Patient Details  Name: Hannah Padilla MRN: [id number] Date of Birth: Oct 09, 1950  Transition of Care [facility]) CM/SW [place], [place] Phone Number: 10/23/2019, 12:31 PM  Clinical Narrative:    CSW spoke with pt and spouse to discuss discharge planning. Bed offers were given, and husband is debating on facilities. Discharge is no longer imminent, but he will continue to look into options. Will reassess on Monday and update options as needed. CSW will continue to follow for discharge.   Expected Discharge Plan: [place] Barriers to Discharge: Continued Medical Work up, SNF Pending bed offer  Expected Discharge Plan and Services Expected Discharge Plan: [place] In-house Referral: Clinical Social Work     Living arrangements for the past 2 months: Single Family Home                                       Social Determinants of Health (SDOH) Interventions    Readmission Risk Interventions No flowsheet data found.

## 2019-10-24 DIAGNOSIS — G9340 Encephalopathy, unspecified: Secondary | ICD-10-CM

## 2019-10-24 NOTE — Progress Notes (Signed)
NEURO HOSPITALIST PROGRESS NOTE   Subjective: Today the patient is in bed and very drowsy. She had a family friend at bedside. According to her friend, the patient has been very confused and anxious both yesterday and today. Her friend said she spoke with [name], the patients husband and he also does not feel she has shown any improvement. She is having hallucinations and seeing a child in the room. She continues to have episodes of panic.   Exam: Vitals:   10/24/19 0735 10/24/19 0918  BP: 122/78 109/75  Pulse: 67 85  Resp: 16   Temp: 99 F (37.2 C)   SpO2: 96%     Physical Exam  Constitutional: Appears well-developed and well-nourished.  Psych: Confused Eyes: Normal external eye and conjunctiva. HENT: Normocephalic, no lesions, without obvious abnormality.   Musculoskeletal-no joint tenderness, deformity or swelling Cardiovascular: Normal rate and regular rhythm.  Respiratory: Effort normal, non-labored breathing saturations WNL GI: Soft.  No distension. There is no tenderness.  Skin: WDI  Neuro Exam  Mental Status: Today she is only oriented to self and that she is in a hospital in [place]. When asked the month or year, she would only answer with her birthday. She had great difficulty following simple verbal commands or to demonstrion, and required assistance to initiate lifting any of her extremities. Cranial Nerves: II:  Visual fields grossly normal,  III,IV, VI: ptosis not present, she looked forward and had great difficulty trying to get her to move her eyes to either side. She was not tracking myself, the nurse or her friend, as people moved around the room. Pupils round, reactive to light and accommodation V,VII: difficult to assess, she would not smile, no obvious facial droop present, facial light touch sensation normal bilaterally VIII: hearing normal bilaterally XII: midline tongue extension, tremor noted in tongue Motor: Right : Upper  extremity   4/5    Left:     Upper extremity   4/5  Lower extremity   4/5     Lower extremity   4/5 Tone and bulk:normal tone throughout; no atrophy noted Sensory: light touch intact throughout, bilaterally Cerebellar: Did not participate in normal finger-to-nose or heel-to-shin test. Tremor noted in bilateral upper extremities.   Medications: I have reviewed the patient's current medications.   Assessment:  69 year old female with rapidly progressive dementia of unclear etiology. Prior to 07/10/19 she was normal according to her husband. She was treated early June for [facility] Spotted Fever, then for a UTI when she returned to hospital with worsening anxiety and panic attacks. She returned mid June with worsening confusion, hallucinations and paranomia. EEG and MRI normal, and again treated for UTI. In August she developed shuffling gait, incontinence, changes in affect and worsening of her memory. She was seen in September by outpatient neurology and concern for CJD or autoimmune neurologic disorder. MMSE was 12/30 at that time. Repeat MRI stable and LP performed.    Presented this admission with worsening confusion, hallucinations and difficulty walking. Today she continues to be confused, anxious, having tremor, difficulty walking, difficulty following simple commands, is having hallucinations, is not initiating conversation and has very short responses. Exam is felt to be global without focality. Family friend reports that both she and her husband have not seen any improvement since starting steroids. EEG this admission showed no seizure activity.    Impression:   Rapidly  Progression Dementia differential:  CJD remains a strong possibility  Autoimmune encephalopathy was a possibility however she looks worse instead of better after several days of high dose steroids.    Hypothyroidism can cause some of the symptoms described and she is being treated for this as well  Recommendations:     CJD: remains very possible as the cause for her rapid neurologic decline    Autoimmune encephalopathy: possibilty now seems far less likely given that she has not improved on steroids, she actually looks worse. Dr. Leonel Ramsay does not feel it would be of benefit to continue steroids without a definitive diagnosis.    Hypothyroidism: Continue Treatment    24 hour urine: ordered on the admission 9/14, however was not completed. Spoke with nursing staff and they are going to get the collection container from lab and start 24 hour urine collection. ) 24-hour urine for heavy metals, porphobilinogen, delta alae pending   Anti-LGI1 Antibody Serum: Pending  Gwenyth Bouillon DNP, FNP-C  [facility] Nurse Practitioner  Attending Neurologist note review and further recommednations to follow.   10/24/2019, 10:29 AM

## 2019-10-24 NOTE — Progress Notes (Signed)
PROGRESS NOTE    Hannah Padilla  DVV:[id number] DOB: 1950/09/05 DOA: 10/20/2019 PCP: Carlena Hurl, PA-C   Brief Narrative:  Patient is a 69 year old female with history of subclinical hyperthyroidism, RMSF, UTIs presented with progressive decline over the last 3 months.  Prior to the onset of symptoms patient was healthy and able to complete all of her ADLs without any assistance.  Per son, her PCP had diagnosed her with hyperthyroidism, and was placed on methimazole and propranolol.  Reported that she had been having panic attacks, tremor, intermittent sweating, generalized weakness, falls, difficulty swallowing, hallucinations, insomnia and bowel and urine incontinence. Patient had extensive work-up including lumbar puncture, MRI, pheochromocytoma work-up, RMSF was positive.  Patient is currently followed outpatient by Dr. Jannifer Franklin.  She was placed on Valium which helped somewhat with the panic attacks,  Seroquel was attempted however patient became catatonic and this was continued after 2 doses. Patient was admitted for further work-up.  Neurology suspecting CJD remains very possible cause for her rapid neurological decline.  Ordered 24-hour urine collection for heavy metals,  Porphobilinogen, delta alae.   Assessment & Plan:   Principal Problem:   Metabolic encephalopathy Active Problems:   Anxiety   Transaminitis   Subclinical hyperthyroidism   Dysphagia   Acute metabolic encephalopathy   Acute metabolic encephalopathy with relatively rapid and progressive decline. - significant neurological decline in the last 3 months.  Still unclear etiology. -Neurology following, started on high-dose steroids x 5 days, ( day #5/5) -Further work-up by neurology including heavy metals, repeat EEG, delta LA, paraneoplastic panel  -EEG showed mild to moderate diffuse encephalopathy, nonspecific.    2 episodes of right hand tremor-like movements were most likely nonepileptic -There is no significant  improvement after completion of IV steroids. - Neurology suspecting CJD remains very possible cause for her rapid neurological decline   Subclinical hyperthyroidism -Does not fully explain patient's symptoms with progressive neuro decline.    Home regimen includes methimazole 5 mg 3 times daily.    Admitting physician, Dr. Fuller Plan discussed with Dr. Delrae Rend. -TSH 0.8, free T4 1.96 (improving from 2.33), total T4 10.5, T3 73    - Dr. Buddy Duty will be able to see her outpatient, will need ambulatory referral to Dr. Buddy Duty- endocrinology at the time of discharge -Continue methimazole and propranolol.     Anxiety, insomnia -Per patient's husband at the bedside, Valium has helped at home, will continue 2.5 mg every 12 hours    Transaminitis -LFTs improving  Dysphagia SLP evaluation completed on 9/16, recommended dysphagia 3 diet with thin liquids   DVT prophylaxis:  Lovenox. Code Status: Full Family Communication:  Friend was at bed side. Disposition Plan:  Status is: Inpatient  Remains inpatient appropriate because:Ongoing diagnostic testing needed not appropriate for outpatient work up   Dispo: The patient is from: Home              Anticipated d/c is to: TBD              Anticipated d/c date is: 3 days              Patient currently is not medically stable to d/c.   Consultants:   Neurology  Procedures:  EEG Antimicrobials: Anti-infectives (From admission, onward)   None     Subjective: Patient was seen and examined at bedside.  Overnight events noted.  She appears very confused and anxious.   She is alert, following commands.  She is very slow in  response.  Objective: Vitals:   10/24/19 0355 10/24/19 0735 10/24/19 0918 10/24/19 1115  BP: 122/79 122/78 109/75 (!) 144/90  Pulse: 62 67 85 80  Resp: 18 16  20   Temp: 98.1 F (36.7 C) 99 F (37.2 C)  98.7 F (37.1 C)  TempSrc: Oral Axillary  Axillary  SpO2: 97% 96%  100%    Intake/Output Summary  (Last 24 hours) at 10/24/2019 1434 Last data filed at 10/24/2019 1126 Gross per 24 hour  Intake --  Output 500 ml  Net -500 ml   There were no vitals filed for this visit.  Examination:  General exam: Appears calm and comfortable, confused, Anxious, slow in response. Respiratory system: Clear to auscultation. Respiratory effort normal. Cardiovascular system: S1 & S2 heard, RRR. No JVD, murmurs, rubs, gallops or clicks. No pedal edema. Gastrointestinal system: Abdomen is nondistended, soft and nontender. No organomegaly or masses felt.  Normal bowel sounds heard. Central nervous system: Alert and oriented. No focal neurological deficits. Extremities: No edema, No cyanosis, No clubbing Skin: No rashes, lesions or ulcers Psychiatry: Judgement and insight appear normal. Mood & affect appropriate.     Data Reviewed: I have personally reviewed following labs and imaging studies  CBC: Recent Labs  Lab 10/20/19 0553 10/21/19 0505  WBC 8.7 5.3  HGB 14.7 13.9  HCT 44.1 41.5  MCV 94.4 95.2  PLT 289 161   Basic Metabolic Panel: Recent Labs  Lab 10/20/19 0553 10/21/19 0505 10/23/19 0143  NA 140 140 142  K 3.4* 3.7 3.5  CL 108 111 109  CO2 22 19* 20*  GLUCOSE 102* 121* 136*  BUN 9 11 22   CREATININE 0.85 0.77 0.70  CALCIUM 9.9 8.6* 9.3   GFR: CrCl cannot be calculated (Unknown ideal weight.). Liver Function Tests: Recent Labs  Lab 10/20/19 0553 10/23/19 0143  AST 49* 31  ALT 50* 54*  ALKPHOS 59 50  BILITOT 1.1 1.2  PROT 6.4* 5.4*  ALBUMIN 4.0 3.3*   No results for input(s): LIPASE, AMYLASE in the last 168 hours. No results for input(s): AMMONIA in the last 168 hours. Coagulation Profile: No results for input(s): INR, PROTIME in the last 168 hours. Cardiac Enzymes: No results for input(s): CKTOTAL, CKMB, CKMBINDEX, TROPONINI in the last 168 hours. BNP (last 3 results) No results for input(s): PROBNP in the last 8760 hours. HbA1C: No results for input(s): HGBA1C  in the last 72 hours. CBG: Recent Labs  Lab 10/20/19 0528 10/20/19 0854  GLUCAP 110* 108*   Lipid Profile: No results for input(s): CHOL, HDL, LDLCALC, TRIG, CHOLHDL, LDLDIRECT in the last 72 hours. Thyroid Function Tests: No results for input(s): TSH, T4TOTAL, FREET4, T3FREE, THYROIDAB in the last 72 hours. Anemia Panel: No results for input(s): VITAMINB12, FOLATE, FERRITIN, TIBC, IRON, RETICCTPCT in the last 72 hours. Sepsis Labs: No results for input(s): PROCALCITON, LATICACIDVEN in the last 168 hours.  Recent Results (from the past 240 hour(s))  TIQ-MISC     Status: None   Collection Time: 10/15/19  8:54 AM  Result Value Ref Range Status   QUESTION/PROBLEM:   Final    Comment: . There is a question regarding the following specimen submitted and/or the test requested. .    QUESTION: VERIFY TEST 14-3-3  Final    Comment: REQUESTED INFORMATION _________________________________ . AUTHORIZED SIGNATURE __________________________________ . TO PREVENT FURTHER DELAYS IN TESTING, PLEASE COMPLETE INFORMATION ABOVE AND EITHER FAX TO [phone] OR  EMAIL TO [email or web address] TO  RESOLVE THIS ORDER.   SARS Coronavirus 2  by RT PCR (hospital order, performed in [facility] hospital lab) Nasopharyngeal Nasopharyngeal Swab     Status: None   Collection Time: 10/20/19 10:24 AM   Specimen: Nasopharyngeal Swab  Result Value Ref Range Status   SARS Coronavirus 2 NEGATIVE NEGATIVE Final    Comment: (NOTE) SARS-CoV-2 target nucleic acids are NOT DETECTED.  The SARS-CoV-2 RNA is generally detectable in upper and lower respiratory specimens during the acute phase of infection. The lowest concentration of SARS-CoV-2 viral copies this assay can detect is 250 copies / mL. A negative result does not preclude SARS-CoV-2 infection and should not be used as the sole basis for treatment or other patient management decisions.  A negative result may occur with improper specimen  collection / handling, submission of specimen other than nasopharyngeal swab, presence of viral mutation(s) within the areas targeted by this assay, and inadequate number of viral copies (<250 copies / mL). A negative result must be combined with clinical observations, patient history, and epidemiological information.  Fact Sheet for Patients:   StrictlyIdeas.no  Fact Sheet for Healthcare Providers: BankingDealers.co.za  This test is not yet approved or  cleared by the [place] FDA and has been authorized for detection and/or diagnosis of SARS-CoV-2 by FDA under an Emergency Use Authorization (EUA).  This EUA will remain in effect (meaning this test can be used) for the duration of the COVID-19 declaration under Section 564(b)(1) of the Act, 21 U.S.C. section 360bbb-3(b)(1), unless the authorization is terminated or revoked sooner.  Performed at [facility], [place] [address], [place], [place] [address]      Radiology Studies: No results found.  Scheduled Meds: . enoxaparin (LOVENOX) injection  40 mg Subcutaneous Q24H  . methimazole  5 mg Oral TID  . pantoprazole  40 mg Oral Daily  . propranolol ER  60 mg Oral Daily  . sertraline  50 mg Oral Daily  . sodium chloride flush  3 mL Intravenous Q12H   Continuous Infusions: . methylPREDNISolone (SOLU-MEDROL) injection 1,000 mg (10/24/19 1157)     LOS: 3 days    Time spent:  35 mins.    Shawna Clamp, MD [facility]   If 7PM-7AM, please contact night-coverage

## 2019-10-25 ENCOUNTER — Other Ambulatory Visit: Payer: Self-pay | Admitting: Neurology

## 2019-10-25 LAB — COMPREHENSIVE METABOLIC PANEL
ALT: 69 U/L — ABNORMAL HIGH (ref 0–44)
AST: 28 U/L (ref 15–41)
Albumin: 3.3 g/dL — ABNORMAL LOW (ref 3.5–5.0)
Alkaline Phosphatase: 53 U/L (ref 38–126)
Anion gap: 11 (ref 5–15)
BUN: 22 mg/dL (ref 8–23)
CO2: 20 mmol/L — ABNORMAL LOW (ref 22–32)
Calcium: 9.4 mg/dL (ref 8.9–10.3)
Chloride: 107 mmol/L (ref 98–111)
Creatinine, Ser: 0.71 mg/dL (ref 0.44–1.00)
GFR calc Af Amer: >60 mL/min (ref >60)
GFR calc non Af Amer: >60 mL/min (ref >60)
Glucose, Bld: 142 mg/dL — ABNORMAL HIGH (ref 70–99)
Potassium: 3.6 mmol/L (ref 3.5–5.1)
Sodium: 138 mmol/L (ref 135–145)
Total Bilirubin: 1.5 mg/dL — ABNORMAL HIGH (ref 0.3–1.2)
Total Protein: 5.8 g/dL — ABNORMAL LOW (ref 6.5–8.1)

## 2019-10-25 LAB — CBC
HCT: 46.5 % — ABNORMAL HIGH (ref 36.0–46.0)
Hemoglobin: 15.9 g/dL — ABNORMAL HIGH (ref 12.0–15.0)
MCH: 32.3 pg (ref 26.0–34.0)
MCHC: 34.2 g/dL (ref 30.0–36.0)
MCV: 94.3 fL (ref 80.0–100.0)
Platelets: 314 10*3/uL (ref 150–400)
RBC: 4.93 MIL/uL (ref 3.87–5.11)
RDW: 13.9 % (ref 11.5–15.5)
WBC: 10.6 10*3/uL — ABNORMAL HIGH (ref 4.0–10.5)
nRBC: 0 % (ref 0.0–0.2)

## 2019-10-25 LAB — MAGNESIUM: Magnesium: 2.5 mg/dL — ABNORMAL HIGH (ref 1.7–2.4)

## 2019-10-25 LAB — HIV ANTIBODY (ROUTINE TESTING W REFLEX): HIV Screen 4th Generation wRfx: NONREACTIVE

## 2019-10-25 LAB — PHOSPHORUS: Phosphorus: 2.6 mg/dL (ref 2.5–4.6)

## 2019-10-25 LAB — VITAMIN B12: Vitamin B-12: 124 pg/mL — ABNORMAL LOW (ref 180–914)

## 2019-10-25 MED ORDER — CYANOCOBALAMIN 1000 MCG/ML IJ SOLN
1000.0000 ug | Freq: Every day | INTRAMUSCULAR | Status: AC
  Administered 2019-10-25 – 2019-10-31 (×7): 1000 ug via INTRAMUSCULAR
  Filled 2019-10-25 (×8): qty 1

## 2019-10-25 MED ORDER — CARBIDOPA-LEVODOPA 25-100 MG PO TABS
1.0000 | ORAL_TABLET | Freq: Three times a day (TID) | ORAL | Status: DC
  Administered 2019-10-25 – 2019-11-09 (×43): 1 via ORAL
  Filled 2019-10-25 (×44): qty 1

## 2019-10-25 NOTE — Progress Notes (Addendum)
Subjective: No significant changes  Exam: Vitals:   10/25/19 0742 10/25/19 1009  BP: (!) 145/90 (!) 138/97  Pulse: 92 84  Resp: 18   Temp: 98.7 F (37.1 C)   SpO2: 96%    Gen: In bed, NAD Resp: non-labored breathing, no acute distress Abd: soft, nt  Neuro: MS: Awake, alert, interactive, follows commands CN: Extraocular movements intact Motor: She has resting tremor of the right hand, keeps both hands clenched. ? Mildly increased tone in the left arm with tremor vs cogwheeling. Lifts both legs.  Sensory: Intact light touch Hyperreflexia, 3+ thorughout including pectoral reflexes.   Pertinent Labs: TSH WNL Free T4 1.96(high) Ammonia 7/14 - 17 B1 - nml RMSF borderline positive IgG, negative IgM(she completed 10 day course of doxy in July) Lyme - negative ESR 2 Thyroglobulin and Thyroid peroxidase Ab - nml Anti-hu,yo,ri - negative By husbands report, 24 hour urine metanephrines "inconclusive"  CSF:  WBC 3 RBC 29 Protein 38 Glucose 56 OC Bands- negative Paraneoplastic CSF study - negative(did not include LGI1) Neuron Specific Enolase - negative.   14-3-3 eta - negative.   Impression: 69 year old female with rapidly progressive dementia of unclear etiology.  Everything, her hyperreflexia, her tremor, trouble walking(seems to be at least some component of gait apraxia) all appears relatively global, without focality.  CJD continues to be a possibility as does autoimmune encephalopathy.  Hyperthyroidism can cause some of the symptoms described and she is being treated for this as well.    Given that this could be autoimmune even if her paraneoplastic panel is negative, I started steroids but she has not had much response thus far.   The protein 14-3-3 isoform sent is more commonly used for rheumatic disease.  From my reading, it does appear that this isoform is usually present in CJD, but I think if there is still CSF to send, I would favor sending it to [facility] for a  more comprehensive panel including CSF tau.   The progression seems too fast for corticobasal syndrome or multiple system atrophy, though CBS may still be a possibility.   Recommendations: 1) Solu-Medrol completed 9/18 2) 24-hour urine for heavy metals, porphobilinogen, delta alae pending 3) Serum for anti-LGI1 pending 4) Send serum paraneoplastic panel 5) Would reach out to Dr. Jannifer Franklin this week to try to add Tau/14-3-3 panel to CSF already collected.  6) Continue treatment for hyperthyroidism.   Roland Rack, MD [facility] [phone]  If 7pm- 7am, please page neurology on call as listed in [place].

## 2019-10-25 NOTE — Progress Notes (Addendum)
PROGRESS NOTE    Hannah Padilla  ZYS:[id number] DOB: [birth year] DOA: 10/20/2019 PCP: Carlena Hurl, PA-C   Brief Narrative:  Patient is a 69 year old female with history of subclinical hyperthyroidism, RMSF, UTIs presented with progressive decline over the last 3 months.  Prior to the onset of symptoms patient was healthy and able to complete all of her ADLs without any assistance.  Per son, her PCP had diagnosed her with hyperthyroidism, and was placed on methimazole and propranolol.  Reported that she had been having panic attacks, tremor, intermittent sweating, generalized weakness, falls, difficulty swallowing, hallucinations, insomnia and bowel and urine incontinence. Patient had extensive work-up including lumbar puncture, MRI, pheochromocytoma work-up, RMSF was positive.  Patient is currently followed outpatient by Dr. Jannifer Franklin.  She was placed on Valium which helped somewhat with the panic attacks,  Seroquel was attempted however patient became catatonic and this was discontinued after 2 doses. Patient was admitted for further work-up.  Neurology suspecting CJD remains very possible cause for her rapid neurological decline.  Ordered 24-hour urine collection for heavy metals,  Porphobilinogen, delta alae.   Assessment & Plan:   Principal Problem:   Metabolic encephalopathy Active Problems:   Anxiety   Transaminitis   Subclinical hyperthyroidism   Dysphagia   Acute metabolic encephalopathy   Acute metabolic encephalopathy with relatively rapid and progressive decline. - significant neurological decline in the last 3 months.  Still unclear etiology. -Neurology following, started on high-dose steroids x 5 days. Completed 10/24/19. -Further work-up by neurology including heavy metals, repeat EEG, delta LA, paraneoplastic panel  -EEG showed mild to moderate diffuse encephalopathy, nonspecific.    - 2 episodes of right hand tremor-like movements were most likely nonepileptic -There is no  significant improvement after completion of IV steroids. - Neurology suspecting CJD remains very possible cause for her rapid neurological decline.   Subclinical hyperthyroidism -Does not fully explain patient's symptoms with progressive neuro decline.    Home regimen includes methimazole 5 mg 3 times daily.    Admitting physician, Dr. Fuller Plan discussed with Dr. Delrae Rend. -TSH 0.8, free T4 1.96 (improving from 2.33), total T4 10.5, T3 73    - Dr. Buddy Duty will be able to see her outpatient, will need ambulatory referral to Dr. Buddy Duty- endocrinology at the time of discharge -Continue methimazole and propranolol.   Anxiety, insomnia -Per patient's husband at the bedside, Valium has helped at home, will continue 2.5 mg every 12 hours.   Transaminitis -LFTs improving.  Dysphagia SLP evaluation completed on 9/16, recommended dysphagia 3 diet with thin liquids   DVT prophylaxis:  Lovenox. Code Status: Full Family Communication:  Husband at bed side. All questions answered. Disposition Plan:  Status is: Inpatient  Remains inpatient appropriate because:Ongoing diagnostic testing needed not appropriate for outpatient work up   Dispo: The patient is from: Home              Anticipated d/c is to: TBD              Anticipated d/c date is: 3 days              Patient currently is not medically stable to d/c.   Consultants:   Neurology  Procedures:  EEG Antimicrobials: Anti-infectives (From admission, onward)   None     Subjective: Patient was seen and examined at bedside.  Overnight events noted.   She appears very confused, arousable,  lies on the bed with eyes closed,   She is following commands,  noted to have hand tremors. slow in response.    Objective: Vitals:   10/25/19 0318 10/25/19 0742 10/25/19 1009 10/25/19 1217  BP: 131/90 (!) 145/90 (!) 138/97 (!) 148/84  Pulse: 78 92 84 82  Resp: 17 18  18   Temp: 98.6 F (37 C) 98.7 F (37.1 C)  97.9 F (36.6  C)  TempSrc: Oral Oral  Oral  SpO2:  96%  94%    Intake/Output Summary (Last 24 hours) at 10/25/2019 1257 Last data filed at 10/25/2019 0500 Gross per 24 hour  Intake --  Output 510 ml  Net -510 ml   There were no vitals filed for this visit.  Examination:  General exam: Appears calm and comfortable, confused, Anxious. Respiratory system: Clear to auscultation. Respiratory effort normal. Cardiovascular system: S1 & S2 heard, RRR. No JVD, murmurs, rubs, gallops or clicks. No pedal edema. Gastrointestinal system: Abdomen is nondistended, soft and nontender. No organomegaly or masses felt.  Normal bowel sounds heard. Central nervous system: Alert and oriented. No focal neurological deficits. Extremities: No edema, No cyanosis, No clubbing Skin: No rashes, lesions or ulcers Psychiatry: Judgement and insight appear normal. Mood & affect appropriate.     Data Reviewed: I have personally reviewed following labs and imaging studies  CBC: Recent Labs  Lab 10/20/19 0553 10/21/19 0505 10/25/19 0709  WBC 8.7 5.3 10.6*  HGB 14.7 13.9 15.9*  HCT 44.1 41.5 46.5*  MCV 94.4 95.2 94.3  PLT 289 245 250   Basic Metabolic Panel: Recent Labs  Lab 10/20/19 0553 10/21/19 0505 10/23/19 0143 10/25/19 0709  NA 140 140 142 138  K 3.4* 3.7 3.5 3.6  CL 108 111 109 107  CO2 22 19* 20* 20*  GLUCOSE 102* 121* 136* 142*  BUN 9 11 22 22   CREATININE 0.85 0.77 0.70 0.71  CALCIUM 9.9 8.6* 9.3 9.4  MG  --   --   --  2.5*  PHOS  --   --   --  2.6   GFR: CrCl cannot be calculated (Unknown ideal weight.). Liver Function Tests: Recent Labs  Lab 10/20/19 0553 10/23/19 0143 10/25/19 0709  AST 49* 31 28  ALT 50* 54* 69*  ALKPHOS 59 50 53  BILITOT 1.1 1.2 1.5*  PROT 6.4* 5.4* 5.8*  ALBUMIN 4.0 3.3* 3.3*   No results for input(s): LIPASE, AMYLASE in the last 168 hours. No results for input(s): AMMONIA in the last 168 hours. Coagulation Profile: No results for input(s): INR, PROTIME in  the last 168 hours. Cardiac Enzymes: No results for input(s): CKTOTAL, CKMB, CKMBINDEX, TROPONINI in the last 168 hours. BNP (last 3 results) No results for input(s): PROBNP in the last 8760 hours. HbA1C: No results for input(s): HGBA1C in the last 72 hours. CBG: Recent Labs  Lab 10/20/19 0528 10/20/19 0854  GLUCAP 110* 108*   Lipid Profile: No results for input(s): CHOL, HDL, LDLCALC, TRIG, CHOLHDL, LDLDIRECT in the last 72 hours. Thyroid Function Tests: No results for input(s): TSH, T4TOTAL, FREET4, T3FREE, THYROIDAB in the last 72 hours. Anemia Panel: No results for input(s): VITAMINB12, FOLATE, FERRITIN, TIBC, IRON, RETICCTPCT in the last 72 hours. Sepsis Labs: No results for input(s): PROCALCITON, LATICACIDVEN in the last 168 hours.  Recent Results (from the past 240 hour(s))  SARS Coronavirus 2 by RT PCR (hospital order, performed in [facility] hospital lab) Nasopharyngeal Nasopharyngeal Swab     Status: None   Collection Time: 10/20/19 10:24 AM   Specimen: Nasopharyngeal Swab  Result Value Ref Range  Status   SARS Coronavirus 2 NEGATIVE NEGATIVE Final    Comment: (NOTE) SARS-CoV-2 target nucleic acids are NOT DETECTED.  The SARS-CoV-2 RNA is generally detectable in upper and lower respiratory specimens during the acute phase of infection. The lowest concentration of SARS-CoV-2 viral copies this assay can detect is 250 copies / mL. A negative result does not preclude SARS-CoV-2 infection and should not be used as the sole basis for treatment or other patient management decisions.  A negative result may occur with improper specimen collection / handling, submission of specimen other than nasopharyngeal swab, presence of viral mutation(s) within the areas targeted by this assay, and inadequate number of viral copies (<250 copies / mL). A negative result must be combined with clinical observations, patient history, and epidemiological information.  Fact Sheet for  Patients:   StrictlyIdeas.no  Fact Sheet for Healthcare Providers: BankingDealers.co.za  This test is not yet approved or  cleared by the [place] FDA and has been authorized for detection and/or diagnosis of SARS-CoV-2 by FDA under an Emergency Use Authorization (EUA).  This EUA will remain in effect (meaning this test can be used) for the duration of the COVID-19 declaration under Section 564(b)(1) of the Act, 21 U.S.C. section 360bbb-3(b)(1), unless the authorization is terminated or revoked sooner.  Performed at [facility], [place] [address] [place], [place] [address]      Radiology Studies: No results found.  Scheduled Meds: . carbidopa-levodopa  1 tablet Oral TID  . enoxaparin (LOVENOX) injection  40 mg Subcutaneous Q24H  . methimazole  5 mg Oral TID  . pantoprazole  40 mg Oral Daily  . propranolol ER  60 mg Oral Daily  . sertraline  50 mg Oral Daily  . sodium chloride flush  3 mL Intravenous Q12H   Continuous Infusions:    LOS: 4 days    Time spent:  25 mins.    Shawna Clamp, MD [facility]   If 7PM-7AM, please contact night-coverage

## 2019-10-26 ENCOUNTER — Inpatient Hospital Stay (HOSPITAL_COMMUNITY): Payer: Medicare Other

## 2019-10-26 LAB — MISC LABCORP TEST (SEND OUT): Labcorp test code: 505265

## 2019-10-26 LAB — BASIC METABOLIC PANEL
Anion gap: 7 (ref 5–15)
BUN: 22 mg/dL (ref 8–23)
CO2: 24 mmol/L (ref 22–32)
Calcium: 8.9 mg/dL (ref 8.9–10.3)
Chloride: 109 mmol/L (ref 98–111)
Creatinine, Ser: 0.75 mg/dL (ref 0.44–1.00)
GFR calc Af Amer: >60 mL/min (ref >60)
GFR calc non Af Amer: >60 mL/min (ref >60)
Glucose, Bld: 96 mg/dL (ref 70–99)
Potassium: 3.8 mmol/L (ref 3.5–5.1)
Sodium: 140 mmol/L (ref 135–145)

## 2019-10-26 LAB — ANA W/REFLEX IF POSITIVE: Anti Nuclear Antibody (ANA): NEGATIVE

## 2019-10-26 LAB — HEMOGLOBIN AND HEMATOCRIT, BLOOD
HCT: 44.2 % (ref 36.0–46.0)
Hemoglobin: 14.9 g/dL (ref 12.0–15.0)

## 2019-10-26 LAB — RPR: RPR Ser Ql: NONREACTIVE

## 2019-10-26 MED ORDER — IOHEXOL 9 MG/ML PO SOLN
500.0000 mL | ORAL | Status: AC
  Administered 2019-10-26 (×2): 500 mL via ORAL

## 2019-10-26 MED ORDER — IOHEXOL 300 MG/ML  SOLN
100.0000 mL | Freq: Once | INTRAMUSCULAR | Status: AC | PRN
  Administered 2019-10-26: 100 mL via INTRAVENOUS

## 2019-10-26 NOTE — Progress Notes (Signed)
Physical Therapy Treatment Patient Details Name: Hannah Padilla MRN: [id number] DOB: 1950-08-29 Today's Date: 10/26/2019    History of Present Illness 69 yo female with onset of Parkinson's like symptoms was admitted to ED, with recurrent changes of metabolic encephalopathy, AMS, panic attacks, and tremors. Pt is rapidly declining in her mobility to approach dependent care.  Neurologists are working to diagnose her condition. PMHx:  [place] spotted fever, UTI, dementia, incontinence, colon polyp, mood changes, hyperthyroidism, anxiety,     PT Comments    Patient alert and answering some questions with one word answers. Able to state the color of her hospital socks. Husband present and assisted with sitting pt on EOB and sit to stand (+2 max-total assist). Patient noted to be developing Left elbow contracture (unable to fully extend even after pt asleep from fatigue of session). Will notify OT.     Follow Up Recommendations  SNF     Equipment Recommendations  None recommended by PT    Recommendations for Other Services Speech consult     Precautions / Restrictions Precautions Precautions: Fall    Mobility  Bed Mobility Overal bed mobility: Needs Assistance Bed Mobility: Rolling;Sidelying to Sit;Sit to Sidelying Rolling: Total assist Sidelying to sit: Total assist;+2 for physical assistance     Sit to sidelying: Total assist;+2 for physical assistance General bed mobility comments: rolled to her left  Transfers Overall transfer level: Needs assistance Equipment used: 2 person hand held assist Transfers: Sit to/from Stand Sit to Stand: Max assist;+2 physical assistance         General transfer comment: posterior lean and inability to fully stand upright (forward flexxed at hips/trucnk)  Ambulation/Gait                 Stairs             Wheelchair Mobility    Modified Rankin (Stroke Patients Only)       Balance Overall balance assessment:  Needs assistance;History of Falls Sitting-balance support: Feet supported Sitting balance-Leahy Scale: Poor   Postural control: Posterior lean Standing balance support: Bilateral upper extremity supported;During functional activity Standing balance-Leahy Scale: Zero                              Cognition Arousal/Alertness: Awake/alert Behavior During Therapy: Anxious;Flat affect Overall Cognitive Status: Difficult to assess Area of Impairment: Attention;Memory;Following commands;Safety/judgement;Awareness;Problem solving                 Orientation Level:  (NT) Current Attention Level: Sustained;Focused   Following Commands: Follows one step commands inconsistently     Problem Solving: Slow processing;Decreased initiation;Difficulty sequencing;Requires verbal cues;Requires tactile cues General Comments: moves from flat affect to repeated mumbling (husband indicates anxiety initial symptom); able to state the color of her socks      Exercises Other Exercises Other Exercises: Attempted PROM/stretching of left extremities with pt in supine and noted strong extensor tone in LLE and flexor tone in LUE; repositioned in sidelying and able to fully flex hips and knees into "fetal position" able to DF ankles to neutral; could not fully extend LUE at elbow    General Comments General comments (skin integrity, edema, etc.): Husband present and very informative re: pt's progression of symptoms      Pertinent Vitals/Pain Pain Assessment: Faces    Home Living  Prior Function            PT Goals (current goals can now be found in the care plan section) Acute Rehab PT Goals Patient Stated Goal: per husband to find out what is wrong Time For Goal Achievement: 11/03/19 Potential to Achieve Goals: Good Progress towards PT goals: Not progressing toward goals - comment (unable to safely walk today)    Frequency    Min 2X/week       PT Plan Current plan remains appropriate    Co-evaluation              AM-PAC PT "6 Clicks" Mobility   Outcome Measure  Help needed turning from your back to your side while in a flat bed without using bedrails?: A Lot Help needed moving from lying on your back to sitting on the side of a flat bed without using bedrails?: Total Help needed moving to and from a bed to a chair (including a wheelchair)?: Total Help needed standing up from a chair using your arms (e.g., wheelchair or bedside chair)?: A Lot Help needed to walk in hospital room?: Total Help needed climbing 3-5 steps with a railing? : Total 6 Click Score: 8    End of Session   Activity Tolerance: Patient tolerated treatment well Patient left: in bed;with call bell/phone within reach;with family/visitor present;with bed alarm set (in ED ) Nurse Communication: Mobility status;Other (comment) (pt should be positioned on side for sleeping and to decr ton) PT Visit Diagnosis: Unsteadiness on feet (R26.81);Muscle weakness (generalized) (M62.81);History of falling (Z91.81);Other abnormalities of gait and mobility (R26.89);Other symptoms and signs involving the nervous system (R29.898)     Time: 5465-0354 PT Time Calculation (min) (ACUTE ONLY): 47 min  Charges:  $Therapeutic Exercise: 8-22 mins $Therapeutic Activity: 23-37 mins                      Arby Barrette, PT Pager [phone]    Rexanne Mano 10/26/2019, 5:05 PM

## 2019-10-26 NOTE — Progress Notes (Signed)
I updated the husband about the plan.  He reports that he feels the patient's mental status and interactivity have much improved since starting the steroids.  Although she does still wax and wane during the course of the day, overall he does feel that she has been improving since admission.  He is aware that steroids can just generally make someone feel better, and is aware of the possibility of CJD.  We discussed that the 14 3 3  ETA isoform was negative but the 14 3 3  RT-QUIC studies still needed to be sent.  Additionally we will send [facility] encephalopathy panel as this tests for more antibodies and then the paraneoplastic panel which was sent earlier ([facility] paraneoplastic panel)  We will additionally discussed the risks and benefits of IVIG treatment, the main benefit being additional protection against possible autoimmune disease while awaiting final diagnosis.  When we discussed that the risks include headache as well as clotting disorders, has been brought up that the patient has a strong family history of protein S deficiency, which she has not been personally tested for.  From a brief review of the literature, it does not appear that testing in absence of blood clotting and testing in the setting of acute illness (which can affect acute phase reactants) will be easy to interpret.  I will plan to curbside hematology about this in the morning.   # Repeat LP  - Cell count, protein, glucose - [facility] encephalopathy panel  - 14-3-3 Case Western panel (with RT-QuIC and tau testing)   # Protein S deficiency -Will curbside hematology about any Protein S deficiency testing needed prior to considering Rudy MD-PhD [facility] [phone]

## 2019-10-26 NOTE — Progress Notes (Signed)
PROGRESS NOTE    Hannah Padilla  MPN:[id number] DOB: 1950-09-28 DOA: 10/20/2019 PCP: Carlena Hurl, PA-C   Brief Narrative:  Patient is a 69 year old female with history of subclinical hyperthyroidism, RMSF, UTIs presented with progressive decline over the last 3 months.  Prior to the onset of symptoms patient was healthy and able to complete all of her ADLs without any assistance.  Per son, her PCP had diagnosed her with hyperthyroidism, and was placed on methimazole and propranolol.  Reported that she had been having panic attacks, tremor, intermittent sweating, generalized weakness, falls, difficulty swallowing, hallucinations, insomnia and bowel and urine incontinence. Patient had extensive work-up including lumbar puncture, MRI, pheochromocytoma work-up, RMSF was positive.  Patient is currently followed outpatient by Dr. Jannifer Franklin.  She was placed on Valium which helped somewhat with the panic attacks,  Seroquel was attempted however patient became catatonic and this was discontinued after 2 doses. Patient was admitted for further work-up.  Neurology suspecting CJD remains very possible cause for her rapid neurological decline.  Ordered 24-hour urine collection for heavy metals,  Porphobilinogen, delta alae.  Assessment & Plan:   Principal Problem:   Metabolic encephalopathy Active Problems:   Anxiety   Transaminitis   Subclinical hyperthyroidism   Dysphagia   Acute metabolic encephalopathy   Acute metabolic encephalopathy with relatively rapid and progressive decline. - Significant neurological decline in the last 3 months.  Still unclear etiology. -Neurology following, started on high-dose steroids x 5 days. Completed 10/24/19. -Further work-up by neurology including heavy metals, repeat EEG, delta LA, paraneoplastic panel  -EEG showed mild to moderate diffuse encephalopathy, nonspecific.    -2 episodes of right hand tremor-like movements were most likely nonepileptic -There is no  significant improvement after completion of IV steroids. - Neurology suspecting CJD remains very possible cause for her rapid neurological decline. - Neurology is considering IVIG treatment and CT imaging to rule out malignancy.   Subclinical hyperthyroidism -Does not fully explain patient's symptoms with progressive neuro decline.    Home regimen includes methimazole 5 mg 3 times daily.    Admitting physician, Dr. Fuller Plan discussed with Dr. Delrae Rend. -TSH 0.8, free T4 1.96 (improving from 2.33), total T4 10.5, T3 73    - Dr. Buddy Duty will be able to see her outpatient, will need ambulatory referral to Dr. Buddy Duty- endocrinology at the time of discharge -Continue methimazole and propranolol.   Anxiety, insomnia -Per patient's husband at the bedside, Valium has helped at home, will continue 2.5 mg every 12 hours.   Transaminitis -LFTs improving.  Dysphagia SLP evaluation completed on 9/16, recommended dysphagia 3 diet with thin liquids   DVT prophylaxis:  Lovenox. Code Status: Full Family Communication:  Husband at bed side. All questions answered. Disposition Plan:  Status is: Inpatient  Remains inpatient appropriate because:Ongoing diagnostic testing needed not appropriate for outpatient work up   Dispo: The patient is from: Home              Anticipated d/c is to: TBD              Anticipated d/c date is: 3 days              Patient currently is not medically stable to d/c.   Consultants:   Neurology  Procedures:  EEG Antimicrobials: Anti-infectives (From admission, onward)   None     Subjective: Patient was seen and examined at bedside.  No overnight events. Patient is lying comfortably on the bed,  following commands  but appears confused. She opens her eyes but seems anxious.    Objective: Vitals:   10/26/19 0415 10/26/19 0753 10/26/19 1030 10/26/19 1255  BP: 105/77 123/78 112/87 126/82  Pulse: 69 71 84 71  Resp: 16 16  18   Temp: 98.1 F  (36.7 C) 98.4 F (36.9 C)  (!) 97.4 F (36.3 C)  TempSrc: Oral Oral  Axillary  SpO2: 99% 98%  98%    Intake/Output Summary (Last 24 hours) at 10/26/2019 1443 Last data filed at 10/26/2019 0930 Gross per 24 hour  Intake 480 ml  Output 250 ml  Net 230 ml   There were no vitals filed for this visit.  Examination:  General exam: Appears calm and comfortable, confused, Anxious. Respiratory system: Clear to auscultation. Respiratory effort normal. Cardiovascular system: S1 & S2 heard, RRR. No JVD, murmurs, rubs, gallops or clicks. No pedal edema. Gastrointestinal system: Abdomen is nondistended, soft and nontender. No organomegaly or masses felt.  Normal bowel sounds heard. Central nervous system: Alert and oriented. No focal neurological deficits. Extremities: No edema, No cyanosis, No clubbing Skin: No rashes, lesions or ulcers Psychiatry:  Mood & affect appropriate. Slow in response    Data Reviewed: I have personally reviewed following labs and imaging studies  CBC: Recent Labs  Lab 10/20/19 0553 10/21/19 0505 10/25/19 0709 10/26/19 0150  WBC 8.7 5.3 10.6*  --   HGB 14.7 13.9 15.9* 14.9  HCT 44.1 41.5 46.5* 44.2  MCV 94.4 95.2 94.3  --   PLT 289 245 314  --    Basic Metabolic Panel: Recent Labs  Lab 10/20/19 0553 10/21/19 0505 10/23/19 0143 10/25/19 0709 10/26/19 0150  NA 140 140 142 138 140  K 3.4* 3.7 3.5 3.6 3.8  CL 108 111 109 107 109  CO2 22 19* 20* 20* 24  GLUCOSE 102* 121* 136* 142* 96  BUN 9 11 22 22 22   CREATININE 0.85 0.77 0.70 0.71 0.75  CALCIUM 9.9 8.6* 9.3 9.4 8.9  MG  --   --   --  2.5*  --   PHOS  --   --   --  2.6  --    GFR: CrCl cannot be calculated (Unknown ideal weight.). Liver Function Tests: Recent Labs  Lab 10/20/19 0553 10/23/19 0143 10/25/19 0709  AST 49* 31 28  ALT 50* 54* 69*  ALKPHOS 59 50 53  BILITOT 1.1 1.2 1.5*  PROT 6.4* 5.4* 5.8*  ALBUMIN 4.0 3.3* 3.3*   No results for input(s): LIPASE, AMYLASE in the last 168  hours. No results for input(s): AMMONIA in the last 168 hours. Coagulation Profile: No results for input(s): INR, PROTIME in the last 168 hours. Cardiac Enzymes: No results for input(s): CKTOTAL, CKMB, CKMBINDEX, TROPONINI in the last 168 hours. BNP (last 3 results) No results for input(s): PROBNP in the last 8760 hours. HbA1C: No results for input(s): HGBA1C in the last 72 hours. CBG: Recent Labs  Lab 10/20/19 0528 10/20/19 0854  GLUCAP 110* 108*   Lipid Profile: No results for input(s): CHOL, HDL, LDLCALC, TRIG, CHOLHDL, LDLDIRECT in the last 72 hours. Thyroid Function Tests: No results for input(s): TSH, T4TOTAL, FREET4, T3FREE, THYROIDAB in the last 72 hours. Anemia Panel: Recent Labs    10/25/19 1507  VITAMINB12 124*   Sepsis Labs: No results for input(s): PROCALCITON, LATICACIDVEN in the last 168 hours.  Recent Results (from the past 240 hour(s))  SARS Coronavirus 2 by RT PCR (hospital order, performed in [facility] hospital lab) Nasopharyngeal  Nasopharyngeal Swab     Status: None   Collection Time: 10/20/19 10:24 AM   Specimen: Nasopharyngeal Swab  Result Value Ref Range Status   SARS Coronavirus 2 NEGATIVE NEGATIVE Final    Comment: (NOTE) SARS-CoV-2 target nucleic acids are NOT DETECTED.  The SARS-CoV-2 RNA is generally detectable in upper and lower respiratory specimens during the acute phase of infection. The lowest concentration of SARS-CoV-2 viral copies this assay can detect is 250 copies / mL. A negative result does not preclude SARS-CoV-2 infection and should not be used as the sole basis for treatment or other patient management decisions.  A negative result may occur with improper specimen collection / handling, submission of specimen other than nasopharyngeal swab, presence of viral mutation(s) within the areas targeted by this assay, and inadequate number of viral copies (<250 copies / mL). A negative result must be combined with  clinical observations, patient history, and epidemiological information.  Fact Sheet for Patients:   StrictlyIdeas.no  Fact Sheet for Healthcare Providers: BankingDealers.co.za  This test is not yet approved or  cleared by the [place] FDA and has been authorized for detection and/or diagnosis of SARS-CoV-2 by FDA under an Emergency Use Authorization (EUA).  This EUA will remain in effect (meaning this test can be used) for the duration of the COVID-19 declaration under Section 564(b)(1) of the Act, 21 U.S.C. section 360bbb-3(b)(1), unless the authorization is terminated or revoked sooner.  Performed at [facility], [place] [address] [place], [place] [address]      Radiology Studies: CT CHEST ABDOMEN PELVIS W CONTRAST  Result Date: 10/26/2019 CLINICAL DATA:  Rapidly progressive dementia, rule out malignancy EXAM: CT CHEST, ABDOMEN, AND PELVIS WITH CONTRAST TECHNIQUE: Multidetector CT imaging of the chest, abdomen and pelvis was performed following the standard protocol during bolus administration of intravenous contrast. CONTRAST:  171mL OMNIPAQUE IOHEXOL 300 MG/ML  SOLN COMPARISON:  08/31/2019 FINDINGS: CT CHEST FINDINGS Cardiovascular: Heart size normal. No pericardial effusion. Central pulmonary arteries unremarkable. Adequate contrast opacification of the thoracic aorta with no evidence of dissection, aneurysm, or stenosis. There is classic 3-vessel brachiocephalic arch anatomy without proximal stenosis. No significant atheromatous change. Mediastinum/Nodes: No mass or adenopathy. Lungs/Pleura: No pleural effusion. No pneumothorax. Linear scarring or subsegmental atelectasis medially in the left lower lobe. Dependent atelectasis posteriorly in both lower lobes. Musculoskeletal: Orthopedic anchor in the right humeral head. Mild superior endplate compression deformities of T8 and T10. No definite acute fracture. CT ABDOMEN PELVIS  FINDINGS Hepatobiliary: No focal liver abnormality is seen. No gallstones, gallbladder wall thickening, or biliary dilatation. Pancreas: Unremarkable. No pancreatic ductal dilatation or surrounding inflammatory changes. Spleen: Normal in size without focal abnormality. Adrenals/Urinary Tract: Stable left adrenal fullness. Kidneys enhance normally without mass or hydronephrosis. Urinary bladder physiologically distended. Stomach/Bowel: Stomach is nondistended. Small bowel decompressed. Appendix not discretely identified. No pericecal inflammatory/edematous change. The colon is nondilated, unremarkable. Vascular/Lymphatic: Minimal calcified aortoiliac plaque. No aneurysm or stenosis. Portal vein patent. No abdominal or pelvic adenopathy. Reproductive: Status post hysterectomy. No adnexal masses. Other: No ascites.  Bilateral pelvic phleboliths.  No free air. Musculoskeletal: Small supraumbilical hernia containing only mesenteric fat. Lumbar levoscoliosis apex L2 without underlying vertebral anomaly. No fracture or worrisome bone lesion. IMPRESSION: 1. No evidence of primary or metastatic disease. 2. Small supraumbilical hernia containing only mesenteric fat. Aortic Atherosclerosis (ICD10-I70.0). Electronically Signed   By: Lucrezia Europe M.D.   On: 10/26/2019 14:39    Scheduled Meds: . carbidopa-levodopa  1 tablet Oral TID  .  cyanocobalamin  1,000 mcg Intramuscular Daily  . enoxaparin (LOVENOX) injection  40 mg Subcutaneous Q24H  . methimazole  5 mg Oral TID  . pantoprazole  40 mg Oral Daily  . propranolol ER  60 mg Oral Daily  . sertraline  50 mg Oral Daily  . sodium chloride flush  3 mL Intravenous Q12H   Continuous Infusions:    LOS: 5 days    Time spent:  25 mins.    Shawna Clamp, MD [facility]   If 7PM-7AM, please contact night-coverage

## 2019-10-26 NOTE — Progress Notes (Addendum)
Subjective: States "I want to go home"  Exam: Vitals:   10/25/19 2357 10/26/19 0415  BP: 120/76 105/77  Pulse: 72 69  Resp: 20 16  Temp: 98.8 F (37.1 C) 98.1 F (36.7 C)  SpO2: 98% 99%   Gen: In bed, NAD Psych: anxious Resp: non-labored breathing, no acute distress Abd: soft, nt  Neuro: MS: Awake, alert, interactive, follows some commands. Reports she is in her 57s, answers birth year for what the current year is, states she is at [facility]. Making "oh oh oh" sounds fairly constantly CN: Extraocular movements with slightly limited upgaze, saccadic pursuits Motor: She has resting tremor of the right hand, keeps both hands clenched -- hand splints in place. Spastic tone in the bilateral upper extremities. Lifts both legs.  Sensory: Responsive to light touch throughout  Hyperreflexia, 3+ thorughout including pectoral reflexes on exam 9/19, not repeated today  Pertinent Labs: TSH WNL Free T4 1.96(high) Ammonia 7/14 - 17 B1 - nml RMSF borderline positive IgG, negative IgM(she completed 10 day course of doxy in July) Lyme - negative ESR 2 Thyroglobulin and Thyroid peroxidase Ab - nml Anti-hu,yo,ri - negative By husbands report, 24 hour urine metanephrines "inconclusive"  Unresulted Labs (From admission, onward)          Start     Ordered   10/25/19 1106  ANA w/Reflex if Positive  Once,   R       Question:  Specimen collection method  Answer:  Lab=Lab collect   10/25/19 1106   10/25/19 1104  RPR  Once,   R       Question:  Specimen collection method  Answer:  Lab=Lab collect   10/25/19 1105   10/25/19 1053  Miscellaneous [facility] test (send-out)  Once,   R       Comments: On serum   Question:  Test name / description:  Answer:  Autoimmune encephalopathy panel to [facility], test code ENS2   10/25/19 1053   10/22/19 1502  Miscellaneous [facility] test (send-out)  Once,   R       Question:  Test name / description:  Answer:  LGI1 antibody   10/22/19 1502   10/20/19 2001   Miscellaneous [facility] test (send-out)  Once,   STAT       Question:  Test name / description:  Answer:  24 hour urine for heavy metals, porphobilinogen and delta-ALA   10/20/19 2002           CSF:  WBC 3 RBC 29 Protein 38 Glucose 56 OC Bands- negative Paraneoplastic CSF study - negative(did not include LGI1) Neuron Specific Enolase - negative.   14-3-3 eta - negative.   Impression: 69 year old female with rapidly progressive dementia of unclear etiology.  Everything, her hyperreflexia, her tremor, trouble walking(seems to be at least some component of gait apraxia) all appears relatively global, without focality.  CJD continues to be a possibility as does autoimmune encephalopathy.  Hyperthyroidism can cause some of the symptoms described and she is being treated for this as well.  Given that this could be autoimmune even if her paraneoplastic panel is negative, a 5 day course of steroids was completed 9/14-9/18. The progression seems too fast for corticobasal syndrome or multiple system atrophy, though CBS may still be a possibility. The protein 14-3-3 isoform sent is more commonly used for rheumatic disease; will check if Case Western 14-3-3 RTQuic and Tau panel can be added on to prior CSF. If not, will need repeat LP. Will also  consider IVIG course if family concurs that patient has stabilized with steroid treatment  Recommendations: - Solu-Medrol completed 9/18 - 24-hour urine for heavy metals, porphobilinogen, delta-ALA pending - Serum for anti-LGI1 pending - Serum paraneoplastic panel pending - Reached out to Dr. Jannifer Franklin this week to try to add Tau/14-3-3 panel to CSF already collected, may need repeat LP if this cannot be added on - Will consider IVIG after discussion with family  - Continue treatment for hyperthyroidism.  - CT chest abdomen pelvis for malignancy screening  Lesleigh Noe MD-PhD [facility] [phone]   If 7pm- 7am, please page neurology on  call as listed in [place].

## 2019-10-27 LAB — MISC LABCORP TEST (SEND OUT): Labcorp test code: 505355

## 2019-10-27 LAB — ANTITHROMBIN III: AntiThromb III Func: 110 % (ref 75–120)

## 2019-10-27 MED ORDER — CYANOCOBALAMIN 1000 MCG/ML IJ SOLN: 1000.0000 ug | INTRAMUSCULAR | Status: DC

## 2019-10-27 MED ORDER — CYANOCOBALAMIN 1000 MCG/ML IJ SOLN: 100.0000 ug | Freq: Every day | INTRAMUSCULAR | Status: DC

## 2019-10-27 NOTE — Progress Notes (Signed)
  Speech Language Pathology Treatment: Dysphagia  Patient Details Name: Hannah Padilla MRN: [id number] DOB: [birth year] Today's Date: 10/27/2019 Time: 1300-1330 SLP Time Calculation (min) (ACUTE ONLY): 30 min  Assessment / Plan / Recommendation Clinical Impression  Pt was seen at bedside for assessment of diet tolerance and continued education. Husband was present, and was just finishing feeding pt lunch. He reports she has a good appetite, and consumed all of her Dys3 lunch without overt s/s aspiration on any consistency. He is very attentive and appropriate with her. Will continue current diet for now.  Husband asked questions regarding how to facilitate communication with pt. He was encouraged to engage her in simple conversation, keep questions easy. He mentioned she enjoys music, so I encouraged him to provide music for her to listen to, sing with her, etc. He was educated regarding the importance of deep breathing for adequate vocal loudness, and was provided with an incentive spirometer to work with (they are familiar with this device from a previous admit). Pt would benefit from a more formal cognitive linguistic evaluation to determine specific speech, language, and cognitive needs.    HPI HPI: 69 yo female with onset of Parkinson's like symptoms was admitted to ED, with recurrent changes of metabolic encephalopathy, AMS, panic attacks, and tremors. Pt is rapidly declining in her mobility to approach dependent care.  Neurologists are working to diagnose her condition. PMHx:  Rocky mountain spotted fever, UTI, dementia, incontinence, colon polyp, mood changes, hyperthyroidism, anxiety,      SLP Plan  Continue with current plan of care       Recommendations  Diet recommendations: Dysphagia 3 (mechanical soft);Thin liquid Liquids provided via: Straw;Cup Medication Administration: Other (Comment) (as tolerated) Supervision: Full supervision/cueing for compensatory strategies;Staff to assist  with self feeding Compensations: Minimize environmental distractions;Slow rate;Small sips/bites;Follow solids with liquid Postural Changes and/or Swallow Maneuvers: Seated upright 90 degrees;Upright 30-60 min after meal                Oral Care Recommendations: Oral care BID Follow up Recommendations: 24 hour supervision/assistance;Skilled Nursing facility SLP Visit Diagnosis: Dysphagia, unspecified (R13.10) Plan: Continue with current plan of care       [place], [facility], [place] Speech Language Pathologist Office: [phone] Pager: [phone]  Shonna Chock 10/27/2019, 1:38 PM

## 2019-10-27 NOTE — Progress Notes (Addendum)
ID: This is a previously healthy 69-year-old woman who has had a rapidly progressive dementia starting in June 2021.  She was treated with pulse dose steroids from 9/14-9/18, and then started on B12 repletion and Sinemet on 9/19 with improvement in her symptoms  Subjective: Husband continues to report further improvement in the patient's ability to converse with him No acute complaints  Exam: Vitals:   10/27/19 1518 10/27/19 2002  BP: 116/76 107/68  Pulse: 78 75  Resp: 18 16  Temp: 99.2 F (37.3 C) 97.6 F (36.4 C)  SpO2: 97% 98%   Gen: In bed, NAD Psych: anxious, but masked facies  Resp: non-labored breathing, no acute distress Abd: soft, nt  Neuro: MS: Awake, alert, interactive, follows some commands. Reports her name, then preseverates on this answer when asked Husband's name. Reports she is at a different hospital.  CN: Extraocular movements with slightly limited upgaze, saccadic pursuits Motor: She has cogwheeling tremor of the left upper extremity, increased tone in the bilateral upper extremities and increased tone in the lower extremities  Sensory: Responsive to light touch throughout  Hyperreflexia, 3+ thorughout including pectoral reflexes on exam 9/19, not repeated today Gait: On attempt to stand she has difficulty placing her feet and profound retropulsion   Pertinent Labs: TSH WNL Free T4 1.96(high) Ammonia 7/14 - 17 B1 - nml RMSF borderline positive IgG, negative IgM(she completed 10 day course of doxy in July) Lyme - negative ESR 2 Thyroglobulin and Thyroid peroxidase Ab - nml Anti-hu,yo,ri - negative By husbands report, 24 hour urine metanephrines "inconclusive"    CSF:  WBC 3 RBC 29 Protein 38 Glucose 56 OC Bands- negative Paraneoplastic CSF study - negative(did not include LGI1) Neuron Specific Enolase - negative.  Serum anti-LGI1 - negative Serum autoimmune encephalopathy panel - negative 14-3-3 eta - negative.   CT chest abdomen pelvis  negative   Impression: 69-year-old female with rapidly progressive dementia of unclear etiology.  On today's examination, as her confusion has cleared, I am most struck by her parkinsonian features, with rest tremor of the left upper extremity, severe retropulsion and masked facies.  I am impressed by the fact that her mental status has improved in the last few days.  This appears to correlate more with the initiation of Sinemet and B12 then with steroid treatment.  Given that neurodegenerative processes such as CJD do not typically improve in this fashion, I am less inclined to do a lumbar puncture at this point.  Given the patient's family history of hypercoagulability disorder, I am reluctant to start IVIG for potential autoimmune process.  Rather I favor continuing B12 and Sinemet, with up titration of the latter and further work-up for potential Parkinson's disease to continue most appropriately in the outpatient setting.  Recommendations: - Hypercoag panel for future consideration of IVIG in the future should she decline again with concern for relapsing autoimmune process; would hold on IVIG at this point given improvement in her symptoms - 24-hour urine for heavy metals, porphobilinogen, delta-ALA -- failed collection, no need to repeat inpatient at this time given low concern for these etiologies at this time - Continue treatment for hyperthyroidism - Checking anti-intrinsic factor antibodies to work-up B12 deficiency - Outpatient neurologist may consider DatScan for evaluation of potential Parkinson's disease - Continue 1000 mcg B12 IM daily for 1 week course, then 1000 mcg IM weekly  - Neurology to confirm patient continues to improve tomorrow    MD-PhD [facility] [phone]   If 7pm- 7am, please   page neurology on call as listed in [place].

## 2019-10-27 NOTE — Progress Notes (Addendum)
Called from the main lab that the 24hour urine collection from 10-24-19-10-25-19 was insufficient volume for heavy metals and for the  ALA delta and the porphobilinogen test an acidic acid jug has to be ordered from an outside source; Dr.Bhagat notified that urine was rejected from the lab; MD to reorder tests as indicated.

## 2019-10-27 NOTE — Progress Notes (Signed)
PROGRESS NOTE    Hannah Padilla  ASN:[id number] DOB: [birth year] DOA: 10/20/2019 PCP: Carlena Hurl, PA-C   Brief Narrative:  Patient is a 69 year old female with history of subclinical hyperthyroidism, RMSF, UTIs presented with progressive decline over the last 3 months. Prior to the onset of symptoms patient was healthy and able to complete all of her ADLs without any assistance.  Per son, her PCP had diagnosed her with hyperthyroidism, and was placed on methimazole and propranolol.  Reported that she had been having panic attacks, tremor, intermittent sweating, generalized weakness, falls, difficulty swallowing, hallucinations, insomnia and bowel and urine incontinence. Patient had extensive work-up including lumbar puncture, MRI, pheochromocytoma work-up, RMSF was positive.  Patient is currently followed outpatient by Dr. Jannifer Franklin.  She was placed on Valium which helped somewhat with the panic attacks,  Seroquel was attempted however patient became catatonic and this was discontinued after 2 doses. Patient was admitted for further work-up.  Neurology suspecting CJD remains very possible cause for her rapid neurological decline.  Ordered 24-hour urine collection for heavy metals,  Porphobilinogen, delta alae.  Patient is showing some signs of improvement after IV steroids.  Neurology is considering repeat Lumber puncture and IVIG treatment.  Assessment & Plan:   Principal Problem:   Metabolic encephalopathy Active Problems:   Anxiety   Transaminitis   Subclinical hyperthyroidism   Dysphagia   Acute metabolic encephalopathy   Acute metabolic encephalopathy with relatively rapid and progressive decline. - Significant neurological decline in the last 3 months.  Still unclear etiology. -Neurology following, started on high-dose steroids x 5 days. Completed 10/24/19. -Further work-up by neurology including heavy metals, repeat EEG, delta LA, paraneoplastic panel  -EEG showed mild to moderate  diffuse encephalopathy, nonspecific.    -2 episodes of right hand tremor-like movements were most likely nonepileptic - Patient is showing some signs of improvement, could be after completion of IV steroids. - Neurology suspecting CJD remains very possible cause for her rapid neurological decline. - Neurology is considering IVIG treatment and CT imaging to rule out malignancy. - CT chest abdomen and pelvis negative for primary or metastasis.  Subclinical hyperthyroidism -Does not fully explain patient's symptoms with progressive neuro decline.   -Continue Home regimen includes methimazole 5 mg 3 times daily.    Admitting physician, Dr. Fuller Plan discussed with Dr. Delrae Rend. -TSH 0.8, free T4 1.96 (improving from 2.33), total T4 10.5, T3 73    - Dr. Buddy Duty will be able to see her outpatient, will need ambulatory referral to Dr. Buddy Duty- endocrinology at the time of discharge -Continue methimazole and propranolol.   Anxiety, insomnia -Per patient's husband at the bedside, Valium has helped at home, will continue 2.5 mg every 12 hours. - She appeared much calmer today, less tremors.   Transaminitis -LFTs improving.  Dysphagia SLP evaluation completed on 9/16, recommended dysphagia 3 diet with thin liquids   DVT prophylaxis:  Lovenox. Code Status: Full Family Communication:  Husband at bed side.  He has a lot of questions , All questions answered. Disposition Plan:  Status is: Inpatient  Remains inpatient appropriate because:Ongoing diagnostic testing needed not appropriate for outpatient work up   Dispo: The patient is from: Home              Anticipated d/c is to: TBD              Anticipated d/c date is: 3 days              Patient  currently is not medically stable to d/c.   Consultants:   Neurology  Procedures:  EEG Antimicrobials: Anti-infectives (From admission, onward)   None     Subjective: Patient was seen and examined at bedside.  Overnight events  noted. She appears much calmer and comfortable.  She opened her eyes and smiled.  Tremors are not noticeable today.  Husband at bedside,  stated she is more responsive and improving.   Objective: Vitals:   10/27/19 0359 10/27/19 0732 10/27/19 1128 10/27/19 1154  BP: 124/76 106/88 133/71   Pulse: 77 76 (!) 54 88  Resp: 15 16 16    Temp: 97.8 F (36.6 C) 98.9 F (37.2 C) 98.9 F (37.2 C)   TempSrc:  Oral Oral   SpO2: 99% 99% 96%     Intake/Output Summary (Last 24 hours) at 10/27/2019 1215 Last data filed at 10/27/2019 1202 Gross per 24 hour  Intake 243 ml  Output 755 ml  Net -512 ml   There were no vitals filed for this visit.  Examination:  General exam: Appears calm and comfortable, Opened her eyes on calling her name. Respiratory system: Clear to auscultation. Respiratory effort normal. Cardiovascular system: S1 & S2 heard, RRR. No JVD, murmurs, rubs, gallops or clicks. No pedal edema. Gastrointestinal system: Abdomen is nondistended, soft and nontender. No organomegaly or masses felt.   Normal bowel sounds heard. Central nervous system: Alert and oriented. No focal neurological deficits. Extremities: No edema, No cyanosis, No clubbing, tremors are not noticeable today. Skin: No rashes, lesions or ulcers Psychiatry:  Mood & affect appropriate. Slow in response.    Data Reviewed: I have personally reviewed following labs and imaging studies  CBC: Recent Labs  Lab 10/21/19 0505 10/25/19 0709 10/26/19 0150  WBC 5.3 10.6*  --   HGB 13.9 15.9* 14.9  HCT 41.5 46.5* 44.2  MCV 95.2 94.3  --   PLT 245 314  --    Basic Metabolic Panel: Recent Labs  Lab 10/21/19 0505 10/23/19 0143 10/25/19 0709 10/26/19 0150  NA 140 142 138 140  K 3.7 3.5 3.6 3.8  CL 111 109 107 109  CO2 19* 20* 20* 24  GLUCOSE 121* 136* 142* 96  BUN 11 22 22 22   CREATININE 0.77 0.70 0.71 0.75  CALCIUM 8.6* 9.3 9.4 8.9  MG  --   --  2.5*  --   PHOS  --   --  2.6  --    GFR: CrCl cannot be  calculated (Unknown ideal weight.). Liver Function Tests: Recent Labs  Lab 10/23/19 0143 10/25/19 0709  AST 31 28  ALT 54* 69*  ALKPHOS 50 53  BILITOT 1.2 1.5*  PROT 5.4* 5.8*  ALBUMIN 3.3* 3.3*   No results for input(s): LIPASE, AMYLASE in the last 168 hours. No results for input(s): AMMONIA in the last 168 hours. Coagulation Profile: No results for input(s): INR, PROTIME in the last 168 hours. Cardiac Enzymes: No results for input(s): CKTOTAL, CKMB, CKMBINDEX, TROPONINI in the last 168 hours. BNP (last 3 results) No results for input(s): PROBNP in the last 8760 hours. HbA1C: No results for input(s): HGBA1C in the last 72 hours. CBG: No results for input(s): GLUCAP in the last 168 hours. Lipid Profile: No results for input(s): CHOL, HDL, LDLCALC, TRIG, CHOLHDL, LDLDIRECT in the last 72 hours. Thyroid Function Tests: No results for input(s): TSH, T4TOTAL, FREET4, T3FREE, THYROIDAB in the last 72 hours. Anemia Panel: Recent Labs    10/25/19 1507  VITAMINB12 124*  Sepsis Labs: No results for input(s): PROCALCITON, LATICACIDVEN in the last 168 hours.  Recent Results (from the past 240 hour(s))  SARS Coronavirus 2 by RT PCR (hospital order, performed in [facility] hospital lab) Nasopharyngeal Nasopharyngeal Swab     Status: None   Collection Time: 10/20/19 10:24 AM   Specimen: Nasopharyngeal Swab  Result Value Ref Range Status   SARS Coronavirus 2 NEGATIVE NEGATIVE Final    Comment: (NOTE) SARS-CoV-2 target nucleic acids are NOT DETECTED.  The SARS-CoV-2 RNA is generally detectable in upper and lower respiratory specimens during the acute phase of infection. The lowest concentration of SARS-CoV-2 viral copies this assay can detect is 250 copies / mL. A negative result does not preclude SARS-CoV-2 infection and should not be used as the sole basis for treatment or other patient management decisions.  A negative result may occur with improper specimen collection /  handling, submission of specimen other than nasopharyngeal swab, presence of viral mutation(s) within the areas targeted by this assay, and inadequate number of viral copies (<250 copies / mL). A negative result must be combined with clinical observations, patient history, and epidemiological information.  Fact Sheet for Patients:   StrictlyIdeas.no  Fact Sheet for Healthcare Providers: BankingDealers.co.za  This test is not yet approved or  cleared by the [place] FDA and has been authorized for detection and/or diagnosis of SARS-CoV-2 by FDA under an Emergency Use Authorization (EUA).  This EUA will remain in effect (meaning this test can be used) for the duration of the COVID-19 declaration under Section 564(b)(1) of the Act, 21 U.S.C. section 360bbb-3(b)(1), unless the authorization is terminated or revoked sooner.  Performed at [facility], [place] [address], [place], [place] [address]      Radiology Studies: CT CHEST ABDOMEN PELVIS W CONTRAST  Result Date: 10/26/2019 CLINICAL DATA:  Rapidly progressive dementia, rule out malignancy EXAM: CT CHEST, ABDOMEN, AND PELVIS WITH CONTRAST TECHNIQUE: Multidetector CT imaging of the chest, abdomen and pelvis was performed following the standard protocol during bolus administration of intravenous contrast. CONTRAST:  167mL OMNIPAQUE IOHEXOL 300 MG/ML  SOLN COMPARISON:  08/31/2019 FINDINGS: CT CHEST FINDINGS Cardiovascular: Heart size normal. No pericardial effusion. Central pulmonary arteries unremarkable. Adequate contrast opacification of the thoracic aorta with no evidence of dissection, aneurysm, or stenosis. There is classic 3-vessel brachiocephalic arch anatomy without proximal stenosis. No significant atheromatous change. Mediastinum/Nodes: No mass or adenopathy. Lungs/Pleura: No pleural effusion. No pneumothorax. Linear scarring or subsegmental atelectasis medially in the left  lower lobe. Dependent atelectasis posteriorly in both lower lobes. Musculoskeletal: Orthopedic anchor in the right humeral head. Mild superior endplate compression deformities of T8 and T10. No definite acute fracture. CT ABDOMEN PELVIS FINDINGS Hepatobiliary: No focal liver abnormality is seen. No gallstones, gallbladder wall thickening, or biliary dilatation. Pancreas: Unremarkable. No pancreatic ductal dilatation or surrounding inflammatory changes. Spleen: Normal in size without focal abnormality. Adrenals/Urinary Tract: Stable left adrenal fullness. Kidneys enhance normally without mass or hydronephrosis. Urinary bladder physiologically distended. Stomach/Bowel: Stomach is nondistended. Small bowel decompressed. Appendix not discretely identified. No pericecal inflammatory/edematous change. The colon is nondilated, unremarkable. Vascular/Lymphatic: Minimal calcified aortoiliac plaque. No aneurysm or stenosis. Portal vein patent. No abdominal or pelvic adenopathy. Reproductive: Status post hysterectomy. No adnexal masses. Other: No ascites.  Bilateral pelvic phleboliths.  No free air. Musculoskeletal: Small supraumbilical hernia containing only mesenteric fat. Lumbar levoscoliosis apex L2 without underlying vertebral anomaly. No fracture or worrisome bone lesion. IMPRESSION: 1. No evidence of primary or metastatic disease. 2.  Small supraumbilical hernia containing only mesenteric fat. Aortic Atherosclerosis (ICD10-I70.0). Electronically Signed   By: Lucrezia Europe M.D.   On: 10/26/2019 14:39    Scheduled Meds: . carbidopa-levodopa  1 tablet Oral TID  . cyanocobalamin  1,000 mcg Intramuscular Daily  . enoxaparin (LOVENOX) injection  40 mg Subcutaneous Q24H  . methimazole  5 mg Oral TID  . pantoprazole  40 mg Oral Daily  . propranolol ER  60 mg Oral Daily  . sertraline  50 mg Oral Daily  . sodium chloride flush  3 mL Intravenous Q12H   Continuous Infusions:    LOS: 6 days    Time spent:  25  mins.    Shawna Clamp, MD [facility]   If 7PM-7AM, please contact night-coverage

## 2019-10-28 LAB — HOMOCYSTEINE: Homocysteine: 12.9 umol/L (ref 0.0–17.2)

## 2019-10-28 LAB — LUPUS ANTICOAGULANT PANEL
DRVVT: 33.6 s (ref 0.0–47.0)
PTT Lupus Anticoagulant: 26.1 s (ref 0.0–51.9)

## 2019-10-28 LAB — PROTEIN S, TOTAL: Protein S Ag, Total: 86 % (ref 60–150)

## 2019-10-28 LAB — PROTEIN S ACTIVITY: Protein S Activity: 111 % (ref 63–140)

## 2019-10-28 LAB — CARDIOLIPIN ANTIBODIES, IGG, IGM, IGA
Anticardiolipin IgA: <9 APL U/mL (ref 0–11)
Anticardiolipin IgG: <9 GPL U/mL (ref 0–14)
Anticardiolipin IgM: 10 MPL U/mL (ref 0–12)

## 2019-10-28 LAB — PROTEIN C ACTIVITY: Protein C Activity: 166 % (ref 73–180)

## 2019-10-28 LAB — PROTEIN C, TOTAL: Protein C, Total: 174 % — ABNORMAL HIGH (ref 60–150)

## 2019-10-28 NOTE — Evaluation (Cosign Needed)
Speech Language Pathology Evaluation Patient Details Name: Hannah Padilla MRN: [id number] DOB: Jun 24, 1950 Today's Date: 10/28/2019 Time: (P) 0836-(P) 0939 SLP Time Calculation (min) (ACUTE ONLY): (P) 63 min  Problem List:  Patient Active Problem List   Diagnosis Date Noted   Acute metabolic encephalopathy 87/68/1157   Hallucination 10/05/2019   Dehydration 10/05/2019   Dysphagia 10/05/2019   Incontinence of feces 09/29/2019   Urinary incontinence 09/29/2019   Adrenal mass ([place]) 09/02/2019   Decrease in appetite 09/02/2019   Weight loss 09/02/2019   Acute bilateral thoracic back pain 26/20/3559   Metabolic encephalopathy 74/16/3845   Subclinical hyperthyroidism 08/19/2019   Panic attack 08/18/2019   Urinary tract infection without hematuria 08/18/2019   UTI (urinary tract infection) 08/12/2019   Altered mental state 08/12/2019   Grief 08/12/2019   RMSF [facility] spotted fever) 07/27/2019   Anxiety 07/21/2019   Transaminitis 07/21/2019   Abnormal thyroid blood test 07/21/2019   Sleep disturbance 07/21/2019   Hypokalemia 07/21/2019   Leukocytosis 07/21/2019   Tremor 07/21/2019   Vitamin D deficiency 07/21/2019   Palpitation 07/21/2019   Vaginal bleeding 07/08/2018   Encounter for hepatitis C screening test for low risk patient 07/08/2018   Chronic nausea 07/08/2018   Screening for cervical cancer 07/08/2018   Vaginal mass 07/08/2018   Gastroesophageal reflux disease 07/08/2018   Vaccine counseling 07/08/2018   Estrogen deficiency 07/08/2018   Post-menopausal 07/08/2018   Need for pneumococcal vaccination 07/08/2018   Past Medical History:  Past Medical History:  Diagnosis Date   Colon polyp 2015   Elevated serum free T4 level    husband report   Farsightedness    wears glasses   Mood change 2021   major change in affect, health issues with multiple ED visits, hospitalization, starting 07/10/2019   RMSF [facility] spotted fever) 2021   Urinary tract  infection    Past Surgical History:  Past Surgical History:  Procedure Laterality Date   BREAST REDUCTION SURGERY     CARPAL TUNNEL RELEASE     right   COLONOSCOPY  [address]   [place], [place]   lichenoid keratosis  09/05/11   Skin, left anterior shouldee   PARTIAL HYSTERECTOMY     still has ovaries   WISDOM TOOTH EXTRACTION     HPI:  69 yo female with onset of Parkinson's like symptoms was admitted to ED, with recurrent changes of metabolic encephalopathy, AMS, panic attacks, and tremors. Pt is rapidly declining in her mobility to approach dependent care.  Neurologists are working to diagnose her condition. PMHx:  [place] fever, UTI, incontinence, colon polyp, mood changes, hyperthyroidism, anxiety,   Assessment / Plan / Recommendation Clinical Impression  Pt was seen for SLE and presents with severe cognitive-linguistic impairment as characterized by an inability to attend to conversation and independently communicate wants/needs. Her husband reports that she was fully functioning independently until approximately 07/2019, when her cognition began to rapidly decline. SLP administered the Cognistat and the pt scored 44/76 (constructional ability not assessed and comprehension assessed using WAB). Pt was oriented to person and place, but disoriented to time. Despite performing well on the attention section of the Cognistat, she demonstrated difficulty attending to tasks at times as she became more anxious. Her memory was severely impaired as characterized by a storage deficit and decreased recall of new information. She also demonstrated severe impairment of executive functions like sequencing, reasoning, and judgement. Naming, repetition, and auditory comprehension of yes/no questions were WNL for the tasks assessed.  She demonstrated hesitant, groping, non-fluent speech as characterized by frequent whole word and part word repetitions. Her intelligibility is reduced due to low vocal intensity,  suspected due to inadequate respiration. Recommend treatment to increase speech intelligibility at the phrase level. Consider RMT given pt's demonstration of inadequate respiration for spontaneous speech. SLP will f/u acutely.     SLP Assessment  SLP Recommendation/Assessment: Patient needs continued Speech Lanaguage Pathology Services SLP Visit Diagnosis: Aphasia (R47.01)    Follow Up Recommendations       Frequency and Duration min 2x/week  2 weeks      SLP Evaluation Cognition  Overall Cognitive Status: Impaired/Different from baseline Arousal/Alertness: Lethargic Attention: Focused;Sustained;Selective Focused Attention: Impaired Focused Attention Impairment: Verbal basic Sustained Attention: Impaired Sustained Attention Impairment: Verbal basic Memory: Impaired Memory Impairment: Storage deficit;Decreased recall of new information Awareness: Impaired Awareness Impairment: Emergent impairment Problem Solving: Impaired Problem Solving Impairment: Verbal complex Executive Function: Reasoning;Sequencing;Decision Making Reasoning: Impaired Reasoning Impairment: Verbal basic;Verbal complex Sequencing: Impaired Sequencing Impairment: Verbal basic;Verbal complex Decision Making: Impaired Decision Making Impairment: Verbal basic;Verbal complex       Comprehension  Auditory Comprehension Overall Auditory Comprehension: Impaired Yes/No Questions: Within Functional Limits Commands: Within Functional Limits Conversation: Simple Interfering Components: Anxiety Visual Recognition/Discrimination Discrimination: Not tested Reading Comprehension Reading Status: Not tested    Expression Expression Primary Mode of Expression: Verbal Verbal Expression Overall Verbal Expression: Impaired Initiation: No impairment Automatic Speech: Name Level of Generative/Spontaneous Verbalization: Word;Phrase Repetition: No impairment Naming: No impairment Interfering Components: Speech  intelligibility   Oral / Motor  Motor Speech Overall Motor Speech: Impaired Respiration: Impaired Level of Impairment: Phrase Phonation: Low vocal intensity Resonance: Within functional limits Articulation: Within functional limitis Intelligibility: Intelligibility reduced Word: 75-100% accurate Phrase: 25-49% accurate Sentence: 25-49% accurate Conversation: 25-49% accurate Motor Planning: Not tested Motor Speech Errors: Groping for words   GO                    Greggory Keen 10/28/2019, 1:43 PM

## 2019-10-28 NOTE — Progress Notes (Signed)
PROGRESS NOTE    Hannah Padilla  AJG:[id number] DOB: October 24, 1950 DOA: 10/20/2019 PCP: Carlena Hurl, PA-C   Brief Narrative:  Patient is a 69 year old female with history of subclinical hyperthyroidism, RMSF, UTIs presented with progressive decline over the last 3 months. Prior to the onset of symptoms patient was healthy and able to complete all of her ADLs without any assistance.  Per son, her PCP had diagnosed her with hyperthyroidism, and was placed on methimazole and propranolol.  Reported that she had been having panic attacks, tremor, intermittent sweating, generalized weakness, falls, difficulty swallowing, hallucinations, insomnia and bowel and urine incontinence. Patient had extensive work-up including lumbar puncture, MRI, pheochromocytoma work-up, RMSF was positive. Patient is currently followed outpatient by Dr. Jannifer Franklin.  She was placed on Valium which helped somewhat with the panic attacks,  Seroquel was attempted however patient became catatonic and this was discontinued after 2 doses. Patient was admitted for further work-up.  Neurology was suspecting CJD remains very possible cause for her rapid neurological decline.  Ordered 24-hour urine collection for heavy metals,  Porphobilinogen, delta alae.  Patient is showing some signs of improvement after IV steroids.  Neurology recommended would hold on IVIG at this point given improvement in her symptoms.  Assessment & Plan:   Principal Problem:   Metabolic encephalopathy Active Problems:   Anxiety   Transaminitis   Subclinical hyperthyroidism   Dysphagia   Acute metabolic encephalopathy   Acute metabolic encephalopathy with relatively rapid and progressive decline. - Significant neurological decline in the last 3 months.  Still unclear etiology. - Neurology following, started on high-dose steroids x 5 days. Completed 10/24/19. - Further work-up by neurology including heavy metals, repeat EEG, delta LA, paraneoplastic panel  -  EEG showed mild to moderate diffuse encephalopathy, nonspecific.    -2 episodes of right hand tremor-like movements were most likely nonepileptic - Patient is showing some signs of improvement, could be after completion of IV steroids. - Neurology was suspecting CJD remains very possible cause for her rapid neurological decline. - Neurology considered IVIG treatment and CT imaging to rule out malignancy. - CT chest abdomen and pelvis negative for primary or metastasis. - would hold on IVIG at this point given improvement in her symptoms.   Subclinical hyperthyroidism -Does not fully explain patient's symptoms with progressive neuro decline.   -Continue Home regimen includes methimazole 5 mg 3 times daily.    Admitting physician, Dr. Fuller Plan discussed with Dr. Delrae Rend. -TSH 0.8, free T4 1.96 (improving from 2.33), total T4 10.5, T3 73    - Dr. Buddy Duty will be able to see her outpatient, will need ambulatory referral to Dr. Buddy Duty- endocrinology at the time of discharge -Continue methimazole and propranolol.   Anxiety, insomnia -Per patient's husband at the bedside, Valium has helped at home, will continue 2.5 mg every 12 hours. - She appeared much calmer now, less tremors.   Transaminitis -LFTs improving.  Dysphagia SLP evaluation completed on 9/16, recommended dysphagia 3 diet with thin liquids   DVT prophylaxis:  Lovenox. Code Status: Full Family Communication:  Husband at bed side.  All questions answered. Disposition Plan:  Status is: Inpatient  Remains inpatient appropriate because:Ongoing diagnostic testing needed not appropriate for outpatient work up   Dispo: The patient is from: Home              Anticipated d/c is to: TBD              Anticipated d/c date is: 3  days              Patient currently is not medically stable to d/c.   Consultants:   Neurology  Procedures:  EEG Antimicrobials: Anti-infectives (From admission, onward)   None      Subjective: Patient was seen and examined at bedside.  Overnight events noted. She appears very improved.  Alert, interactive,  husband at bedside.  She was interacting with the physical therapist. She has mild, tremors noticed today in both hands.  Objective: Vitals:   10/27/19 2310 10/28/19 0312 10/28/19 0820 10/28/19 1138  BP: 132/77 139/86 (!) 133/96 101/71  Pulse: 81 77 78 84  Resp: 17 17 18 18   Temp: 97.9 F (36.6 C) 100.1 F (37.8 C) 98.4 F (36.9 C) 98.2 F (36.8 C)  TempSrc:  Oral Oral Oral  SpO2: 98% 98% 99% 98%    Intake/Output Summary (Last 24 hours) at 10/28/2019 1441 Last data filed at 10/28/2019 0900 Gross per 24 hour  Intake 240 ml  Output 520 ml  Net -280 ml   There were no vitals filed for this visit.  Examination:  General exam: Appears calm and comfortable, interacting with physical therapist. Respiratory system: Clear to auscultation. Respiratory effort normal. Cardiovascular system: S1 & S2 heard, RRR. No JVD, murmurs, rubs, gallops or clicks. No pedal edema. Gastrointestinal system: Abdomen is nondistended, soft and nontender. No organomegaly or masses felt.   Normal bowel sounds heard. Central nervous system: Alert and oriented. No focal neurological deficits. Extremities: No edema, No cyanosis, No clubbing,  Mild tremors noticeable today. Skin: No rashes, lesions or ulcers Psychiatry:  Mood & affect appropriate. Slow in response.    Data Reviewed: I have personally reviewed following labs and imaging studies  CBC: Recent Labs  Lab 10/25/19 0709 10/26/19 0150  WBC 10.6*  --   HGB 15.9* 14.9  HCT 46.5* 44.2  MCV 94.3  --   PLT 314  --    Basic Metabolic Panel: Recent Labs  Lab 10/23/19 0143 10/25/19 0709 10/26/19 0150  NA 142 138 140  K 3.5 3.6 3.8  CL 109 107 109  CO2 20* 20* 24  GLUCOSE 136* 142* 96  BUN 22 22 22   CREATININE 0.70 0.71 0.75  CALCIUM 9.3 9.4 8.9  MG  --  2.5*  --   PHOS  --  2.6  --    GFR: CrCl cannot  be calculated (Unknown ideal weight.). Liver Function Tests: Recent Labs  Lab 10/23/19 0143 10/25/19 0709  AST 31 28  ALT 54* 69*  ALKPHOS 50 53  BILITOT 1.2 1.5*  PROT 5.4* 5.8*  ALBUMIN 3.3* 3.3*   No results for input(s): LIPASE, AMYLASE in the last 168 hours. No results for input(s): AMMONIA in the last 168 hours. Coagulation Profile: No results for input(s): INR, PROTIME in the last 168 hours. Cardiac Enzymes: No results for input(s): CKTOTAL, CKMB, CKMBINDEX, TROPONINI in the last 168 hours. BNP (last 3 results) No results for input(s): PROBNP in the last 8760 hours. HbA1C: No results for input(s): HGBA1C in the last 72 hours. CBG: No results for input(s): GLUCAP in the last 168 hours. Lipid Profile: No results for input(s): CHOL, HDL, LDLCALC, TRIG, CHOLHDL, LDLDIRECT in the last 72 hours. Thyroid Function Tests: No results for input(s): TSH, T4TOTAL, FREET4, T3FREE, THYROIDAB in the last 72 hours. Anemia Panel: Recent Labs    10/25/19 1507  VITAMINB12 124*   Sepsis Labs: No results for input(s): PROCALCITON, LATICACIDVEN in the last 168  hours.  Recent Results (from the past 240 hour(s))  SARS Coronavirus 2 by RT PCR (hospital order, performed in [facility] hospital lab) Nasopharyngeal Nasopharyngeal Swab     Status: None   Collection Time: 10/20/19 10:24 AM   Specimen: Nasopharyngeal Swab  Result Value Ref Range Status   SARS Coronavirus 2 NEGATIVE NEGATIVE Final    Comment: (NOTE) SARS-CoV-2 target nucleic acids are NOT DETECTED.  The SARS-CoV-2 RNA is generally detectable in upper and lower respiratory specimens during the acute phase of infection. The lowest concentration of SARS-CoV-2 viral copies this assay can detect is 250 copies / mL. A negative result does not preclude SARS-CoV-2 infection and should not be used as the sole basis for treatment or other patient management decisions.  A negative result may occur with improper specimen collection /  handling, submission of specimen other than nasopharyngeal swab, presence of viral mutation(s) within the areas targeted by this assay, and inadequate number of viral copies (<250 copies / mL). A negative result must be combined with clinical observations, patient history, and epidemiological information.  Fact Sheet for Patients:   StrictlyIdeas.no  Fact Sheet for Healthcare Providers: BankingDealers.co.za  This test is not yet approved or  cleared by the [place] FDA and has been authorized for detection and/or diagnosis of SARS-CoV-2 by FDA under an Emergency Use Authorization (EUA).  This EUA will remain in effect (meaning this test can be used) for the duration of the COVID-19 declaration under Section 564(b)(1) of the Act, 21 U.S.C. section 360bbb-3(b)(1), unless the authorization is terminated or revoked sooner.  Performed at [facility], [place] [address], [place], [place] [address]      Radiology Studies: No results found.  Scheduled Meds: . carbidopa-levodopa  1 tablet Oral TID  . cyanocobalamin  1,000 mcg Intramuscular Daily  . enoxaparin (LOVENOX) injection  40 mg Subcutaneous Q24H  . methimazole  5 mg Oral TID  . pantoprazole  40 mg Oral Daily  . propranolol ER  60 mg Oral Daily  . sertraline  50 mg Oral Daily  . sodium chloride flush  3 mL Intravenous Q12H   Continuous Infusions:    LOS: 7 days    Time spent:  25 mins.    Shawna Clamp, MD [facility]   If 7PM-7AM, please contact night-coverage

## 2019-10-28 NOTE — Progress Notes (Signed)
Neurology Progress Note   S:// Patient seen and examined. Husband at bedside. Recaptured some of the history.  Previously healthy 69 year old woman with a fine arts degree, retired 5 or 6 years ago, in normal state of health till June 2021 when there was sudden onset of rapidly progressing behavioral changes as documented in the prior neurology notes. Initially admitted for treatment of a UTI. This admission, treated with pulse steroids from 10/20/2019 to 10/24/2019. Started on vitamin B12 repletion and Sinemet on 10/25/2019 with some improvement in symptoms.  Work-up thus far including outpatient work-up Pertinent Labs: TSH WNL Free T4 1.96(high) Ammonia 7/14 - 17 B1 - nml RMSF borderline positive IgG, negative IgM(she completed 10 day course of doxy in July) Lyme - negative ESR 2 Thyroglobulin and Thyroid peroxidase Ab - nml Anti-hu,yo,ri - negative By husbands report, 24 hour urine metanephrines "inconclusive"    CSF:  WBC 3 RBC 29 Protein 38 Glucose 56 OC Bands- negative Paraneoplastic CSF study - negative(did not include LGI1) Neuron Specific Enolase - negative.  Serum anti-LGI1 - negative Serum autoimmune encephalopathy panel - negative incl GAD65 14-3-3eta - negative.   CT chest abdomen pelvis negative     O:// Current vital signs: BP 101/71 (BP Location: Left Arm)    Pulse 84    Temp 98.2 F (36.8 C) (Oral)    Resp 18    SpO2 98%  Vital signs in last 24 hours: Temp:  [97.6 F (36.4 C)-100.1 F (37.8 C)] 98.2 F (36.8 C) (09/22 1138) Pulse Rate:  [75-84] 84 (09/22 1138) Resp:  [16-18] 18 (09/22 1138) BP: (101-139)/(68-96) 101/71 (09/22 1138) SpO2:  [97 %-99 %] 98 % (09/22 1138) General: Awake alert in no distress HEENT: Normocephalic atraumatic Lungs: Clear Cardiovascular: Regular rhythm Neurological exam She is awake, alert, oriented to self.  She told me she is in the hospital but said it was [facility].  Upon  asking her what city she can, she says [place].  Upon asking her what state HTN, she keeps perseverating the word state. Follows some single step commands. Cranial nerves: Pupils equal round react light, extraocular movements with mild upward gaze limitation-unclear if effort or comprehension are playing a role, face symmetric. Motor exam: She does have a resting tremor and this is exacerbated by action and intention with some cogwheeling as well as superimposed paratonia.  She has more increased tone in both her legs with her ankles in plantar flexed position which are easily mobile. Sensory exam: Intact DTRs: 3+ all over with question  possible bilateral Hoffmann's being positive-difficult to ascertain due to the tremor.   Medications  Current Facility-Administered Medications:    acetaminophen (TYLENOL) tablet 650 mg, 650 mg, Oral, Q6H PRN **OR** acetaminophen (TYLENOL) suppository 650 mg, 650 mg, Rectal, Q6H PRN, Smith, Rondell A, MD   albuterol (PROVENTIL) (2.5 MG/3ML) 0.083% nebulizer solution 2.5 mg, 2.5 mg, Nebulization, Q6H PRN, Smith, Rondell A, MD   bisacodyl (DULCOLAX) EC tablet 10 mg, 10 mg, Oral, Daily PRN, Tamala Julian, Rondell A, MD   carbidopa-levodopa (SINEMET IR) 25-100 MG per tablet immediate release 1 tablet, 1 tablet, Oral, TID, Greta Doom, MD, 1 tablet at 10/28/19 1302   cyanocobalamin ((VITAMIN B-12)) injection 1,000 mcg, 1,000 mcg, Intramuscular, Daily, Greta Doom, MD, 1,000 mcg at 10/28/19 1012   diazepam (VALIUM) tablet 2.5 mg, 2.5 mg, Oral, Q12H PRN, Rai, Ripudeep K, MD, 2.5 mg at 10/25/19 0054   enoxaparin (LOVENOX) injection 40 mg, 40 mg, Subcutaneous,  Q24H, Fuller Plan A, MD, 40 mg at 10/27/19 1830   methimazole (TAPAZOLE) tablet 5 mg, 5 mg, Oral, TID, Tamala Julian, Rondell A, MD, 5 mg at 10/28/19 1014   ondansetron (ZOFRAN) tablet 4 mg, 4 mg, Oral, Q6H PRN **OR** ondansetron (ZOFRAN) injection 4 mg, 4 mg, Intravenous, Q6H PRN, Tamala Julian, Rondell  A, MD   pantoprazole (PROTONIX) EC tablet 40 mg, 40 mg, Oral, Daily, Smith, Rondell A, MD, 40 mg at 10/28/19 1012   propranolol ER (INDERAL LA) 24 hr capsule 60 mg, 60 mg, Oral, Daily, Smith, Rondell A, MD, 60 mg at 10/28/19 1012   sertraline (ZOLOFT) tablet 50 mg, 50 mg, Oral, Daily, Smith, Rondell A, MD, 50 mg at 10/28/19 1011   sodium chloride flush (NS) 0.9 % injection 3 mL, 3 mL, Intravenous, Q12H, Smith, Rondell A, MD, 3 mL at 10/28/19 1014 Labs CBC    Component Value Date/Time   WBC 10.6 (H) 10/25/2019 0709   RBC 4.93 10/25/2019 0709   HGB 14.9 10/26/2019 0150   HGB 16.0 (H) 10/05/2019 0936   HCT 44.2 10/26/2019 0150   HCT 45.5 10/05/2019 0936   PLT 314 10/25/2019 0709   PLT 318 10/05/2019 0936   MCV 94.3 10/25/2019 0709   MCV 92 10/05/2019 0936   MCH 32.3 10/25/2019 0709   MCHC 34.2 10/25/2019 0709   RDW 13.9 10/25/2019 0709   RDW 15.2 10/05/2019 0936   LYMPHSABS 1.8 10/05/2019 0936   MONOABS 0.4 08/14/2019 0440   EOSABS 0.0 10/05/2019 0936   BASOSABS 0.0 10/05/2019 0936    CMP     Component Value Date/Time   NA 140 10/26/2019 0150   NA 140 10/05/2019 0936   K 3.8 10/26/2019 0150   CL 109 10/26/2019 0150   CO2 24 10/26/2019 0150   GLUCOSE 96 10/26/2019 0150   BUN 22 10/26/2019 0150   BUN 9 10/05/2019 0936   CREATININE 0.75 10/26/2019 0150   CREATININE 0.58 05/13/2013 1426   CALCIUM 8.9 10/26/2019 0150   CALCIUM 9.8 11/28/2011 0821   PROT 5.8 (L) 10/25/2019 0709   PROT 6.6 10/05/2019 0946   ALBUMIN 3.3 (L) 10/25/2019 0709   ALBUMIN 4.6 10/05/2019 0946   AST 28 10/25/2019 0709   ALT 69 (H) 10/25/2019 0709   ALKPHOS 53 10/25/2019 0709   BILITOT 1.5 (H) 10/25/2019 0709   BILITOT 1.3 (H) 10/05/2019 0946   GFRNONAA >60 10/26/2019 0150   GFRAA >60 10/26/2019 0150    glycosylated hemoglobin  Lipid Panel     Component Value Date/Time   CHOL 271 (H) 07/08/2018 1150   TRIG 110 07/08/2018 1150   HDL 87 07/08/2018 1150   CHOLHDL 3.1 07/08/2018 1150    CHOLHDL 2.2 08/21/2011 0847   VLDL 12 08/21/2011 0847   LDLCALC 162 (H) 07/08/2018 1150   B12 low Hypothyroidism noted  Imaging I have reviewed images in epic and the results pertinent to this consultation are: MRI brain with without contrast on 10/10/2019 with mild generalized cortical atrophy stable since July 2021 MRI along with scattered T2/FLAIR hyperintense foci predominantly in the subcortical and deep white matter of the hemispheres most consistent with chronic microvascular ischemic change.  This is stable compared to 08/11/2019.  No abnormal enhancement.  Assessment: Ms. Quesada is a 69 year old woman with rapidly progressive dementia of unknown etiology.  According to the husband Sinemet and B12 supplementation seems to be working well and her subjective mental status is improved. She has received 5 days of IV steroids followed by initiation  of Sinemet and B12-unclear which of them has improved her mentation, but that remains a moot point in the absence of a found etiology. Discussions were had about IVIG but patient's family had concerns about family history of hypercoagulability and hypercoagulable panel per hematology has been sent and awaited. At this point, I would continue supplementation with B12 and continue the current doses of Sinemet and reevaluate. Lab work as ordered by Dr. Leonel Ramsay and Dr. Curly Shores is in process and pending.  Recommendations: Continue treatment of hypothyroidism Work-up for B12 deficiency and continued replenishment of B12 Continue Sinemet Outpatient [facility] for movement disorder evaluation. If she has any further decline or has a relapsing course, IVIG can be considered at that time if the hypercoagulable panel comes back unremarkable. .  Amie Portland, MD [facility] Pager: [phone] If 7pm to 7am, please call on call as listed on AMION.

## 2019-10-28 NOTE — Progress Notes (Signed)
Occupational Therapy Treatment Patient Details Name: Hannah Padilla MRN: [id number] DOB: Sep 22, 1950 Today's Date: 10/28/2019    History of present illness 69 yo female with onset of Parkinson's like symptoms was admitted to ED, with recurrent changes of metabolic encephalopathy, AMS, panic attacks, and tremors. Pt is rapidly declining in her mobility to approach dependent care.  Neurologists are working to diagnose her condition. PMHx:  [place]y [place] spotted fever, UTI, dementia, incontinence, colon polyp, mood changes, hyperthyroidism, anxiety,    OT comments  Pt completed PROM and AAROM bil UE this session. Pt without concerns for contractures at this time. Pt with decreased motor planning and demonstrates echolalia to ensure pt heard the instruction. Recommendations remain appropriate. Spouse reports that responses were better this AM compared to PM hours.     Follow Up Recommendations  SNF;Supervision/Assistance - 24 hour    Equipment Recommendations  None recommended by OT    Recommendations for Other Services      Precautions / Restrictions Precautions Precautions: Fall       Mobility Bed Mobility                  Transfers                      Balance                                           ADL either performed or assessed with clinical judgement   ADL                                               Vision   Additional Comments: pt visually tracking more with central vision. pt does not look up or down. pt tracks laterally only. pt with eye tracking and no neck rotation. pt keeping neck very fixated at neutral.    Perception     Praxis      Cognition Arousal/Alertness: Awake/alert Behavior During Therapy: Flat affect Overall Cognitive Status: Impaired/Different from baseline                                 General Comments: pt followed one step commands intermint this session         Exercises Other Exercises Other Exercises: attempting to (A) patient to very minimally rotate neck lateral and horizontal with gaze stabilization on therapist. Pt tolerated.    Shoulder Instructions       General Comments      Pertinent Vitals/ Pain       Pain Assessment: No/denies pain  Home Living     Available Help at Discharge: Family;Available 24 hours/day Type of Home: House                              Lives With: Spouse    Prior Functioning/Environment              Frequency  Min 2X/week        Progress Toward Goals  OT Goals(current goals can now be found in the care plan section)  Progress towards OT goals: Progressing toward goals  Acute Rehab OT Goals  Patient Stated Goal: per husband to find out what is wrong OT Goal Formulation: With patient Time For Goal Achievement: 11/04/19 Potential to Achieve Goals: Fair ADL Goals Pt Will Perform Eating: with mod assist;sitting Pt Will Perform Grooming: with mod assist;sitting Pt Will Perform Upper Body Bathing: with mod assist;sitting Pt Will Transfer to Toilet: with mod assist;bedside commode Additional ADL Goal #1: Pt will demosntrate sustained attention during ADL task with minimal redirectional cues in nondistracting environment  Plan Discharge plan remains appropriate    Co-evaluation                 AM-PAC OT "6 Clicks" Daily Activity     Outcome Measure   Help from another person eating meals?: Total Help from another person taking care of personal grooming?: Total Help from another person toileting, which includes using toliet, bedpan, or urinal?: Total Help from another person bathing (including washing, rinsing, drying)?: Total Help from another person to put on and taking off regular upper body clothing?: Total Help from another person to put on and taking off regular lower body clothing?: Total 6 Click Score: 6    End of Session    OT Visit Diagnosis: Unsteadiness  on feet (R26.81);Other abnormalities of gait and mobility (R26.89);Muscle weakness (generalized) (M62.81);Other symptoms and signs involving cognitive function   Activity Tolerance Patient tolerated treatment well   Patient Left in bed;with family/visitor present   Nurse Communication Mobility status        Time: 9675-9163 OT Time Calculation (min): 16 min  Charges: OT General Charges $OT Visit: 1 Visit OT Treatments $Therapeutic Activity: 8-22 mins   Brynn, OTR/L  Acute Rehabilitation Services Pager: [phone] Office: [phone] .    Jeri Modena 10/28/2019, 3:53 PM

## 2019-10-29 LAB — CBC
HCT: 45.8 % (ref 36.0–46.0)
Hemoglobin: 15.2 g/dL — ABNORMAL HIGH (ref 12.0–15.0)
MCH: 31.6 pg (ref 26.0–34.0)
MCHC: 33.2 g/dL (ref 30.0–36.0)
MCV: 95.2 fL (ref 80.0–100.0)
Platelets: 238 10*3/uL (ref 150–400)
RBC: 4.81 MIL/uL (ref 3.87–5.11)
RDW: 14.1 % (ref 11.5–15.5)
WBC: 11.5 10*3/uL — ABNORMAL HIGH (ref 4.0–10.5)
nRBC: 0 % (ref 0.0–0.2)

## 2019-10-29 LAB — BASIC METABOLIC PANEL
Anion gap: 11 (ref 5–15)
BUN: 20 mg/dL (ref 8–23)
CO2: 20 mmol/L — ABNORMAL LOW (ref 22–32)
Calcium: 8.9 mg/dL (ref 8.9–10.3)
Chloride: 108 mmol/L (ref 98–111)
Creatinine, Ser: 0.59 mg/dL (ref 0.44–1.00)
GFR calc Af Amer: >60 mL/min (ref >60)
GFR calc non Af Amer: >60 mL/min (ref >60)
Glucose, Bld: 100 mg/dL — ABNORMAL HIGH (ref 70–99)
Potassium: 3.8 mmol/L (ref 3.5–5.1)
Sodium: 139 mmol/L (ref 135–145)

## 2019-10-29 LAB — PHOSPHORUS: Phosphorus: 3.5 mg/dL (ref 2.5–4.6)

## 2019-10-29 LAB — BETA-2-GLYCOPROTEIN I ABS, IGG/M/A
Beta-2 Glyco I IgG: <9 GPI IgG units (ref 0–20)
Beta-2-Glycoprotein I IgA: <9 GPI IgA units (ref 0–25)
Beta-2-Glycoprotein I IgM: <9 GPI IgM units (ref 0–32)

## 2019-10-29 LAB — MAGNESIUM: Magnesium: 2.4 mg/dL (ref 1.7–2.4)

## 2019-10-29 LAB — ANTINUCLEAR ANTIBODIES, IFA: ANA Ab, IFA: NEGATIVE

## 2019-10-29 LAB — FACTOR 5 LEIDEN

## 2019-10-29 LAB — PROTHROMBIN GENE MUTATION

## 2019-10-29 NOTE — Progress Notes (Signed)
PROGRESS NOTE    Hannah Padilla  HER:[id number] DOB: 1950/12/05 DOA: 10/20/2019 PCP: Carlena Hurl, PA-C   Brief Narrative:  Patient is a 69 year old female with history of subclinical hyperthyroidism, RMSF, UTIs presented with progressive decline over the last 3 months. Prior to the onset of symptoms patient was healthy and able to complete all of her ADLs without any assistance.  Per son, her PCP had diagnosed her with hyperthyroidism, and was placed on methimazole and propranolol.  Reported that she had been having panic attacks, tremor, intermittent sweating, generalized weakness, falls, difficulty swallowing, hallucinations, insomnia and bowel and urine incontinence. Patient had extensive work-up including lumbar puncture, MRI, pheochromocytoma work-up, RMSF was positive. Patient is currently followed outpatient by Dr. Jannifer Franklin.  She was placed on Valium which helped somewhat with the panic attacks,  Seroquel was attempted however patient became catatonic and this was discontinued after 2 doses. Patient was admitted for further work-up.  Neurology was suspecting CJD remains very possible cause for her rapid neurological decline.  Ordered 24-hour urine collection for heavy metals,  Porphobilinogen, delta alae.  Patient is showing some signs of improvement after IV steroids, husband reports improvement with sinemet and B12 supplemntation.  Neurology recommended would hold on IVIG at this point given improvement in her symptoms.  Assessment & Plan:   Principal Problem:   Metabolic encephalopathy Active Problems:   Anxiety   Transaminitis   Subclinical hyperthyroidism   Dysphagia   Acute metabolic encephalopathy   Acute metabolic encephalopathy with relatively rapid and progressive decline. - Significant neurological decline in the last 3 months.  Still unclear etiology. - Neurology following, started on high-dose steroids x 5 days. Completed 10/24/19. - Further work-up by neurology  including heavy metals, repeat EEG, delta LA, paraneoplastic panel  - EEG showed mild to moderate diffuse encephalopathy, nonspecific.    -2 episodes of right hand tremor-like movements were most likely nonepileptic - Patient is showing some signs of improvement, could be after completion of IV steroids. - Neurology was suspecting CJD remains very possible cause for her rapid neurological decline. - Neurology considered IVIG treatment and CT imaging to rule out malignancy. - CT chest abdomen and pelvis negative for primary or metastasis. - would hold on IVIG at this point given improvement in her symptoms. - Hypercoagable workup is sent, results pending. - Husband reports improvement with sinemet and B12 supplemntation.    Subclinical hyperthyroidism -Does not fully explain patient's symptoms with progressive neuro decline.   -Continue Home regimen includes methimazole 5 mg 3 times daily.    Admitting physician, Dr. Fuller Plan discussed with Dr. Delrae Rend. -TSH 0.8, free T4 1.96 (improving from 2.33), total T4 10.5, T3 73    - Dr. Buddy Duty will be able to see her outpatient, will need ambulatory referral to Dr. Buddy Duty- endocrinology at the time of discharge -Continue methimazole and propranolol.   Anxiety, insomnia -Per patient's husband at the bedside, Valium has helped at home, will continue 2.5 mg every 12 hours. - She appeared much calmer now, less tremors.   Transaminitis -LFTs improving.  Dysphagia SLP evaluation completed on 9/16, recommended dysphagia 3 diet with thin liquids   DVT prophylaxis:  Lovenox. Code Status: Full Family Communication:  Husband at bed side.  All questions answered. Disposition Plan:  Status is: Inpatient  Remains inpatient appropriate because:Ongoing diagnostic testing needed not appropriate for outpatient work up   Dispo: The patient is from: Home  Anticipated d/c is to: SNF              Anticipated d/c date is: 3 days               Patient currently is not medically stable to d/c.   Consultants:   Neurology  Procedures:  EEG Antimicrobials: Anti-infectives (From admission, onward)   None     Subjective: Patient was seen and examined at bedside.  Overnight events noted. She was sleeping, husband was doing foot massage, She has mild, tremors noticed today in both hands. Husband reports improvement with sinemet and B12 supplemntation.    Objective: Vitals:   10/28/19 2326 10/29/19 0429 10/29/19 0700 10/29/19 1125  BP: 118/78 109/71 104/73 102/65  Pulse: 81 72 76 91  Resp: 18 17 14 18   Temp: 98.8 F (37.1 C) 100 F (37.8 C) 97.6 F (36.4 C) 97.8 F (36.6 C)  TempSrc: Oral Oral Oral Oral  SpO2: 97% 99% 100% 95%    Intake/Output Summary (Last 24 hours) at 10/29/2019 1401 Last data filed at 10/29/2019 1205 Gross per 24 hour  Intake 931 ml  Output 520 ml  Net 411 ml   There were no vitals filed for this visit.  Examination:  General exam: Appears calm and comfortable, sleeping, No distress noted. Respiratory system: Clear to auscultation. Respiratory effort normal. Cardiovascular system: S1 & S2 heard, RRR. No JVD, murmurs, rubs, gallops or clicks. No pedal edema. Gastrointestinal system: Abdomen is nondistended, soft and nontender. No organomegaly or masses felt.   Normal bowel sounds heard. Central nervous system: Alert and oriented. No focal neurological deficits. Extremities: No edema, No cyanosis, No clubbing,  Mild tremors noticeable today. Skin: No rashes, lesions or ulcers Psychiatry:  Mood & affect appropriate.     Data Reviewed: I have personally reviewed following labs and imaging studies  CBC: Recent Labs  Lab 10/25/19 0709 10/26/19 0150 10/29/19 0421  WBC 10.6*  --  11.5*  HGB 15.9* 14.9 15.2*  HCT 46.5* 44.2 45.8  MCV 94.3  --  95.2  PLT 314  --  841   Basic Metabolic Panel: Recent Labs  Lab 10/23/19 0143 10/25/19 0709 10/26/19 0150 10/29/19 0421  NA 142  138 140 139  K 3.5 3.6 3.8 3.8  CL 109 107 109 108  CO2 20* 20* 24 20*  GLUCOSE 136* 142* 96 100*  BUN 22 22 22 20   CREATININE 0.70 0.71 0.75 0.59  CALCIUM 9.3 9.4 8.9 8.9  MG  --  2.5*  --  2.4  PHOS  --  2.6  --  3.5   GFR: CrCl cannot be calculated (Unknown ideal weight.). Liver Function Tests: Recent Labs  Lab 10/23/19 0143 10/25/19 0709  AST 31 28  ALT 54* 69*  ALKPHOS 50 53  BILITOT 1.2 1.5*  PROT 5.4* 5.8*  ALBUMIN 3.3* 3.3*   No results for input(s): LIPASE, AMYLASE in the last 168 hours. No results for input(s): AMMONIA in the last 168 hours. Coagulation Profile: No results for input(s): INR, PROTIME in the last 168 hours. Cardiac Enzymes: No results for input(s): CKTOTAL, CKMB, CKMBINDEX, TROPONINI in the last 168 hours. BNP (last 3 results) No results for input(s): PROBNP in the last 8760 hours. HbA1C: No results for input(s): HGBA1C in the last 72 hours. CBG: No results for input(s): GLUCAP in the last 168 hours. Lipid Profile: No results for input(s): CHOL, HDL, LDLCALC, TRIG, CHOLHDL, LDLDIRECT in the last 72 hours. Thyroid Function Tests: No results  for input(s): TSH, T4TOTAL, FREET4, T3FREE, THYROIDAB in the last 72 hours. Anemia Panel: No results for input(s): VITAMINB12, FOLATE, FERRITIN, TIBC, IRON, RETICCTPCT in the last 72 hours. Sepsis Labs: No results for input(s): PROCALCITON, LATICACIDVEN in the last 168 hours.  Recent Results (from the past 240 hour(s))  SARS Coronavirus 2 by RT PCR (hospital order, performed in [facility] hospital lab) Nasopharyngeal Nasopharyngeal Swab     Status: None   Collection Time: 10/20/19 10:24 AM   Specimen: Nasopharyngeal Swab  Result Value Ref Range Status   SARS Coronavirus 2 NEGATIVE NEGATIVE Final    Comment: (NOTE) SARS-CoV-2 target nucleic acids are NOT DETECTED.  The SARS-CoV-2 RNA is generally detectable in upper and lower respiratory specimens during the acute phase of infection. The  lowest concentration of SARS-CoV-2 viral copies this assay can detect is 250 copies / mL. A negative result does not preclude SARS-CoV-2 infection and should not be used as the sole basis for treatment or other patient management decisions.  A negative result may occur with improper specimen collection / handling, submission of specimen other than nasopharyngeal swab, presence of viral mutation(s) within the areas targeted by this assay, and inadequate number of viral copies (<250 copies / mL). A negative result must be combined with clinical observations, patient history, and epidemiological information.  Fact Sheet for Patients:   StrictlyIdeas.no  Fact Sheet for Healthcare Providers: BankingDealers.co.za  This test is not yet approved or  cleared by the [place] FDA and has been authorized for detection and/or diagnosis of SARS-CoV-2 by FDA under an Emergency Use Authorization (EUA).  This EUA will remain in effect (meaning this test can be used) for the duration of the COVID-19 declaration under Section 564(b)(1) of the Act, 21 U.S.C. section 360bbb-3(b)(1), unless the authorization is terminated or revoked sooner.  Performed at [facility], [place] [address], [place], [place] [address]      Radiology Studies: No results found.  Scheduled Meds: . carbidopa-levodopa  1 tablet Oral TID  . cyanocobalamin  1,000 mcg Intramuscular Daily  . enoxaparin (LOVENOX) injection  40 mg Subcutaneous Q24H  . methimazole  5 mg Oral TID  . pantoprazole  40 mg Oral Daily  . propranolol ER  60 mg Oral Daily  . sertraline  50 mg Oral Daily  . sodium chloride flush  3 mL Intravenous Q12H   Continuous Infusions:    LOS: 8 days    Time spent:  25 mins.    Shawna Clamp, MD [facility]   If 7PM-7AM, please contact night-coverage

## 2019-10-29 NOTE — Consult Note (Signed)
   [facility] Inpatient Consult   10/29/2019  [name] [birth year] [id number]   [facility] [[facility]] Patient:  Hannah Padilla is PCP  Insurance:  [name]  Patient screened for high risk score for unplanned readmission score and for hospitalizations to check if potential Index Management service needs.  Review of patient's medical record reveals patient is being recommended for 24 hour supervision/SNF per therapy notes.  Primary Care Provider is [facility] is  listed to provide the transition of care [TOC] for post hospital follow up.  Plan: Continue to follow progress and disposition to assess for post hospital care management needs.    Please place a [facility] Care Management consult as appropriate and for questions contact:   Natividad Brood, RN BSN [facility] Liaison  [phone] business mobile phone Toll free office [phone]  Fax number: [phone] [email or web address]

## 2019-10-29 NOTE — Care Management Important Message (Signed)
Important Message  Patient Details  Name: Hannah Padilla MRN: [id number] Date of Birth: 09-12-1950   Medicare Important Message Given:  Yes     Orbie Pyo 10/29/2019, 3:13 PM

## 2019-10-29 NOTE — Progress Notes (Signed)
Physical Therapy Treatment Patient Details Name: Hannah Padilla MRN: [id number] DOB: 19-Jan-1951 Today's Date: 10/29/2019    History of Present Illness 69 yo female with onset of Parkinson's like symptoms was admitted to ED, with recurrent changes of metabolic encephalopathy, AMS, panic attacks, and tremors. Pt is rapidly declining in her mobility to approach dependent care.  Neurologists are working to diagnose her condition. PMHx:  Rocky mountain spotted fever, UTI, dementia, incontinence, colon polyp, mood changes, hyperthyroidism, anxiety,     PT Comments    Pt was seen for mobilty to transfer to the chair, but with husband standing by for safety.  Her ability to shuffle step is a dramatic improvement, and demonstrates better functional potential for SNF.  Will focus on LE strength and balance, and will progress to gait as she can tolerate and agree to do.   Follow Up Recommendations  SNF     Equipment Recommendations  None recommended by PT    Recommendations for Other Services       Precautions / Restrictions Precautions Precautions: Fall Precaution Comments: Tremors noted in all extremities Restrictions Weight Bearing Restrictions: No    Mobility  Bed Mobility Overal bed mobility: Needs Assistance Bed Mobility: Rolling;Sidelying to Sit Rolling: Max assist Sidelying to sit: Max assist          Transfers Overall transfer level: Needs assistance Equipment used: 1 person hand held assist Transfers: Sit to/from Stand Sit to Stand: Max assist;From elevated surface         General transfer comment: pt is assisting with legs and helping to shuffle feet  Ambulation/Gait             General Gait Details: unable   Stairs             Wheelchair Mobility    Modified Rankin (Stroke Patients Only)       Balance     Sitting balance-Leahy Scale: Poor       Standing balance-Leahy Scale: Poor                              Cognition  Arousal/Alertness: Awake/alert Behavior During Therapy: Flat affect Overall Cognitive Status: Impaired/Different from baseline Area of Impairment: Attention;Memory;Following commands;Safety/judgement;Awareness;Problem solving                 Orientation Level: Situation;Time Current Attention Level: Selective Memory: Decreased recall of precautions;Decreased short-term memory Following Commands: Follows one step commands inconsistently Safety/Judgement: Decreased awareness of safety;Decreased awareness of deficits Awareness: Intellectual Problem Solving: Slow processing;Requires verbal cues;Requires tactile cues General Comments: repeated instructions to manage cognition      Exercises General Exercises - Lower Extremity Long Arc Quad: AAROM;10 reps Heel Slides: AAROM;10 reps    General Comments        Pertinent Vitals/Pain Pain Assessment: Faces Faces Pain Scale: No hurt    Home Living                      Prior Function            PT Goals (current goals can now be found in the care plan section) Acute Rehab PT Goals Patient Stated Goal: to get home wiht husband Progress towards PT goals: Progressing toward goals    Frequency    Min 2X/week      PT Plan Current plan remains appropriate    Co-evaluation  AM-PAC PT "6 Clicks" Mobility   Outcome Measure  Help needed turning from your back to your side while in a flat bed without using bedrails?: A Lot Help needed moving from lying on your back to sitting on the side of a flat bed without using bedrails?: A Lot Help needed moving to and from a bed to a chair (including a wheelchair)?: A Lot Help needed standing up from a chair using your arms (e.g., wheelchair or bedside chair)?: A Lot Help needed to walk in hospital room?: Total Help needed climbing 3-5 steps with a railing? : Total 6 Click Score: 10    End of Session Equipment Utilized During Treatment: Gait  belt Activity Tolerance: Patient tolerated treatment well Patient left: in bed;with call bell/phone within reach;with family/visitor present;with bed alarm set Nurse Communication: Mobility status;Other (comment) PT Visit Diagnosis: Unsteadiness on feet (R26.81);Muscle weakness (generalized) (M62.81);History of falling (Z91.81);Other abnormalities of gait and mobility (R26.89);Other symptoms and signs involving the nervous system (R29.898)     Time: 5374-8270 PT Time Calculation (min) (ACUTE ONLY): 15 min  Charges:  $Therapeutic Activity: 8-22 mins                 Ramond Dial 10/29/2019, 3:55 PM   Mee Hives, PT MS Acute Rehab Dept. Number: [place] and [place]

## 2019-10-30 NOTE — Progress Notes (Signed)
Physical Therapy Treatment Patient Details Name: Hannah Padilla MRN: [id number] DOB: Aug 01, 1950 Today's Date: 10/30/2019    History of Present Illness 69 yo female with onset of Parkinson's like symptoms was admitted to ED, with recurrent changes of metabolic encephalopathy, AMS, panic attacks, and tremors. Pt is rapidly declining in her mobility to approach dependent care.  Neurologists are working to diagnose her condition. PMHx:  Rocky mountain spotted fever, UTI, dementia, incontinence, colon polyp, mood changes, hyperthyroidism, anxiety,     PT Comments    Pt was seen with husband in attendance, did discuss the feasibility of CIR.  Per CIR admissions group, pt may become appropriate but currently is too limited for a three hour a day rehab program.  Her progress in helping to get up in chair and sitting balance control is improved, but overall endurance is limited.  Focus on LE strength, tolerance for balance sitting and quality of transfers to make pt more independent and able to be returned to home.  Follow Up Recommendations  SNF vs CIR     Equipment Recommendations  None recommended by PT    Recommendations for Other Services       Precautions / Restrictions Precautions Precautions: Fall Precaution Comments: tone and tremors Restrictions Weight Bearing Restrictions: No    Mobility  Bed Mobility Overal bed mobility: Needs Assistance Bed Mobility: Rolling;Sidelying to Sit Rolling: Mod assist Sidelying to sit: Max assist          Transfers Overall transfer level: Needs assistance Equipment used: 1 person hand held assist Transfers: Sit to/from Stand;Stand Pivot Transfers Sit to Stand: Max assist;From elevated surface Stand pivot transfers: Max assist;From elevated surface          Ambulation/Gait             General Gait Details: unable   Chief Strategy Officer    Modified Rankin (Stroke Patients Only)       Balance      Sitting balance-Leahy Scale: Poor       Standing balance-Leahy Scale: Poor                              Cognition Arousal/Alertness: Awake/alert Behavior During Therapy: Flat affect Overall Cognitive Status: Impaired/Different from baseline Area of Impairment: Orientation;Attention;Following commands;Safety/judgement;Awareness;Problem solving                 Orientation Level: Situation;Time Current Attention Level: Selective   Following Commands: Follows one step commands with increased time;Follows one step commands inconsistently Safety/Judgement: Decreased awareness of deficits Awareness: Intellectual Problem Solving: Slow processing;Requires verbal cues;Requires tactile cues General Comments: pt responds to setting up expectations with cues      Exercises General Exercises - Lower Extremity Long Arc Quad: AAROM;10 reps Heel Slides: AAROM;10 reps Hip ABduction/ADduction: AAROM;10 reps Straight Leg Raises: AAROM;10 reps    General Comments        Pertinent Vitals/Pain Pain Assessment: Faces Faces Pain Scale: No hurt    Home Living                      Prior Function            PT Goals (current goals can now be found in the care plan section) Acute Rehab PT Goals Patient Stated Goal: to get home wiht husband    Frequency    Min 2X/week  PT Plan Current plan remains appropriate    Co-evaluation              AM-PAC PT "6 Clicks" Mobility   Outcome Measure  Help needed turning from your back to your side while in a flat bed without using bedrails?: A Lot Help needed moving from lying on your back to sitting on the side of a flat bed without using bedrails?: A Lot Help needed moving to and from a bed to a chair (including a wheelchair)?: A Lot Help needed standing up from a chair using your arms (e.g., wheelchair or bedside chair)?: A Lot Help needed to walk in hospital room?: Total Help needed climbing 3-5  steps with a railing? : Total 6 Click Score: 10    End of Session Equipment Utilized During Treatment: Gait belt Activity Tolerance: Patient tolerated treatment well Patient left: with call bell/phone within reach;with family/visitor present;in chair;with chair alarm set Nurse Communication: Mobility status;Other (comment) PT Visit Diagnosis: Unsteadiness on feet (R26.81);Muscle weakness (generalized) (M62.81);History of falling (Z91.81);Other abnormalities of gait and mobility (R26.89);Other symptoms and signs involving the nervous system (R29.898)     Time: 4383-8184 PT Time Calculation (min) (ACUTE ONLY): 47 min  Charges:  $Therapeutic Exercise: 8-22 mins $Therapeutic Activity: 23-37 mins                  Ramond Dial 10/30/2019, 9:55 PM  Mee Hives, PT MS Acute Rehab Dept. Number: [place] and [place]

## 2019-10-30 NOTE — Progress Notes (Signed)
Orthopedic Tech Progress Note Patient Details:  Hannah Padilla 1950/11/07 [id number] RN called and requested patient needs bi prafo boots Ortho Devices Type of Ortho Device: Prafo boot/shoe Ortho Device/Splint Location: BLE Ortho Device/Splint Interventions: Ordered, Application, Adjustment   Post Interventions Patient Tolerated: Well Instructions Provided: Care of device, Adjustment of device, Poper ambulation with device   Johnmichael Melhorn 10/30/2019, 1:05 PM

## 2019-10-30 NOTE — Progress Notes (Signed)
PROGRESS NOTE    Hannah Padilla  JSE:[id number] DOB: 06-10-1950 DOA: 10/20/2019 PCP: Carlena Hurl, PA-C   Brief Narrative:  Patient is a 69 year old female with history of subclinical hyperthyroidism, RMSF, UTIs presented with progressive decline over the last 3 months. Prior to the onset of symptoms patient was healthy and able to complete all of her ADLs without any assistance.  Per son, her PCP had diagnosed her with hyperthyroidism, and was placed on methimazole and propranolol.  Reported that she had been having panic attacks, tremor, intermittent sweating, generalized weakness, falls, difficulty swallowing, hallucinations, insomnia and bowel and urine incontinence. Patient had extensive work-up including lumbar puncture, MRI, pheochromocytoma work-up, RMSF was positive. Patient is currently followed outpatient by Dr. Jannifer Franklin.  She was placed on Valium which helped somewhat with the panic attacks,  Seroquel was attempted however patient became catatonic and this was discontinued after 2 doses. Patient was admitted for further work-up.  Neurology was suspecting CJD remains very possible cause for her rapid neurological decline.  Ordered 24-hour urine collection for heavy metals,  Porphobilinogen, delta alae.  Patient is showing some signs of improvement after IV steroids, husband reports improvement with sinemet and B12 supplemntation.  Neurology recommended would hold on IVIG at this point given improvement in her symptoms.  Assessment & Plan:   Principal Problem:   Metabolic encephalopathy Active Problems:   Anxiety   Transaminitis   Subclinical hyperthyroidism   Dysphagia   Acute metabolic encephalopathy   Acute metabolic encephalopathy with relatively rapid and progressive decline. - Significant neurological decline in the last 3 months.  Still unclear etiology. - Neurology following, started on high-dose steroids x 5 days. Completed 10/24/19. - Further work-up by neurology  including heavy metals, repeat EEG, delta LA, paraneoplastic panel  - EEG showed mild to moderate diffuse encephalopathy, nonspecific.    -2 episodes of right hand tremor-like movements were most likely nonepileptic - Patient is showing some signs of improvement, could be after completion of IV steroids. - Neurology was suspecting CJD remains very possible cause for her rapid neurological decline. - Neurology considered IVIG treatment and CT imaging to rule out malignancy. - CT chest abdomen and pelvis negative for primary or metastasis. - would hold on IVIG at this point given improvement in her symptoms. - Hypercoagable workup is sent, results pending. - Husband reports improvement with sinemet and B12 supplemntation.    Subclinical hyperthyroidism -Does not fully explain patient's symptoms with progressive neuro decline.   -Continue Home regimen includes methimazole 5 mg 3 times daily.    Admitting physician, Dr. Fuller Plan discussed with Dr. Delrae Rend. -TSH 0.8, free T4 1.96 (improving from 2.33), total T4 10.5, T3 73    - Dr. Buddy Duty will be able to see her outpatient, will need ambulatory referral to Dr. Buddy Duty- endocrinology at the time of discharge -Continue methimazole and propranolol.   Anxiety, insomnia -  Per patient's husband at the bedside, Valium has helped at home, will continue 2.5 mg every 12 hours. - She appeared much calmer now, less tremors.   Transaminitis -LFTs improving.  Dysphagia SLP evaluation completed on 9/16, recommended dysphagia 3 diet with thin liquids   DVT prophylaxis:  Lovenox. Code Status: Full Family Communication:  Husband at bed side.  All questions answered. Disposition Plan:  Status is: Inpatient  Remains inpatient appropriate because:Ongoing diagnostic testing needed not appropriate for outpatient work up   Dispo: The patient is from: Home  Anticipated d/c is to: SNF              Anticipated d/c date is: 3 days               Patient currently is not medically stable to d/c.   Husband is concerned about her discharge to a skilled nursing facility where he does not have visitation rights.  States that might make her condition worse.  He is requesting if we can find a place where he will have visitation rights or patient needs inpatient rehab where he will be able to stay with the patient which will improve her overall condition.  Rehab consulted.  Consultants:   Neurology  Procedures:  EEG Antimicrobials: Anti-infectives (From admission, onward)   None     Subjective: Patient was seen and examined at bedside.  Overnight events noted. She was very alert, awake oriented x2.  Lying comfortably,  watching television with her husband. Tremors not noticed today.  Husband is concerned about her discharge to nursing home where he has no visitation rights.  Objective: Vitals:   10/29/19 2351 10/30/19 0406 10/30/19 0740 10/30/19 1233  BP: 119/72 (!) 152/93 123/78 111/76  Pulse: 74 84 82 86  Resp: 17 17 18 18   Temp: 98.4 F (36.9 C) 98.2 F (36.8 C) 98.4 F (36.9 C) 97.9 F (36.6 C)  TempSrc: Oral Oral Oral Oral  SpO2: 98% 96% 98% 97%    Intake/Output Summary (Last 24 hours) at 10/30/2019 1503 Last data filed at 10/29/2019 2153 Gross per 24 hour  Intake 627 ml  Output --  Net 627 ml   There were no vitals filed for this visit.  Examination:  General exam: Appears calm and comfortable, No distress noted. Respiratory system: Clear to auscultation. Respiratory effort normal. Cardiovascular system: S1 & S2 heard, RRR. No JVD, murmurs, rubs, gallops or clicks. No pedal edema. Gastrointestinal system: Abdomen is nondistended, soft and nontender. No organomegaly or masses felt.   Normal bowel sounds heard. Central nervous system: Alert and oriented. No focal neurological deficits. Extremities: No edema, No cyanosis, No clubbing,  Mild tremors noticeable today. Skin: No rashes, lesions or  ulcers Psychiatry:  Mood & affect appropriate.     Data Reviewed: I have personally reviewed following labs and imaging studies  CBC: Recent Labs  Lab 10/25/19 0709 10/26/19 0150 10/29/19 0421  WBC 10.6*  --  11.5*  HGB 15.9* 14.9 15.2*  HCT 46.5* 44.2 45.8  MCV 94.3  --  95.2  PLT 314  --  008   Basic Metabolic Panel: Recent Labs  Lab 10/25/19 0709 10/26/19 0150 10/29/19 0421  NA 138 140 139  K 3.6 3.8 3.8  CL 107 109 108  CO2 20* 24 20*  GLUCOSE 142* 96 100*  BUN 22 22 20   CREATININE 0.71 0.75 0.59  CALCIUM 9.4 8.9 8.9  MG 2.5*  --  2.4  PHOS 2.6  --  3.5   GFR: CrCl cannot be calculated (Unknown ideal weight.). Liver Function Tests: Recent Labs  Lab 10/25/19 0709  AST 28  ALT 69*  ALKPHOS 53  BILITOT 1.5*  PROT 5.8*  ALBUMIN 3.3*   No results for input(s): LIPASE, AMYLASE in the last 168 hours. No results for input(s): AMMONIA in the last 168 hours. Coagulation Profile: No results for input(s): INR, PROTIME in the last 168 hours. Cardiac Enzymes: No results for input(s): CKTOTAL, CKMB, CKMBINDEX, TROPONINI in the last 168 hours. BNP (last 3 results) No results for  input(s): PROBNP in the last 8760 hours. HbA1C: No results for input(s): HGBA1C in the last 72 hours. CBG: No results for input(s): GLUCAP in the last 168 hours. Lipid Profile: No results for input(s): CHOL, HDL, LDLCALC, TRIG, CHOLHDL, LDLDIRECT in the last 72 hours. Thyroid Function Tests: No results for input(s): TSH, T4TOTAL, FREET4, T3FREE, THYROIDAB in the last 72 hours. Anemia Panel: No results for input(s): VITAMINB12, FOLATE, FERRITIN, TIBC, IRON, RETICCTPCT in the last 72 hours. Sepsis Labs: No results for input(s): PROCALCITON, LATICACIDVEN in the last 168 hours.  No results found for this or any previous visit (from the past 240 hour(s)).   Radiology Studies: No results found.  Scheduled Meds: . carbidopa-levodopa  1 tablet Oral TID  . cyanocobalamin  1,000 mcg  Intramuscular Daily  . enoxaparin (LOVENOX) injection  40 mg Subcutaneous Q24H  . methimazole  5 mg Oral TID  . pantoprazole  40 mg Oral Daily  . propranolol ER  60 mg Oral Daily  . sertraline  50 mg Oral Daily  . sodium chloride flush  3 mL Intravenous Q12H   Continuous Infusions:    LOS: 9 days    Time spent:  25 mins.    Shawna Clamp, MD [facility]   If 7PM-7AM, please contact night-coverage

## 2019-10-30 NOTE — Progress Notes (Signed)
Neurology Progress Note   S:// Patient seen and examined. Husband at bedside. Recaptured some of the history.  Previously healthy 69 year old woman with a fine arts degree, retired 5 or 6 years ago, in normal state of health till June 2021 when there was sudden onset of rapidly progressing behavioral changes as documented in the prior neurology notes. Initially admitted for treatment of a UTI. This admission, treated with pulse steroids from 10/20/2019 to 10/24/2019. Started on vitamin B12 repletion and Sinemet on 10/25/2019 with some improvement in symptoms.  Work-up thus far including outpatient work-up Pertinent Labs: TSH WNL Free T4 1.96(high) Ammonia 7/14 - 17 B1 - nml RMSF borderline positive IgG, negative IgM(she completed 10 day course of doxy in July) Lyme - negative ESR 2 Thyroglobulin and Thyroid peroxidase Ab - nml Anti-hu,yo,ri - negative By husbands report, 24 hour urine metanephrines "inconclusive"  CSF:  WBC 3 RBC 29 Protein 38 Glucose 56 OC Bands- negative Paraneoplastic CSF study - negative(did not include LGI1) Neuron Specific Enolase - negative.  Serum anti-LGI1 - negative Serum autoimmune encephalopathy panel - negative incl GAD65 14-3-3eta - negative.   CT chest abdomen pelvis negative   O:// Current vital signs: BP 123/78 (BP Location: Left Arm)   Pulse 82   Temp 98.4 F (36.9 C) (Oral)   Resp 18   SpO2 98%  Vital signs in last 24 hours: Temp:  [97.8 F (36.6 C)-98.4 F (36.9 C)] 98.4 F (36.9 C) (09/24 0740) Pulse Rate:  [74-91] 82 (09/24 0740) Resp:  [16-18] 18 (09/24 0740) BP: (102-152)/(62-93) 123/78 (09/24 0740) SpO2:  [95 %-98 %] 98 % (09/24 0740)  General: Awake alert in no distress HEENT: Normocephalic atraumatic Lungs: Clear Cardiovascular: Regular rhythm Neurological exam She is awake, alert, oriented to self.  She told me she is in the hospital but said it was [facility].  Upon asking her  what city she can, she says [place].  Upon asking her what state HTN, she keeps perseverating the word state. Follows some single step commands. Her speech is a mild stuttering quality although she is not grossly dysarthric Cranial nerves: Pupils equal round react light, extraocular movements with mild upward gaze limitation-unclear if effort or comprehension are playing a role, face symmetric. Motor exam: She does have a resting tremor and this is exacerbated by action and intention with some cogwheeling as well as superimposed paratonia.  She has more increased tone in both her legs with her ankles in plantar flexed position which are easily mobile. Sensory exam: Intact DTRs: 3+ all over with question  possible bilateral Hoffmann's being positive-difficult to ascertain due to the tremor.  Medications  Current Facility-Administered Medications:  .  acetaminophen (TYLENOL) tablet 650 mg, 650 mg, Oral, Q6H PRN **OR** acetaminophen (TYLENOL) suppository 650 mg, 650 mg, Rectal, Q6H PRN, Smith, Rondell A, MD .  albuterol (PROVENTIL) (2.5 MG/3ML) 0.083% nebulizer solution 2.5 mg, 2.5 mg, Nebulization, Q6H PRN, Smith, Rondell A, MD .  bisacodyl (DULCOLAX) EC tablet 10 mg, 10 mg, Oral, Daily PRN, Smith, Rondell A, MD .  carbidopa-levodopa (SINEMET IR) 25-100 MG per tablet immediate release 1 tablet, 1 tablet, Oral, TID, Greta Doom, MD, 1 tablet at 10/29/19 1750 .  cyanocobalamin ((VITAMIN B-12)) injection 1,000 mcg, 1,000 mcg, Intramuscular, Daily, Greta Doom, MD, 1,000 mcg at 10/29/19 1031 .  diazepam (VALIUM) tablet 2.5 mg, 2.5 mg, Oral, Q12H PRN, Rai, Ripudeep K, MD, 2.5 mg at 10/29/19 1758 .  enoxaparin (LOVENOX) injection  40 mg, 40 mg, Subcutaneous, Q24H, Smith, Rondell A, MD, 40 mg at 10/29/19 1752 .  methimazole (TAPAZOLE) tablet 5 mg, 5 mg, Oral, TID, Smith, Rondell A, MD, 5 mg at 10/29/19 2200 .  ondansetron (ZOFRAN) tablet 4 mg, 4 mg, Oral, Q6H PRN **OR** ondansetron  (ZOFRAN) injection 4 mg, 4 mg, Intravenous, Q6H PRN, Smith, Rondell A, MD .  pantoprazole (PROTONIX) EC tablet 40 mg, 40 mg, Oral, Daily, Smith, Rondell A, MD, 40 mg at 10/29/19 1029 .  propranolol ER (INDERAL LA) 24 hr capsule 60 mg, 60 mg, Oral, Daily, Smith, Rondell A, MD, 60 mg at 10/29/19 1029 .  sertraline (ZOLOFT) tablet 50 mg, 50 mg, Oral, Daily, Smith, Rondell A, MD, 50 mg at 10/29/19 1028 .  sodium chloride flush (NS) 0.9 % injection 3 mL, 3 mL, Intravenous, Q12H, Smith, Rondell A, MD, 3 mL at 10/29/19 2153 Labs CBC    Component Value Date/Time   WBC 11.5 (H) 10/29/2019 0421   RBC 4.81 10/29/2019 0421   HGB 15.2 (H) 10/29/2019 0421   HGB 16.0 (H) 10/05/2019 0936   HCT 45.8 10/29/2019 0421   HCT 45.5 10/05/2019 0936   PLT 238 10/29/2019 0421   PLT 318 10/05/2019 0936   MCV 95.2 10/29/2019 0421   MCV 92 10/05/2019 0936   MCH 31.6 10/29/2019 0421   MCHC 33.2 10/29/2019 0421   RDW 14.1 10/29/2019 0421   RDW 15.2 10/05/2019 0936   LYMPHSABS 1.8 10/05/2019 0936   MONOABS 0.4 08/14/2019 0440   EOSABS 0.0 10/05/2019 0936   BASOSABS 0.0 10/05/2019 0936    CMP     Component Value Date/Time   NA 139 10/29/2019 0421   NA 140 10/05/2019 0936   K 3.8 10/29/2019 0421   CL 108 10/29/2019 0421   CO2 20 (L) 10/29/2019 0421   GLUCOSE 100 (H) 10/29/2019 0421   BUN 20 10/29/2019 0421   BUN 9 10/05/2019 0936   CREATININE 0.59 10/29/2019 0421   CREATININE 0.58 05/13/2013 1426   CALCIUM 8.9 10/29/2019 0421   CALCIUM 9.8 11/28/2011 0821   PROT 5.8 (L) 10/25/2019 0709   PROT 6.6 10/05/2019 0946   ALBUMIN 3.3 (L) 10/25/2019 0709   ALBUMIN 4.6 10/05/2019 0946   AST 28 10/25/2019 0709   ALT 69 (H) 10/25/2019 0709   ALKPHOS 53 10/25/2019 0709   BILITOT 1.5 (H) 10/25/2019 0709   BILITOT 1.3 (H) 10/05/2019 0946   GFRNONAA >60 10/29/2019 0421   GFRAA >60 10/29/2019 0421    glycosylated hemoglobin  Lipid Panel     Component Value Date/Time   CHOL 271 (H) 07/08/2018 1150   TRIG  110 07/08/2018 1150   HDL 87 07/08/2018 1150   CHOLHDL 3.1 07/08/2018 1150   CHOLHDL 2.2 08/21/2011 0847   VLDL 12 08/21/2011 0847   LDLCALC 162 (H) 07/08/2018 1150   B12 low Hypothyroidism noted  Hypercoagulable panel negative thus far.  Imaging I have reviewed images in epic and the results pertinent to this consultation are: MRI brain with without contrast on 10/10/2019 with mild generalized cortical atrophy stable since July 2021 MRI along with scattered T2/FLAIR hyperintense foci predominantly in the subcortical and deep white matter of the hemispheres most consistent with chronic microvascular ischemic change.  This is stable compared to 08/11/2019.  No abnormal enhancement.  Assessment: Ms. Crocker is a 69 year old woman with rapidly progressive dementia of unknown etiology.  According to the husband Sinemet and B12 supplementation seems to be working well and her subjective mental  status is improved. She has received 5 days of IV steroids followed by initiation of Sinemet and B12-unclear which of them has improved her mentation, but that remains a moot point in the absence of a found etiology. Discussions were had about IVIG but patient's family had concerns about family history of hypercoagulability and hypercoagulable panel per hematology has been sent and awaited. Hypercoagulable work-up essentially negative thus far. At this point, I would continue supplementation with B12 and continue the current doses of Sinemet and reevaluate. Lab work as ordered by Dr. Leonel Ramsay and Dr. Curly Shores is in process and pending.  Recommendations: Continue treatment of hypothyroidism Work-up for B12 deficiency and continued replenishment of B12 Continue Sinemet Outpatient DaTscan for movement disorder evaluation. If she has any further decline or has a relapsing course, IVIG can be considered at that time  Husband has ongoing concerns about her placement in a SNF for CIR where he does not have  visitation privileges because he fears that that were lead to deterioration as he believes that her clinical condition has improved and him being at her bedside keeps her on an even keel.  I have discussed this with the primary hospitalist and further discussions will be continued for placement.  Outpatient follow-up with neurology to continue.  Pending 14-3-3 Case Western panel with RT-QuIC and tau testing - send out test (less likley to be CJD given negative 14-3-3-eta isoform which is not that specific but is usually positive in CGD cases. Marland Kitchen  -- Amie Portland, MD [facility] Pager: [phone] If 7pm to 7am, please call on call as listed on AMION.

## 2019-10-30 NOTE — Progress Notes (Signed)
Inpatient Rehab Admissions:  Inpatient Rehab Consult received.  I met with patient and husband at the bedside for rehabilitation assessment and to discuss goals and expectations of an inpatient rehab admission.  Husband is interested in CIR for pt d/t intensity and allowance of visitors.  However, husband does not feel pt is ready for such a program at this time d/t pt's current cognitive deficits.  He did report pt's progress with PT; this progress is also documented in PT notes. Will continue to monitor pt's progress with therapies as well as medical workup to determine pt appropriateness for potential CIR admission.    Signed: Gayland Curry, [place], [place] Admissions Coordinator [phone]

## 2019-10-30 NOTE — Consult Note (Signed)
Physical Medicine and Rehabilitation Consult Reason for Consult: Decreased functional ability with altered mental status Referring Physician: [facility]   HPI: Hannah Padilla is a 69 y.o. right-handed female with history of hyperthyroidism, recurrent UTIs, [place] spotted fever.  Presented 10/20/2019 with progressive decline with noted apraxia over the last 3 months.  Prior to onset of symptoms patient was healthy able to complete all of her ADLs without assistance.  History was obtained by son.  Most recent MRI showed mild generalized cortical atrophy stable compared to scan of 08/11/2019.  Scattered T2 FLAIR hyperintense foci predominantly in the subcortical and deep white matter of the hemispheres most consistent with chronic microvascular ischemic change.  No acute findings.  CT of chest abdomen pelvis showed no evidence of primary metastatic disease.  Admission chemistries potassium 3.4 total protein 6.4 AST 49 ALT 50, hemoglobin 14.7, SARS coronavirus negative, TSH 0.851, ammonia level 17, ESR of 2, paraneoplastic CSF study negative.  Neurology follow-up suspect progressive dementia of unknown etiology she did receive 5 days of IV steroids followed by initiation of Sinemet for trial.  Dysphagia #3 thin liquid diet.  Subcutaneous Lovenox for DVT prophylaxis.  Therapy evaluations completed with recommendations of physical medicine rehab consult.   Review of Systems  Constitutional: Negative for chills and fever.  HENT: Negative for hearing loss.   Eyes: Negative for blurred vision and double vision.  Respiratory: Negative for cough and shortness of breath.   Cardiovascular: Negative for chest pain and palpitations.  Gastrointestinal: Positive for constipation. Negative for heartburn, nausea and vomiting.  Genitourinary: Negative for dysuria, flank pain and hematuria.  Musculoskeletal: Positive for myalgias.  Skin: Negative for rash.  Neurological:       Apraxia  Psychiatric/Behavioral:  Positive for memory loss. The patient has insomnia.   All other systems reviewed and are negative.  Past Medical History:  Diagnosis Date  . Colon polyp 2015  . Elevated serum free T4 level    husband report  . Farsightedness    wears glasses  . Mood change 2021   major change in affect, health issues with multiple ED visits, hospitalization, starting 07/10/2019  . RMSF [facility] spotted fever) 2021  . Urinary tract infection    Past Surgical History:  Procedure Laterality Date  . BREAST REDUCTION SURGERY    . CARPAL TUNNEL RELEASE     right  . COLONOSCOPY  9563   [place], [place]  . lichenoid keratosis  09/05/11   Skin, left anterior shouldee  . PARTIAL HYSTERECTOMY     still has ovaries  . WISDOM TOOTH EXTRACTION     Family History  Problem Relation Age of Onset  . Alzheimer's disease Mother   . Other Father        cognitive decline  . COPD Father        smoker  . Protein S deficiency Other   . Heart disease Neg Hx   . Diabetes Neg Hx   . Cancer Neg Hx   . Stroke Neg Hx   . Hypertension Neg Hx   . Hyperlipidemia Neg Hx    Social History:  reports that she has never smoked. She has never used smokeless tobacco. She reports previous alcohol use of about 5.0 standard drinks of alcohol per week. She reports that she does not use drugs. Allergies: No Known Allergies Medications Prior to Admission  Medication Sig Dispense Refill  . acetaminophen (TYLENOL) 325 MG tablet Take 2 tablets (650 mg total)  by mouth every 6 (six) hours as needed for mild pain (or Fever >/= 101).    . bisacodyl (DULCOLAX) 5 MG EC tablet Take 10 mg by mouth daily as needed for moderate constipation.    . diazepam (VALIUM) 5 MG tablet Take 1 tablet (5 mg total) by mouth every 12 (twelve) hours as needed for anxiety. (Patient taking differently: Take 2.5 mg by mouth every 12 (twelve) hours as needed for anxiety. ) 20 tablet 0  . megestrol (MEGACE) 40 MG/ML suspension Take 5 mLs (200 mg total) by mouth  daily. (Patient taking differently: Take 400 mg by mouth daily. ) 240 mL 0  . methimazole (TAPAZOLE) 5 MG tablet Take 1 tablet (5 mg total) by mouth 3 (three) times daily. 45 tablet 0  . omeprazole (PRILOSEC) 40 MG capsule Take 1 capsule by mouth once daily (Patient taking differently: Take 40 mg by mouth daily. ) 30 capsule 0  . ondansetron (ZOFRAN-ODT) 8 MG disintegrating tablet Take 1 tablet (8 mg total) by mouth every 8 (eight) hours as needed. (Patient taking differently: Take 8 mg by mouth every 8 (eight) hours as needed for nausea or vomiting. ) 20 tablet 0  . propranolol ER (INDERAL LA) 60 MG 24 hr capsule Take 1 capsule (60 mg total) by mouth daily. 30 capsule 1  . sertraline (ZOLOFT) 50 MG tablet Take 50 mg by mouth daily.    . Vitamin D, Ergocalciferol, (DRISDOL) 1.25 MG (50000 UNIT) CAPS capsule Take 1 capsule (50,000 Units total) by mouth every 7 (seven) days. (Patient taking differently: Take 50,000 Units by mouth every Wednesday. ) 12 capsule 1    Home: Home Living Family/patient expects to be discharged to:: Private residence Living Arrangements: Spouse/significant other Available Help at Discharge: Family, Available 24 hours/day Type of Home: House Home Access: Stairs to enter Home Layout: One level Bathroom Shower/Tub: Tub/shower unit Home Equipment: Bedside commode  Lives With: Spouse  Functional History: Prior Function Level of Independence: Needs assistance Gait / Transfers Assistance Needed: recently has needed help to walk and transfer, gait for very short trips of a few steps ADL's / Homemaking Assistance Needed: husband cares for home, assists wife to dress and bathe Communication / Swallowing Assistance Needed: soft vocal quality; mumbling at times and sometimes coherant Comments: per husband has times wiht blank stairing Functional Status:  Mobility: Bed Mobility Overal bed mobility: Needs Assistance Bed Mobility: Rolling, Sidelying to Sit Rolling: Max  assist Sidelying to sit: Max assist Supine to sit: Max assist, +2 for physical assistance Sit to supine: Max assist, +2 for physical assistance Sit to sidelying: Total assist, +2 for physical assistance General bed mobility comments: rolled to her left Transfers Overall transfer level: Needs assistance Equipment used: 1 person hand held assist Transfers: Sit to/from Stand Sit to Stand: Max assist, From elevated surface General transfer comment: pt is assisting with legs and helping to shuffle feet Ambulation/Gait Ambulation/Gait assistance: Min assist Gait Distance (Feet): 12 Feet (4 x 3') Assistive device: 1 person hand held assist Gait Pattern/deviations: Step-to pattern, Decreased stride length, Wide base of support General Gait Details: unable Gait velocity: reduced Gait velocity interpretation: <1.8 ft/sec, indicate of risk for recurrent falls    ADL: ADL Overall ADL's : Needs assistance/impaired Functional mobility during ADLs: Maximal assistance, +2 for physical assistance General ADL Comments: total A for ADL tasks  Cognition: Cognition Overall Cognitive Status: Impaired/Different from baseline Arousal/Alertness: Lethargic Orientation Level: Oriented to person Attention: Focused, Sustained, Selective Focused Attention: Impaired Focused  Attention Impairment: Verbal basic Sustained Attention: Impaired Sustained Attention Impairment: Verbal basic Memory: Impaired Memory Impairment: Storage deficit, Decreased recall of new information Awareness: Impaired Awareness Impairment: Emergent impairment Problem Solving: Impaired Problem Solving Impairment: Verbal complex Executive Function: Reasoning, Sequencing, Decision Making Reasoning: Impaired Reasoning Impairment: Verbal basic, Verbal complex Sequencing: Impaired Sequencing Impairment: Verbal basic, Verbal complex Decision Making: Impaired Decision Making Impairment: Verbal basic, Verbal  complex Cognition Arousal/Alertness: Awake/alert Behavior During Therapy: Flat affect Overall Cognitive Status: Impaired/Different from baseline Area of Impairment: Attention, Memory, Following commands, Safety/judgement, Awareness, Problem solving Orientation Level: Situation, Time Current Attention Level: Selective Memory: Decreased recall of precautions, Decreased short-term memory Following Commands: Follows one step commands inconsistently Safety/Judgement: Decreased awareness of safety, Decreased awareness of deficits Awareness: Intellectual Problem Solving: Slow processing, Requires verbal cues, Requires tactile cues General Comments: repeated instructions to manage cognition Difficult to assess due to: Impaired communication  Blood pressure 123/78, pulse 82, temperature 98.4 F (36.9 C), temperature source Oral, resp. rate 18, SpO2 98 %. Physical Exam  General: Makes eye contact, No apparent distress HEENT: Head is normocephalic, cannot visually track Neck: Supple without JVD or lymphadenopathy Heart: Reg rate and rhythm. No murmurs rubs or gallops Chest: CTA bilaterally without wheezes, rales, or rhonchi; no distress Abdomen: Soft, non-tender, non-distended, bowel sounds positive. Extremities: No clubbing, cyanosis, or edema. Pulses are 2+ Skin: Clean and intact without signs of breakdown Neuro: Patient is alert.  Makes eye contact with examiner.  Mild stuttering.  Provides her name and place but could not name hospital without cues.  She was able to say she was in [place].  Follow simple one-step commands.  Limited medical historian. Unable to participate in MMT but can squeeze my hand with right hand. +tremor.  Psych: Pt's affect is appropriate. Pt is cooperativ    No results found for this or any previous visit (from the past 24 hour(s)). No results found.   Assessment/Plan: Diagnosis: Impaired cognition and mobility of unknown origin, undergoing workup for  CJD/autoimmune disease 1. Does the need for close, 24 hr/day medical supervision in concert with the patient's rehab needs make it unreasonable for this patient to be served in a less intensive setting? Yes 2. Co-Morbidities requiring supervision/potential complications: hyperthyroidism, recurrent UTIs, [facility] spotted fever, B12 deficiency, tremor 3. Due to bladder management, bowel management, safety, skin/wound care, disease management, medication administration, pain management and patient education, does the patient require 24 hr/day rehab nursing? Yes 4. Does the patient require coordinated care of a physician, rehab nurse, therapy disciplines of PT, OT, SLP to address physical and functional deficits in the context of the above medical diagnosis(es)? Yes Addressing deficits in the following areas: balance, endurance, locomotion, strength, transferring, bowel/bladder control, bathing, dressing, feeding, grooming, toileting, cognition, speech, language, swallowing and psychosocial support 5. Can the patient actively participate in an intensive therapy program of at least 3 hrs of therapy per day at least 5 days per week? Yes 6. The potential for patient to make measurable gains while on inpatient rehab is excellent 7. Anticipated functional outcomes upon discharge from inpatient rehab are min assist  with PT, min assist with OT, min assist with SLP. 8. Estimated rehab length of stay to reach the above functional goals is: 2-3 weeks 9. Anticipated discharge destination: Home 10. Overall Rehab/Functional Prognosis: fair  RECOMMENDATIONS: This patient's condition is appropriate for continued rehabilitative care in the following setting: CIR Patient has agreed to participate in recommended program. Yes Note that insurance prior authorization may be  required for reimbursement for recommended care.  Comment: Thank you for this consult. Admission coordinator to follow.   I have personally  performed a face to face diagnostic evaluation, including, but not limited to relevant history and physical exam findings, of this patient and developed relevant assessment and plan.  Additionally, I have reviewed and concur with the physician assistant's documentation above.  Leeroy Cha, MD  Lavon Paganini [place], PA-C 10/30/2019

## 2019-10-31 NOTE — Progress Notes (Signed)
PROGRESS NOTE    Hannah Padilla  QPR:[id number] DOB: 01/17/1951 DOA: 10/20/2019 PCP: Carlena Hurl, PA-C   Brief Narrative:  Patient is a 69 year old female with history of subclinical hyperthyroidism, RMSF, UTIs presented with progressive decline over the last 3 months. Prior to the onset of symptoms patient was healthy and able to complete all of her ADLs without any assistance.  Per son, her PCP had diagnosed her with hyperthyroidism, and was placed on methimazole and propranolol. Reported that she had been having panic attacks, tremor, intermittent sweating, generalized weakness, falls, difficulty swallowing, hallucinations, insomnia and bowel and urine incontinence. Patient had extensive work-up including lumbar puncture, MRI, pheochromocytoma work-up, RMSF was positive. Patient is currently followed outpatient by Dr. Jannifer Franklin ( outpatient neurologist).  She was placed on Valium which helped somewhat with the panic attacks,  Seroquel was attempted however patient became catatonic and this was discontinued after 2 doses.  Patient was admitted for further work-up.  Neurology was suspecting CJD remains very possible cause for her rapid neurological decline.  Ordered 24-hour urine collection for heavy metals,  Porphobilinogen, delta alae, results pending.  Patient had shown some signs of improvement after IV steroids, husband reports improvement with sinemet and B12 supplemntation.  Neurology recommended would hold on IVIG at this point given improvement in her symptoms. Neurology recommended inpatient rehab.  Assessment & Plan:   Principal Problem:   Metabolic encephalopathy Active Problems:   Anxiety   Transaminitis   Subclinical hyperthyroidism   Dysphagia   Acute metabolic encephalopathy   Acute metabolic encephalopathy with relatively rapid and progressive decline. - Significant neurological decline in the last 3 months.  Still unclear etiology. - Neurology following, started on  high-dose steroids x 5 days. Completed 10/24/19. - Further work-up by neurology including heavy metals, repeat EEG, delta LA, paraneoplastic panel  - EEG showed mild to moderate diffuse encephalopathy, nonspecific.    -2 episodes of right hand tremor-like movements were most likely nonepileptic - Patient has shown some signs of improvement, could be after completion of IV steroids. - Neurology was suspecting CJD remains very possible cause for her rapid neurological decline. - Neurology considered IVIG treatment and CT imaging to rule out malignancy. - CT chest abdomen and pelvis negative for primary or metastasis. - Hold on IVIG at this point given improvement in her symptoms. - Hypercoagable workup is sent, results negative - Husband reports improvement with sinemet and B12 supplementation.  - Awaiting placement in inpatient rehab.  Subclinical hyperthyroidism -Does not fully explain patient's symptoms with progressive neuro decline.   -Continue Home regimen includes methimazole 5 mg 3 times daily.    Admitting physician, Dr. Fuller Plan discussed with Dr. Delrae Rend. -TSH 0.8, free T4 1.96 (improving from 2.33), total T4 10.5, T3 73    - Dr. Buddy Duty will be able to see her outpatient, will need ambulatory referral to Dr. Buddy Duty- endocrinology at the time of discharge -Continue methimazole and propranolol.   Anxiety, insomnia -  Per patient's husband at the bedside, Valium has helped at home, will continue 2.5 mg every 12 hours. - She appeared much calmer now, less tremors.   Transaminitis -LFTs improving.  Dysphagia SLP evaluation completed on 9/16, recommended dysphagia 3 diet with thin liquids   DVT prophylaxis:  Lovenox. Code Status: Full Family Communication:  Husband at bed side.  All questions answered. Disposition Plan:  Status is: Inpatient  Remains inpatient appropriate because:Ongoing diagnostic testing needed not appropriate for outpatient work up   Dispo:  The patient is from: Home              Anticipated d/c is to: SNF              Anticipated d/c date is: 3 days              Patient currently is not medically stable to d/c.   Husband is concerned about her discharge to a skilled nursing facility where he does not have visitation rights due to covid.  States that might make her condition worse.  He is requesting if we can find a place where he will have visitation rights or patient needs inpatient rehab where he will be able to stay with the patient which will improve her overall condition.  Rehab consulted.  Consultants:   Neurology  Procedures:  EEG Antimicrobials: Anti-infectives (From admission, onward)   None     Subjective: Patient was seen and examined at bedside.  Overnight events noted. She was very alert, awake  oriented x 2.  Lying comfortably, but feels sad, states that she is not getting better, wants to go home. Tremors less noticed today.  Objective: Vitals:   10/30/19 2039 10/31/19 0015 10/31/19 0405 10/31/19 0736  BP: 102/61 126/73 107/74 116/70  Pulse: 71 72 62 64  Resp: 16 18 19 18   Temp: 97.8 F (36.6 C) 98.5 F (36.9 C) 97.6 F (36.4 C) 98 F (36.7 C)  TempSrc: Oral Axillary Axillary   SpO2: 97% 100% 100% 100%    Intake/Output Summary (Last 24 hours) at 10/31/2019 1418 Last data filed at 10/30/2019 2123 Gross per 24 hour  Intake 243 ml  Output 300 ml  Net -57 ml   There were no vitals filed for this visit.  Examination:  General exam: Appears calm and comfortable, No distress noted. Respiratory system: Clear to auscultation. Respiratory effort normal. Cardiovascular system: S1 & S2 heard, RRR. No JVD, murmurs, rubs, gallops or clicks. No pedal edema. Gastrointestinal system: Abdomen is nondistended, soft and nontender. No organomegaly or masses felt.   Normal bowel sounds heard. Central nervous system: Alert and oriented x2. No focal neurological deficits. Extremities: No edema, No cyanosis, No  clubbing,  Mild tremors noticeable today. Skin: No rashes, lesions or ulcers Psychiatry:  Mood & affect appropriate.     Data Reviewed: I have personally reviewed following labs and imaging studies  CBC: Recent Labs  Lab 10/25/19 0709 10/26/19 0150 10/29/19 0421  WBC 10.6*  --  11.5*  HGB 15.9* 14.9 15.2*  HCT 46.5* 44.2 45.8  MCV 94.3  --  95.2  PLT 314  --  607   Basic Metabolic Panel: Recent Labs  Lab 10/25/19 0709 10/26/19 0150 10/29/19 0421  NA 138 140 139  K 3.6 3.8 3.8  CL 107 109 108  CO2 20* 24 20*  GLUCOSE 142* 96 100*  BUN 22 22 20   CREATININE 0.71 0.75 0.59  CALCIUM 9.4 8.9 8.9  MG 2.5*  --  2.4  PHOS 2.6  --  3.5   GFR: CrCl cannot be calculated (Unknown ideal weight.). Liver Function Tests: Recent Labs  Lab 10/25/19 0709  AST 28  ALT 69*  ALKPHOS 53  BILITOT 1.5*  PROT 5.8*  ALBUMIN 3.3*   No results for input(s): LIPASE, AMYLASE in the last 168 hours. No results for input(s): AMMONIA in the last 168 hours. Coagulation Profile: No results for input(s): INR, PROTIME in the last 168 hours. Cardiac Enzymes: No results for input(s): CKTOTAL,  CKMB, CKMBINDEX, TROPONINI in the last 168 hours. BNP (last 3 results) No results for input(s): PROBNP in the last 8760 hours. HbA1C: No results for input(s): HGBA1C in the last 72 hours. CBG: No results for input(s): GLUCAP in the last 168 hours. Lipid Profile: No results for input(s): CHOL, HDL, LDLCALC, TRIG, CHOLHDL, LDLDIRECT in the last 72 hours. Thyroid Function Tests: No results for input(s): TSH, T4TOTAL, FREET4, T3FREE, THYROIDAB in the last 72 hours. Anemia Panel: No results for input(s): VITAMINB12, FOLATE, FERRITIN, TIBC, IRON, RETICCTPCT in the last 72 hours. Sepsis Labs: No results for input(s): PROCALCITON, LATICACIDVEN in the last 168 hours.  No results found for this or any previous visit (from the past 240 hour(s)).   Radiology Studies: No results found.  Scheduled Meds: .  carbidopa-levodopa  1 tablet Oral TID  . enoxaparin (LOVENOX) injection  40 mg Subcutaneous Q24H  . methimazole  5 mg Oral TID  . pantoprazole  40 mg Oral Daily  . propranolol ER  60 mg Oral Daily  . sertraline  50 mg Oral Daily  . sodium chloride flush  3 mL Intravenous Q12H   Continuous Infusions:    LOS: 10 days    Time spent:  25 mins.    Shawna Clamp, MD [facility]   If 7PM-7AM, please contact night-coverage

## 2019-11-01 LAB — CBC
HCT: 41.7 % (ref 36.0–46.0)
Hemoglobin: 13.8 g/dL (ref 12.0–15.0)
MCH: 31.7 pg (ref 26.0–34.0)
MCHC: 33.1 g/dL (ref 30.0–36.0)
MCV: 95.6 fL (ref 80.0–100.0)
Platelets: 252 10*3/uL (ref 150–400)
RBC: 4.36 MIL/uL (ref 3.87–5.11)
RDW: 14.2 % (ref 11.5–15.5)
WBC: 8.6 10*3/uL (ref 4.0–10.5)
nRBC: 0 % (ref 0.0–0.2)

## 2019-11-01 LAB — BASIC METABOLIC PANEL
Anion gap: 6 (ref 5–15)
BUN: 14 mg/dL (ref 8–23)
CO2: 23 mmol/L (ref 22–32)
Calcium: 8.9 mg/dL (ref 8.9–10.3)
Chloride: 107 mmol/L (ref 98–111)
Creatinine, Ser: 0.69 mg/dL (ref 0.44–1.00)
GFR calc Af Amer: >60 mL/min (ref >60)
GFR calc non Af Amer: >60 mL/min (ref >60)
Glucose, Bld: 93 mg/dL (ref 70–99)
Potassium: 3.9 mmol/L (ref 3.5–5.1)
Sodium: 136 mmol/L (ref 135–145)

## 2019-11-01 LAB — PHOSPHORUS: Phosphorus: 4 mg/dL (ref 2.5–4.6)

## 2019-11-01 LAB — MAGNESIUM: Magnesium: 2.4 mg/dL (ref 1.7–2.4)

## 2019-11-01 NOTE — Progress Notes (Signed)
PROGRESS NOTE    Hannah Padilla  UVO:[id number] DOB: 1950-07-09 DOA: 10/20/2019 PCP: Carlena Hurl, PA-C   Brief Narrative:  69 year old female with history of subclinical hyperthyroidism, RMSF, UTIs presented with progressive decline over the last 3 months. Prior to the onset of symptoms patient was healthy and able to complete all of her ADLs without any assistance. Per son, her PCP had diagnosed her with hyperthyroidism, and was placed on methimazole and propranolol. Reported that she had been having panic attacks, tremor, intermittent sweating, generalized weakness, falls, difficulty swallowing, hallucinations, insomnia and bowel and urine incontinence. Patient had extensive work-up including lumbar puncture, MRI, pheochromocytoma work-up, RMSF was positive. Completed 10 days of doxycycline. Patient is currently followed outpatient by Dr. Jannifer Franklin ( outpatient neurologist). She was placed on Valium which helped somewhat with the panic attacks,  Seroquel was attempted however patient became catatonic and this was discontinued after 2 doses.  Patient was admitted for further work-up.  Neurology was suspecting CJD remains very possible cause for her rapid neurological decline.  Ordered 24-hour urine collection for heavy metals,  Porphobilinogen, delta alae, results pending.  Patient had shown some signs of improvement after IV steroids, husband reports improvement with sinemet and B12 supplemntation.  Neurology recommended would hold on IVIG at this point given improvement in her symptoms. Neurology recommended inpatient rehab.   Assessment & Plan:   Principal Problem:   Metabolic encephalopathy Active Problems:   Anxiety   Transaminitis   Subclinical hyperthyroidism   Dysphagia   Acute metabolic encephalopathy   Acute metabolic encephalopathy with relatively rapid and progressive decline. -Has undergone extensive work-up with neurology including heavy metal testing, EEG, paraneoplastic  panel. Thus far work-up has been negative. -Has shown slow improvement with 5 days of IV steroids. -Currently holding off on IVIG treatment. -Vitamin B12 supplementation Sinemet 3 times daily  Subclinical hyperthyroidism -Continue Home regimen includes methimazole 5 mg 3 times daily.   Admitting physician, Dr. Fernande Boyden discussed with Dr. Delrae Rend. -TSH 0.8, free T4 1.96 (improving from 2.33), total T4 10.5, T3 73 -Dr. Buddy Duty will be able to see her outpatient,will need ambulatory referralto Dr. Kerr-endocrinology at the time of discharge -Continue methimazole and propranolol.  Anxiety, insomnia -Valium 2.5 mg every 12 hours. Seroquel 50 mg daily  Transaminitis -LFTs improving.  Dysphagia SLP evaluationcompleted on 9/16, recommended dysphagia 3 diet with thin liquids  History of [place] spotted fever-treated with 10 days of oral doxycycline outpatient.  PT recommendation-CIR.  Patient would really benefit from this towards her recovery.  As she physically recovers she does need medical supervision.  DVT prophylaxis: Lovenox Code Status: Full code Family Communication: Husband at bedside.  Status is: Inpatient  Remains inpatient appropriate because:Unsafe d/c plan   Dispo: The patient is from: Home              Anticipated d/c is to: CIR              Anticipated d/c date is: 1 day              Patient currently is medically stable to d/c. Awaiting placement at [facility]   There is no height or weight on file to calculate BMI.    Subjective: Seen and examined at bedside.  Patient is not very interactive.  She is sitting there with her eyes open.  Husband at bedside.  Husband is very anxious at this time regarding her disposition planning.  He feels it is unsafe for patient to go  to outpatient rehab and would really benefit from CIR  Review of Systems Difficult to obtain  Examination:  Constitutional: Not in acute distress, minimal verbal  interaction Respiratory: Clear to auscultation bilaterally Cardiovascular: Normal sinus rhythm, no rubs Abdomen: Nontender nondistended good bowel sounds Musculoskeletal: No edema noted Skin: No rashes seen Neurologic: Difficult to assess Psychiatric: Poor judgment and insight Objective: Vitals:   10/31/19 1503 10/31/19 1948 10/31/19 2353 11/01/19 0332  BP: 98/69 91/61 127/81 114/75  Pulse: 65 61 (!) 59 70  Resp:  16 14 20   Temp: (!) 97.4 F (36.3 C) 98.3 F (36.8 C) 97.7 F (36.5 C) (!) 97.5 F (36.4 C)  TempSrc: Oral Oral Oral Oral  SpO2: 99% 99% 100% 100%    Intake/Output Summary (Last 24 hours) at 11/01/2019 0846 Last data filed at 11/01/2019 0000 Gross per 24 hour  Intake 360 ml  Output 1002 ml  Net -642 ml   There were no vitals filed for this visit.   Data Reviewed:   CBC: Recent Labs  Lab 10/26/19 0150 10/29/19 0421 11/01/19 0437  WBC  --  11.5* 8.6  HGB 14.9 15.2* 13.8  HCT 44.2 45.8 41.7  MCV  --  95.2 95.6  PLT  --  238 782   Basic Metabolic Panel: Recent Labs  Lab 10/26/19 0150 10/29/19 0421 11/01/19 0437  NA 140 139 136  K 3.8 3.8 3.9  CL 109 108 107  CO2 24 20* 23  GLUCOSE 96 100* 93  BUN 22 20 14   CREATININE 0.75 0.59 0.69  CALCIUM 8.9 8.9 8.9  MG  --  2.4 2.4  PHOS  --  3.5 4.0   GFR: CrCl cannot be calculated (Unknown ideal weight.). Liver Function Tests: No results for input(s): AST, ALT, ALKPHOS, BILITOT, PROT, ALBUMIN in the last 168 hours. No results for input(s): LIPASE, AMYLASE in the last 168 hours. No results for input(s): AMMONIA in the last 168 hours. Coagulation Profile: No results for input(s): INR, PROTIME in the last 168 hours. Cardiac Enzymes: No results for input(s): CKTOTAL, CKMB, CKMBINDEX, TROPONINI in the last 168 hours. BNP (last 3 results) No results for input(s): PROBNP in the last 8760 hours. HbA1C: No results for input(s): HGBA1C in the last 72 hours. CBG: No results for input(s): GLUCAP in the last  168 hours. Lipid Profile: No results for input(s): CHOL, HDL, LDLCALC, TRIG, CHOLHDL, LDLDIRECT in the last 72 hours. Thyroid Function Tests: No results for input(s): TSH, T4TOTAL, FREET4, T3FREE, THYROIDAB in the last 72 hours. Anemia Panel: No results for input(s): VITAMINB12, FOLATE, FERRITIN, TIBC, IRON, RETICCTPCT in the last 72 hours. Sepsis Labs: No results for input(s): PROCALCITON, LATICACIDVEN in the last 168 hours.  No results found for this or any previous visit (from the past 240 hour(s)).       Radiology Studies: No results found.      Scheduled Meds: . carbidopa-levodopa  1 tablet Oral TID  . enoxaparin (LOVENOX) injection  40 mg Subcutaneous Q24H  . methimazole  5 mg Oral TID  . pantoprazole  40 mg Oral Daily  . propranolol ER  60 mg Oral Daily  . sertraline  50 mg Oral Daily  . sodium chloride flush  3 mL Intravenous Q12H   Continuous Infusions:   LOS: 11 days   Time spent= 35 mins    Sachiko Methot Arsenio Loader, MD [facility]  If 7PM-7AM, please contact night-coverage  11/01/2019, 8:46 AM

## 2019-11-01 NOTE — Progress Notes (Signed)
Occupational Therapy Treatment Patient Details Name: Hannah Padilla MRN: [id number] DOB: 1950-11-11 Today's Date: 11/01/2019    History of present illness 69 yo female with onset of Parkinson's like symptoms was admitted to ED, with recurrent changes of metabolic encephalopathy, AMS, panic attacks, and tremors. Pt is rapidly declining in her mobility to approach dependent care.  Neurologists are working to diagnose her condition. PMHx:  Rocky mountain spotted fever, UTI, dementia, incontinence, colon polyp, mood changes, hyperthyroidism, anxiety,    OT comments  Pt. Seen for skilled OT treatment session.  Spouse present and very engaged and ready to learn and assist pt. As needed. Requesting permission/clearance to perform bed mobilty and sit/stands with just the two of them.  Family ed focus of session for bed mobility, sitting eob, and partial sit/stand per his request.  Pt. Following intructional and demonstrational cues to reach for rails and assist with transition into sitting.  Pt. Able to sustain un supported sitting eob with no lob noted.  Max a x2 for assisting pt. Towards hob prior to sidelying and to manage trunk and bles.  Reviewed with pts. Spouse at this time is best to have another staff member present to assist but that it is important for pt. To move and be mobile and eob/oob as much as possible to increase endurance and strength in preparation for advancement of functional mobility and adls.  Spouse verbalizes understanding although he states they were managing at home prior to her admission to hospital.  He is a retired [employer] account executive and has a lot of experience and knowledge on how to position and assist people. Will continue with current POC and progress with adls as pt. Able.    Follow Up Recommendations  SNF;Supervision/Assistance - 24 hour    Equipment Recommendations  None recommended by OT    Recommendations for Other Services      Precautions / Restrictions Precautions Precautions:  Fall Precaution Comments: tone and tremors       Mobility Bed Mobility Overal bed mobility: Needs Assistance Bed Mobility: Rolling;Sidelying to Sit;Sit to Sidelying Rolling: Mod assist Sidelying to sit: Max assist     Sit to sidelying: Total assist;+2 for physical assistance General bed mobility comments: pts. husband active in tx session and wanting to assist and learn tech. and be given permission/green light to perform bed mobility, repositioning, and partial sit/stand while alone in the room with her.  pt. required mod a to roll R side with max verbal and min tactile guidance for following 1 step command "place hand here on rail".  max a to bring trunk upright. once seated eob able to maintain un supported sitting through duration while up.  cues to weight bear into L hip with notable tendancy to lean right.  education on how to bring into partial standing in prep for squat/stand pivot transfers. spouse initially holding pts. hands states that is how they were managing at home.  educated on safety and fall risks for them both.  showed spouse how to place his legs to gain leverage and also block knee. showed how to hold pads for increased leverage and safety to bring pt. buttocks off of bed to perform scoot towards hob.  spouse able to return demo but required therapist asst. support to assist. for log roll back to bed pt. did well with hand placement and transition into side lying. spouse guiding trunk and therapist asst. managing b les.  at this time reviewed he would still benefit from having a staff  member with him for safety but agree in bed mobility and eob throughout the day to increase endurance and activity tolerance.  Transfers                 General transfer comment: pt. assisting with spouse instructions and was able to weight bear through bles for partial sit/stand when moving towards hob    Balance                                           ADL either  performed or assessed with clinical judgement   ADL                                               Vision       Perception     Praxis      Cognition Arousal/Alertness: Awake/alert Behavior During Therapy: Flat affect Overall Cognitive Status: Impaired/Different from baseline Area of Impairment: Orientation;Attention;Following commands;Safety/judgement;Awareness;Problem solving                 Orientation Level: Situation;Time Current Attention Level: Selective Memory: Decreased recall of precautions;Decreased short-term memory Following Commands: Follows one step commands with increased time;Follows one step commands inconsistently Safety/Judgement: Decreased awareness of deficits Awareness: Intellectual Problem Solving: Slow processing;Requires verbal cues;Requires tactile cues General Comments: spouse reports they are talking more and pt. will speak to him.  states he notices it "takes her longer to reply".  reviewed allowing her time to process before he interjects or provides addtional options. he states he realizes he does that and he has to allow her time to process        Exercises     Shoulder Instructions       General Comments      Pertinent Vitals/ Pain          Home Living                                          Prior Functioning/Environment              Frequency  Min 2X/week        Progress Toward Goals  OT Goals(current goals can now be found in the care plan section)  Progress towards OT goals: Progressing toward goals     Plan Discharge plan remains appropriate    Co-evaluation                 AM-PAC OT "6 Clicks" Daily Activity     Outcome Measure   Help from another person eating meals?: Total Help from another person taking care of personal grooming?: Total Help from another person toileting, which includes using toliet, bedpan, or urinal?: Total Help from another person  bathing (including washing, rinsing, drying)?: Total Help from another person to put on and taking off regular upper body clothing?: Total Help from another person to put on and taking off regular lower body clothing?: Total 6 Click Score: 6    End of Session    OT Visit Diagnosis: Unsteadiness on feet (R26.81);Other abnormalities of gait and mobility (R26.89);Muscle weakness (generalized) (M62.81);Other symptoms and signs involving cognitive function   Activity  Tolerance Patient tolerated treatment well   Patient Left in bed;with family/visitor present   Nurse Communication          Time: 6886-4847 OT Time Calculation (min): 30 min  Charges: OT General Charges $OT Visit: 1 Visit OT Treatments $Therapeutic Activity: 23-37 mins  Sonia Baller, COTA/L Acute Rehabilitation [phone]   Janice Coffin 11/01/2019, 10:13 AM

## 2019-11-01 NOTE — Progress Notes (Signed)
Inpatient Rehab Admissions Coordinator:  Met with pt and husband at bedside.  Continued discussion about pt's appropriateness for CIR vs. SNF. Noted therapies continue to recommend SNF.  Also discussed insurance coverage of CIR vs. SNF and unlikelihood of insurance covering both types of rehab.  Husband acknowledged understanding.  He again mentioned his concern for pt receiving therapy in a facility that does not allow visitors and how that may affect the progress pt has made thus far in the hospital.  Will continue to monitor pt's progress with therapies.   Gayland Curry, [place], [place] Admissions Coordinator [phone]

## 2019-11-01 NOTE — Progress Notes (Signed)
[place] rounding this AM engaged in conversation w/ pt's husband, a very thoughtful and attentive advocate for the patient. This is the third day I have had the privilege of following this pt. With introduction this morning have realized that I have not engaged before as I have been very quickly checking on this low acuity patient and scurrying on to other assignments. Mr. [place]f expressed some disappointment in not having been introduced to our presence before. I apologized for our omission and clarified that we are not "survielllance" and that the extraordinary times we find ourselves have had some influence on the omission. I have reassured him that we will always announce ourselves in the future. Emotional support also provided as he expresses grave concerns for where his wife will be transitioned to following d/c. His highest hope is that it will be in house rehab as he has found the care here exceptional.

## 2019-11-02 ENCOUNTER — Encounter: Payer: Self-pay | Admitting: Internal Medicine

## 2019-11-02 LAB — GLUCOSE, CAPILLARY: Glucose-Capillary: 89 mg/dL (ref 70–99)

## 2019-11-02 NOTE — Progress Notes (Addendum)
Occupational Therapy Treatment Patient Details Name: Hannah Padilla MRN: [id number] DOB: May 11, 1950 Today's Date: 11/02/2019    History of present illness 69 yo female with onset of Parkinson's like symptoms was admitted to ED, with recurrent changes of metabolic encephalopathy, AMS, panic attacks, and tremors. Pt is rapidly declining in her mobility to approach dependent care.  Neurologists are working to diagnose her condition. PMHx:  Rocky mountain spotted fever, UTI, dementia, incontinence, colon polyp, mood changes, hyperthyroidism, anxiety,    OT comments  Patient supine in bed and agreeable to OT session with encouragement from spouse due to fatigued and having "just laid down" per pt.  Patient requires mod -max assist +2 for bed mobility, maintained sitting balance with min guard to close supervision at EOB while engaging in applying lotion to B LEs and washing hands. Required max assist to apply lotion to BLEs and min assist to wash hands, multimodal cueing for task initiation, sustained attention and completion.  Spouse is very supportive and pt is motivated.  Updated dc plan to CIR, as believe she will best benefit from intensive level to optimize independence and decrease burden of care with ADLs, mobility.  Will follow acutely.    Follow Up Recommendations  CIR;Supervision/Assistance - 24 hour    Equipment Recommendations  None recommended by OT    Recommendations for Other Services      Precautions / Restrictions Precautions Precautions: Fall Precaution Comments: tone and tremors Restrictions Weight Bearing Restrictions: No       Mobility Bed Mobility Overal bed mobility: Needs Assistance Bed Mobility: Rolling;Sit to Sidelying;Supine to Sit Rolling: Mod assist   Supine to sit: Max assist;+2 for physical assistance;+2 for safety/equipment   Sit to sidelying: Mod assist;+2 for physical assistance;+2 for safety/equipment General bed mobility comments: transitioned to EOB  with support of LEs and trunk using pad and spouses assist; returned to sidelying with min assist for trunkal initation and max assist for B LEs   Transfers                      Balance Overall balance assessment: Needs assistance;History of Falls Sitting-balance support: No upper extremity supported;Feet supported;Single extremity supported Sitting balance-Leahy Scale: Fair Sitting balance - Comments: inital R lateral lean, pt able to verbalize lean and correct with verbal/visual cueing and min guard; once midline able to sustain with min guard to close supervision  Postural control: Right lateral lean Standing balance support: Bilateral upper extremity supported;During functional activity Standing balance-Leahy Scale: Poor Standing balance comment: needs support with standing and shuffled steps to chair                           ADL either performed or assessed with clinical judgement   ADL Overall ADL's : Needs assistance/impaired     Grooming: Minimal assistance;Wash/dry hands;Sitting Grooming Details (indicate cue type and reason): sitting EOB to wipe hands with min assist for initation of task      Lower Body Bathing: Maximal assistance;Sitting/lateral leans Lower Body Bathing Details (indicate cue type and reason): applying lotion to BLEs with max assist for hand over hand support, forward lean and task attention                      Functional mobility during ADLs: Maximal assistance;+2 for physical assistance;Caregiver able to provide necessary level of assistance       Vision       Perception  Praxis      Cognition Arousal/Alertness: Awake/alert Behavior During Therapy: Flat affect;Anxious Overall Cognitive Status: Impaired/Different from baseline Area of Impairment: Attention;Safety/judgement;Problem solving;Awareness;Following commands;Memory                   Current Attention Level: Sustained Memory: Decreased recall  of precautions Following Commands: Follows multi-step commands inconsistently;Follows one step commands inconsistently Safety/Judgement: Decreased awareness of safety Awareness: Intellectual Problem Solving: Slow processing;Requires verbal cues;Requires tactile cues;Decreased initiation;Difficulty sequencing General Comments: pt anxious and reports fatigued, husband assuring her throughout session.  Follows simple commands with increased time but requires multimodal cueing for initation, completion and sequencing of tasks         Exercises     Shoulder Instructions       General Comments husband present and supportive     Pertinent Vitals/ Pain       Pain Assessment: No/denies pain  Home Living                                          Prior Functioning/Environment              Frequency  Min 2X/week        Progress Toward Goals  OT Goals(current goals can now be found in the care plan section)  Progress towards OT goals: Progressing toward goals  Acute Rehab OT Goals Patient Stated Goal: to get home with husband OT Goal Formulation: With patient  Plan Discharge plan needs to be updated;Frequency remains appropriate    Co-evaluation                 AM-PAC OT "6 Clicks" Daily Activity     Outcome Measure   Help from another person eating meals?: A Lot Help from another person taking care of personal grooming?: A Lot Help from another person toileting, which includes using toliet, bedpan, or urinal?: Total Help from another person bathing (including washing, rinsing, drying)?: A Lot Help from another person to put on and taking off regular upper body clothing?: A Lot Help from another person to put on and taking off regular lower body clothing?: Total 6 Click Score: 10    End of Session    OT Visit Diagnosis: Unsteadiness on feet (R26.81);Other abnormalities of gait and mobility (R26.89);Muscle weakness (generalized) (M62.81);Other  symptoms and signs involving cognitive function Pain - part of body:  (genearlized )   Activity Tolerance Patient tolerated treatment well   Patient Left in bed;with call bell/phone within reach;with bed alarm set;with family/visitor present   Nurse Communication Mobility status        Time: 1313-1340 OT Time Calculation (min): 27 min  Charges: OT General Charges $OT Visit: 1 Visit OT Treatments $Self Care/Home Management : 23-37 mins  [place] Pager [phone] Office [phone]     Delight Stare 11/02/2019, 2:38 PM

## 2019-11-02 NOTE — Progress Notes (Signed)
PROGRESS NOTE    Hannah Padilla  WRU:[id number] DOB: [birth year] DOA: 10/20/2019 PCP: Carlena Hurl, PA-C   Brief Narrative:  69 year old female with history of subclinical hyperthyroidism, RMSF, UTIs presented with progressive decline over the last 3 months. Prior to the onset of symptoms patient was healthy and able to complete all of her ADLs without any assistance. Per son, her PCP had diagnosed her with hyperthyroidism, and was placed on methimazole and propranolol. Reported that she had been having panic attacks, tremor, intermittent sweating, generalized weakness, falls, difficulty swallowing, hallucinations, insomnia and bowel and urine incontinence. Patient had extensive work-up including lumbar puncture, MRI, pheochromocytoma work-up, RMSF was positive. Completed 10 days of doxycycline. Patient is currently followed outpatient by Dr. Jannifer Franklin ( outpatient neurologist). She was placed on Valium which helped somewhat with the panic attacks,  Seroquel was attempted however patient became catatonic and this was discontinued after 2 doses. Patient was admitted for further work-up.  Neurology was suspecting CJD remains very possible cause for her rapid neurological decline.  Ordered 24-hour urine collection for heavy metals,  Porphobilinogen, delta alae, results pending.  Patient had shown some signs of improvement after IV steroids, husband reports improvement with sinemet and B12 supplemntation.  Neurology recommended would hold on IVIG at this point given improvement in her symptoms. Neurology recommended inpatient rehab.   Assessment & Plan:   Principal Problem:   Metabolic encephalopathy Active Problems:   Anxiety   Transaminitis   Subclinical hyperthyroidism   Dysphagia   Acute metabolic encephalopathy   Acute metabolic encephalopathy with relatively rapid and progressive decline. -Has undergone extensive work-up with neurology including heavy metal testing, EEG, paraneoplastic  panel. Thus far work-up has been negative. -Has shown slow improvement with 5 days of IV steroids. -Currently holding off on IVIG treatment. -Vitamin B12 supplementation -Sinemet 3 times daily  Subclinical hyperthyroidism -Continue Home regimen includes methimazole 5 mg 3 times daily.   Admitting physician, Dr. Fernande Boyden discussed with Dr. Delrae Rend. -TSH 0.8, free T4 1.96 (improving from 2.33), total T4 10.5, T3 73 -Dr. Buddy Duty will be able to see her outpatient,will need ambulatory referralto Dr. Kerr-endocrinology at the time of discharge -Continue methimazole and propranolol.  Anxiety, insomnia. -Currently on Valium 2.5 mg every 12 hours. Seroquel 50 mg daily  Transaminitis -LFTs improving.  Dysphagia SLP evaluationcompleted on 9/16, recommended dysphagia 3 diet with thin liquids  History of [place] spotted fever-treated with 10 days of oral doxycycline outpatient.  PT recommendation-CIR.  Patient would really benefit from this towards her recovery.  As she physically recovers she does need medical supervision.  DVT prophylaxis: Lovenox Code Status: Full code Family Communication: Husband at bedside  Status is: Inpatient  Remains inpatient appropriate because:Unsafe d/c plan   Dispo: The patient is from: Home              Anticipated d/c is to: CIR              Anticipated d/c date is: 1 day              Patient currently is medically stable to d/c. Awaiting placement at CIR   There is no height or weight on file to calculate BMI.    Subjective: Patient had episode of anxiety overnight this morning she is resting well.  Husband at bedside.  No complaints.  Review of Systems Did not pick up patient she has been feeling very anxious overnight and finally she is resting.  Husband at bedside all  the questions answered  Examination: On basic physical examination she is clear to auscultation bilaterally resting comfortably.  Normal sinus  rhythm.  Objective: Vitals:   11/01/19 1508 11/01/19 2008 11/01/19 2335 11/02/19 0438  BP: (!) 90/56 114/80 121/88 (!) 140/96  Pulse: 67 76 81 87  Resp: 20 19 18 19   Temp: (!) 97.3 F (36.3 C) 97.6 F (36.4 C) 97.7 F (36.5 C) 98.1 F (36.7 C)  TempSrc: Oral   Oral  SpO2: 98% 98% 97% 96%    Intake/Output Summary (Last 24 hours) at 11/02/2019 0759 Last data filed at 11/01/2019 1855 Gross per 24 hour  Intake 120 ml  Output 200 ml  Net -80 ml   There were no vitals filed for this visit.   Data Reviewed:   CBC: Recent Labs  Lab 10/29/19 0421 11/01/19 0437  WBC 11.5* 8.6  HGB 15.2* 13.8  HCT 45.8 41.7  MCV 95.2 95.6  PLT 238 841   Basic Metabolic Panel: Recent Labs  Lab 10/29/19 0421 11/01/19 0437  NA 139 136  K 3.8 3.9  CL 108 107  CO2 20* 23  GLUCOSE 100* 93  BUN 20 14  CREATININE 0.59 0.69  CALCIUM 8.9 8.9  MG 2.4 2.4  PHOS 3.5 4.0   GFR: CrCl cannot be calculated (Unknown ideal weight.). Liver Function Tests: No results for input(s): AST, ALT, ALKPHOS, BILITOT, PROT, ALBUMIN in the last 168 hours. No results for input(s): LIPASE, AMYLASE in the last 168 hours. No results for input(s): AMMONIA in the last 168 hours. Coagulation Profile: No results for input(s): INR, PROTIME in the last 168 hours. Cardiac Enzymes: No results for input(s): CKTOTAL, CKMB, CKMBINDEX, TROPONINI in the last 168 hours. BNP (last 3 results) No results for input(s): PROBNP in the last 8760 hours. HbA1C: No results for input(s): HGBA1C in the last 72 hours. CBG: No results for input(s): GLUCAP in the last 168 hours. Lipid Profile: No results for input(s): CHOL, HDL, LDLCALC, TRIG, CHOLHDL, LDLDIRECT in the last 72 hours. Thyroid Function Tests: No results for input(s): TSH, T4TOTAL, FREET4, T3FREE, THYROIDAB in the last 72 hours. Anemia Panel: No results for input(s): VITAMINB12, FOLATE, FERRITIN, TIBC, IRON, RETICCTPCT in the last 72 hours. Sepsis Labs: No results for  input(s): PROCALCITON, LATICACIDVEN in the last 168 hours.  No results found for this or any previous visit (from the past 240 hour(s)).       Radiology Studies: No results found.      Scheduled Meds: . carbidopa-levodopa  1 tablet Oral TID  . enoxaparin (LOVENOX) injection  40 mg Subcutaneous Q24H  . methimazole  5 mg Oral TID  . pantoprazole  40 mg Oral Daily  . propranolol ER  60 mg Oral Daily  . sertraline  50 mg Oral Daily  . sodium chloride flush  3 mL Intravenous Q12H   Continuous Infusions:   LOS: 12 days   Time spent= 35 mins    Barron Vanloan Arsenio Loader, MD [facility]  If 7PM-7AM, please contact night-coverage  11/02/2019, 7:59 AM

## 2019-11-02 NOTE — Progress Notes (Signed)
  Speech Language Pathology Treatment: Cognitive-Linquistic  Patient Details Name: ARIELYS WANDERSEE MRN: [id number] DOB: Feb 28, 1950 Today's Date: 11/02/2019 Time: 1583-0940 SLP Time Calculation (min) (ACUTE ONLY): 18 min  Assessment / Plan / Recommendation Clinical Impression  Pt was seen for cognitive/language treatment and was attentive and cooperative throughout the session. Treatment focused on increasing intelligbility at the phrase level. She participated in [place] (MIT) in order to facilitate expression of intelligible short phrases and target increased volume. Given direct instruction, she demonstrated increased volume during MIT while counting and saying short phrases. She also participated in Respiratory Muscle Training (RMT) in order to address inadequate respiration for speech. After direct instruction, she was started at 15 cmH20 and succesfully completed 2 reps before pressure was increased to 19 cmH20. After being able to complete only 2 reps at 19 cmH20 and 2 reps with pressure decreased to 17cmH20, pressure was decreased back to 15 cmH20. Pt then successfully achieved 5 reps at 15 cmH20. Suspect her inability to complete 3 reps with device is due to inadequate coordination for respiration rather than weakness. Recommend pt participate in RMT 3x/day, 2 sets of 5 reps each time. SLP will continue to f/u acutely.    HPI HPI: 69 yo female with onset of Parkinson's like symptoms was admitted to ED, with recurrent changes of metabolic encephalopathy, AMS, panic attacks, and tremors. Pt is rapidly declining in her mobility to approach dependent care.  Neurologists are working to diagnose her condition. PMHx:  Rocky mountain spotted fever, UTI, dementia, incontinence, colon polyp, mood changes, hyperthyroidism, anxiety,      SLP Plan  Continue with current plan of care       Recommendations   Oral Care Recommendations: Oral care BID Follow up Recommendations: Inpatient  Rehab SLP Visit Diagnosis: Aphasia (R47.01) Plan: Continue with current plan of care       Progression towards goals: Progressing towards goals Patient/Family stated goal: to increase intelligibility of her expression Potential to achieve goals: Fair Potential considerations: Severity of impairments                GO                Greggory Keen 11/02/2019, 12:41 PM

## 2019-11-02 NOTE — Progress Notes (Signed)
Physical Therapy Treatment Patient Details Name: Hannah Padilla MRN: [id number] DOB: [birth year] Today's Date: 11/02/2019    History of Present Illness 69 yo female with onset of Parkinson's like symptoms was admitted to ED, with recurrent changes of metabolic encephalopathy, AMS, panic attacks, and tremors. Pt is rapidly declining in her mobility to approach dependent care.  Neurologists are working to diagnose her condition. PMHx:  Rocky mountain spotted fever, UTI, dementia, incontinence, colon polyp, mood changes, hyperthyroidism, anxiety,     PT Comments    Today's skilled session continued to focus on mobility progression with less overall assistance needed with bed mobility and sitting balance vs previous sessions. Pt responding to conversation with short answers and following simple commands with increased time needed on occasion. Discussed progress with PT on floor, agreed that pt would benefit from inpatient rehab stay to increase her mobility to allow pt to discharge home with spouse support. Follow up recommendations and frequency updated this session.    Follow Up Recommendations  CIR     Equipment Recommendations  None recommended by PT (defer to next venue of care)    Recommendations for Other Services Rehab consult     Precautions / Restrictions Precautions Precautions: Fall Precaution Comments: tone and tremors Restrictions Weight Bearing Restrictions: No Other Position/Activity Restrictions: no buckling or hyperextension noted today with standing    Mobility  Bed Mobility Overal bed mobility: Needs Assistance       Supine to sit: Mod assist;HOB elevated     General bed mobility comments: with HOB 35 degrees- pt able to assist with advancing bil LE's toward and off edge of bed. Pt then used right UE to assist with trunk elevation into sitting with mod assist. pads under pt used to scoot pt fully to edge of bed.  Transfers Overall transfer level: Needs  assistance Equipment used: 2 person hand held assist Transfers: Sit to/from [facility] Sit to Stand: Mod assist;Min assist;+2 physical assistance Stand pivot transfers: Mod assist;Min assist;+2 physical assistance       General transfer comment: with PTA one side/rehab tech on the other side pt stood from edge of bed with min/mod assist, then pt was able to take shuffled steps toward chair with min/mod assist of 2 with max assist for controlled descent to chair. pt needed cues/facilitation for weight shifting to stand, for posture in standing and weight shifting to allow for LE's to move with shuffled steps.      Balance   Sitting-balance support: Feet supported;Bilateral upper extremity supported;No upper extremity supported Sitting balance-Leahy Scale: Fair Sitting balance - Comments: pt progressed from poor to fair while working on neuro re-ed activities at edge of mat.  worked on [employer] shifting with cues to "push me away" and "pull me back to you" with pt's right UE on PTA shoulder/forearm. then worked on left elbow propping on bed. Holding this position had pt looking toward PTA on the left side as well. after NMR activities pt was able to sit unsupported in midline position for ~45-60 sec's with close supervision before standing and transfering to chair. Postural control: Right lateral lean Standing balance support: Bilateral upper extremity supported;During functional activity Standing balance-Leahy Scale: Poor Standing balance comment: needs support with standing and shuffled steps to chair            Cognition Arousal/Alertness: Awake/alert Behavior During Therapy: WFL for tasks assessed/performed;Flat affect Overall Cognitive Status: Impaired/Different from baseline Area of Impairment: Attention;Safety/judgement;Problem solving;Awareness  Current Attention Level: Selective Memory: Decreased recall of precautions Following  Commands: Follows one step commands consistently;Follows multi-step commands inconsistently Safety/Judgement: Decreased awareness of safety Awareness: Intellectual Problem Solving: Slow processing;Requires verbal cues;Requires tactile cues General Comments: pt conversational with PTA today expressing concern for her Leil[place] Able not to get lost, spouse reassured her he has it all. Pt able to state she was leaning to right when asked, however needed cues/faciliation to correct her lean. Pt was also able to express her fear and anxiety when presented with new tasks today while edge of bed. When asked to reach for PTA shoulder she was able to initiate the movement, needed assist to complete the task.       Pertinent Vitals/Pain Pain Assessment: No/denies pain Pain Score: 0-No pain     PT Goals (current goals can now be found in the care plan section) Acute Rehab PT Goals Patient Stated Goal: to get home with husband Time For Goal Achievement: 11/03/19 Potential to Achieve Goals: Good Progress towards PT goals: Progressing toward goals    Frequency    Min 3X/week      PT Plan Discharge plan needs to be updated;Frequency needs to be updated    AM-PAC PT "6 Clicks" Mobility   Outcome Measure  Help needed turning from your back to your side while in a flat bed without using bedrails?: A Lot Help needed moving from lying on your back to sitting on the side of a flat bed without using bedrails?: A Lot Help needed moving to and from a bed to a chair (including a wheelchair)?: A Lot Help needed standing up from a chair using your arms (e.g., wheelchair or bedside chair)?: A Lot Help needed to walk in hospital room?: A Lot Help needed climbing 3-5 steps with a railing? : Total 6 Click Score: 11    End of Session Equipment Utilized During Treatment: Gait belt Activity Tolerance: Patient tolerated treatment well Patient left: in chair;with call bell/phone within reach;with family/visitor  present Nurse Communication: Mobility status;Other (comment) (change in discharge recomendations) PT Visit Diagnosis: Unsteadiness on feet (R26.81);Muscle weakness (generalized) (M62.81);History of falling (Z91.81);Other abnormalities of gait and mobility (R26.89);Other symptoms and signs involving the nervous system (R29.898)     Time: 1000-1025 PT Time Calculation (min) (ACUTE ONLY): 25 min  Charges:  $Therapeutic Activity: 8-22 mins $Neuromuscular Re-education: 8-22 mins                    Willow Ora, PTA, [facility] Acute Rehab Services Office- [phone] 11/02/19, 10:49 AM   Willow Ora 11/02/2019, 10:48 AM

## 2019-11-02 NOTE — TOC Progression Note (Signed)
Transition of Care [facility]) - Progression Note    Patient Details  Name: Hannah Padilla MRN: [id number] Date of Birth: September 22, 1950  Transition of Care [facility]) CM/SW [place], [place] Phone Number: 11/02/2019, 3:35 PM  Clinical Narrative:   CSW assisted with coordinating possible transition to CIR vs SNF at discharge. CSW spoke with PT assigned today, to ask about patient's progress and whether CIR would be appropriate. PT updated recommendations based on session today, and CSW notified Rehab Admissions. Rehab Admissions indicated that OT will need to change their recommendation, as well. CSW asked for OT to pick up the patient and evaluate for recommendations, and OT has updated recommendations based on patient progress. CSW alerted Rehab Admissions to review. CSW to continue to follow.    Expected Discharge Plan: [place] Barriers to Discharge: Continued Medical Work up, SNF Pending bed offer  Expected Discharge Plan and Services Expected Discharge Plan: [place] In-house Referral: Clinical Social Work     Living arrangements for the past 2 months: Single Family Home                                       Social Determinants of Health (SDOH) Interventions    Readmission Risk Interventions No flowsheet data found.

## 2019-11-02 NOTE — Progress Notes (Signed)
Patient having tremors, stated she was nauseous and afraid. PRN medication gvn for nausea and anxiety, Zofran and valium.

## 2019-11-03 ENCOUNTER — Ambulatory Visit: Payer: Medicare Other | Admitting: Gastroenterology

## 2019-11-03 MED ORDER — DIAZEPAM 2 MG PO TABS
2.0000 mg | ORAL_TABLET | Freq: Once | ORAL | Status: AC
  Administered 2019-11-03: 2 mg via ORAL
  Filled 2019-11-03: qty 1

## 2019-11-03 MED ORDER — DIAZEPAM 5 MG PO TABS: 2.5000 mg | ORAL_TABLET | Freq: Two times a day (BID) | ORAL | 0 refills | Status: DC | PRN

## 2019-11-03 MED ORDER — CARBIDOPA-LEVODOPA 25-100 MG PO TABS: 1.0000 | ORAL_TABLET | Freq: Three times a day (TID) | ORAL | Status: DC

## 2019-11-03 NOTE — Progress Notes (Addendum)
Inpatient Rehabilitation Admissions Coordinator  I will begin insurance authorization with [facility] for a possible CIR admit.  Danne Baxter, RN, MSN Rehab Admissions Coordinator [phone] 11/03/2019 9:20 AM   I met at bedside with patient and her spouse and they are aware.  Danne Baxter, RN, MSN Rehab Admissions Coordinator [phone] 11/03/2019 12:23 PM

## 2019-11-03 NOTE — Progress Notes (Signed)
Physical Therapy Treatment Patient Details Name: Hannah Padilla MRN: [id number] DOB: 01/20/1951 Today's Date: 11/03/2019    History of Present Illness 69 yo female with onset of Parkinson's like symptoms was admitted to ED, with recurrent changes of metabolic encephalopathy, AMS, panic attacks, and tremors. Pt is rapidly declining in her mobility to approach dependent care.  Neurologists are working to diagnose her condition. PMHx:  Rocky mountain spotted fever, UTI, dementia, incontinence, colon polyp, mood changes, hyperthyroidism, anxiety,     PT Comments    Pt progressing slowly toward goals. Very stiff and bradykinetic.  Emphasis on warm up ROM/stretching, transitions, scooting, sit to stand, pre-gait and finally transfer to the chair.   Follow Up Recommendations  CIR     Equipment Recommendations  None recommended by PT    Recommendations for Other Services Rehab consult     Precautions / Restrictions Precautions Precautions: Fall Precaution Comments: tone and tremors    Mobility  Bed Mobility Overal bed mobility: Needs Assistance Bed Mobility: Supine to Sit;Sit to Supine     Supine to sit: Mod assist;+2 for physical assistance     General bed mobility comments: assisted LE's and brought trunk up and forward with use of the pad to pivot.  Transfers Overall transfer level: Needs assistance   Transfers: Sit to/from Stand;Stand Pivot Transfers Sit to Stand: Mod assist;+2 safety/equipment (x3) Stand pivot transfers: Mod assist;+2 physical assistance       General transfer comment: cues for hand placement, assist to come forward and  and boost.  With a face to face assist, pt able to w/shift and move her feet in a more "tin-soldier-like" pattern to step forward and back in pivot to chair.  Ambulation/Gait Ambulation/Gait assistance: Max assist;Mod assist;+2 physical assistance Gait Distance (Feet): 3 Feet (forward and back.) Assistive device: 1 person hand held  assist;2 person hand held assist Gait Pattern/deviations: Step-to pattern     General Gait Details: pt needed w/shift, stability and each LE advancement assist.  Pt generally unable to unweight or even slide her feet today.   Stairs             Wheelchair Mobility    Modified Rankin (Stroke Patients Only)       Balance Overall balance assessment: Needs assistance;History of Falls Sitting-balance support: Feet supported Sitting balance-Leahy Scale: Fair     Standing balance support: Bilateral upper extremity supported;During functional activity Standing balance-Leahy Scale: Poor Standing balance comment: reliant on external support                            Cognition Arousal/Alertness: Awake/alert Behavior During Therapy: Anxious;Flat affect Overall Cognitive Status: Impaired/Different from baseline                   Orientation Level: Situation Current Attention Level: Sustained Memory: Decreased short-term memory Following Commands: Follows one step commands inconsistently Safety/Judgement: Decreased awareness of safety Awareness: Intellectual Problem Solving: Slow processing;Requires verbal cues;Requires tactile cues;Decreased initiation;Difficulty sequencing        Exercises Other Exercises Other Exercises: bil LE and UE exercise with resistance--tricip/bicep presses, hip/knee flexion extension with resistance gross extension    General Comments General comments (skin integrity, edema, etc.): Time spent with ROM to reduce tone in all 4 extremities.      Pertinent Vitals/Pain Pain Assessment: Faces Faces Pain Scale: No hurt Pain Intervention(s): Monitored during session    Home Living  Prior Function            PT Goals (current goals can now be found in the care plan section) Acute Rehab PT Goals Patient Stated Goal: to get home with husband PT Goal Formulation: With family Time For Goal  Achievement: 11/04/19 Potential to Achieve Goals: Good Progress towards PT goals: Progressing toward goals    Frequency    Min 3X/week      PT Plan Current plan remains appropriate    Co-evaluation              AM-PAC PT "6 Clicks" Mobility   Outcome Measure  Help needed turning from your back to your side while in a flat bed without using bedrails?: A Lot Help needed moving from lying on your back to sitting on the side of a flat bed without using bedrails?: A Lot Help needed moving to and from a bed to a chair (including a wheelchair)?: A Lot Help needed standing up from a chair using your arms (e.g., wheelchair or bedside chair)?: A Lot Help needed to walk in hospital room?: A Lot Help needed climbing 3-5 steps with a railing? : Total 6 Click Score: 11    End of Session   Activity Tolerance: Patient tolerated treatment well Patient left: in chair;with call bell/phone within reach;with family/visitor present Nurse Communication: Mobility status PT Visit Diagnosis: Unsteadiness on feet (R26.81);Other abnormalities of gait and mobility (R26.89);Difficulty in walking, not elsewhere classified (R26.2);Other symptoms and signs involving the nervous system (R29.898)     Time: 3149-7026 PT Time Calculation (min) (ACUTE ONLY): 35 min  Charges:  $Therapeutic Activity: 8-22 mins $Neuromuscular Re-education: 8-22 mins                     11/03/2019  Ginger Carne., PT Acute Rehabilitation Services [phone]  (pager) [phone]  (office)   Tessie Fass [place] 11/03/2019, 5:48 PM

## 2019-11-03 NOTE — Progress Notes (Signed)
PROGRESS NOTE    Hannah Padilla  HUD:[id number] DOB: [birth year] DOA: 10/20/2019 PCP: Carlena Hurl, PA-C   Brief Narrative:  69 year old female with history of subclinical hyperthyroidism, RMSF, UTIs presented with progressive decline over the last 3 months. Prior to the onset of symptoms patient was healthy and able to complete all of her ADLs without any assistance. Per son, her PCP had diagnosed her with hyperthyroidism, and was placed on methimazole and propranolol. Reported that she had been having panic attacks, tremor, intermittent sweating, generalized weakness, falls, difficulty swallowing, hallucinations, insomnia and bowel and urine incontinence. Patient had extensive work-up including lumbar puncture, MRI, pheochromocytoma work-up, RMSF was positive. Completed 10 days of doxycycline. Patient is currently followed outpatient by Dr. Jannifer Franklin ( outpatient neurologist). She was placed on Valium which helped somewhat with the panic attacks,  Seroquel was attempted however patient became catatonic and this was discontinued after 2 doses. Patient was admitted for further work-up.  Neurology was suspecting CJD remains very possible cause for her rapid neurological decline.  Ordered 24-hour urine collection for heavy metals,  Porphobilinogen, delta alae, results pending.  Patient had shown some signs of improvement after IV steroids, husband reports improvement with sinemet and B12 supplemntation.  Neurology recommended would hold on IVIG at this point given improvement in her symptoms. PT recommending rehab.  Medically stable, pending CIR   Assessment & Plan:   Principal Problem:   Metabolic encephalopathy Active Problems:   Anxiety   Transaminitis   Subclinical hyperthyroidism   Dysphagia   Acute metabolic encephalopathy   Acute metabolic encephalopathy with relatively rapid and progressive decline.  Intermittent -Has undergone extensive work-up with neurology including heavy metal  testing, EEG, paraneoplastic panel. Thus far work-up has been negative. -Has shown slow improvement with 5 days of IV steroids. -Currently holding off on IVIG treatment. -Vitamin B12 supplementation -Sinemet 3 times daily  Subclinical hyperthyroidism -Continue Home regimen includes methimazole 5 mg 3 times daily.   Admitting physician, Dr. Fernande Boyden discussed with Dr. Delrae Rend. -TSH 0.8, free T4 1.96 (improving from 2.33), total T4 10.5, T3 73 -Dr. Buddy Duty will be able to see her outpatient,will need ambulatory referralto Dr. Kerr-endocrinology at the time of discharge -Continue methimazole and propranolol.  Anxiety, insomnia. -Currently on Valium 2.5 mg every 12 hours. Seroquel 50 mg daily  Transaminitis -LFTs improving.  Dysphagia SLP evaluationcompleted on 9/16, recommended dysphagia 3 diet with thin liquids  History of [place] spotted fever-treated with 10 days of oral doxycycline outpatient.  PT recommendations-CIR  DVT prophylaxis: Lovenox Code Status: Full code Family Communication: Husband at bedside  Status is: Inpatient  Remains inpatient appropriate because:Unsafe d/c plan   Dispo: The patient is from: Home              Anticipated d/c is to: CIR              Anticipated d/c date is: 1 day              Patient currently is medically stable to d/c.  Awaiting placement at CIR, ongoing insurance authorization process   There is no height or weight on file to calculate BMI.    Subjective: Met with husband and patient at bedside.  Patient does not have any complaints.  Review of Systems Did not pick up patient she has been feeling very anxious overnight and finally she is resting.  Husband at bedside all the questions answered  Examination: Constitutional: Not in acute distress, elderly chronically ill-appearing Respiratory:  Clear to auscultation bilaterally Cardiovascular: Normal sinus rhythm, no rubs Abdomen: Nontender  nondistended good bowel sounds Musculoskeletal: No edema noted Skin: No rashes seen Neurologic: CN 2-12 grossly intact.  And nonfocal Psychiatric: Poor judgment and insight bilateral alert to name and place  Objective: Vitals:   11/02/19 2350 11/03/19 0154 11/03/19 0409 11/03/19 0742  BP:  114/70 111/67 110/74  Pulse: 69 70 66 70  Resp: _0 Temp:  98.2 F (36.8 C) 97.6 F (36.4 C) 98.1 F (36.7 C)  TempSrc: Oral Oral Oral Oral  SpO2: 100% 100% 99% 98%    Intake/Output Summary (Last 24 hours) at 11/03/2019 1247 Last data filed at 11/03/2019 0900 Gross per 24 hour  Intake 383 ml  Output 600 ml  Net -217 ml   There were no vitals filed for this visit.   Data Reviewed:   CBC: Recent Labs  Lab 10/29/19 0421 11/01/19 0437  WBC 11.5* 8.6  HGB 15.2* 13.8  HCT 45.8 41.7  MCV 95.2 95.6  PLT 238 614   Basic Metabolic Panel: Recent Labs  Lab 10/29/19 0421 11/01/19 0437  NA 139 136  K 3.8 3.9  CL 108 107  CO2 20* 23  GLUCOSE 100* 93  BUN 20 14  CREATININE 0.59 0.69  CALCIUM 8.9 8.9  MG 2.4 2.4  PHOS 3.5 4.0   GFR: CrCl cannot be calculated (Unknown ideal weight.). Liver Function Tests: No results for input(s): AST, ALT, ALKPHOS, BILITOT, PROT, ALBUMIN in the last 168 hours. No results for input(s): LIPASE, AMYLASE in the last 168 hours. No results for input(s): AMMONIA in the last 168 hours. Coagulation Profile: No results for input(s): INR, PROTIME in the last 168 hours. Cardiac Enzymes: No results for input(s): CKTOTAL, CKMB, CKMBINDEX, TROPONINI in the last 168 hours. BNP (last 3 results) No results for input(s): PROBNP in the last 8760 hours. HbA1C: No results for input(s): HGBA1C in the last 72 hours. CBG: Recent Labs  Lab 11/02/19 1123  GLUCAP 89   Lipid Profile: No results for input(s): CHOL, HDL, LDLCALC, TRIG, CHOLHDL, LDLDIRECT in the last 72 hours. Thyroid Function Tests: No results for input(s): TSH, T4TOTAL, FREET4, T3FREE,  THYROIDAB in the last 72 hours. Anemia Panel: No results for input(s): VITAMINB12, FOLATE, FERRITIN, TIBC, IRON, RETICCTPCT in the last 72 hours. Sepsis Labs: No results for input(s): PROCALCITON, LATICACIDVEN in the last 168 hours.  No results found for this or any previous visit (from the past 240 hour(s)).       Radiology Studies: No results found.      Scheduled Meds: . carbidopa-levodopa  1 tablet Oral TID  . enoxaparin (LOVENOX) injection  40 mg Subcutaneous Q24H  . methimazole  5 mg Oral TID  . pantoprazole  40 mg Oral Daily  . propranolol ER  60 mg Oral Daily  . sertraline  50 mg Oral Daily  . sodium chloride flush  3 mL Intravenous Q12H   Continuous Infusions:   LOS: 13 days   Time spent= 15 mins    Shatoya Roets Arsenio Loader, MD [facility]  If 7PM-7AM, please contact night-coverage  11/03/2019, 12:47 PM

## 2019-11-04 NOTE — PMR Pre-admission (Signed)
PMR Admission Coordinator Pre-Admission Assessment  Patient: Hannah Padilla is an 69 y.o., female MRN: [id number] DOB: 31-Mar-1950 Height:   Weight:                Insurance Information HMO: yes  PPO:      PCP:      IPA:      80/20:      OTHER:  PRIMARY: [employer] Medicare      Policy#: [id number]      Subscriber: pt CM Name: Vito Backers at expedited appeal      Phone#: [phone] option 8     Fax#: [phone] Pre-Cert#: [id number] initial denial . Approved via expedited appeal approved for 7 days update due 10/8     Employer: n/a Benefits:  Phone #: [phone]     Name:  Eff. Date: 02/06/2019     Deduct: none      Out of Pocket Max: $4500      Life Max: none  CIR: $325 co pay per day days 1 until 5      SNF: no copay days 1 until 20; $184 co py per day days 21 until 45; no copay days 46 until 100 Outpatient: $35 per visit     Co-Pay: visits per medical neccesity Home Health: 100%      Co-Pay: visits per medical l neccesity DME: 80%     Co-Pay: 20% Providers: in network  SECONDARY: none      Policy#:       Phone#:   Development worker, community:       Phone#:   [employer] Engineer, [employer]" for patients in Inpatient Rehabilitation Facilities with attached "Privacy Act [place] Records" was provided and verbally reviewed with: Family  Emergency Contact Information Contact Information    Name Relation Home Work Mobile   [place] Spouse [phone]  [phone]     Current Medical History  Patient Admitting Diagnosis: altered Mental status, encephalopathy  History of Present Illness:  69 year old right-handed female with history of hyperthyroidism maintained on Tapazole, recurrent UTIs, [place] spotted fever.  Presented 10/20/2019 with progressive decline with noted apraxia over the last 3 months as well as intermittent panic attacks generalized weakness and falls.  Prior to onset of symptoms patient was healthy able to complete all of her ADLs without  assistance.  Most recent MRI showed mild generalized cortical atrophy stable compared to scan of 08/11/2019.  Scattered T2 FLAIR hyperintense foci predominantly in the subcortical and deep white matter of the hemispheres most consistent with chronic microvascular ischemic change.  No acute findings.  EEG consistent with mild to moderate diffuse encephalopathy.  No seizure.  CT of the chest abdomen pelvis showed no evidence of primary metastatic disease.  Admission chemistries potassium 3.4 total protein 6.4 AST 49 ALT 50 hemoglobin 14.7 SARS coronavirus negative, TSH 0.851, free T4 1.96, T3 total 73, T4 total 10.5 ammonia level 17, ESR of 2, paraneoplastic CSF study negative.  Neurology follow-up suspect progressive dementia of unknown etiology.  She did receive 5 days of IV steroids during her hospital course as well as initiation of Sinemet for trial.  She is on a mechanical soft diet thin liquids.  Subcutaneous Lovenox for DVT prophylaxis.  Thyroid function studies were reviewed by endocrinology Dr. Buddy Duty who recommends follow-up outpatient with ambulatory referral with recommendations to continue Tapazole as well as propranolol.  Past Medical History  Past Medical History:  Diagnosis Date  . Colon polyp 2015  . Elevated serum free  T4 level    husband report  . Farsightedness    wears glasses  . Mood change 2021   major change in affect, health issues with multiple ED visits, hospitalization, starting 07/10/2019  . RMSF [facility] spotted fever) 2021  . Urinary tract infection     Family History  family history includes Alzheimer's disease in her mother; COPD in her father; Other in her father; Protein S deficiency in an other family member.  Prior Rehab/Hospitalizations:  Has the patient had prior rehab or hospitalizations prior to admission? Yes  Has the patient had major surgery during 100 days prior to admission? No  Current Medications   Current Facility-Administered Medications:   .  acetaminophen (TYLENOL) tablet 650 mg, 650 mg, Oral, Q6H PRN **OR** acetaminophen (TYLENOL) suppository 650 mg, 650 mg, Rectal, Q6H PRN, Smith, Rondell A, MD .  albuterol (PROVENTIL) (2.5 MG/3ML) 0.083% nebulizer solution 2.5 mg, 2.5 mg, Nebulization, Q6H PRN, Smith, Rondell A, MD .  bisacodyl (DULCOLAX) EC tablet 10 mg, 10 mg, Oral, Daily PRN, Tamala Julian, Rondell A, MD .  bisacodyl (DULCOLAX) suppository 10 mg, 10 mg, Rectal, Daily PRN, Amin, Ankit Chirag, MD, 10 mg at 11/07/19 1811 .  carbidopa-levodopa (SINEMET IR) 25-100 MG per tablet immediate release 1 tablet, 1 tablet, Oral, TID, Greta Doom, MD, 1 tablet at 11/09/19 0817 .  diazepam (VALIUM) tablet 2.5 mg, 2.5 mg, Oral, Q12H PRN, Rai, Ripudeep K, MD, 2.5 mg at 11/08/19 1941 .  enoxaparin (LOVENOX) injection 40 mg, 40 mg, Subcutaneous, Q24H, Smith, Rondell A, MD, 40 mg at 11/08/19 1942 .  methimazole (TAPAZOLE) tablet 5 mg, 5 mg, Oral, TID, Tamala Julian, Rondell A, MD, 5 mg at 11/08/19 2112 .  ondansetron (ZOFRAN) tablet 4 mg, 4 mg, Oral, Q6H PRN **OR** ondansetron (ZOFRAN) injection 4 mg, 4 mg, Intravenous, Q6H PRN, Tamala Julian, Rondell A, MD, 4 mg at 11/02/19 0551 .  pantoprazole (PROTONIX) EC tablet 40 mg, 40 mg, Oral, Daily, Smith, Rondell A, MD, 40 mg at 11/08/19 1046 .  polyethylene glycol (MIRALAX / GLYCOLAX) packet 17 g, 17 g, Oral, Daily, Amin, Ankit Chirag, MD, 17 g at 11/08/19 1046 .  polyvinyl alcohol (LIQUIFILM TEARS) 1.4 % ophthalmic solution 1 drop, 1 drop, Both Eyes, PRN, Amin, Ankit Chirag, MD .  propranolol ER (INDERAL LA) 24 hr capsule 60 mg, 60 mg, Oral, Daily, Smith, Rondell A, MD, 60 mg at 11/08/19 1046 .  sertraline (ZOLOFT) tablet 50 mg, 50 mg, Oral, Daily, Smith, Rondell A, MD, 50 mg at 11/08/19 1046 .  sodium chloride flush (NS) 0.9 % injection 3 mL, 3 mL, Intravenous, Q12H, Smith, Rondell A, MD, 3 mL at 11/08/19 2112  Patients Current Diet:  Diet Order            DIET DYS 3 Room service appropriate? Yes; Fluid  consistency: Thin  Diet effective now                 Precautions / Restrictions Precautions Precautions: Fall Precaution Comments: tone and tremors Restrictions Weight Bearing Restrictions: No Other Position/Activity Restrictions: no buckling or hyperextension noted today with standing   Has the patient had 2 or more falls or a fall with injury in the past year?No  Prior Activity Level Limited Community (1-2x/wk): decline in function over past 3 months  Prior Functional Level Prior Function Level of Independence: Needs assistance Gait / Transfers Assistance Needed: recently has needed help to walk and transfer, gait for very short trips of a few steps ADL's /  Homemaking Assistance Needed: husband cares for home, assists wife to dress and bathe Communication / Swallowing Assistance Needed: soft vocal quality; mumbling at times and sometimes coherant Comments: per husband has times wiht blank stairing  Self Care: Did the patient need help bathing, dressing, using the toilet or eating?  Needed some help  Indoor Mobility: Did the patient need assistance with walking from room to room (with or without device)? Needed some help  Stairs: Did the patient need assistance with internal or external stairs (with or without device)? Needed some help  Functional Cognition: Did the patient need help planning regular tasks such as shopping or remembering to take medications? Needed some help  Home Assistive Devices / Equipment Home Equipment: Bedside commode  Prior Device Use: Indicate devices/aids used by the patient prior to current illness, exacerbation or injury? Walker  Current Functional Level Cognition  Arousal/Alertness: Lethargic Overall Cognitive Status: Impaired/Different from baseline Difficult to assess due to: Impaired communication Current Attention Level: Sustained Orientation Level: Oriented to person, Oriented to place Following Commands: Follows one step commands  consistently Safety/Judgement: Decreased awareness of safety General Comments: pt remains anxious but is motivated, is able to follow one step commands with increased accuracy (less reliance on multi-modal cues but still needed), but continues to demonstrate deficits for multi-step commands Attention: Focused, Sustained, Selective Focused Attention: Impaired Focused Attention Impairment: Verbal basic Sustained Attention: Impaired Sustained Attention Impairment: Verbal basic Memory: Impaired Memory Impairment: Storage deficit, Decreased recall of new information Awareness: Impaired Awareness Impairment: Emergent impairment Problem Solving: Impaired Problem Solving Impairment: Verbal complex Executive Function: Reasoning, Sequencing, Decision Making Reasoning: Impaired Reasoning Impairment: Verbal basic, Verbal complex Sequencing: Impaired Sequencing Impairment: Verbal basic, Verbal complex Decision Making: Impaired Decision Making Impairment: Verbal basic, Verbal complex    Extremity Assessment (includes Sensation/Coordination)  Upper Extremity Assessment: Generalized weakness, RUE deficits/detail, LUE deficits/detail RUE Deficits / Details: WFL shoulder flexion to 90 degrees full extension of all digits and able to open and close on command- no concerns for contracture. UE appear WFL  RUE Coordination: decreased fine motor, decreased gross motor LUE Deficits / Details: shoulder flexion 90 degrees against gravity so 3 out 5 MMT, pt reaching for hand on command. pt open and closing hand on command . pt with full extension of all digits. pt does have nail mark noted in palm at this time and advised spouse to help keep pt hand in extensino when possible to allow the skin to heal. pt holding hand in fist at rest at times but is able to fully range. pt has palm guard in room and could possible benefit from sleeping guard to protect palm to help area heal. Pt with [facility] of elbow wrist hand and  shoulder at this time.  LUE Coordination: decreased fine motor  Lower Extremity Assessment: Defer to PT evaluation (holding B feet in plantarflexion)    ADLs  Overall ADL's : Needs assistance/impaired Grooming: Minimal assistance, Wash/dry hands, Sitting Grooming Details (indicate cue type and reason): sitting EOB to wipe hands with min assist for initation of task  Lower Body Bathing: Maximal assistance, Sitting/lateral leans Lower Body Bathing Details (indicate cue type and reason): applying lotion to BLEs with max assist for hand over hand support, forward lean and task attention  Functional mobility during ADLs: Maximal assistance, +2 for physical assistance, Caregiver able to provide necessary level of assistance General ADL Comments: Pt remains anxious with all movement, with max encouragement is able to complete    Mobility  Overal bed  mobility: Needs Assistance Bed Mobility: Supine to Sit, Rolling Rolling: Mod assist Sidelying to sit: Mod assist, +2 for physical assistance Supine to sit: Mod assist, +2 for physical assistance Sit to supine: Max assist, +2 for physical assistance Sit to sidelying: Mod assist, +2 for physical assistance, +2 for safety/equipment General bed mobility comments: assisted LE's and brought trunk up and forward with use of the pad to pivot.    Transfers  Overall transfer level: Needs assistance Equipment used: 2 person hand held assist Transfers: Sit to/from Stand, Stand Pivot Transfers Sit to Stand: Mod assist, +2 safety/equipment Stand pivot transfers: Mod assist, +2 physical assistance General transfer comment: cues for hand placement, utilized back of chair with therapists on either side to come to standing, working on standing balance and advancing legs forward and back, weightshifting, and marching in place. Due to rigidity and anxiety, requiring mod A x2 to complete activities in standing.    Ambulation / Gait / Stairs / Wheelchair Mobility   Ambulation/Gait Ambulation/Gait assistance: Max assist, Mod assist, +2 physical assistance Gait Distance (Feet): 3 Feet (forward and back.) Assistive device: 1 person hand held assist, 2 person hand held assist Gait Pattern/deviations: Step-to pattern General Gait Details: pt needed w/shift, stability and each LE advancement assist.  Pt generally unable to unweight or even slide her feet today. Gait velocity: reduced Gait velocity interpretation: <1.8 ft/sec, indicate of risk for recurrent falls    Posture / Balance Dynamic Sitting Balance Sitting balance - Comments: Requires BUE support, but with mutli-modal cues is able to maintain upright posture with no external assist (gaurding for safety) Balance Overall balance assessment: Needs assistance, History of Falls Sitting-balance support: Feet supported Sitting balance-Leahy Scale: Fair Sitting balance - Comments: Requires BUE support, but with mutli-modal cues is able to maintain upright posture with no external assist (gaurding for safety) Postural control: Right lateral lean Standing balance support: Bilateral upper extremity supported, During functional activity Standing balance-Leahy Scale: Poor Standing balance comment: stood for 10-12 min working on standing balance, w/shifting, pregait stepping, reliant on external support    Special needs/care consideration visitor is spouse, [name]. He at times has been staying with patient 24/7 in hospital due to her high anxiety   Previous Home Environment  Living Arrangements: Spouse/significant other  Lives With: Spouse Available Help at Discharge: Family, Available 24 hours/day Type of Home: House Home Layout: One level Home Access: Stairs to enter [employer] Shower/Tub: Chiropodist: Standard Bathroom Accessibility: Yes How Accessible: Accessible via walker  Discharge Living Setting Plans for Discharge Living Setting: Patient's home, Lives with (comment)  (spouse) Type of Home at Discharge: House Discharge Home Layout: One level Discharge Home Access: Stairs to enter Discharge Bathroom Shower/Tub: Tub/shower unit Discharge Bathroom Toilet: Standard Discharge Bathroom Accessibility: Yes How Accessible: Accessible via walker Does the patient have any problems obtaining your medications?: No  Social/Family/Support Systems Patient Roles: Spouse Contact Information: spouse, [name] Anticipated Caregiver: spouse Anticipated Caregiver's Contact Information: see above Ability/Limitations of Caregiver: no limitations Caregiver Availability: 24/7 Discharge Plan Discussed with Primary Caregiver: Yes Is Caregiver In Agreement with Plan?: Yes Does Caregiver/Family have Issues with Lodging/Transportation while Pt is in Rehab?: No  Goals Patient/Family Goal for Rehab: min assist with PT, OT, and SLP Expected length of stay: ELOS 2 weeks Pt/Family Agrees to Admission and willing to participate: Yes Program Orientation Provided & Reviewed with Pt/Caregiver Including Roles  & Responsibilities: Yes  Decrease burden of Care through IP rehab admission: n/a  Possible need for  SNF placement upon discharge:not anticipated  Patient Condition: This patient's medical and functional status has changed since the consult dated: 10/30/2019 in which the Rehabilitation Physician determined and documented that the patient's condition is appropriate for intensive rehabilitative care in an inpatient rehabilitation facility. See "History of Present Illness" (above) for medical update. Functional changes are: mod assist overall. Patient's medical and functional status update has been discussed with the Rehabilitation physician and patient remains appropriate for inpatient rehabilitation. Will admit to inpatient rehab today.  Preadmission Screen Completed By:  Cleatrice Burke, RN, 11/09/2019 10:20  AM2 ______________________________________________________________________   Discussed status with Dr. Naaman Plummer on 10/4/2021at 1021 and received approval for admission today.  Admission Coordinator:  Cleatrice Burke, time [address] 11/09/2019

## 2019-11-04 NOTE — Progress Notes (Signed)
Inpatient Rehabilitation Admissions Coordinator  Insurance has denied CIR approval. I met with patient and her spouse at bedside. Spouse requests expedited appeal which I will initiate today. Typically takes 24 to 498 hrs. I have alerted Acute team and TOC.I will follow up once I have heard final decision.  Danne Baxter, RN, MSN Rehab Admissions Coordinator [phone] 11/04/2019 4:46 PM

## 2019-11-04 NOTE — H&P (Signed)
Physical Medicine and Rehabilitation Admission H&P    Chief Complaint  Patient presents with  . Altered Mental Status  : HPI: Hannah Padilla is a 69 year old right-handed female with history of hyperthyroidism maintained on Tapazole, recurrent UTIs, [place] spotted fever.  Presented 10/20/2019 with progressive decline with noted apraxia over the last 3 months as well as intermittent panic attacks generalized weakness and falls.  Prior to onset of symptoms patient was healthy able to complete all of her ADLs without assistance.  Most recent MRI showed mild generalized cortical atrophy stable compared to scan of 08/11/2019.  Scattered T2 FLAIR hyperintense foci predominantly in the subcortical and deep white matter of the hemispheres most consistent with chronic microvascular ischemic change.  No acute findings.  EEG consistent with mild to moderate diffuse encephalopathy.  No seizure.  CT of the chest abdomen pelvis showed no evidence of primary metastatic disease.  Admission chemistries potassium 3.4 total protein 6.4 AST 49 ALT 50 hemoglobin 14.7 SARS coronavirus negative, TSH 0.851, free T4 1.96, T3 total 73, T4 total 10.5 ammonia level 17, ESR of 2, paraneoplastic CSF study negative.  Neurology follow-up suspect progressive dementia of unknown etiology.  She did receive 5 days of IV steroids during her hospital course as well as initiation of Sinemet for trial.  She is on a mechanical soft diet thin liquids.  Subcutaneous Lovenox for DVT prophylaxis.  Thyroid function studies were reviewed by endocrinology Dr. Buddy Duty who recommends follow-up outpatient with ambulatory referral with recommendations to continue Tapazole as well as propranolol.  Therapy evaluations completed and patient was admitted for a comprehensive rehab program.  Review of Systems  Constitutional: Positive for malaise/fatigue. Negative for fever.  HENT: Negative for hearing loss.   Eyes: Negative for blurred vision and  double vision.  Respiratory: Negative for cough and shortness of breath.   Cardiovascular: Positive for leg swelling. Negative for chest pain and palpitations.  Gastrointestinal: Positive for constipation. Negative for heartburn and nausea.  Genitourinary: Negative for dysuria, flank pain and hematuria.  Musculoskeletal: Positive for falls.  Skin: Negative for rash.  Neurological: Positive for tremors and weakness.  Psychiatric/Behavioral: Positive for hallucinations and memory loss. The patient has insomnia.        Panic attacks with mood changes as well as anxiety  All other systems reviewed and are negative.  Past Medical History:  Diagnosis Date  . Colon polyp 2015  . Elevated serum free T4 level    husband report  . Farsightedness    wears glasses  . Mood change 2021   major change in affect, health issues with multiple ED visits, hospitalization, starting 07/10/2019  . [facility] spotted fever) 2021  . Urinary tract infection    Past Surgical History:  Procedure Laterality Date  . BREAST REDUCTION SURGERY    . CARPAL TUNNEL RELEASE     right  . COLONOSCOPY  7829   [place],   . lichenoid keratosis  09/05/11   Skin, left anterior shouldee  . PARTIAL HYSTERECTOMY     still has ovaries  . WISDOM TOOTH EXTRACTION     Family History  Problem Relation Age of Onset  . Alzheimer's disease Mother   . Other Father        cognitive decline  . COPD Father        smoker  . Protein S deficiency Other   . Heart disease Neg Hx   . Diabetes Neg Hx   . Cancer Neg Hx   .  Stroke Neg Hx   . Hypertension Neg Hx   . Hyperlipidemia Neg Hx    Social History:  reports that she has never smoked. She has never used smokeless tobacco. She reports previous alcohol use of about 5.0 standard drinks of alcohol per week. She reports that she does not use drugs. Allergies: No Known Allergies Medications Prior to Admission  Medication Sig Dispense Refill  . acetaminophen (TYLENOL) 325  MG tablet Take 2 tablets (650 mg total) by mouth every 6 (six) hours as needed for mild pain (or Fever >/= 101).    . bisacodyl (DULCOLAX) 5 MG EC tablet Take 10 mg by mouth daily as needed for moderate constipation.    . diazepam (VALIUM) 5 MG tablet Take 1 tablet (5 mg total) by mouth every 12 (twelve) hours as needed for anxiety. (Patient taking differently: Take 2.5 mg by mouth every 12 (twelve) hours as needed for anxiety. ) 20 tablet 0  . megestrol (MEGACE) 40 MG/ML suspension Take 5 mLs (200 mg total) by mouth daily. (Patient taking differently: Take 400 mg by mouth daily. ) 240 mL 0  . methimazole (TAPAZOLE) 5 MG tablet Take 1 tablet (5 mg total) by mouth 3 (three) times daily. 45 tablet 0  . omeprazole (PRILOSEC) 40 MG capsule Take 1 capsule by mouth once daily (Patient taking differently: Take 40 mg by mouth daily. ) 30 capsule 0  . ondansetron (ZOFRAN-ODT) 8 MG disintegrating tablet Take 1 tablet (8 mg total) by mouth every 8 (eight) hours as needed. (Patient taking differently: Take 8 mg by mouth every 8 (eight) hours as needed for nausea or vomiting. ) 20 tablet 0  . propranolol ER (INDERAL LA) 60 MG 24 hr capsule Take 1 capsule (60 mg total) by mouth daily. 30 capsule 1  . sertraline (ZOLOFT) 50 MG tablet Take 50 mg by mouth daily.    . Vitamin D, Ergocalciferol, (DRISDOL) 1.25 MG (50000 UNIT) CAPS capsule Take 1 capsule (50,000 Units total) by mouth every 7 (seven) days. (Patient taking differently: Take 50,000 Units by mouth every Wednesday. ) 12 capsule 1    Drug Regimen Review Drug regimen was reviewed and remains appropriate with no significant issues identified  Home: Home Living Family/patient expects to be discharged to:: Private residence Living Arrangements: Spouse/significant other Available Help at Discharge: Family, Available 24 hours/day Type of Home: House Home Access: Stairs to enter Home Layout: One level Bathroom Shower/Tub: Tub/shower unit Home Equipment:  Bedside commode  Lives With: Spouse   Functional History: Prior Function Level of Independence: Needs assistance Gait / Transfers Assistance Needed: recently has needed help to walk and transfer, gait for very short trips of a few steps ADL's / Homemaking Assistance Needed: husband cares for home, assists wife to dress and bathe Communication / Swallowing Assistance Needed: soft vocal quality; mumbling at times and sometimes coherant Comments: per husband has times wiht blank stairing  Functional Status:  Mobility: Bed Mobility Overal bed mobility: Needs Assistance Bed Mobility: Supine to Sit, Sit to Supine Rolling: Mod assist Sidelying to sit: Max assist Supine to sit: Mod assist, +2 for physical assistance Sit to supine: Max assist, +2 for physical assistance Sit to sidelying: Mod assist, +2 for physical assistance, +2 for safety/equipment General bed mobility comments: assisted LE's and brought trunk up and forward with use of the pad to pivot. Transfers Overall transfer level: Needs assistance Equipment used: 2 person hand held assist Transfers: Sit to/from Stand, Stand Pivot Transfers Sit to Stand: Mod  assist, +2 safety/equipment (x3) Stand pivot transfers: Mod assist, +2 physical assistance General transfer comment: cues for hand placement, assist to come forward and  and boost.  With a face to face assist, pt able to w/shift and move her feet in a more "tin-soldier-like" pattern to step forward and back in pivot to chair. Ambulation/Gait Ambulation/Gait assistance: Max assist, Mod assist, +2 physical assistance Gait Distance (Feet): 3 Feet (forward and back.) Assistive device: 1 person hand held assist, 2 person hand held assist Gait Pattern/deviations: Step-to pattern General Gait Details: pt needed w/shift, stability and each LE advancement assist.  Pt generally unable to unweight or even slide her feet today. Gait velocity: reduced Gait velocity interpretation: <1.8  ft/sec, indicate of risk for recurrent falls    ADL: ADL Overall ADL's : Needs assistance/impaired Grooming: Minimal assistance, Wash/dry hands, Sitting Grooming Details (indicate cue type and reason): sitting EOB to wipe hands with min assist for initation of task  Lower Body Bathing: Maximal assistance, Sitting/lateral leans Lower Body Bathing Details (indicate cue type and reason): applying lotion to BLEs with max assist for hand over hand support, forward lean and task attention  Functional mobility during ADLs: Maximal assistance, +2 for physical assistance, Caregiver able to provide necessary level of assistance General ADL Comments: total A for ADL tasks  Cognition: Cognition Overall Cognitive Status: Impaired/Different from baseline Arousal/Alertness: Lethargic Orientation Level: Oriented to person, Disoriented to place, Disoriented to time, Disoriented to situation Attention: Focused, Sustained, Selective Focused Attention: Impaired Focused Attention Impairment: Verbal basic Sustained Attention: Impaired Sustained Attention Impairment: Verbal basic Memory: Impaired Memory Impairment: Storage deficit, Decreased recall of new information Awareness: Impaired Awareness Impairment: Emergent impairment Problem Solving: Impaired Problem Solving Impairment: Verbal complex Executive Function: Reasoning, Sequencing, Decision Making Reasoning: Impaired Reasoning Impairment: Verbal basic, Verbal complex Sequencing: Impaired Sequencing Impairment: Verbal basic, Verbal complex Decision Making: Impaired Decision Making Impairment: Verbal basic, Verbal complex Cognition Arousal/Alertness: Awake/alert Behavior During Therapy: Anxious, Flat affect Overall Cognitive Status: Impaired/Different from baseline Area of Impairment: Attention, Safety/judgement, Problem solving, Awareness, Following commands, Memory Orientation Level: Situation Current Attention Level: Sustained Memory:  Decreased short-term memory Following Commands: Follows one step commands inconsistently Safety/Judgement: Decreased awareness of safety Awareness: Intellectual Problem Solving: Slow processing, Requires verbal cues, Requires tactile cues, Decreased initiation, Difficulty sequencing General Comments: pt anxious and reports fatigued, husband assuring her throughout session.  Follows simple commands with increased time but requires multimodal cueing for initation, completion and sequencing of tasks  Difficult to assess due to: Impaired communication  Physical Exam: Blood pressure (!) 141/97, pulse 87, temperature 98.4 F (36.9 C), temperature source Oral, resp. rate 17, SpO2 96 %. Physical Exam Constitutional:      Comments: Frail, shaking  HENT:     Head: Normocephalic.     Right Ear: External ear normal.     Left Ear: External ear normal.     Nose: Nose normal.     Mouth/Throat:     Pharynx: No oropharyngeal exudate or posterior oropharyngeal erythema.  Eyes:     Extraocular Movements: Extraocular movements intact.     Pupils: Pupils are equal, round, and reactive to light.  Cardiovascular:     Rate and Rhythm: Normal rate and regular rhythm.     Heart sounds: No murmur heard.  No friction rub.  Pulmonary:     Effort: Pulmonary effort is normal. No respiratory distress.     Breath sounds: No wheezing.  Abdominal:     General: Bowel sounds are normal. There  is no distension.     Tenderness: There is no abdominal tenderness.  Musculoskeletal:        General: Tenderness present.     Cervical back: Normal range of motion. No tenderness.  Skin:    General: Skin is warm.     Coloration: Skin is pale.  Neurological:     Comments: Pt is alert. Tends to stare forward unless cued. Pill rolling tremor Right hand, keeps left hand clenched. Can move RUE with cueing and extra time. Seems to move legs more freely but needs cues also. DTR's 2+. Decreased attention, initiation. Speech  dysarthric. Insight fair but limited. Anxiety seems to fuel lack of attention and tremors. Motor exam difficult to assess d/t inconsistent effort. Senses pain in all 4's.   Psychiatric:     Comments: Highly anxious, anxiety often increased by topics of conversation.      Results for orders placed or performed during the hospital encounter of 10/20/19 (from the past 48 hour(s))  Glucose, capillary     Status: None   Collection Time: 11/02/19 11:23 AM  Result Value Ref Range   Glucose-Capillary 89 70 - 99 mg/dL    Comment: Glucose reference range applies only to samples taken after fasting for at least 8 hours.   Comment 1 Notify RN    Comment 2 Document in Chart    No results found.     Medical Problem List and Plan: 1.  Progressive decrease in functional mobility, impaired cognition, apraxia with panic attacks generalized weakness and falls secondary to acute metabolic encephalopathy/progressive dementia of unknown etiology.    -f/u lab work up as ordered by neurology  -Continue Sinemet trial 25-100 mg 3 times daily  -patient may shower  -ELOS/Goals: 14-16 days, min assist goals with PT, OT, SLP 2.  Antithrombotics: -DVT/anticoagulation: Lovenox  -antiplatelet therapy: N/A 3. Pain Management: Tylenol as needed 4. Mood: Zoloft 50 mg daily, Valium 12.52m q12 prn although it appears that she has been using more often than not at home.  -will ask neuropsychology to see as well  -pt sees ?psychiatry as outpt  -antipsychotic agents: N/A 5. Neuropsych: This patient is not capable of making decisions on her own behalf. 6. Skin/Wound Care: Routine skin checks 7. Fluids/Electrolytes/Nutrition: Routine in and outs with follow-up chemistries 8.  Hyper thyroidism.  Continue Tapazole 5 mg 3 times daily as well as Inderal 60 mg daily.  Patient is to see Dr. KBuddy Dutyof endocrinology outpatient        DLavon PaganiniAngiulli, PA-C 11/04/2019

## 2019-11-04 NOTE — Progress Notes (Signed)
Inpatient Rehabilitation Admissions Coordinator  Peer to peer has been completed. I faxed updated therapy progress over the last 24 hours per MD request. I await insurance approval and bed availability for a possible CIR admit.  Danne Baxter, RN, MSN Rehab Admissions Coordinator [phone] 11/04/2019 1:50 PM

## 2019-11-04 NOTE — Discharge Summary (Addendum)
Physician Discharge Summary  Hannah Padilla VZD:[id number] DOB: [birth year] DOA: 10/20/2019  PCP: Carlena Hurl, PA-C  Admit date: 10/20/2019 Discharge date: 11/08/2019  Admitted From: Home Disposition: CIR vs SNF depending on approval  Recommendations for Outpatient Follow-up:  1. Follow up with PCP in 1-2 weeks 2. Please obtain BMP/CBC in one week your next doctors visit.    Discharge Condition: Stable CODE STATUS: Full code Diet recommendation: Regular dysphagia 3 diet  Brief/Interim Summary: 69 year old female with history of subclinical hyperthyroidism, RMSF, UTIs presented with progressive decline over the last 3 months. Prior to the onset of symptoms patient was healthy and able to complete all of her ADLs without any assistance. Per son, her PCP had diagnosed her with hyperthyroidism, and was placed on methimazole and propranolol. Reported that she had been having panic attacks, tremor, intermittent sweating, generalized weakness, falls, difficulty swallowing, hallucinations, insomnia and bowel and urine incontinence. Patient had extensive work-up including lumbar puncture, MRI, pheochromocytoma work-up, RMSF was positive. Completed 10 days of doxycycline. Patient is currently followed outpatient by Dr. Jannifer Franklin( outpatient neurologist). She was placed on Valium which helped somewhat with the panic attacks, Seroquel was attempted however patient became catatonic and this was discontinued after 2 doses. Patient was admitted for further work-up. Neurology was suspecting CJD remains very possible cause for her rapid neurological decline. Ordered 24-hour urine collection for heavy metals, Porphobilinogen, delta alae, results pending. Patient had shownsome signs of improvement after IV steroids, husband reports improvement with sinemet and B12 supplemntation. Neurology recommended would hold on IVIG at this point given improvement in her symptoms. PT recommending rehab.  Medically stable,  pending CIR.  In my opinion patient would really benefit from CIR.  Peer-to-peer performed by me with Dr. Debby Bud from [place].  Currently awaiting decision otherwise medically stable to be discharged.   Assessment & Plan:   Principal Problem:   Metabolic encephalopathy Active Problems:   Anxiety   Transaminitis   Subclinical hyperthyroidism   Dysphagia   Acute metabolic encephalopathy   Acute metabolic encephalopathy with relatively rapid and progressive decline.    Improving -Has undergone extensive work-up with neurology including heavy metal testing, EEG, paraneoplastic panel. Thus far work-up has been negative. -Has shown slow improvement with 5 days of IV steroids. -Currently holding off on IVIG treatment. -Vitamin B12 supplementation -Sinemet 3 times daily  Subclinical hyperthyroidism -Continue Home regimen includes methimazole 5 mg 3 times daily.  Admitting physician, Dr. Fernande Boyden discussed with Dr. Delrae Rend. -TSH 0.8, free T4 1.96 (improving from 2.33), total T4 10.5, T3 73 -Dr. Buddy Duty will be able to see her outpatient,will need ambulatory referralto Dr. Kerr-endocrinology at the time of discharge -Continue methimazole and propranolol.  Anxiety, insomnia. -Currently on Valium 2.5 mg every 12 hours. Seroquel 50 mg daily  Transaminitis -LFTs improving.  Dysphagia SLP evaluationcompleted on 9/16, recommended dysphagia 3 diet with thin liquids  History of [place] spotted fever-treated with 10 days of oral doxycycline outpatient.  PT recommendations-CIR.  Patient is showing quite a bit of improvement in her physical ability with therapy.  In the meantime would benefit from medical supervision in terms of her anxiety and encephalopathy.  Perform peer-to-peer by me this morning with Dr Debby Bud from [place].  She requested updated physical therapy notes to make final determination.  I have notified TOC team regarding this who will  coordinate.  Otherwise medically stable to be discharged.  There is no height or weight on file to calculate BMI.  Discharge Diagnoses:  Principal Problem:   Metabolic encephalopathy Active Problems:   Anxiety   Transaminitis   Subclinical hyperthyroidism   Dysphagia   Acute metabolic encephalopathy      Consultations:  Neurology  Subjective: Patient feels well, during my visit she was getting some physical therapy by patient's husband and the nursing tech.  Patient is showing good improvement with assistance.  Her anxiety appears to be improving as well.  All the questions were husband and patient answered.  Discharge Exam: Vitals:   11/08/19 0008 11/08/19 0413  BP: 104/74 112/71  Pulse: 73 71  Resp: 18 16  Temp: 97.6 F (36.4 C) 98 F (36.7 C)  SpO2: 99% 96%   Vitals:   11/07/19 1536 11/07/19 1954 11/08/19 0008 11/08/19 0413  BP: 112/82 100/74 104/74 112/71  Pulse: 80 71 73 71  Resp: 16 17 18 16   Temp: 98.2 F (36.8 C) 98 F (36.7 C) 97.6 F (36.4 C) 98 F (36.7 C)  TempSrc: Oral Oral Oral Oral  SpO2: 97% 99% 99% 96%    General: Pt is alert, awake, not in acute distress.  Chronically ill-appearing. Cardiovascular: RRR, S1/S2 +, no rubs, no gallops Respiratory: CTA bilaterally, no wheezing, no rhonchi Abdominal: Soft, NT, ND, bowel sounds + Extremities: no edema, no cyanosis  Discharge Instructions  Discharge Instructions    Ambulatory referral to Endocrinology   Complete by: As directed      Allergies as of 11/08/2019   No Known Allergies     Medication List    STOP taking these medications   megestrol 40 MG/ML suspension Commonly known as: MEGACE     TAKE these medications   acetaminophen 325 MG tablet Commonly known as: TYLENOL Take 2 tablets (650 mg total) by mouth every 6 (six) hours as needed for mild pain (or Fever >/= 101).   bisacodyl 5 MG EC tablet Commonly known as: DULCOLAX Take 10 mg by mouth daily as  needed for moderate constipation. What changed: Another medication with the same name was added. Make sure you understand how and when to take each.   bisacodyl 10 MG suppository Commonly known as: DULCOLAX Place 1 suppository (10 mg total) rectally daily as needed for moderate constipation. What changed: You were already taking a medication with the same name, and this prescription was added. Make sure you understand how and when to take each.   carbidopa-levodopa 25-100 MG tablet Commonly known as: SINEMET IR Take 1 tablet by mouth 3 (three) times daily.   diazepam 5 MG tablet Commonly known as: Valium Take 0.5 tablets (2.5 mg total) by mouth every 12 (twelve) hours as needed for anxiety (sleep). What changed:   how much to take  reasons to take this   methimazole 5 MG tablet Commonly known as: TAPAZOLE Take 1 tablet (5 mg total) by mouth 3 (three) times daily.   omeprazole 40 MG capsule Commonly known as: PRILOSEC Take 1 capsule by mouth once daily   ondansetron 8 MG disintegrating tablet Commonly known as: ZOFRAN-ODT Take 1 tablet (8 mg total) by mouth every 8 (eight) hours as needed. What changed: reasons to take this   polyethylene glycol 17 g packet Commonly known as: MIRALAX / GLYCOLAX Take 17 g by mouth daily.   propranolol ER 60 MG 24 hr capsule Commonly known as: Inderal LA Take 1 capsule (60 mg total) by mouth daily.   sertraline 50 MG tablet Commonly known as: ZOLOFT Take 50 mg by mouth daily.  Vitamin D (Ergocalciferol) 1.25 MG (50000 UNIT) Caps capsule Commonly known as: DRISDOL Take 1 capsule (50,000 Units total) by mouth every 7 (seven) days. What changed: when to take this       Follow-up Information    Tysinger, Camelia Eng, PA-C. Schedule an appointment as soon as possible for a visit in 1 week(s).   Specialty: Family Medicine Contact information: [place] [address] [phone]        Delrae Rend, MD. Schedule an  appointment as soon as possible for a visit in 2 week(s).   Specialty: Endocrinology Contact information: [address] [place] [address] [phone]              No Known Allergies  You were cared for by a hospitalist during your hospital stay. If you have any questions about your discharge medications or the care you received while you were in the hospital after you are discharged, you can call the unit and asked to speak with the hospitalist on call if the hospitalist that took care of you is not available. Once you are discharged, your primary care physician will handle any further medical issues. Please note that no refills for any discharge medications will be authorized once you are discharged, as it is imperative that you return to your primary care physician (or establish a relationship with a primary care physician if you do not have one) for your aftercare needs so that they can reassess your need for medications and monitor your lab values.   Procedures/Studies: MR BRAIN W WO CONTRAST  Result Date: 10/11/2019  [facility] [address], [place], [place] [address] [phone] NEUROIMAGING REPORT STUDY DATE: 10/10/2019 PATIENT NAME: Hannah Padilla DOB: Jan 20, 1951 MRN: [id number] EXAM: MRI Brain with and without contrast ORDERING CLINICIAN: Kathrynn Ducking, MD CLINICAL HISTORY: 69 year old woman with encephalopathy COMPARISON FILMS: MRI 08/11/2019 TECHNIQUE:MRI of the brain with and without contrast was obtained utilizing 5 mm axial slices with T1, T2, T2 flair, SWI and diffusion weighted views.  T1 sagittal, T2 coronal and postcontrast views in the axial and coronal plane were obtained. CONTRAST: 12 ml [facility] IMAGING SITE: [facility], [place]. FINDINGS: On sagittal images, the spinal cord is imaged caudally to C3 and is normal in caliber.   The contents of the posterior fossa are of normal size and position.   The  pituitary gland and optic chiasm appear normal.    There is mild generalized cortical atrophy.  Ventricles are not distorted.  There are no abnormal extra-axial collections of fluid.  In the hemispheres, there are some scattered T2/FLAIR hyperintense foci predominantly in the subcortical and deep white matter.  None of the foci appear to be acute.  They do not enhance there do not appear to be new lesions compared to the 08/11/2019 MRI.  The cerebellum and brainstem appears normal.   The deep gray matter appears normal.  Diffusion weighted images are normal.  Susceptibility weighted images are normal.   The orbits appear normal.   The VIIth/VIIIth nerve complex appears normal.  The mastoid air cells appear normal.  The paranasal sinuses appear normal.  Flow voids are identified within the major intracerebral arteries.  After the infusion of contrast material, a normal enhancement pattern is noted.   This MRI of the brain with and without contrast shows the following: 1.   Mild generalized cortical atrophy, stable compared to the 08/11/2019 MRI. 2.   Scattered  T2/FLAIR hyperintense foci, predominantly in the subcortical and deep white matter of the hemispheres, most consistent with chronic microvascular ischemic change.  This is stable compared to the 08/11/2019 MRI. 3.   Normal enhancement pattern no acute findings. INTERPRETING PHYSICIAN: Richard A. Felecia Shelling, MD, PhD, FAAN Certified in  Neuroimaging by [employer]   CT CHEST ABDOMEN PELVIS W CONTRAST  Result Date: 10/26/2019 CLINICAL DATA:  Rapidly progressive dementia, rule out malignancy EXAM: CT CHEST, ABDOMEN, AND PELVIS WITH CONTRAST TECHNIQUE: Multidetector CT imaging of the chest, abdomen and pelvis was performed following the standard protocol during bolus administration of intravenous contrast. CONTRAST:  142mL OMNIPAQUE IOHEXOL 300 MG/ML  SOLN COMPARISON:  08/31/2019 FINDINGS: CT CHEST FINDINGS Cardiovascular: Heart size normal. No  pericardial effusion. Central pulmonary arteries unremarkable. Adequate contrast opacification of the thoracic aorta with no evidence of dissection, aneurysm, or stenosis. There is classic 3-vessel brachiocephalic arch anatomy without proximal stenosis. No significant atheromatous change. Mediastinum/Nodes: No mass or adenopathy. Lungs/Pleura: No pleural effusion. No pneumothorax. Linear scarring or subsegmental atelectasis medially in the left lower lobe. Dependent atelectasis posteriorly in both lower lobes. Musculoskeletal: Orthopedic anchor in the right humeral head. Mild superior endplate compression deformities of T8 and T10. No definite acute fracture. CT ABDOMEN PELVIS FINDINGS Hepatobiliary: No focal liver abnormality is seen. No gallstones, gallbladder wall thickening, or biliary dilatation. Pancreas: Unremarkable. No pancreatic ductal dilatation or surrounding inflammatory changes. Spleen: Normal in size without focal abnormality. Adrenals/Urinary Tract: Stable left adrenal fullness. Kidneys enhance normally without mass or hydronephrosis. Urinary bladder physiologically distended. Stomach/Bowel: Stomach is nondistended. Small bowel decompressed. Appendix not discretely identified. No pericecal inflammatory/edematous change. The colon is nondilated, unremarkable. Vascular/Lymphatic: Minimal calcified aortoiliac plaque. No aneurysm or stenosis. Portal vein patent. No abdominal or pelvic adenopathy. Reproductive: Status post hysterectomy. No adnexal masses. Other: No ascites.  Bilateral pelvic phleboliths.  No free air. Musculoskeletal: Small supraumbilical hernia containing only mesenteric fat. Lumbar levoscoliosis apex L2 without underlying vertebral anomaly. No fracture or worrisome bone lesion. IMPRESSION: 1. No evidence of primary or metastatic disease. 2. Small supraumbilical hernia containing only mesenteric fat. Aortic Atherosclerosis (ICD10-I70.0). Electronically Signed   By: Lucrezia Europe M.D.   On:  10/26/2019 14:39   EEG adult  Result Date: 10/21/2019 Lora Havens, MD     10/21/2019  7:50 AM Patient Name: Hannah Padilla MRN: [id number] Epilepsy Attending: Lora Havens Referring Physician/Provider: Dr Roland Rack Date: 10/20/2019 Duration: 24.87mins Patient history: 69yo F with ams, hallucination, concern for rapidly progressing dementia. EEG to evaluate for seizure. Level of alertness: Awake AEDs during EEG study: None Technical aspects: This EEG study was done with scalp electrodes positioned according to the 10-20 International system of electrode placement. Electrical activity was acquired at a sampling rate of 500Hz  and reviewed with a high frequency filter of 70Hz  and a low frequency filter of 1Hz . EEG data were recorded continuously and digitally stored. Description: No clear posterior dominant rhythm was seen. EEG showed continuous generalized polymorphic 6-9Hz  theta-alpha activity. Two episodes of right hand tremor like movements were recorded. Concomitant eeg before, during and after the episodes didn't show eeg change to suggest seizure.   Hyperventilation and photic stimulation were not performed.   ABNORMALITY -Continuous slow, generalized IMPRESSION: This study is suggestive of mild to moderate diffuse encephalopathy, nonspecific etiology. No seizures or epileptiform discharges were seen throughout the recording. Tow episodes of right hand tremor like movements were recorded without concomitant eeg change and were most likely not epileptic. However,  focal motor seizures may not be seen on scalp eeg. Therefore, clinical correlation is recommended. Priyanka [name]   DG FL GUIDED LUMBAR PUNCTURE  Result Date: 10/15/2019 CLINICAL DATA:  Rapidly progressive dementia. EXAM: DIAGNOSTIC LUMBAR PUNCTURE UNDER FLUOROSCOPIC GUIDANCE FLUOROSCOPY TIME:  Fluoroscopy time: 14 seconds Radiation Exposure Index (if provided by the fluoroscopic device): 1.0 mGy Number of Acquired Spot Images: 0  PROCEDURE: Informed consent was obtained from the patient prior to the procedure, including potential complications of headache, allergy, and pain. With the patient left lateral decubitus, the lower back was prepped with Betadine. 1% Lidocaine was used for local anesthesia. Lumbar puncture was performed at the L3-L4 level via a left interlaminar approach using a 3.5 inch 20 gauge needle with return of clear, colorless CSF with an opening pressure of 14 cm water. 14 ml of CSF were obtained for laboratory studies. The patient tolerated the procedure well and there were no apparent complications. IMPRESSION: Technically successful fluoroscopically guided lumbar puncture. Electronically Signed   By: Titus Dubin M.D.   On: 10/15/2019 09:22     The results of significant diagnostics from this hospitalization (including imaging, microbiology, ancillary and laboratory) are listed below for reference.     Microbiology: No results found for this or any previous visit (from the past 240 hour(s)).   Labs: BNP (last 3 results) No results for input(s): BNP in the last 8760 hours. Basic Metabolic Panel: No results for input(s): NA, K, CL, CO2, GLUCOSE, BUN, CREATININE, CALCIUM, MG, PHOS in the last 168 hours. Liver Function Tests: No results for input(s): AST, ALT, ALKPHOS, BILITOT, PROT, ALBUMIN in the last 168 hours. No results for input(s): LIPASE, AMYLASE in the last 168 hours. No results for input(s): AMMONIA in the last 168 hours. CBC: No results for input(s): WBC, NEUTROABS, HGB, HCT, MCV, PLT in the last 168 hours. Cardiac Enzymes: No results for input(s): CKTOTAL, CKMB, CKMBINDEX, TROPONINI in the last 168 hours. BNP: Invalid input(s): POCBNP CBG: Recent Labs  Lab 11/02/19 1123 11/06/19 0935  GLUCAP 89 104*   D-Dimer No results for input(s): DDIMER in the last 72 hours. Hgb A1c No results for input(s): HGBA1C in the last 72 hours. Lipid Profile No results for input(s): CHOL, HDL,  LDLCALC, TRIG, CHOLHDL, LDLDIRECT in the last 72 hours. Thyroid function studies No results for input(s): TSH, T4TOTAL, T3FREE, THYROIDAB in the last 72 hours.  Invalid input(s): FREET3 Anemia work up No results for input(s): VITAMINB12, FOLATE, FERRITIN, TIBC, IRON, RETICCTPCT in the last 72 hours. Urinalysis    Component Value Date/Time   COLORURINE YELLOW 10/20/2019 1151   APPEARANCEUR CLEAR 10/20/2019 1151   LABSPEC 1.012 10/20/2019 1151   LABSPEC 1.015 10/05/2019 0929   PHURINE 6.0 10/20/2019 1151   GLUCOSEU NEGATIVE 10/20/2019 1151   HGBUR NEGATIVE 10/20/2019 1151   BILIRUBINUR NEGATIVE 10/20/2019 1151   BILIRUBINUR negative 10/05/2019 0929   BILIRUBINUR NEG 11/26/2011 0931   KETONESUR NEGATIVE 10/20/2019 1151   PROTEINUR NEGATIVE 10/20/2019 1151   UROBILINOGEN negative 11/26/2011 0931   UROBILINOGEN 0.2 03/25/2011 0633   NITRITE NEGATIVE 10/20/2019 1151   LEUKOCYTESUR NEGATIVE 10/20/2019 1151   Sepsis Labs Invalid input(s): PROCALCITONIN,  WBC,  LACTICIDVEN Microbiology No results found for this or any previous visit (from the past 240 hour(s)).   Time coordinating discharge:  I have spent 35 minutes face to face with the patient and on the ward discussing the patients care, assessment, plan and disposition with other care givers. >50% of the time was devoted  counseling the patient about the risks and benefits of treatment/Discharge disposition and coordinating care.   SIGNED:   Damita Lack, MD  [facility] 11/08/2019, 7:36 AM   If 7PM-7AM, please contact night-coverage

## 2019-11-05 NOTE — Progress Notes (Signed)
Occupational Therapy Treatment Patient Details Name: Hannah Padilla MRN: [id number] DOB: March 22, 1950 Today's Date: 11/05/2019    History of present illness 69 yo female with onset of Parkinson's like symptoms was admitted to ED, with recurrent changes of metabolic encephalopathy, AMS, panic attacks, and tremors. Pt is rapidly declining in her mobility to approach dependent care.  Neurologists are working to diagnose her condition. PMHx:  Rocky mountain spotted fever, UTI, dementia, incontinence, colon polyp, mood changes, hyperthyroidism, anxiety,    OT comments  Patient continues to make steady progress towards goals in skilled OT session. Patient's session encompassed co-treat with PT in order to further progress in seated balance to complete functional tasks, standing balance, and neuromuscular re-education. With completing sit<>stand transfers, pt requires cues for hand placement, and utilized back of chair with therapists on either side to come to standing, working on standing balance and advancing legs forward and back, weightshifting, and marching in place. Due to rigidity and anxiety, requiring mod A x2 to complete activities in standing. Pt noted to have increased ability to mobilize RLE, but requires increased assist (min-mod) to engage RLE. Discharge remains appropriate, will continue to follow acutely.    Follow Up Recommendations  CIR;Supervision/Assistance - 24 hour    Equipment Recommendations  None recommended by OT    Recommendations for Other Services      Precautions / Restrictions Precautions Precautions: Fall Precaution Comments: tone and tremors Restrictions Weight Bearing Restrictions: No       Mobility Bed Mobility Overal bed mobility: Needs Assistance Bed Mobility: Supine to Sit;Rolling Rolling: Mod assist Sidelying to sit: Mod assist;+2 for physical assistance       General bed mobility comments: assisted LE's and brought trunk up and forward with use of the  pad to pivot.  Transfers Overall transfer level: Needs assistance Equipment used: 2 person hand held assist Transfers: Sit to/from [facility] Sit to Stand: Mod assist;+2 safety/equipment Stand pivot transfers: Mod assist;+2 physical assistance       General transfer comment: cues for hand placement, utilized back of chair with therapists on either side to come to standing, working on standing balance and advancing legs forward and back, weightshifting, and marching in place. Due to rigidity and anxiety, requiring mod A x2 to complete activities in standing.    Balance Overall balance assessment: Needs assistance;History of Falls Sitting-balance support: Feet supported Sitting balance-Leahy Scale: Fair Sitting balance - Comments: Requires BUE support, but with mutli-modal cues is able to maintain upright posture with no external assist (gaurding for safety)   Standing balance support: Bilateral upper extremity supported;During functional activity Standing balance-Leahy Scale: Poor Standing balance comment: reliant on external support                           ADL either performed or assessed with clinical judgement   ADL Overall ADL's : Needs assistance/impaired                                     Functional mobility during ADLs: Maximal assistance;+2 for physical assistance;Caregiver able to provide necessary level of assistance General ADL Comments: Pt remains anxious with all movement, with max encouragement is able to complete     Vision       Perception     Praxis      Cognition Arousal/Alertness: Awake/alert Behavior During Therapy: Anxious;Flat affect Overall Cognitive  Status: Impaired/Different from baseline Area of Impairment: Attention;Safety/judgement;Problem solving;Awareness;Following commands;Memory                   Current Attention Level: Sustained Memory: Decreased short-term memory Following  Commands: Follows one step commands consistently Safety/Judgement: Decreased awareness of safety Awareness: Intellectual Problem Solving: Slow processing;Requires verbal cues;Requires tactile cues;Decreased initiation;Difficulty sequencing General Comments: pt remains anxious but is motivated, is able to follow one step commands with increased accuracy (less reliance on multi-modal cues but still needed), but continues to demonstrate deficits for multi-step commands        Exercises     Shoulder Instructions       General Comments      Pertinent Vitals/ Pain       Pain Assessment: Faces Faces Pain Scale: No hurt  Home Living                                          Prior Functioning/Environment              Frequency  Min 2X/week        Progress Toward Goals  OT Goals(current goals can now be found in the care plan section)  Progress towards OT goals: Progressing toward goals  Acute Rehab OT Goals Patient Stated Goal: to get home with husband OT Goal Formulation: With patient Time For Goal Achievement: 11/11/19 Potential to Achieve Goals: Good  Plan Discharge plan remains appropriate    Co-evaluation      Reason for Co-Treatment: Complexity of the patient's impairments (multi-system involvement);Necessary to address cognition/behavior during functional activity;For patient/therapist safety;To address functional/ADL transfers PT goals addressed during session: Mobility/safety with mobility;Strengthening/ROM OT goals addressed during session: ADL's and self-care;Strengthening/ROM      AM-PAC OT "6 Clicks" Daily Activity     Outcome Measure   Help from another person eating meals?: A Lot Help from another person taking care of personal grooming?: A Lot Help from another person toileting, which includes using toliet, bedpan, or urinal?: Total Help from another person bathing (including washing, rinsing, drying)?: A Lot Help from another  person to put on and taking off regular upper body clothing?: A Lot Help from another person to put on and taking off regular lower body clothing?: Total 6 Click Score: 10    End of Session    OT Visit Diagnosis: Unsteadiness on feet (R26.81);Other abnormalities of gait and mobility (R26.89);Muscle weakness (generalized) (M62.81);Other symptoms and signs involving cognitive function   Activity Tolerance Patient tolerated treatment well   Patient Left in chair;with call bell/phone within reach;with family/visitor present   Nurse Communication Mobility status        Time: 8466-5993 OT Time Calculation (min): 26 min  Charges: OT General Charges $OT Visit: 1 Visit OT Treatments $Neuromuscular Re-education: 8-22 mins  Corinne Ports E. Phat Dalton, COTA/L Acute Rehabilitation Services [place] 11/05/2019, 2:22 PM

## 2019-11-05 NOTE — Progress Notes (Signed)
Physical Therapy Treatment Patient Details Name: Hannah Padilla MRN: [id number] DOB: [birth year] Today's Date: 11/05/2019    History of Present Illness 69 yo female with onset of Parkinson's like symptoms was admitted to ED, with recurrent changes of metabolic encephalopathy, AMS, panic attacks, and tremors. Pt is rapidly declining in her mobility to approach dependent care.  Neurologists are working to diagnose her condition. PMHx:  Rocky mountain spotted fever, UTI, dementia, incontinence, colon polyp, mood changes, hyperthyroidism, anxiety,     PT Comments    Pt anxious, but always ready to work at any task.  Today, stress on transitions, sitting balance and a significant amount of time standing at EOB holding to a chair back and working on pre gait and decreasing tone/dissociating upper and lower trunk, before pivoting over to the chair.    Follow Up Recommendations  CIR     Equipment Recommendations  None recommended by PT    Recommendations for Other Services       Precautions / Restrictions Precautions Precautions: Fall Precaution Comments: tone and tremors Restrictions Weight Bearing Restrictions: No    Mobility  Bed Mobility Overal bed mobility: Needs Assistance Bed Mobility: Supine to Sit;Rolling Rolling: Mod assist Sidelying to sit: Mod assist;+2 for physical assistance       General bed mobility comments: assisted LE's and brought trunk up and forward with use of the pad to pivot.  Transfers Overall transfer level: Needs assistance Equipment used: 2 person hand held assist Transfers: Sit to/from [facility] Sit to Stand: Mod assist;+2 safety/equipment Stand pivot transfers: Mod assist;+2 physical assistance       General transfer comment: cues for hand placement, utilized back of chair with therapists on either side to come to standing, working on standing balance and advancing legs forward and back, weightshifting, and marching in place. Due  to rigidity and anxiety, requiring mod A x2 to complete activities in standing.  Ambulation/Gait                 Stairs             Wheelchair Mobility    Modified Rankin (Stroke Patients Only)       Balance Overall balance assessment: Needs assistance;History of Falls Sitting-balance support: Feet supported Sitting balance-Leahy Scale: Fair Sitting balance - Comments: Requires BUE support, but with mutli-modal cues is able to maintain upright posture with no external assist (gaurding for safety)   Standing balance support: Bilateral upper extremity supported;During functional activity Standing balance-Leahy Scale: Poor Standing balance comment: stood for 10-12 min working on standing balance, w/shifting, pregait stepping, reliant on external support                            Cognition Arousal/Alertness: Awake/alert Behavior During Therapy: Anxious;Flat affect Overall Cognitive Status: Impaired/Different from baseline Area of Impairment: Attention;Safety/judgement;Problem solving;Awareness;Following commands;Memory                 Orientation Level: Situation Current Attention Level: Sustained Memory: Decreased short-term memory Following Commands: Follows one step commands consistently Safety/Judgement: Decreased awareness of safety Awareness: Intellectual Problem Solving: Slow processing;Requires verbal cues;Requires tactile cues;Decreased initiation;Difficulty sequencing General Comments: pt remains anxious but is motivated, is able to follow one step commands with increased accuracy (less reliance on multi-modal cues but still needed), but continues to demonstrate deficits for multi-step commands      Exercises      General Comments  Pertinent Vitals/Pain Pain Assessment: Faces Faces Pain Scale: No hurt Pain Intervention(s): Monitored during session    Home Living                      Prior Function             PT Goals (current goals can now be found in the care plan section) Acute Rehab PT Goals Patient Stated Goal: to get home with husband PT Goal Formulation: With family Time For Goal Achievement: 11/18/19 Potential to Achieve Goals: Fair Progress towards PT goals: Progressing toward goals    Frequency    Min 3X/week      PT Plan Current plan remains appropriate    Co-evaluation PT/OT/SLP Co-Evaluation/Treatment: Yes Reason for Co-Treatment: Complexity of the patient's impairments (multi-system involvement);For patient/therapist safety PT goals addressed during session: Mobility/safety with mobility OT goals addressed during session: ADL's and self-care      AM-PAC PT "6 Clicks" Mobility   Outcome Measure  Help needed turning from your back to your side while in a flat bed without using bedrails?: A Lot Help needed moving from lying on your back to sitting on the side of a flat bed without using bedrails?: A Lot Help needed moving to and from a bed to a chair (including a wheelchair)?: A Lot Help needed standing up from a chair using your arms (e.g., wheelchair or bedside chair)?: A Lot Help needed to walk in hospital room?: A Lot Help needed climbing 3-5 steps with a railing? : Total 6 Click Score: 11    End of Session     Patient left: in chair;with call bell/phone within reach;with family/visitor present Nurse Communication: Mobility status PT Visit Diagnosis: Unsteadiness on feet (R26.81);Other abnormalities of gait and mobility (R26.89);Difficulty in walking, not elsewhere classified (R26.2);Other symptoms and signs involving the nervous system (Z32.992)     Time: 4268-3419 PT Time Calculation (min) (ACUTE ONLY): 26 min  Charges:  $Therapeutic Activity: 8-22 mins                     11/05/2019  Hannah Carne., PT Acute Rehabilitation Services [phone]  (pager) [phone]  (office)   Hannah Padilla Hannah Padilla 11/05/2019, 5:48 PM

## 2019-11-05 NOTE — Progress Notes (Signed)
Seen and examined at bedside, no complaints.  Patient remains medically stable at this time. Peer-to-peer performed by me yesterday, patient denied for CIR.  Spouse requested expedited appeal which has been initiated.  TOC team is working on this.  Met with the spouse at bedside this morning, overall he is very appreciative of medical care they have received in the hospital.  Vital signs remained stable.  Charge RN updated regarding disposition planning.  Call with any further questions as needed.  Gerlean Ren MD [facility]

## 2019-11-05 NOTE — Progress Notes (Signed)
  Speech Language Pathology Treatment: Cognitive-Linquistic  Patient Details Name: Hannah Padilla MRN: [id number] DOB: August 25, 1950 Today's Date: 11/05/2019 Time: 0223-3612 SLP Time Calculation (min) (ACUTE ONLY): 20 min  Assessment / Plan / Recommendation Clinical Impression  Pt was seen for cognitive/language treatment and was attentive and cooperative throughout the session. Treatment focused on increasing intelligibility at the phrase level. She participated in Pump Back (MIT) in order to facilitate expression of intelligible short phrases and target increased volume. Given direct instruction, she demonstrated increased volume during MIT while counting, reciting days of the week, and saying short phrases. SLP also facilitated expression of intelligible speech by having the pt describe pictures. During the activity, she successfully produced short phrases with increased volume given a model. She also participated in Respiratory Muscle Training (RMT) in order to address inadequate respiration for speech. Pt's husband reports that they have been utilizing RMT device 3x/day. Pt completed 18 reps at 14 cmH20 with min verbal cues. Given pt's progress with RMT, recommend that she complete 20 reps, 3x/day. SLP will continue to f/u acutely.   HPI HPI: 69 yo female with onset of Parkinson's like symptoms was admitted to ED, with recurrent changes of metabolic encephalopathy, AMS, panic attacks, and tremors. Pt is rapidly declining in her mobility to approach dependent care.  Neurologists are working to diagnose her condition. PMHx:  Rocky mountain spotted fever, UTI, dementia, incontinence, colon polyp, mood changes, hyperthyroidism, anxiety,      SLP Plan  Continue with current plan of care       Recommendations                   Oral Care Recommendations: Oral care BID Follow up Recommendations: Inpatient Rehab SLP Visit Diagnosis: Aphasia (R47.01) Plan: Continue with current  plan of care       GO                Hannah Padilla 11/05/2019, 2:27 PM

## 2019-11-06 LAB — GLUCOSE, CAPILLARY: Glucose-Capillary: 104 mg/dL — ABNORMAL HIGH (ref 70–99)

## 2019-11-06 MED ORDER — DEXTROSE 50 % IV SOLN
INTRAVENOUS | Status: AC
  Filled 2019-11-06: qty 50

## 2019-11-06 MED ORDER — POLYVINYL ALCOHOL 1.4 % OP SOLN
1.0000 [drp] | OPHTHALMIC | Status: DC | PRN
  Administered 2019-11-09: 1 [drp] via OPHTHALMIC
  Filled 2019-11-06: qty 15

## 2019-11-06 NOTE — Progress Notes (Signed)
PROGRESS NOTE    Hannah Padilla  TMH:[id number] DOB: [birth year] DOA: 10/20/2019 PCP: Carlena Hurl, PA-C   Brief Narrative:  69 year old female with history of subclinical hyperthyroidism, RMSF, UTIs presented with progressive decline over the last 3 months. Prior to the onset of symptoms patient was healthy and able to complete all of her ADLs without any assistance. Per son, her PCP had diagnosed her with hyperthyroidism, and was placed on methimazole and propranolol. Reported that she had been having panic attacks, tremor, intermittent sweating, generalized weakness, falls, difficulty swallowing, hallucinations, insomnia and bowel and urine incontinence. Patient had extensive work-up including lumbar puncture, MRI, pheochromocytoma work-up, RMSF was positive. Completed 10 days of doxycycline. Patient is currently followed outpatient by Dr. Jannifer Franklin ( outpatient neurologist). She was placed on Valium which helped somewhat with the panic attacks,  Seroquel was attempted however patient became catatonic and this was discontinued after 2 doses. Patient was admitted for further work-up.  Neurology was suspecting CJD remains very possible cause for her rapid neurological decline.  Ordered 24-hour urine collection for heavy metals,  Porphobilinogen, delta alae, results pending.  Patient had shown some signs of improvement after IV steroids, husband reports improvement with sinemet and B12 supplemntation.  Neurology recommended would hold on IVIG at this point given improvement in her symptoms. PT recommended rehab.   Assessment & Plan:   Principal Problem:   Metabolic encephalopathy Active Problems:   Anxiety   Transaminitis   Subclinical hyperthyroidism   Dysphagia   Acute metabolic encephalopathy   Acute metabolic encephalopathy with relatively rapid and progressive decline.  Intermittent psychosis -Has undergone extensive work-up with neurology including heavy metal testing, EEG,  paraneoplastic panel. Thus far work-up has been negative. -Currently holding off on IVIG treatment. -Vitamin B12 supplementation -Sinemet 3 times daily  Subclinical hyperthyroidism -Continue Home regimen includes methimazole 5 mg 3 times daily.   Admitting physician, Dr. Fernande Boyden discussed with Dr. Delrae Rend. -TSH 0.8, free T4 1.96 (improving from 2.33), total T4 10.5, T3 73 -Dr. Buddy Duty will be able to see her outpatient,will need ambulatory referralto Dr. Kerr-endocrinology at the time of discharge -Continue methimazole and propranolol.  Anxiety, insomnia. -Continue Valium 2.5 mg every 12 hours. Seroquel 50 mg daily  Transaminitis -Stable  Dysphagia SLP evaluationcompleted on 9/16, recommended dysphagia 3 diet with thin liquids  History of Rocky Mountain spotted fever-treated with 10 days of oral doxycycline outpatient.  PT recommendations-CIR  DVT prophylaxis: Lovenox Code Status: Full code Family Communication: Husband and nursing director at bedside  Status is: Inpatient  Remains inpatient appropriate because:Unsafe d/c plan   Dispo: The patient is from: Home              Anticipated d/c is to: CIR versus SNF              Anticipated d/c date is: 1 day              Patient currently is medically stable to d/c.  Awaiting decision from expedited appeal.  There is no height or weight on file to calculate BMI.    Subjective: Apparently patient had episode of paranoia overnight.  During my evaluation patient was resting did not have any complaints.  Met with the nursing director and the husband at bedside.  Soon after me leaving husband reported patient was not being much responsive.  When I arrived back at bedside patient was laying there with her eyes closed.  Upon sternal rub she was squirming, resisting to open her  eyelids but pupils are reactive.  When lifted her arm overhead she was able to gently put on the side on her own..   I asked the  husband to step outside the room with me and I explained to him this is likely related to her mild psychosis and should be intermittent and episodic.  No further treatment is necessary for this.  Examination: Constitutional: Not in acute distress.  Pupils are reactive to light bilaterally Respiratory: Clear to auscultation bilaterally Cardiovascular: Normal sinus rhythm, no rubs Abdomen: Nontender nondistended good bowel sounds Musculoskeletal: No edema noted Skin: No rashes seen Neurologic: Difficult to assess Psychiatric: Poor judgment and insight  Objective: Vitals:   11/06/19 0332 11/06/19 0805 11/06/19 0930 11/06/19 1140  BP: 108/70 128/85 103/69 126/86  Pulse: 67 75 68 84  Resp: _0 Temp: 98.6 F (37 C) 98.6 F (37 C) 99.4 F (37.4 C) 98.6 F (37 C)  TempSrc: Axillary Oral Oral Oral  SpO2: 97% 96% 98% 98%    Intake/Output Summary (Last 24 hours) at 11/06/2019 1157 Last data filed at 11/06/2019 0334 Gross per 24 hour  Intake --  Output 600 ml  Net -600 ml   There were no vitals filed for this visit.   Data Reviewed:   CBC: Recent Labs  Lab 11/01/19 0437  WBC 8.6  HGB 13.8  HCT 41.7  MCV 95.6  PLT 675   Basic Metabolic Panel: Recent Labs  Lab 11/01/19 0437  NA 136  K 3.9  CL 107  CO2 23  GLUCOSE 93  BUN 14  CREATININE 0.69  CALCIUM 8.9  MG 2.4  PHOS 4.0   GFR: CrCl cannot be calculated (Unknown ideal weight.). Liver Function Tests: No results for input(s): AST, ALT, ALKPHOS, BILITOT, PROT, ALBUMIN in the last 168 hours. No results for input(s): LIPASE, AMYLASE in the last 168 hours. No results for input(s): AMMONIA in the last 168 hours. Coagulation Profile: No results for input(s): INR, PROTIME in the last 168 hours. Cardiac Enzymes: No results for input(s): CKTOTAL, CKMB, CKMBINDEX, TROPONINI in the last 168 hours. BNP (last 3 results) No results for input(s): PROBNP in the last 8760 hours. HbA1C: No results for input(s):  HGBA1C in the last 72 hours. CBG: Recent Labs  Lab 11/02/19 1123 11/06/19 0935  GLUCAP 89 104*   Lipid Profile: No results for input(s): CHOL, HDL, LDLCALC, TRIG, CHOLHDL, LDLDIRECT in the last 72 hours. Thyroid Function Tests: No results for input(s): TSH, T4TOTAL, FREET4, T3FREE, THYROIDAB in the last 72 hours. Anemia Panel: No results for input(s): VITAMINB12, FOLATE, FERRITIN, TIBC, IRON, RETICCTPCT in the last 72 hours. Sepsis Labs: No results for input(s): PROCALCITON, LATICACIDVEN in the last 168 hours.  No results found for this or any previous visit (from the past 240 hour(s)).       Radiology Studies: No results found.      Scheduled Meds: . carbidopa-levodopa  1 tablet Oral TID  . dextrose      . enoxaparin (LOVENOX) injection  40 mg Subcutaneous Q24H  . methimazole  5 mg Oral TID  . pantoprazole  40 mg Oral Daily  . propranolol ER  60 mg Oral Daily  . sertraline  50 mg Oral Daily  . sodium chloride flush  3 mL Intravenous Q12H   Continuous Infusions:   LOS: 16 days   Time spent= 15 mins    Fabiola Mudgett Arsenio Loader, MD [facility]  If 7PM-7AM, please contact night-coverage  11/06/2019, 11:57 AM

## 2019-11-06 NOTE — Progress Notes (Signed)
Inpatient Rehabilitation Admissions Coordinator  I await expedited appeal decision with [facility] for a possible CIR admit pending their approval and bed availability.  Danne Baxter, RN, MSN Rehab Admissions Coordinator [phone] 11/06/2019 8:50 AM

## 2019-11-06 NOTE — Care Management Important Message (Signed)
Important Message  Patient Details  Name: Hannah Padilla MRN: [id number] Date of Birth: October 03, 1950   Medicare Important Message Given:  Yes     Yadira Hada 11/06/2019, 4:03 PM

## 2019-11-07 MED ORDER — POLYETHYLENE GLYCOL 3350 17 G PO PACK
17.0000 g | PACK | Freq: Every day | ORAL | Status: DC
  Administered 2019-11-07 – 2019-11-09 (×3): 17 g via ORAL
  Filled 2019-11-07 (×3): qty 1

## 2019-11-07 MED ORDER — BISACODYL 10 MG RE SUPP
10.0000 mg | Freq: Every day | RECTAL | Status: DC | PRN
  Administered 2019-11-07: 10 mg via RECTAL
  Filled 2019-11-07 (×2): qty 1

## 2019-11-07 NOTE — Progress Notes (Signed)
PROGRESS NOTE    Hannah Padilla  BJS:[id number] DOB: 1950/05/03 DOA: 10/20/2019 PCP: Carlena Hurl, PA-C   Brief Narrative:  69 year old female with history of subclinical hyperthyroidism, RMSF, UTIs presented with progressive decline over the last 3 months. Prior to the onset of symptoms patient was healthy and able to complete all of her ADLs without any assistance.  Per son, her PCP had diagnosed her with hyperthyroidism, and was placed on methimazole and propranolol. Reported that she had been having panic attacks, tremor, intermittent sweating, generalized weakness, falls, difficulty swallowing, hallucinations, insomnia and bowel and urine incontinence. Patient had extensive work-up including lumbar puncture, MRI, pheochromocytoma work-up, RMSF was positive. Completed 10 days of doxycycline. Patient is currently followed outpatient by Dr. Jannifer Franklin ( outpatient neurologist).  She was placed on Valium which helped somewhat with the panic attacks,  Seroquel was attempted however patient became catatonic and this was discontinued after 2 doses. Patient was admitted for further work-up.  Neurology was suspecting CJD remains very possible cause for her rapid neurological decline.  Ordered 24-hour urine collection for heavy metals,  Porphobilinogen, delta alae, results pending.  Patient had shown some signs of improvement after IV steroids, husband reports improvement with sinemet and B12 supplemntation.  Neurology recommended would hold on IVIG at this point given improvement in her symptoms. PT recommended rehab.   Assessment & Plan:   Principal Problem:   Metabolic encephalopathy Active Problems:   Anxiety   Transaminitis   Subclinical hyperthyroidism   Dysphagia   Acute metabolic encephalopathy   Acute metabolic encephalopathy with relatively rapid and progressive decline.  Intermittent psychosis -Has undergone extensive work-up with neurology including heavy metal testing, EEG,  paraneoplastic panel. Thus far work-up has been negative.  It was determined she would not need any IVIG treatment. -Vitamin B12 supplementation -Sinemet 3 times daily   Subclinical hyperthyroidism  -Continue Home regimen includes methimazole 5 mg 3 times daily.    Admitting physician, Dr. Fuller Plan discussed with Dr. Delrae Rend. -TSH 0.8, free T4 1.96 (improving from 2.33), total T4 10.5, T3 73    - Dr. Buddy Duty will be able to see her outpatient, will need ambulatory referral to Dr. Buddy Duty- endocrinology at the time of discharge -Continue methimazole and propranolol.    Anxiety, insomnia. -Continue Valium 2.5 mg every 12 hours. Seroquel 50 mg daily.  This regimen is working well for her.    Transaminitis -Stable   Dysphagia SLP evaluation completed on 9/16, recommended dysphagia 3 diet with thin liquids  History of [place] spotted fever-treated with 10 days of oral doxycycline outpatient.   PT recommendations-CIR  DVT prophylaxis: Lovenox Code Status: Full code Family Communication: Husband and RN at bedside  Status is: Inpatient  Remains inpatient appropriate because:Unsafe d/c plan   Dispo: The patient is from: Home              Anticipated d/c is to: CIR versus SNF              Anticipated d/c date is: 1 day              Patient currently is medically stable to d/c.  Awaiting decision from expedited appeal.  There is no height or weight on file to calculate BMI.    Subjective: Seen and examined at bedside.  According to her husband she is feeling much better today.  She is participating passive physical therapy in the bed and she is trying to hold up her legs, tracking husband's  finger and doing nose to finger test.  She is trying to read words on the board.  He is happy with her progress in terms of willingness to participate.  Examination: Constitutional: Not in acute distress, appears chronically ill. Respiratory: Clear to auscultation  bilaterally Cardiovascular: Normal sinus rhythm, no rubs Abdomen: Nontender nondistended good bowel sounds Musculoskeletal: No edema noted Skin: No rashes seen Neurologic: Difficult to assess but nonfocal exam Psychiatric: Alert to name otherwise overall difficult to assess.  Overall poor judgment  Objective: Vitals:   11/06/19 1931 11/06/19 2356 11/07/19 0334 11/07/19 0807  BP: 135/89 (!) 128/97 101/74 96/64  Pulse: 73 72 72 79  Resp: 18 16 18 16   Temp: 98.2 F (36.8 C) 98.1 F (36.7 C) 98 F (36.7 C) 98.3 F (36.8 C)  TempSrc: Oral  Oral Oral  SpO2: 100% 99% 99% 93%    Intake/Output Summary (Last 24 hours) at 11/07/2019 1107 Last data filed at 11/07/2019 0334 Gross per 24 hour  Intake 120 ml  Output 600 ml  Net -480 ml   There were no vitals filed for this visit.   Data Reviewed:   CBC: Recent Labs  Lab 11/01/19 0437  WBC 8.6  HGB 13.8  HCT 41.7  MCV 95.6  PLT 505   Basic Metabolic Panel: Recent Labs  Lab 11/01/19 0437  NA 136  K 3.9  CL 107  CO2 23  GLUCOSE 93  BUN 14  CREATININE 0.69  CALCIUM 8.9  MG 2.4  PHOS 4.0   GFR: CrCl cannot be calculated (Unknown ideal weight.). Liver Function Tests: No results for input(s): AST, ALT, ALKPHOS, BILITOT, PROT, ALBUMIN in the last 168 hours. No results for input(s): LIPASE, AMYLASE in the last 168 hours. No results for input(s): AMMONIA in the last 168 hours. Coagulation Profile: No results for input(s): INR, PROTIME in the last 168 hours. Cardiac Enzymes: No results for input(s): CKTOTAL, CKMB, CKMBINDEX, TROPONINI in the last 168 hours. BNP (last 3 results) No results for input(s): PROBNP in the last 8760 hours. HbA1C: No results for input(s): HGBA1C in the last 72 hours. CBG: Recent Labs  Lab 11/02/19 1123 11/06/19 0935  GLUCAP 89 104*   Lipid Profile: No results for input(s): CHOL, HDL, LDLCALC, TRIG, CHOLHDL, LDLDIRECT in the last 72 hours. Thyroid Function Tests: No results for  input(s): TSH, T4TOTAL, FREET4, T3FREE, THYROIDAB in the last 72 hours. Anemia Panel: No results for input(s): VITAMINB12, FOLATE, FERRITIN, TIBC, IRON, RETICCTPCT in the last 72 hours. Sepsis Labs: No results for input(s): PROCALCITON, LATICACIDVEN in the last 168 hours.  No results found for this or any previous visit (from the past 240 hour(s)).       Radiology Studies: No results found.      Scheduled Meds:  carbidopa-levodopa  1 tablet Oral TID   enoxaparin (LOVENOX) injection  40 mg Subcutaneous Q24H   methimazole  5 mg Oral TID   pantoprazole  40 mg Oral Daily   polyethylene glycol  17 g Oral Daily   propranolol ER  60 mg Oral Daily   sertraline  50 mg Oral Daily   sodium chloride flush  3 mL Intravenous Q12H   Continuous Infusions:    LOS: 17 days   Time spent= 15 mins    Marquinn Meschke Arsenio Loader, MD [facility]  If 7PM-7AM, please contact night-coverage  11/07/2019, 11:07 AM

## 2019-11-08 MED ORDER — POLYETHYLENE GLYCOL 3350 17 G PO PACK: 17.0000 g | PACK | Freq: Every day | ORAL | 0 refills | Status: AC

## 2019-11-08 MED ORDER — BISACODYL 10 MG RE SUPP: 10.0000 mg | Freq: Every day | RECTAL | 0 refills | Status: DC | PRN

## 2019-11-08 NOTE — Progress Notes (Signed)
Seen and examined at bedside, no complaints.  Patient's husband is currently working with her to provide with some passive physical therapy in the bed.  Vital signs remained stable no complaints.  Still waiting to hear back from expedited p.o. for CIR placement otherwise will need to go to SNF.  TOC team is aware of this.  Discharge summary has been completed, I will need to be updated on the day of discharge.  Gerlean Ren MD [facility]

## 2019-11-08 NOTE — Plan of Care (Signed)
  Problem: Health Behavior/Discharge Planning: Goal: Ability to manage health-related needs will improve Outcome: Progressing   Problem: Clinical Measurements: Goal: Ability to maintain clinical measurements within normal limits will improve 11/08/2019 0254 by Marcos Eke, RN Outcome: Progressing 11/08/2019 0252 by Carin Hock I, RN Outcome: Progressing   Problem: Clinical Measurements: Goal: Diagnostic test results will improve Outcome: Progressing   Problem: Clinical Measurements: Goal: Will remain free from infection Outcome: Progressing   Problem: Clinical Measurements: Goal: Respiratory complications will improve Outcome: Progressing

## 2019-11-09 ENCOUNTER — Encounter (HOSPITAL_COMMUNITY): Payer: Self-pay | Admitting: Physical Medicine & Rehabilitation

## 2019-11-09 ENCOUNTER — Inpatient Hospital Stay (HOSPITAL_COMMUNITY)
Admission: RE | Admit: 2019-11-09 | Discharge: 2019-12-04 | DRG: 945 | Disposition: A | Discharge status: Home Health | Payer: Medicare Other | Source: Intra-hospital | Attending: Physical Medicine & Rehabilitation | Admitting: Physical Medicine & Rehabilitation

## 2019-11-09 DIAGNOSIS — R1310 Dysphagia, unspecified: Secondary | ICD-10-CM | POA: Diagnosis not present

## 2019-11-09 DIAGNOSIS — G9341 Metabolic encephalopathy: Secondary | ICD-10-CM | POA: Diagnosis present

## 2019-11-09 DIAGNOSIS — E876 Hypokalemia: Secondary | ICD-10-CM | POA: Diagnosis not present

## 2019-11-09 DIAGNOSIS — R627 Adult failure to thrive: Secondary | ICD-10-CM

## 2019-11-09 DIAGNOSIS — E039 Hypothyroidism, unspecified: Secondary | ICD-10-CM | POA: Diagnosis not present

## 2019-11-09 DIAGNOSIS — N390 Urinary tract infection, site not specified: Secondary | ICD-10-CM | POA: Diagnosis not present

## 2019-11-09 DIAGNOSIS — R159 Full incontinence of feces: Secondary | ICD-10-CM | POA: Diagnosis present

## 2019-11-09 DIAGNOSIS — R531 Weakness: Secondary | ICD-10-CM | POA: Diagnosis not present

## 2019-11-09 DIAGNOSIS — F0390 Unspecified dementia without behavioral disturbance: Secondary | ICD-10-CM

## 2019-11-09 DIAGNOSIS — K219 Gastro-esophageal reflux disease without esophagitis: Secondary | ICD-10-CM | POA: Diagnosis not present

## 2019-11-09 DIAGNOSIS — F0391 Unspecified dementia with behavioral disturbance: Secondary | ICD-10-CM | POA: Diagnosis present

## 2019-11-09 DIAGNOSIS — E43 Unspecified severe protein-calorie malnutrition: Secondary | ICD-10-CM | POA: Diagnosis present

## 2019-11-09 DIAGNOSIS — Z515 Encounter for palliative care: Secondary | ICD-10-CM | POA: Diagnosis not present

## 2019-11-09 DIAGNOSIS — Z681 Body mass index (BMI) 19 or less, adult: Secondary | ICD-10-CM

## 2019-11-09 DIAGNOSIS — Z23 Encounter for immunization: Secondary | ICD-10-CM | POA: Diagnosis not present

## 2019-11-09 DIAGNOSIS — E8809 Other disorders of plasma-protein metabolism, not elsewhere classified: Secondary | ICD-10-CM

## 2019-11-09 DIAGNOSIS — F064 Anxiety disorder due to known physiological condition: Secondary | ICD-10-CM

## 2019-11-09 DIAGNOSIS — B9620 Unspecified Escherichia coli [E. coli] as the cause of diseases classified elsewhere: Secondary | ICD-10-CM | POA: Diagnosis not present

## 2019-11-09 DIAGNOSIS — F410 Panic disorder [episodic paroxysmal anxiety] without agoraphobia: Secondary | ICD-10-CM | POA: Diagnosis present

## 2019-11-09 DIAGNOSIS — R7401 Elevation of levels of liver transaminase levels: Secondary | ICD-10-CM | POA: Diagnosis not present

## 2019-11-09 DIAGNOSIS — R32 Unspecified urinary incontinence: Secondary | ICD-10-CM | POA: Diagnosis present

## 2019-11-09 DIAGNOSIS — E0590 Thyrotoxicosis, unspecified without thyrotoxic crisis or storm: Secondary | ICD-10-CM | POA: Diagnosis not present

## 2019-11-09 DIAGNOSIS — F411 Generalized anxiety disorder: Secondary | ICD-10-CM

## 2019-11-09 DIAGNOSIS — Z90711 Acquired absence of uterus with remaining cervical stump: Secondary | ICD-10-CM | POA: Diagnosis not present

## 2019-11-09 DIAGNOSIS — Z79899 Other long term (current) drug therapy: Secondary | ICD-10-CM | POA: Diagnosis not present

## 2019-11-09 DIAGNOSIS — Z7189 Other specified counseling: Secondary | ICD-10-CM | POA: Diagnosis not present

## 2019-11-09 DIAGNOSIS — F419 Anxiety disorder, unspecified: Secondary | ICD-10-CM | POA: Diagnosis not present

## 2019-11-09 DIAGNOSIS — E46 Unspecified protein-calorie malnutrition: Secondary | ICD-10-CM | POA: Diagnosis not present

## 2019-11-09 DIAGNOSIS — R443 Hallucinations, unspecified: Secondary | ICD-10-CM | POA: Diagnosis not present

## 2019-11-09 DIAGNOSIS — F447 Conversion disorder with mixed symptom presentation: Secondary | ICD-10-CM | POA: Diagnosis not present

## 2019-11-09 HISTORY — DX: Anxiety disorder, unspecified: F41.9

## 2019-11-09 LAB — CBC
HCT: 40 % (ref 36.0–46.0)
Hemoglobin: 13.3 g/dL (ref 12.0–15.0)
MCH: 31.9 pg (ref 26.0–34.0)
MCHC: 33.3 g/dL (ref 30.0–36.0)
MCV: 95.9 fL (ref 80.0–100.0)
Platelets: 199 10*3/uL (ref 150–400)
RBC: 4.17 MIL/uL (ref 3.87–5.11)
RDW: 14.8 % (ref 11.5–15.5)
WBC: 6.2 10*3/uL (ref 4.0–10.5)
nRBC: 0 % (ref 0.0–0.2)

## 2019-11-09 LAB — CREATININE, SERUM
Creatinine, Ser: 0.7 mg/dL (ref 0.44–1.00)
GFR calc Af Amer: >60 mL/min (ref >60)
GFR calc non Af Amer: >60 mL/min (ref >60)

## 2019-11-09 MED ORDER — POLYETHYLENE GLYCOL 3350 17 G PO PACK
17.0000 g | PACK | Freq: Every day | ORAL | Status: DC
  Administered 2019-11-10 – 2019-12-04 (×23): 17 g via ORAL
  Filled 2019-11-09 (×25): qty 1

## 2019-11-09 MED ORDER — ALBUTEROL SULFATE (2.5 MG/3ML) 0.083% IN NEBU
2.5000 mg | INHALATION_SOLUTION | Freq: Four times a day (QID) | RESPIRATORY_TRACT | Status: DC | PRN
  Filled 2019-11-09: qty 3

## 2019-11-09 MED ORDER — DIAZEPAM 5 MG PO TABS
2.5000 mg | ORAL_TABLET | Freq: Two times a day (BID) | ORAL | Status: DC | PRN
  Administered 2019-11-10 (×2): 2.5 mg via ORAL
  Filled 2019-11-09 (×2): qty 1

## 2019-11-09 MED ORDER — ONDANSETRON HCL 4 MG/2ML IJ SOLN: 4.0000 mg | Freq: Four times a day (QID) | INTRAMUSCULAR | Status: DC | PRN

## 2019-11-09 MED ORDER — POLYVINYL ALCOHOL 1.4 % OP SOLN
1.0000 [drp] | OPHTHALMIC | Status: DC | PRN
  Administered 2019-11-10 – 2019-11-13 (×4): 1 [drp] via OPHTHALMIC
  Filled 2019-11-09: qty 15

## 2019-11-09 MED ORDER — ONDANSETRON HCL 4 MG PO TABS
4.0000 mg | ORAL_TABLET | Freq: Four times a day (QID) | ORAL | Status: DC | PRN
  Administered 2019-11-19 – 2019-12-02 (×3): 4 mg via ORAL
  Filled 2019-11-09 (×3): qty 1

## 2019-11-09 MED ORDER — PANTOPRAZOLE SODIUM 40 MG PO TBEC
40.0000 mg | DELAYED_RELEASE_TABLET | Freq: Every day | ORAL | Status: DC
  Administered 2019-11-10 – 2019-12-04 (×25): 40 mg via ORAL
  Filled 2019-11-09 (×25): qty 1

## 2019-11-09 MED ORDER — BISACODYL 10 MG RE SUPP
10.0000 mg | Freq: Every day | RECTAL | Status: DC | PRN
  Administered 2019-11-23: 10 mg via RECTAL
  Filled 2019-11-09: qty 1

## 2019-11-09 MED ORDER — BISACODYL 5 MG PO TBEC
10.0000 mg | DELAYED_RELEASE_TABLET | Freq: Every day | ORAL | Status: DC | PRN
  Administered 2019-11-22: 10 mg via ORAL
  Filled 2019-11-09: qty 2

## 2019-11-09 MED ORDER — PROPRANOLOL HCL ER 60 MG PO CP24
60.0000 mg | ORAL_CAPSULE | Freq: Every day | ORAL | Status: DC
  Administered 2019-11-10 – 2019-12-03 (×24): 60 mg via ORAL
  Filled 2019-11-09 (×25): qty 1

## 2019-11-09 MED ORDER — ENOXAPARIN SODIUM 40 MG/0.4ML ~~LOC~~ SOLN: 40.0000 mg | SUBCUTANEOUS | Status: DC

## 2019-11-09 MED ORDER — INFLUENZA VAC A&B SA ADJ QUAD 0.5 ML IM PRSY
0.5000 mL | PREFILLED_SYRINGE | INTRAMUSCULAR | Status: AC
  Administered 2019-11-10: 0.5 mL via INTRAMUSCULAR
  Filled 2019-11-09: qty 0.5

## 2019-11-09 MED ORDER — METHIMAZOLE 5 MG PO TABS
5.0000 mg | ORAL_TABLET | Freq: Three times a day (TID) | ORAL | Status: DC
  Administered 2019-11-09 – 2019-11-13 (×10): 5 mg via ORAL
  Filled 2019-11-09 (×13): qty 1

## 2019-11-09 MED ORDER — SERTRALINE HCL 50 MG PO TABS
50.0000 mg | ORAL_TABLET | Freq: Every day | ORAL | Status: DC
  Administered 2019-11-10 – 2019-12-01 (×22): 50 mg via ORAL
  Filled 2019-11-09 (×22): qty 1

## 2019-11-09 MED ORDER — ACETAMINOPHEN 325 MG PO TABS
650.0000 mg | ORAL_TABLET | Freq: Four times a day (QID) | ORAL | Status: DC | PRN
  Administered 2019-11-14 – 2019-12-02 (×4): 650 mg via ORAL
  Filled 2019-11-09 (×4): qty 2

## 2019-11-09 MED ORDER — ACETAMINOPHEN 650 MG RE SUPP: 650.0000 mg | Freq: Four times a day (QID) | RECTAL | Status: DC | PRN

## 2019-11-09 MED ORDER — PNEUMOCOCCAL VAC POLYVALENT 25 MCG/0.5ML IJ INJ
0.5000 mL | INJECTION | INTRAMUSCULAR | Status: AC
  Administered 2019-11-10: 0.5 mL via INTRAMUSCULAR
  Filled 2019-11-09 (×2): qty 0.5

## 2019-11-09 MED ORDER — CARBIDOPA-LEVODOPA 25-100 MG PO TABS
1.0000 | ORAL_TABLET | Freq: Three times a day (TID) | ORAL | Status: DC
  Administered 2019-11-09 – 2019-12-04 (×73): 1 via ORAL
  Filled 2019-11-09 (×76): qty 1

## 2019-11-09 MED ORDER — ENOXAPARIN SODIUM 40 MG/0.4ML ~~LOC~~ SOLN
40.0000 mg | SUBCUTANEOUS | Status: DC
  Administered 2019-11-09 – 2019-12-03 (×25): 40 mg via SUBCUTANEOUS
  Filled 2019-11-09 (×24): qty 0.4

## 2019-11-09 NOTE — Progress Notes (Signed)
Cristina Gong, RN  Rehab Admission Coordinator  Physical Medicine and Rehabilitation  PMR Pre-admission     Signed  Date of Service:  11/04/2019  2:36 PM      Related encounter: ED to Hosp-Admission (Current) from 10/20/2019 in [facility]      Signed       Show:Clear all _0 Manual_1 Template_2 Copied  Added by: _3 Cristina Gong, RN  _4 Hover for details PMR Admission Coordinator Pre-Admission Assessment   Patient: Hannah Padilla is an 69 y.o., female MRN: [id number] DOB: 13-Oct-1950 Height:   Weight:                                                                                                                                                    Insurance Information HMO: yes  PPO:      PCP:      IPA:      80/20:      OTHER:  PRIMARY: [employer] Medicare      Policy#: [id number]      Subscriber: pt CM Name: Vito Backers at expedited appeal      Phone#: [phone] option 8     Fax#: [phone] Pre-Cert#: [id number] initial denial . Approved via expedited appeal approved for 7 days update due 10/8     Employer: n/a Benefits:  Phone #: [phone]     Name:  Eff. Date: 02/06/2019     Deduct: none      Out of Pocket Max: $4500      Life Max: none  CIR: $325 co pay per day days 1 until 5      SNF: no copay days 1 until 20; $184 co py per day days 21 until 45; no copay days 46 until 100 Outpatient: $35 per visit     Co-Pay: visits per medical neccesity Home Health: 100%      Co-Pay: visits per medical l neccesity DME: 80%     Co-Pay: 20% Providers: in network  SECONDARY: none      Policy#:       Phone#:    Development worker, community:       Phone#:    The Engineer, petroleum" for patients in Inpatient Rehabilitation Facilities with attached "Privacy Act [place] Records" was provided and verbally reviewed with: Family   Emergency Contact Information         Contact Information     Name Relation Home Work Mobile     [place] Spouse [phone]   [phone]       Current Medical History  Patient Admitting Diagnosis: altered Mental status, encephalopathy   History of Present Illness:  69 year old right-handed female with history of hyperthyroidism maintained on Tapazole, recurrent UTIs, [place] spotted fever.  Presented 10/20/2019 with progressive decline with noted apraxia over the last 3 months as well as  intermittent panic attacks generalized weakness and falls.  Prior to onset of symptoms patient was healthy able to complete all of her ADLs without assistance.  Most recent MRI showed mild generalized cortical atrophy stable compared to scan of 08/11/2019.  Scattered T2 FLAIR hyperintense foci predominantly in the subcortical and deep white matter of the hemispheres most consistent with chronic microvascular ischemic change.  No acute findings.  EEG consistent with mild to moderate diffuse encephalopathy.  No seizure.  CT of the chest abdomen pelvis showed no evidence of primary metastatic disease.  Admission chemistries potassium 3.4 total protein 6.4 AST 49 ALT 50 hemoglobin 14.7 SARS coronavirus negative, TSH 0.851, free T4 1.96, T3 total 73, T4 total 10.5 ammonia level 17, ESR of 2, paraneoplastic CSF study negative.  Neurology follow-up suspect progressive dementia of unknown etiology.  She did receive 5 days of IV steroids during her hospital course as well as initiation of Sinemet for trial.  She is on a mechanical soft diet thin liquids.  Subcutaneous Lovenox for DVT prophylaxis.  Thyroid function studies were reviewed by endocrinology Dr. Buddy Duty who recommends follow-up outpatient with ambulatory referral with recommendations to continue Tapazole as well as propranolol.   Past Medical History      Past Medical History:  Diagnosis Date  . Colon polyp 2015  . Elevated serum free T4 level      husband report  . Farsightedness      wears glasses  . Mood change 2021    major change in affect, health  issues with multiple ED visits, hospitalization, starting 07/10/2019  . [facility] spotted fever) 2021  . Urinary tract infection        Family History  family history includes Alzheimer's disease in her mother; COPD in her father; Other in her father; Protein S deficiency in an other family member.   Prior Rehab/Hospitalizations:  Has the patient had prior rehab or hospitalizations prior to admission? Yes   Has the patient had major surgery during 100 days prior to admission? No   Current Medications    Current Facility-Administered Medications:  .  acetaminophen (TYLENOL) tablet 650 mg, 650 mg, Oral, Q6H PRN **OR** acetaminophen (TYLENOL) suppository 650 mg, 650 mg, Rectal, Q6H PRN, Smith, Rondell A, MD .  albuterol (PROVENTIL) (2.5 MG/3ML) 0.083% nebulizer solution 2.5 mg, 2.5 mg, Nebulization, Q6H PRN, Smith, Rondell A, MD .  bisacodyl (DULCOLAX) EC tablet 10 mg, 10 mg, Oral, Daily PRN, Tamala Julian, Rondell A, MD .  bisacodyl (DULCOLAX) suppository 10 mg, 10 mg, Rectal, Daily PRN, Amin, Ankit Chirag, MD, 10 mg at 11/07/19 1811 .  carbidopa-levodopa (SINEMET IR) 25-100 MG per tablet immediate release 1 tablet, 1 tablet, Oral, TID, Greta Doom, MD, 1 tablet at 11/09/19 0817 .  diazepam (VALIUM) tablet 2.5 mg, 2.5 mg, Oral, Q12H PRN, Rai, Ripudeep K, MD, 2.5 mg at 11/08/19 1941 .  enoxaparin (LOVENOX) injection 40 mg, 40 mg, Subcutaneous, Q24H, Smith, Rondell A, MD, 40 mg at 11/08/19 1942 .  methimazole (TAPAZOLE) tablet 5 mg, 5 mg, Oral, TID, Tamala Julian, Rondell A, MD, 5 mg at 11/08/19 2112 .  ondansetron (ZOFRAN) tablet 4 mg, 4 mg, Oral, Q6H PRN **OR** ondansetron (ZOFRAN) injection 4 mg, 4 mg, Intravenous, Q6H PRN, Tamala Julian, Rondell A, MD, 4 mg at 11/02/19 0551 .  pantoprazole (PROTONIX) EC tablet 40 mg, 40 mg, Oral, Daily, Smith, Rondell A, MD, 40 mg at 11/08/19 1046 .  polyethylene glycol (MIRALAX / GLYCOLAX) packet 17 g, 17 g, Oral, Daily,  Damita Lack, MD, 17 g at  11/08/19 1046 .  polyvinyl alcohol (LIQUIFILM TEARS) 1.4 % ophthalmic solution 1 drop, 1 drop, Both Eyes, PRN, Amin, Ankit Chirag, MD .  propranolol ER (INDERAL LA) 24 hr capsule 60 mg, 60 mg, Oral, Daily, Smith, Rondell A, MD, 60 mg at 11/08/19 1046 .  sertraline (ZOLOFT) tablet 50 mg, 50 mg, Oral, Daily, Smith, Rondell A, MD, 50 mg at 11/08/19 1046 .  sodium chloride flush (NS) 0.9 % injection 3 mL, 3 mL, Intravenous, Q12H, Smith, Rondell A, MD, 3 mL at 11/08/19 2112   Patients Current Diet:     Diet Order                      DIET DYS 3 Room service appropriate? Yes; Fluid consistency: Thin  Diet effective now                      Precautions / Restrictions Precautions Precautions: Fall Precaution Comments: tone and tremors Restrictions Weight Bearing Restrictions: No Other Position/Activity Restrictions: no buckling or hyperextension noted today with standing    Has the patient had 2 or more falls or a fall with injury in the past year?No   Prior Activity Level Limited Community (1-2x/wk): decline in function over past 3 months   Prior Functional Level Prior Function Level of Independence: Needs assistance Gait / Transfers Assistance Needed: recently has needed help to walk and transfer, gait for very short trips of a few steps ADL's / Homemaking Assistance Needed: husband cares for home, assists wife to dress and bathe Communication / Swallowing Assistance Needed: soft vocal quality; mumbling at times and sometimes coherant Comments: per husband has times wiht blank stairing   Self Care: Did the patient need help bathing, dressing, using the toilet or eating?  Needed some help   Indoor Mobility: Did the patient need assistance with walking from room to room (with or without device)? Needed some help   Stairs: Did the patient need assistance with internal or external stairs (with or without device)? Needed some help   Functional Cognition: Did the patient need help  planning regular tasks such as shopping or remembering to take medications? Needed some help   Home Assistive Devices / Equipment Home Equipment: Bedside commode   Prior Device Use: Indicate devices/aids used by the patient prior to current illness, exacerbation or injury? Walker   Current Functional Level Cognition   Arousal/Alertness: Lethargic Overall Cognitive Status: Impaired/Different from baseline Difficult to assess due to: Impaired communication Current Attention Level: Sustained Orientation Level: Oriented to person, Oriented to place Following Commands: Follows one step commands consistently Safety/Judgement: Decreased awareness of safety General Comments: pt remains anxious but is motivated, is able to follow one step commands with increased accuracy (less reliance on multi-modal cues but still needed), but continues to demonstrate deficits for multi-step commands Attention: Focused, Sustained, Selective Focused Attention: Impaired Focused Attention Impairment: Verbal basic Sustained Attention: Impaired Sustained Attention Impairment: Verbal basic Memory: Impaired Memory Impairment: Storage deficit, Decreased recall of new information Awareness: Impaired Awareness Impairment: Emergent impairment Problem Solving: Impaired Problem Solving Impairment: Verbal complex Executive Function: Reasoning, Sequencing, Decision Making Reasoning: Impaired Reasoning Impairment: Verbal basic, Verbal complex Sequencing: Impaired Sequencing Impairment: Verbal basic, Verbal complex Decision Making: Impaired Decision Making Impairment: Verbal basic, Verbal complex    Extremity Assessment (includes Sensation/Coordination)   Upper Extremity Assessment: Generalized weakness, RUE deficits/detail, LUE deficits/detail RUE Deficits / Details: WFL shoulder flexion to 90  degrees full extension of all digits and able to open and close on command- no concerns for contracture. UE appear WFL  RUE  Coordination: decreased fine motor, decreased gross motor LUE Deficits / Details: shoulder flexion 90 degrees against gravity so 3 out 5 MMT, pt reaching for hand on command. pt open and closing hand on command . pt with full extension of all digits. pt does have nail mark noted in palm at this time and advised spouse to help keep pt hand in extensino when possible to allow the skin to heal. pt holding hand in fist at rest at times but is able to fully range. pt has palm guard in room and could possible benefit from sleeping guard to protect palm to help area heal. Pt with [facility] of elbow wrist hand and shoulder at this time.  LUE Coordination: decreased fine motor  Lower Extremity Assessment: Defer to PT evaluation (holding B feet in plantarflexion)     ADLs   Overall ADL's : Needs assistance/impaired Grooming: Minimal assistance, Wash/dry hands, Sitting Grooming Details (indicate cue type and reason): sitting EOB to wipe hands with min assist for initation of task  Lower Body Bathing: Maximal assistance, Sitting/lateral leans Lower Body Bathing Details (indicate cue type and reason): applying lotion to BLEs with max assist for hand over hand support, forward lean and task attention  Functional mobility during ADLs: Maximal assistance, +2 for physical assistance, Caregiver able to provide necessary level of assistance General ADL Comments: Pt remains anxious with all movement, with max encouragement is able to complete     Mobility   Overal bed mobility: Needs Assistance Bed Mobility: Supine to Sit, Rolling Rolling: Mod assist Sidelying to sit: Mod assist, +2 for physical assistance Supine to sit: Mod assist, +2 for physical assistance Sit to supine: Max assist, +2 for physical assistance Sit to sidelying: Mod assist, +2 for physical assistance, +2 for safety/equipment General bed mobility comments: assisted LE's and brought trunk up and forward with use of the pad to pivot.     Transfers    Overall transfer level: Needs assistance Equipment used: 2 person hand held assist Transfers: Sit to/from Stand, Stand Pivot Transfers Sit to Stand: Mod assist, +2 safety/equipment Stand pivot transfers: Mod assist, +2 physical assistance General transfer comment: cues for hand placement, utilized back of chair with therapists on either side to come to standing, working on standing balance and advancing legs forward and back, weightshifting, and marching in place. Due to rigidity and anxiety, requiring mod A x2 to complete activities in standing.     Ambulation / Gait / Stairs / Wheelchair Mobility   Ambulation/Gait Ambulation/Gait assistance: Max assist, Mod assist, +2 physical assistance Gait Distance (Feet): 3 Feet (forward and back.) Assistive device: 1 person hand held assist, 2 person hand held assist Gait Pattern/deviations: Step-to pattern General Gait Details: pt needed w/shift, stability and each LE advancement assist.  Pt generally unable to unweight or even slide her feet today. Gait velocity: reduced Gait velocity interpretation: <1.8 ft/sec, indicate of risk for recurrent falls     Posture / Balance Dynamic Sitting Balance Sitting balance - Comments: Requires BUE support, but with mutli-modal cues is able to maintain upright posture with no external assist (gaurding for safety) Balance Overall balance assessment: Needs assistance, History of Falls Sitting-balance support: Feet supported Sitting balance-Leahy Scale: Fair Sitting balance - Comments: Requires BUE support, but with mutli-modal cues is able to maintain upright posture with no external assist (gaurding for safety) Postural  control: Right lateral lean Standing balance support: Bilateral upper extremity supported, During functional activity Standing balance-Leahy Scale: Poor Standing balance comment: stood for 10-12 min working on standing balance, w/shifting, pregait stepping, reliant on external support       Special needs/care consideration visitor is spouse, [name]. He at times has been staying with patient 24/7 in hospital due to her high anxiety    Previous Home Environment  Living Arrangements: Spouse/significant other  Lives With: Spouse Available Help at Discharge: Family, Available 24 hours/day Type of Home: House Home Layout: One level Home Access: Stairs to enter [employer] Shower/Tub: Chiropodist: Standard Bathroom Accessibility: Yes How Accessible: Accessible via walker   Discharge Living Setting Plans for Discharge Living Setting: Patient's home, Lives with (comment) (spouse) Type of Home at Discharge: House Discharge Home Layout: One level Discharge Home Access: Stairs to enter Discharge Bathroom Shower/Tub: Tub/shower unit Discharge Bathroom Toilet: Standard Discharge Bathroom Accessibility: Yes How Accessible: Accessible via walker Does the patient have any problems obtaining your medications?: No   Social/Family/Support Systems Patient Roles: Spouse Contact Information: spouse, [name] Anticipated Caregiver: spouse Anticipated Caregiver's Contact Information: see above Ability/Limitations of Caregiver: no limitations Caregiver Availability: 24/7 Discharge Plan Discussed with Primary Caregiver: Yes Is Caregiver In Agreement with Plan?: Yes Does Caregiver/Family have Issues with Lodging/Transportation while Pt is in Rehab?: No   Goals Patient/Family Goal for Rehab: min assist with PT, OT, and SLP Expected length of stay: ELOS 2 weeks Pt/Family Agrees to Admission and willing to participate: Yes Program Orientation Provided & Reviewed with Pt/Caregiver Including Roles  & Responsibilities: Yes   Decrease burden of Care through IP rehab admission: n/a   Possible need for SNF placement upon discharge:not anticipated   Patient Condition: This patient's medical and functional status has changed since the consult dated: 10/30/2019 in which the  Rehabilitation Physician determined and documented that the patient's condition is appropriate for intensive rehabilitative care in an inpatient rehabilitation facility. See "History of Present Illness" (above) for medical update. Functional changes are: mod assist overall. Patient's medical and functional status update has been discussed with the Rehabilitation physician and patient remains appropriate for inpatient rehabilitation. Will admit to inpatient rehab today.   Preadmission Screen Completed By:  Cleatrice Burke, RN, 11/09/2019 10:20 AM2 ______________________________________________________________________   Discussed status with Dr. Naaman Plummer on 10/4/2021at 1021 and received approval for admission today.   Admission Coordinator:  Cleatrice Burke, time [address] 11/09/2019             Cosigned by: Meredith Staggers, MD at 11/09/2019 10:49 AM  Revision History                                    Routing History           Note Details  Author Cristina Gong, RN File Time 11/09/2019 10:21 AM  Author Type Rehab Admission Coordinator Status Signed  Last Editor Cristina Gong, RN Service Physical Medicine and Rehabilitation

## 2019-11-09 NOTE — Discharge Summary (Signed)
Physician Discharge Summary  Hannah Padilla OIZ:[id number] DOB: 1950/12/05 DOA: 10/20/2019  PCP: Carlena Hurl, PA-C  Admit date: 10/20/2019 Discharge date: 11/09/2019  Admitted From: Home Disposition: CIR vs SNF depending on approval  Recommendations for Outpatient Follow-up:  1. Follow up with PCP in 1-2 weeks 2. Please obtain BMP/CBC in one week your next doctors visit.    Discharge Condition: Stable CODE STATUS: Full code Diet recommendation: Regular dysphagia 3 diet  Brief/Interim Summary: 69 year old female with history of subclinical hyperthyroidism, RMSF, UTIs presented with progressive decline over the last 3 months. Prior to the onset of symptoms patient was healthy and able to complete all of her ADLs without any assistance. Per son, her PCP had diagnosed her with hyperthyroidism, and was placed on methimazole and propranolol. Reported that she had been having panic attacks, tremor, intermittent sweating, generalized weakness, falls, difficulty swallowing, hallucinations, insomnia and bowel and urine incontinence. Patient had extensive work-up including lumbar puncture, MRI, pheochromocytoma work-up, RMSF was positive. Completed 10 days of doxycycline. Patient is currently followed outpatient by Dr. Jannifer Franklin( outpatient neurologist). She was placed on Valium which helped somewhat with the panic attacks, Seroquel was attempted however patient became catatonic and this was discontinued after 2 doses. Patient was admitted for further work-up. Neurology was suspecting CJD remains very possible cause for her rapid neurological decline. Ordered 24-hour urine collection for heavy metals, Porphobilinogen, delta alae, results pending. Patient had shownsome signs of improvement after IV steroids, husband reports improvement with sinemet and B12 supplemntation. Neurology recommended would hold on IVIG at this point given improvement in her symptoms. PT recommending rehab.  Medically stable  for DC to CIR.   Assessment & Plan:   Principal Problem:   Metabolic encephalopathy Active Problems:   Anxiety   Transaminitis   Subclinical hyperthyroidism   Dysphagia   Acute metabolic encephalopathy   Acute metabolic encephalopathy with relatively rapid and progressive decline.    Improving -Has undergone extensive work-up with neurology including heavy metal testing, EEG, paraneoplastic panel. Thus far work-up has been negative. -Has shown slow improvement with 5 days of IV steroids. -Currently holding off on IVIG treatment. -Vitamin B12 supplementation -Sinemet 3 times daily -Follow up with neurology as an outpatient.  Subclinical hyperthyroidism -Continue Home regimen includes methimazole 5 mg 3 times daily.  Admitting physician, Dr. Fernande Boyden discussed with Dr. Delrae Rend. -TSH 0.8, free T4 1.96 (improving from 2.33), total T4 10.5, T3 73 -Dr. Buddy Duty will be able to see her outpatient,will need ambulatory referralto Dr. Kerr-endocrinology at the time of discharge -Continue methimazole and propranolol.  Anxiety, insomnia. -Currently on Valium 2.5 mg every 12 hours. Seroquel 50 mg daily  Transaminitis -LFTs improving.  Dysphagia SLP evaluationcompleted on 9/16, recommended dysphagia 3 diet with thin liquids  History of [place] spotted fever-treated with 10 days of oral doxycycline outpatient.  PT recommendations-CIR.  Patient is showing quite a bit of improvement in her physical ability with therapy.  In the meantime would benefit from medical supervision in terms of her anxiety and encephalopathy.           Discharge Diagnoses:  Principal Problem:   Metabolic encephalopathy Active Problems:   Anxiety   Transaminitis   Subclinical hyperthyroidism   Dysphagia   Acute metabolic encephalopathy      Consultations:  Neurology  Subjective: Patient seen and examined at the bedside this morning.  Hemodynamically  stable for discharge to CIR today.  Husband was present at the bedside.  Discharge Exam: Vitals:  11/09/19 0416 11/09/19 0808  BP: 114/67 124/77  Pulse: 64 73  Resp: 16 20  Temp: 98 F (36.7 C) 98 F (36.7 C)  SpO2: 99% 97%   Vitals:   11/08/19 2007 11/08/19 2348 11/09/19 0416 11/09/19 0808  BP: 111/72 102/65 114/67 124/77  Pulse: 81 69 64 73  Resp: 16 17 16 20   Temp: 98.7 F (37.1 C) 98.1 F (36.7 C) 98 F (36.7 C) 98 F (36.7 C)  TempSrc: Oral Oral Oral Axillary  SpO2: 98% 99% 99% 97%    General: Pt is alert, awake, not in acute distress.  Anxious appearing. Cardiovascular: RRR, S1/S2 +, no rubs, no gallops Respiratory: CTA bilaterally, no wheezing, no rhonchi Abdominal: Soft, NT, ND, bowel sounds + Extremities: no edema, no cyanosis  Discharge Instructions  Discharge Instructions    Ambulatory referral to Endocrinology   Complete by: As directed    Ambulatory referral to Neurology   Complete by: As directed    An appointment is requested in approximately: 4 weeks   Diet general   Complete by: As directed    Dysphagia 3 diet   Discharge instructions   Complete by: As directed    1)Please take prescribed medications as instructed 2)Follow up with Endocrinology and neurology as an outpatient.   Increase activity slowly   Complete by: As directed      Allergies as of 11/09/2019   No Known Allergies     Medication List    STOP taking these medications   megestrol 40 MG/ML suspension Commonly known as: MEGACE     TAKE these medications   acetaminophen 325 MG tablet Commonly known as: TYLENOL Take 2 tablets (650 mg total) by mouth every 6 (six) hours as needed for mild pain (or Fever >/= 101).   bisacodyl 5 MG EC tablet Commonly known as: DULCOLAX Take 10 mg by mouth daily as needed for moderate constipation. What changed: Another medication with the same name was added. Make sure you understand how and when to take each.   bisacodyl 10 MG  suppository Commonly known as: DULCOLAX Place 1 suppository (10 mg total) rectally daily as needed for moderate constipation. What changed: You were already taking a medication with the same name, and this prescription was added. Make sure you understand how and when to take each.   carbidopa-levodopa 25-100 MG tablet Commonly known as: SINEMET IR Take 1 tablet by mouth 3 (three) times daily.   diazepam 5 MG tablet Commonly known as: Valium Take 0.5 tablets (2.5 mg total) by mouth every 12 (twelve) hours as needed for anxiety (sleep). What changed:   how much to take  reasons to take this   methimazole 5 MG tablet Commonly known as: TAPAZOLE Take 1 tablet (5 mg total) by mouth 3 (three) times daily.   omeprazole 40 MG capsule Commonly known as: PRILOSEC Take 1 capsule by mouth once daily   ondansetron 8 MG disintegrating tablet Commonly known as: ZOFRAN-ODT Take 1 tablet (8 mg total) by mouth every 8 (eight) hours as needed. What changed: reasons to take this   polyethylene glycol 17 g packet Commonly known as: MIRALAX / GLYCOLAX Take 17 g by mouth daily.   propranolol ER 60 MG 24 hr capsule Commonly known as: Inderal LA Take 1 capsule (60 mg total) by mouth daily.   sertraline 50 MG tablet Commonly known as: ZOLOFT Take 50 mg by mouth daily.   Vitamin D (Ergocalciferol) 1.25 MG (50000 UNIT) Caps capsule Commonly known  as: DRISDOL Take 1 capsule (50,000 Units total) by mouth every 7 (seven) days. What changed: when to take this       Follow-up Information    Tysinger, Camelia Eng, PA-C. Schedule an appointment as soon as possible for a visit in 1 week(s).   Specialty: Family Medicine Contact information: [place] [address] [phone]        Delrae Rend, MD. Schedule an appointment as soon as possible for a visit in 2 week(s).   Specialty: Endocrinology Contact information: [address] [place]  [address] [phone]        [facility]. Schedule an appointment as soon as possible for a visit in 4 week(s).   Specialty: Neurology Contact information: [address] [place] [phone]             No Known Allergies  You were cared for by a hospitalist during your hospital stay. If you have any questions about your discharge medications or the care you received while you were in the hospital after you are discharged, you can call the unit and asked to speak with the hospitalist on call if the hospitalist that took care of you is not available. Once you are discharged, your primary care physician will handle any further medical issues. Please note that no refills for any discharge medications will be authorized once you are discharged, as it is imperative that you return to your primary care physician (or establish a relationship with a primary care physician if you do not have one) for your aftercare needs so that they can reassess your need for medications and monitor your lab values.   Procedures/Studies: MR BRAIN W WO CONTRAST  Result Date: 10/11/2019  [facility] [address], [place], [place] [address] [phone] NEUROIMAGING REPORT STUDY DATE: 10/10/2019 PATIENT NAME: Hannah Padilla DOB: [birth year] MRN: [id number] EXAM: MRI Brain with and without contrast ORDERING CLINICIAN: Kathrynn Ducking, MD CLINICAL HISTORY: 69 year old woman with encephalopathy COMPARISON FILMS: MRI 08/11/2019 TECHNIQUE:MRI of the brain with and without contrast was obtained utilizing 5 mm axial slices with T1, T2, T2 flair, SWI and diffusion weighted views.  T1 sagittal, T2 coronal and postcontrast views in the axial and coronal plane were obtained. CONTRAST: 12 ml [employer] IMAGING SITE: [facility], [place]. FINDINGS: On sagittal images, the spinal cord is imaged caudally to C3 and is normal in caliber.   The  contents of the posterior fossa are of normal size and position.   The pituitary gland and optic chiasm appear normal.    There is mild generalized cortical atrophy.  Ventricles are not distorted.  There are no abnormal extra-axial collections of fluid.  In the hemispheres, there are some scattered T2/FLAIR hyperintense foci predominantly in the subcortical and deep white matter.  None of the foci appear to be acute.  They do not enhance there do not appear to be new lesions compared to the 08/11/2019 MRI.  The cerebellum and brainstem appears normal.   The deep gray matter appears normal.  Diffusion weighted images are normal.  Susceptibility weighted images are normal.   The orbits appear normal.   The VIIth/VIIIth nerve complex appears normal.  The mastoid air cells appear normal.  The paranasal sinuses appear normal.  Flow voids are identified within the major intracerebral arteries.  After the infusion of contrast material, a normal enhancement pattern is noted.   This MRI  of the brain with and without contrast shows the following: 1.   Mild generalized cortical atrophy, stable compared to the 08/11/2019 MRI. 2.   Scattered T2/FLAIR hyperintense foci, predominantly in the subcortical and deep white matter of the hemispheres, most consistent with chronic microvascular ischemic change.  This is stable compared to the 08/11/2019 MRI. 3.   Normal enhancement pattern no acute findings. INTERPRETING PHYSICIAN: Richard A. Felecia Shelling, MD, PhD, FAAN Certified in  Neuroimaging by [employer]   CT CHEST ABDOMEN PELVIS W CONTRAST  Result Date: 10/26/2019 CLINICAL DATA:  Rapidly progressive dementia, rule out malignancy EXAM: CT CHEST, ABDOMEN, AND PELVIS WITH CONTRAST TECHNIQUE: Multidetector CT imaging of the chest, abdomen and pelvis was performed following the standard protocol during bolus administration of intravenous contrast. CONTRAST:  177mL OMNIPAQUE IOHEXOL 300 MG/ML  SOLN COMPARISON:  08/31/2019  FINDINGS: CT CHEST FINDINGS Cardiovascular: Heart size normal. No pericardial effusion. Central pulmonary arteries unremarkable. Adequate contrast opacification of the thoracic aorta with no evidence of dissection, aneurysm, or stenosis. There is classic 3-vessel brachiocephalic arch anatomy without proximal stenosis. No significant atheromatous change. Mediastinum/Nodes: No mass or adenopathy. Lungs/Pleura: No pleural effusion. No pneumothorax. Linear scarring or subsegmental atelectasis medially in the left lower lobe. Dependent atelectasis posteriorly in both lower lobes. Musculoskeletal: Orthopedic anchor in the right humeral head. Mild superior endplate compression deformities of T8 and T10. No definite acute fracture. CT ABDOMEN PELVIS FINDINGS Hepatobiliary: No focal liver abnormality is seen. No gallstones, gallbladder wall thickening, or biliary dilatation. Pancreas: Unremarkable. No pancreatic ductal dilatation or surrounding inflammatory changes. Spleen: Normal in size without focal abnormality. Adrenals/Urinary Tract: Stable left adrenal fullness. Kidneys enhance normally without mass or hydronephrosis. Urinary bladder physiologically distended. Stomach/Bowel: Stomach is nondistended. Small bowel decompressed. Appendix not discretely identified. No pericecal inflammatory/edematous change. The colon is nondilated, unremarkable. Vascular/Lymphatic: Minimal calcified aortoiliac plaque. No aneurysm or stenosis. Portal vein patent. No abdominal or pelvic adenopathy. Reproductive: Status post hysterectomy. No adnexal masses. Other: No ascites.  Bilateral pelvic phleboliths.  No free air. Musculoskeletal: Small supraumbilical hernia containing only mesenteric fat. Lumbar levoscoliosis apex L2 without underlying vertebral anomaly. No fracture or worrisome bone lesion. IMPRESSION: 1. No evidence of primary or metastatic disease. 2. Small supraumbilical hernia containing only mesenteric fat. Aortic Atherosclerosis  (ICD10-I70.0). Electronically Signed   By: Lucrezia Europe M.D.   On: 10/26/2019 14:39   EEG adult  Result Date: 10/21/2019 Lora Havens, MD     10/21/2019  7:50 AM Patient Name: Hannah Padilla MRN: [id number] Epilepsy Attending: Lora Havens Referring Physician/Provider: Dr Roland Rack Date: 10/20/2019 Duration: 24.40mins Patient history: 69yo F with ams, hallucination, concern for rapidly progressing dementia. EEG to evaluate for seizure. Level of alertness: Awake AEDs during EEG study: None Technical aspects: This EEG study was done with scalp electrodes positioned according to the 10-20 International system of electrode placement. Electrical activity was acquired at a sampling rate of 500Hz  and reviewed with a high frequency filter of 70Hz  and a low frequency filter of 1Hz . EEG data were recorded continuously and digitally stored. Description: No clear posterior dominant rhythm was seen. EEG showed continuous generalized polymorphic 6-9Hz  theta-alpha activity. Two episodes of right hand tremor like movements were recorded. Concomitant eeg before, during and after the episodes didn't show eeg change to suggest seizure.   Hyperventilation and photic stimulation were not performed.   ABNORMALITY -Continuous slow, generalized IMPRESSION: This study is suggestive of mild to moderate diffuse encephalopathy, nonspecific etiology. No seizures or epileptiform  discharges were seen throughout the recording. Tow episodes of right hand tremor like movements were recorded without concomitant eeg change and were most likely not epileptic. However, focal motor seizures may not be seen on scalp eeg. Therefore, clinical correlation is recommended. Priyanka [name]   DG FL GUIDED LUMBAR PUNCTURE  Result Date: 10/15/2019 CLINICAL DATA:  Rapidly progressive dementia. EXAM: DIAGNOSTIC LUMBAR PUNCTURE UNDER FLUOROSCOPIC GUIDANCE FLUOROSCOPY TIME:  Fluoroscopy time: 14 seconds Radiation Exposure Index (if provided by the  fluoroscopic device): 1.0 mGy Number of Acquired Spot Images: 0 PROCEDURE: Informed consent was obtained from the patient prior to the procedure, including potential complications of headache, allergy, and pain. With the patient left lateral decubitus, the lower back was prepped with Betadine. 1% Lidocaine was used for local anesthesia. Lumbar puncture was performed at the L3-L4 level via a left interlaminar approach using a 3.5 inch 20 gauge needle with return of clear, colorless CSF with an opening pressure of 14 cm water. 14 ml of CSF were obtained for laboratory studies. The patient tolerated the procedure well and there were no apparent complications. IMPRESSION: Technically successful fluoroscopically guided lumbar puncture. Electronically Signed   By: Titus Dubin M.D.   On: 10/15/2019 09:22     The results of significant diagnostics from this hospitalization (including imaging, microbiology, ancillary and laboratory) are listed below for reference.     Microbiology: No results found for this or any previous visit (from the past 240 hour(s)).   Labs: BNP (last 3 results) No results for input(s): BNP in the last 8760 hours. Basic Metabolic Panel: No results for input(s): NA, K, CL, CO2, GLUCOSE, BUN, CREATININE, CALCIUM, MG, PHOS in the last 168 hours. Liver Function Tests: No results for input(s): AST, ALT, ALKPHOS, BILITOT, PROT, ALBUMIN in the last 168 hours. No results for input(s): LIPASE, AMYLASE in the last 168 hours. No results for input(s): AMMONIA in the last 168 hours. CBC: No results for input(s): WBC, NEUTROABS, HGB, HCT, MCV, PLT in the last 168 hours. Cardiac Enzymes: No results for input(s): CKTOTAL, CKMB, CKMBINDEX, TROPONINI in the last 168 hours. BNP: Invalid input(s): POCBNP CBG: Recent Labs  Lab 11/02/19 1123 11/06/19 0935  GLUCAP 89 104*   D-Dimer No results for input(s): DDIMER in the last 72 hours. Hgb A1c No results for input(s): HGBA1C in the last  72 hours. Lipid Profile No results for input(s): CHOL, HDL, LDLCALC, TRIG, CHOLHDL, LDLDIRECT in the last 72 hours. Thyroid function studies No results for input(s): TSH, T4TOTAL, T3FREE, THYROIDAB in the last 72 hours.  Invalid input(s): FREET3 Anemia work up No results for input(s): VITAMINB12, FOLATE, FERRITIN, TIBC, IRON, RETICCTPCT in the last 72 hours. Urinalysis    Component Value Date/Time   COLORURINE YELLOW 10/20/2019 1151   APPEARANCEUR CLEAR 10/20/2019 1151   LABSPEC 1.012 10/20/2019 1151   LABSPEC 1.015 10/05/2019 0929   PHURINE 6.0 10/20/2019 1151   GLUCOSEU NEGATIVE 10/20/2019 1151   HGBUR NEGATIVE 10/20/2019 1151   BILIRUBINUR NEGATIVE 10/20/2019 1151   BILIRUBINUR negative 10/05/2019 0929   BILIRUBINUR NEG 11/26/2011 0931   KETONESUR NEGATIVE 10/20/2019 1151   PROTEINUR NEGATIVE 10/20/2019 1151   UROBILINOGEN negative 11/26/2011 0931   UROBILINOGEN 0.2 03/25/2011 0633   NITRITE NEGATIVE 10/20/2019 1151   LEUKOCYTESUR NEGATIVE 10/20/2019 1151   Sepsis Labs Invalid input(s): PROCALCITONIN,  WBC,  LACTICIDVEN Microbiology No results found for this or any previous visit (from the past 240 hour(s)).   Time coordinating discharge:  I have spent 35 minutes face  to face with the patient and on the ward discussing the patients care, assessment, plan and disposition with other care givers. >50% of the time was devoted counseling the patient about the risks and benefits of treatment/Discharge disposition and coordinating care.   SIGNED:   Shelly Coss, MD  [facility] 11/09/2019, 11:18 AM   If 7PM-7AM, please contact night-coverage

## 2019-11-09 NOTE — Progress Notes (Signed)
Patient arrived on unit from [facility]. Patient alert and orientated x2, no complaints of pain. Patient is very anxious and needs to be approached calmly with a soft voice.

## 2019-11-09 NOTE — Progress Notes (Addendum)
Inpatient Rehabilitation Medication Review by a Pharmacist  A complete drug regimen review was completed for this patient to identify any potential clinically significant medication issues.  Clinically significant medication issues were identified:  yes   Type of Medication Issue Identified Description of Issue Urgent (address now) Non-Urgent (address on AM team rounds) Plan Plan Accepted by Provider? (Yes / No / Pending AM Rounds)  Drug Interaction(s) (clinically significant)       Duplicate Therapy       Allergy       No Medication Administration End Date       Incorrect Dose       Additional Drug Therapy Needed       Other  Vitamin D 50,000 unit every 7 days was not resumed as per discharge summary. Patient takes every Wednesday. Last dose 10/14/19 per documentation. Medication was not resumed inpatient while at [facility].  Non-Urgent Follow-up with team in AM to discuss restart.  Yes    Name of provider notified for urgent issues identified: Marlowe Shores, PA  Provider Method of Notification: secure chat   For non-urgent medication issues to be resolved on team rounds tomorrow morning a [facility] Secure Chat Handoff was sent to: n/a, med reconciliation completed after 5 PM   Time spent performing this drug regimen review (minutes):  10 minutes   Hannah Padilla, PharmD, [place], AAHIVP, CPP Infectious Disease Pharmacist 11/10/2019 9:22 AM

## 2019-11-09 NOTE — Progress Notes (Signed)
Izora Ribas, MD  Physician  Physical Medicine and Rehabilitation  Consult Note     Signed  Date of Service:  10/30/2019 12:29 PM      Related encounter: ED to Hosp-Admission (Current) from 10/20/2019 in [place] [facility]      Signed      Expand All Collapse All  Show:Clear all [x] Manual[x] Template[] Copied  Added by: [x] Angiulli, Lavon Paganini, PA-C[x] Raulkar, Clide Deutscher, MD  [] Hover for details          Physical Medicine and Rehabilitation Consult Reason for Consult: Decreased functional ability with altered mental status Referring Physician: [facility]     HPI: Hannah Padilla is a 69 y.o. right-handed female with history of hyperthyroidism, recurrent UTIs, [place] spotted fever.  Presented 10/20/2019 with progressive decline with noted apraxia over the last 3 months.  Prior to onset of symptoms patient was healthy able to complete all of her ADLs without assistance.  History was obtained by son.  Most recent MRI showed mild generalized cortical atrophy stable compared to scan of 08/11/2019.  Scattered T2 FLAIR hyperintense foci predominantly in the subcortical and deep white matter of the hemispheres most consistent with chronic microvascular ischemic change.  No acute findings.  CT of chest abdomen pelvis showed no evidence of primary metastatic disease.  Admission chemistries potassium 3.4 total protein 6.4 AST 49 ALT 50, hemoglobin 14.7, SARS coronavirus negative, TSH 0.851, ammonia level 17, ESR of 2, paraneoplastic CSF study negative.  Neurology follow-up suspect progressive dementia of unknown etiology she did receive 5 days of IV steroids followed by initiation of Sinemet for trial.  Dysphagia #3 thin liquid diet.  Subcutaneous Lovenox for DVT prophylaxis.  Therapy evaluations completed with recommendations of physical medicine rehab consult.     Review of Systems  Constitutional: Negative for chills and fever.  HENT: Negative for hearing loss.   Eyes: Negative for  blurred vision and double vision.  Respiratory: Negative for cough and shortness of breath.   Cardiovascular: Negative for chest pain and palpitations.  Gastrointestinal: Positive for constipation. Negative for heartburn, nausea and vomiting.  Genitourinary: Negative for dysuria, flank pain and hematuria.  Musculoskeletal: Positive for myalgias.  Skin: Negative for rash.  Neurological:       Apraxia  Psychiatric/Behavioral: Positive for memory loss. The patient has insomnia.   All other systems reviewed and are negative.       Past Medical History:  Diagnosis Date  . Colon polyp 2015  . Elevated serum free T4 level      husband report  . Farsightedness      wears glasses  . Mood change 2021    major change in affect, health issues with multiple ED visits, hospitalization, starting 07/10/2019  . [facility] spotted fever) 2021  . Urinary tract infection           Past Surgical History:  Procedure Laterality Date  . BREAST REDUCTION SURGERY      . CARPAL TUNNEL RELEASE        right  . COLONOSCOPY   [id number]    [place], [place]  . lichenoid keratosis   09/05/11    Skin, left anterior shouldee  . PARTIAL HYSTERECTOMY        still has ovaries  . WISDOM TOOTH EXTRACTION             Family History  Problem Relation Age of Onset  . Alzheimer's disease Mother    . Other Father  cognitive decline  . COPD Father          smoker  . Protein S deficiency Other    . Heart disease Neg Hx    . Diabetes Neg Hx    . Cancer Neg Hx    . Stroke Neg Hx    . Hypertension Neg Hx    . Hyperlipidemia Neg Hx      Social History:  reports that she has never smoked. She has never used smokeless tobacco. She reports previous alcohol use of about 5.0 standard drinks of alcohol per week. She reports that she does not use drugs. Allergies: No Known Allergies       Medications Prior to Admission  Medication Sig Dispense Refill  . acetaminophen (TYLENOL) 325 MG tablet Take 2 tablets (650 mg  total) by mouth every 6 (six) hours as needed for mild pain (or Fever >/= 101).      . bisacodyl (DULCOLAX) 5 MG EC tablet Take 10 mg by mouth daily as needed for moderate constipation.      . diazepam (VALIUM) 5 MG tablet Take 1 tablet (5 mg total) by mouth every 12 (twelve) hours as needed for anxiety. (Patient taking differently: Take 2.5 mg by mouth every 12 (twelve) hours as needed for anxiety. ) 20 tablet 0  . megestrol (MEGACE) 40 MG/ML suspension Take 5 mLs (200 mg total) by mouth daily. (Patient taking differently: Take 400 mg by mouth daily. ) 240 mL 0  . methimazole (TAPAZOLE) 5 MG tablet Take 1 tablet (5 mg total) by mouth 3 (three) times daily. 45 tablet 0  . omeprazole (PRILOSEC) 40 MG capsule Take 1 capsule by mouth once daily (Patient taking differently: Take 40 mg by mouth daily. ) 30 capsule 0  . ondansetron (ZOFRAN-ODT) 8 MG disintegrating tablet Take 1 tablet (8 mg total) by mouth every 8 (eight) hours as needed. (Patient taking differently: Take 8 mg by mouth every 8 (eight) hours as needed for nausea or vomiting. ) 20 tablet 0  . propranolol ER (INDERAL LA) 60 MG 24 hr capsule Take 1 capsule (60 mg total) by mouth daily. 30 capsule 1  . sertraline (ZOLOFT) 50 MG tablet Take 50 mg by mouth daily.      . Vitamin D, Ergocalciferol, (DRISDOL) 1.25 MG (50000 UNIT) CAPS capsule Take 1 capsule (50,000 Units total) by mouth every 7 (seven) days. (Patient taking differently: Take 50,000 Units by mouth every Wednesday. ) 12 capsule 1      Home: Home Living Family/patient expects to be discharged to:: Private residence Living Arrangements: Spouse/significant other Available Help at Discharge: Family, Available 24 hours/day Type of Home: House Home Access: Stairs to enter Home Layout: One level Bathroom Shower/Tub: Tub/shower unit Home Equipment: Bedside commode  Lives With: Spouse  Functional History: Prior Function Level of Independence: Needs assistance Gait / Transfers  Assistance Needed: recently has needed help to walk and transfer, gait for very short trips of a few steps ADL's / Homemaking Assistance Needed: husband cares for home, assists wife to dress and bathe Communication / Swallowing Assistance Needed: soft vocal quality; mumbling at times and sometimes coherant Comments: per husband has times wiht blank stairing Functional Status:  Mobility: Bed Mobility Overal bed mobility: Needs Assistance Bed Mobility: Rolling, Sidelying to Sit Rolling: Max assist Sidelying to sit: Max assist Supine to sit: Max assist, +2 for physical assistance Sit to supine: Max assist, +2 for physical assistance Sit to sidelying: Total assist, +2 for physical assistance  General bed mobility comments: rolled to her left Transfers Overall transfer level: Needs assistance Equipment used: 1 person hand held assist Transfers: Sit to/from Stand Sit to Stand: Max assist, From elevated surface General transfer comment: pt is assisting with legs and helping to shuffle feet Ambulation/Gait Ambulation/Gait assistance: Min assist Gait Distance (Feet): 12 Feet (4 x 3') Assistive device: 1 person hand held assist Gait Pattern/deviations: Step-to pattern, Decreased stride length, Wide base of support General Gait Details: unable Gait velocity: reduced Gait velocity interpretation: <1.8 ft/sec, indicate of risk for recurrent falls   ADL: ADL Overall ADL's : Needs assistance/impaired Functional mobility during ADLs: Maximal assistance, +2 for physical assistance General ADL Comments: total A for ADL tasks   Cognition: Cognition Overall Cognitive Status: Impaired/Different from baseline Arousal/Alertness: Lethargic Orientation Level: Oriented to person Attention: Focused, Sustained, Selective Focused Attention: Impaired Focused Attention Impairment: Verbal basic Sustained Attention: Impaired Sustained Attention Impairment: Verbal basic Memory: Impaired Memory  Impairment: Storage deficit, Decreased recall of new information Awareness: Impaired Awareness Impairment: Emergent impairment Problem Solving: Impaired Problem Solving Impairment: Verbal complex Executive Function: Reasoning, Sequencing, Decision Making Reasoning: Impaired Reasoning Impairment: Verbal basic, Verbal complex Sequencing: Impaired Sequencing Impairment: Verbal basic, Verbal complex Decision Making: Impaired Decision Making Impairment: Verbal basic, Verbal complex Cognition Arousal/Alertness: Awake/alert Behavior During Therapy: Flat affect Overall Cognitive Status: Impaired/Different from baseline Area of Impairment: Attention, Memory, Following commands, Safety/judgement, Awareness, Problem solving Orientation Level: Situation, Time Current Attention Level: Selective Memory: Decreased recall of precautions, Decreased short-term memory Following Commands: Follows one step commands inconsistently Safety/Judgement: Decreased awareness of safety, Decreased awareness of deficits Awareness: Intellectual Problem Solving: Slow processing, Requires verbal cues, Requires tactile cues General Comments: repeated instructions to manage cognition Difficult to assess due to: Impaired communication   Blood pressure 123/78, pulse 82, temperature 98.4 F (36.9 C), temperature source Oral, resp. rate 18, SpO2 98 %. Physical Exam   General: Makes eye contact, No apparent distress HEENT: Head is normocephalic, cannot visually track Neck: Supple without JVD or lymphadenopathy Heart: Reg rate and rhythm. No murmurs rubs or gallops Chest: CTA bilaterally without wheezes, rales, or rhonchi; no distress Abdomen: Soft, non-tender, non-distended, bowel sounds positive. Extremities: No clubbing, cyanosis, or edema. Pulses are 2+ Skin: Clean and intact without signs of breakdown Neuro: Patient is alert.  Makes eye contact with examiner.  Mild stuttering.  Provides her name and place but could  not name hospital without cues.  She was able to say she was in [place].  Follow simple one-step commands.  Limited medical historian. Unable to participate in MMT but can squeeze my hand with right hand. +tremor.  Psych: Pt's affect is appropriate. Pt is cooperativ      Lab Results Last 24 Hours  No results found for this or any previous visit (from the past 24 hour(s)).   Imaging Results (Last 48 hours)  No results found.       Assessment/Plan: Diagnosis: Impaired cognition and mobility of unknown origin, undergoing workup for CJD/autoimmune disease 1. Does the need for close, 24 hr/day medical supervision in concert with the patient's rehab needs make it unreasonable for this patient to be served in a less intensive setting? Yes 2. Co-Morbidities requiring supervision/potential complications: hyperthyroidism, recurrent UTIs, [facility] spotted fever, B12 deficiency, tremor 3. Due to bladder management, bowel management, safety, skin/wound care, disease management, medication administration, pain management and patient education, does the patient require 24 hr/day rehab nursing? Yes 4. Does the patient require coordinated care of a physician, rehab  nurse, therapy disciplines of PT, OT, SLP to address physical and functional deficits in the context of the above medical diagnosis(es)? Yes Addressing deficits in the following areas: balance, endurance, locomotion, strength, transferring, bowel/bladder control, bathing, dressing, feeding, grooming, toileting, cognition, speech, language, swallowing and psychosocial support 5. Can the patient actively participate in an intensive therapy program of at least 3 hrs of therapy per day at least 5 days per week? Yes 6. The potential for patient to make measurable gains while on inpatient rehab is excellent 7. Anticipated functional outcomes upon discharge from inpatient rehab are min assist  with PT, min assist with OT, min assist with  SLP. 8. Estimated rehab length of stay to reach the above functional goals is: 2-3 weeks 9. Anticipated discharge destination: Home 10. Overall Rehab/Functional Prognosis: fair   RECOMMENDATIONS: This patient's condition is appropriate for continued rehabilitative care in the following setting: CIR Patient has agreed to participate in recommended program. Yes Note that insurance prior authorization may be required for reimbursement for recommended care.   Comment: Thank you for this consult. Admission coordinator to follow.    I have personally performed a face to face diagnostic evaluation, including, but not limited to relevant history and physical exam findings, of this patient and developed relevant assessment and plan.  Additionally, I have reviewed and concur with the physician assistant's documentation above.   Leeroy Cha, MD   Cathlyn Parsons, PA-C 10/30/2019        Revision History                     Routing History           Note Details  Author Izora Ribas, MD File Time 10/30/2019  1:43 PM  Author Type Physician Status Signed  Last Editor Izora Ribas, MD Service Physical Medicine and Rehabilitation

## 2019-11-09 NOTE — Progress Notes (Signed)
Inpatient Rehabilitation Admissions Coordinator  Insurance appeal has overturned denial for Cir. Cir bed is available and insurance has approved admit. I met with patient and spouse at bedside. I will contact MD, acute team and TOC to make the arrangements to admit today.  Danne Baxter, RN, MSN Rehab Admissions Coordinator [phone] 11/09/2019 10:17 AM

## 2019-11-09 NOTE — H&P (Signed)
Physical Medicine and Rehabilitation Admission H&P        Chief Complaint  Patient presents with  . Altered Mental Status  : HPI: Hannah Padilla is a 69 year old right-handed female with history of hyperthyroidism maintained on Tapazole, recurrent UTIs, [place] spotted fever.  Presented 10/20/2019 with progressive decline with noted apraxia over the last 3 months as well as intermittent panic attacks generalized weakness and falls.  Prior to onset of symptoms patient was healthy able to complete all of her ADLs without assistance.  Most recent MRI showed mild generalized cortical atrophy stable compared to scan of 08/11/2019.  Scattered T2 FLAIR hyperintense foci predominantly in the subcortical and deep white matter of the hemispheres most consistent with chronic microvascular ischemic change.  No acute findings.  EEG consistent with mild to moderate diffuse encephalopathy.  No seizure.  CT of the chest abdomen pelvis showed no evidence of primary metastatic disease.  Admission chemistries potassium 3.4 total protein 6.4 AST 49 ALT 50 hemoglobin 14.7 SARS coronavirus negative, TSH 0.851, free T4 1.96, T3 total 73, T4 total 10.5 ammonia level 17, ESR of 2, paraneoplastic CSF study negative.  Neurology follow-up suspect progressive dementia of unknown etiology.  She did receive 5 days of IV steroids during her hospital course as well as initiation of Sinemet for trial.  She is on a mechanical soft diet thin liquids.  Subcutaneous Lovenox for DVT prophylaxis.  Thyroid function studies were reviewed by endocrinology Dr. Buddy Duty who recommends follow-up outpatient with ambulatory referral with recommendations to continue Tapazole as well as propranolol.  Therapy evaluations completed and patient was admitted for a comprehensive rehab program.   Review of Systems  Constitutional: Positive for malaise/fatigue. Negative for fever.  HENT: Negative for hearing loss.   Eyes: Negative for blurred  vision and double vision.  Respiratory: Negative for cough and shortness of breath.   Cardiovascular: Positive for leg swelling. Negative for chest pain and palpitations.  Gastrointestinal: Positive for constipation. Negative for heartburn and nausea.  Genitourinary: Negative for dysuria, flank pain and hematuria.  Musculoskeletal: Positive for falls.  Skin: Negative for rash.  Neurological: Positive for tremors and weakness.  Psychiatric/Behavioral: Positive for hallucinations and memory loss. The patient has insomnia.        Panic attacks with mood changes as well as anxiety  All other systems reviewed and are negative.   Past Medical History:  Diagnosis Date  . Colon polyp 2015  . Elevated serum free T4 level      husband report  . Farsightedness      wears glasses  . Mood change 2021    major change in affect, health issues with multiple ED visits, hospitalization, starting 07/10/2019  . [facility] spotted fever) 2021  . Urinary tract infection           Past Surgical History:  Procedure Laterality Date  . BREAST REDUCTION SURGERY      . CARPAL TUNNEL RELEASE        right  . COLONOSCOPY   1916    [place]  . lichenoid keratosis   09/05/11    Skin, left anterior shouldee  . PARTIAL HYSTERECTOMY        still has ovaries  . WISDOM TOOTH EXTRACTION        Family History  Problem Relation Age of Onset  . Alzheimer's disease Mother    . Other Father          cognitive  decline  . COPD Father          smoker  . Protein S deficiency Other    . Heart disease Neg Hx    . Diabetes Neg Hx    . Cancer Neg Hx    . Stroke Neg Hx    . Hypertension Neg Hx    . Hyperlipidemia Neg Hx      Social History:  reports that she has never smoked. She has never used smokeless tobacco. She reports previous alcohol use of about 5.0 standard drinks of alcohol per week. She reports that she does not use drugs. Allergies: No Known Allergies       Medications Prior to Admission    Medication Sig Dispense Refill  . acetaminophen (TYLENOL) 325 MG tablet Take 2 tablets (650 mg total) by mouth every 6 (six) hours as needed for mild pain (or Fever >/= 101).      . bisacodyl (DULCOLAX) 5 MG EC tablet Take 10 mg by mouth daily as needed for moderate constipation.      . diazepam (VALIUM) 5 MG tablet Take 1 tablet (5 mg total) by mouth every 12 (twelve) hours as needed for anxiety. (Patient taking differently: Take 2.5 mg by mouth every 12 (twelve) hours as needed for anxiety. ) 20 tablet 0  . megestrol (MEGACE) 40 MG/ML suspension Take 5 mLs (200 mg total) by mouth daily. (Patient taking differently: Take 400 mg by mouth daily. ) 240 mL 0  . methimazole (TAPAZOLE) 5 MG tablet Take 1 tablet (5 mg total) by mouth 3 (three) times daily. 45 tablet 0  . omeprazole (PRILOSEC) 40 MG capsule Take 1 capsule by mouth once daily (Patient taking differently: Take 40 mg by mouth daily. ) 30 capsule 0  . ondansetron (ZOFRAN-ODT) 8 MG disintegrating tablet Take 1 tablet (8 mg total) by mouth every 8 (eight) hours as needed. (Patient taking differently: Take 8 mg by mouth every 8 (eight) hours as needed for nausea or vomiting. ) 20 tablet 0  . propranolol ER (INDERAL LA) 60 MG 24 hr capsule Take 1 capsule (60 mg total) by mouth daily. 30 capsule 1  . sertraline (ZOLOFT) 50 MG tablet Take 50 mg by mouth daily.      . Vitamin D, Ergocalciferol, (DRISDOL) 1.25 MG (50000 UNIT) CAPS capsule Take 1 capsule (50,000 Units total) by mouth every 7 (seven) days. (Patient taking differently: Take 50,000 Units by mouth every Wednesday. ) 12 capsule 1      Drug Regimen Review Drug regimen was reviewed and remains appropriate with no significant issues identified   Home: Home Living Family/patient expects to be discharged to:: Private residence Living Arrangements: Spouse/significant other Available Help at Discharge: Family, Available 24 hours/day Type of Home: House Home Access: Stairs to enter Home  Layout: One level Bathroom Shower/Tub: Tub/shower unit Home Equipment: Bedside commode  Lives With: Spouse   Functional History: Prior Function Level of Independence: Needs assistance Gait / Transfers Assistance Needed: recently has needed help to walk and transfer, gait for very short trips of a few steps ADL's / Homemaking Assistance Needed: husband cares for home, assists wife to dress and bathe Communication / Swallowing Assistance Needed: soft vocal quality; mumbling at times and sometimes coherant Comments: per husband has times wiht blank stairing   Functional Status:  Mobility: Bed Mobility Overal bed mobility: Needs Assistance Bed Mobility: Supine to Sit, Sit to Supine Rolling: Mod assist Sidelying to sit: Max assist Supine to sit: Mod assist, +  2 for physical assistance Sit to supine: Max assist, +2 for physical assistance Sit to sidelying: Mod assist, +2 for physical assistance, +2 for safety/equipment General bed mobility comments: assisted LE's and brought trunk up and forward with use of the pad to pivot. Transfers Overall transfer level: Needs assistance Equipment used: 2 person hand held assist Transfers: Sit to/from Stand, Stand Pivot Transfers Sit to Stand: Mod assist, +2 safety/equipment (x3) Stand pivot transfers: Mod assist, +2 physical assistance General transfer comment: cues for hand placement, assist to come forward and  and boost.  With a face to face assist, pt able to w/shift and move her feet in a more "tin-soldier-like" pattern to step forward and back in pivot to chair. Ambulation/Gait Ambulation/Gait assistance: Max assist, Mod assist, +2 physical assistance Gait Distance (Feet): 3 Feet (forward and back.) Assistive device: 1 person hand held assist, 2 person hand held assist Gait Pattern/deviations: Step-to pattern General Gait Details: pt needed w/shift, stability and each LE advancement assist.  Pt generally unable to unweight or even slide her  feet today. Gait velocity: reduced Gait velocity interpretation: <1.8 ft/sec, indicate of risk for recurrent falls   ADL: ADL Overall ADL's : Needs assistance/impaired Grooming: Minimal assistance, Wash/dry hands, Sitting Grooming Details (indicate cue type and reason): sitting EOB to wipe hands with min assist for initation of task  Lower Body Bathing: Maximal assistance, Sitting/lateral leans Lower Body Bathing Details (indicate cue type and reason): applying lotion to BLEs with max assist for hand over hand support, forward lean and task attention  Functional mobility during ADLs: Maximal assistance, +2 for physical assistance, Caregiver able to provide necessary level of assistance General ADL Comments: total A for ADL tasks   Cognition: Cognition Overall Cognitive Status: Impaired/Different from baseline Arousal/Alertness: Lethargic Orientation Level: Oriented to person, Disoriented to place, Disoriented to time, Disoriented to situation Attention: Focused, Sustained, Selective Focused Attention: Impaired Focused Attention Impairment: Verbal basic Sustained Attention: Impaired Sustained Attention Impairment: Verbal basic Memory: Impaired Memory Impairment: Storage deficit, Decreased recall of new information Awareness: Impaired Awareness Impairment: Emergent impairment Problem Solving: Impaired Problem Solving Impairment: Verbal complex Executive Function: Reasoning, Sequencing, Decision Making Reasoning: Impaired Reasoning Impairment: Verbal basic, Verbal complex Sequencing: Impaired Sequencing Impairment: Verbal basic, Verbal complex Decision Making: Impaired Decision Making Impairment: Verbal basic, Verbal complex Cognition Arousal/Alertness: Awake/alert Behavior During Therapy: Anxious, Flat affect Overall Cognitive Status: Impaired/Different from baseline Area of Impairment: Attention, Safety/judgement, Problem solving, Awareness, Following commands,  Memory Orientation Level: Situation Current Attention Level: Sustained Memory: Decreased short-term memory Following Commands: Follows one step commands inconsistently Safety/Judgement: Decreased awareness of safety Awareness: Intellectual Problem Solving: Slow processing, Requires verbal cues, Requires tactile cues, Decreased initiation, Difficulty sequencing General Comments: pt anxious and reports fatigued, husband assuring her throughout session.  Follows simple commands with increased time but requires multimodal cueing for initation, completion and sequencing of tasks  Difficult to assess due to: Impaired communication   Physical Exam: Blood pressure (!) 141/97, pulse 87, temperature 98.4 F (36.9 C), temperature source Oral, resp. rate 17, SpO2 96 %. Physical Exam Constitutional:      Comments: Frail, shaking  HENT:     Head: Normocephalic.     Right Ear: External ear normal.     Left Ear: External ear normal.     Nose: Nose normal.     Mouth/Throat:     Pharynx: No oropharyngeal exudate or posterior oropharyngeal erythema.  Eyes:     Extraocular Movements: Extraocular movements intact.     Pupils:  Pupils are equal, round, and reactive to light.  Cardiovascular:     Rate and Rhythm: Normal rate and regular rhythm.     Heart sounds: No murmur heard.  No friction rub.  Pulmonary:     Effort: Pulmonary effort is normal. No respiratory distress.     Breath sounds: No wheezing.  Abdominal:     General: Bowel sounds are normal. There is no distension.     Tenderness: There is no abdominal tenderness.  Musculoskeletal:        General: Tenderness present.     Cervical back: Normal range of motion. No tenderness.  Skin:    General: Skin is warm.     Coloration: Skin is pale.  Neurological:     Comments: Pt is alert. Tends to stare forward unless cued. Pill rolling tremor Right hand, keeps left hand clenched. Can move RUE with cueing and extra time. Seems to move legs more  freely but needs cues also. DTR's 2+. Decreased attention, initiation. Speech dysarthric. Insight fair but limited. Anxiety seems to fuel lack of attention and tremors. Motor exam difficult to assess d/t inconsistent effort. Senses pain in all 4's.   Psychiatric:     Comments: Highly anxious, anxiety often increased by topics of conversation.         Lab Results Last 48 Hours        Results for orders placed or performed during the hospital encounter of 10/20/19 (from the past 48 hour(s))  Glucose, capillary     Status: None    Collection Time: 11/02/19 11:23 AM  Result Value Ref Range    Glucose-Capillary 89 70 - 99 mg/dL      Comment: Glucose reference range applies only to samples taken after fasting for at least 8 hours.    Comment 1 Notify RN      Comment 2 Document in Chart        Imaging Results (Last 48 hours)  No results found.           Medical Problem List and Plan: 1.  Progressive decrease in functional mobility, impaired cognition, apraxia with panic attacks generalized weakness and falls secondary to acute metabolic encephalopathy/progressive dementia of unknown etiology.               -f/u lab work up as ordered by neurology             -Continue Sinemet trial 25-100 mg 3 times daily             -patient may shower             -ELOS/Goals: 14-16 days, min assist goals with PT, OT, SLP 2.  Antithrombotics: -DVT/anticoagulation: Lovenox             -antiplatelet therapy: N/A 3. Pain Management: Tylenol as needed 4. Mood: Zoloft 50 mg daily, Valium 12.10m q12 prn although it appears that she has been using more often than not at home.             -will ask neuropsychology to see as well             -pt sees ?psychiatry as outpt             -antipsychotic agents: N/A 5. Neuropsych: This patient is not capable of making decisions on her own behalf. 6. Skin/Wound Care: Routine skin checks 7. Fluids/Electrolytes/Nutrition: Routine in and outs with follow-up  chemistries 8.  Hyper thyroidism.  Continue Tapazole 5 mg 3  times daily as well as Inderal 60 mg daily.  Patient is to see Dr. Buddy Duty of endocrinology outpatient             Lavon Paganini Paulden, PA-C 11/04/2019  I have personally performed a face to face diagnostic evaluation of this patient and formulated the key components of the plan.  Additionally, I have personally reviewed laboratory data, imaging studies, as well as relevant notes and concur with the physician assistant's documentation above.  The patient's status has not changed from the original H&P.  Any changes in documentation from the acute care chart have been noted above.  Meredith Staggers, MD, Mell[facility] Drown

## 2019-11-09 NOTE — TOC Transition Note (Signed)
Transition of Care [facility]) - CM/SW Discharge Note   Patient Details  Name: Hannah Padilla MRN: [id number] Date of Birth: 21-Aug-1950  [facility] CM/SW Contact:  Pollie Friar, RN Phone Number: 11/09/2019, 12:12 PM   Clinical Narrative:    Pt is discharging to CIR today. CM signing off.   Final next level of care: IP Rehab Facility Barriers to Discharge: No Barriers Identified   Patient Goals and CMS Choice Patient states their goals for this hospitalization and ongoing recovery are:: Pt unable to participate in goal setting due to disorientation, Spouse agreeable to SNF. CMS Medicare.gov Compare Post Acute Care list provided to:: Patient Represenative (must comment) (Spouse, [name].) Choice offered to / list presented to : Spouse  Discharge Placement                       Discharge Plan and Services In-house Referral: Clinical Social Work                                   Social Determinants of Health (SDOH) Interventions     Readmission Risk Interventions No flowsheet data found.

## 2019-11-10 ENCOUNTER — Other Ambulatory Visit: Payer: Self-pay

## 2019-11-10 ENCOUNTER — Inpatient Hospital Stay (HOSPITAL_COMMUNITY): Payer: Medicare Other

## 2019-11-10 ENCOUNTER — Inpatient Hospital Stay (HOSPITAL_COMMUNITY): Payer: Medicare Other | Admitting: Occupational Therapy

## 2019-11-10 DIAGNOSIS — F064 Anxiety disorder due to known physiological condition: Secondary | ICD-10-CM

## 2019-11-10 DIAGNOSIS — R7401 Elevation of levels of liver transaminase levels: Secondary | ICD-10-CM

## 2019-11-10 LAB — COMPREHENSIVE METABOLIC PANEL
ALT: 50 U/L — ABNORMAL HIGH (ref 0–44)
AST: 264 U/L — ABNORMAL HIGH (ref 15–41)
Albumin: 3.1 g/dL — ABNORMAL LOW (ref 3.5–5.0)
Alkaline Phosphatase: 290 U/L — ABNORMAL HIGH (ref 38–126)
Anion gap: 11 (ref 5–15)
BUN: 9 mg/dL (ref 8–23)
CO2: 20 mmol/L — ABNORMAL LOW (ref 22–32)
Calcium: 9.2 mg/dL (ref 8.9–10.3)
Chloride: 109 mmol/L (ref 98–111)
Creatinine, Ser: 0.72 mg/dL (ref 0.44–1.00)
GFR calc non Af Amer: >60 mL/min (ref >60)
Glucose, Bld: 109 mg/dL — ABNORMAL HIGH (ref 70–99)
Potassium: 3.8 mmol/L (ref 3.5–5.1)
Sodium: 140 mmol/L (ref 135–145)
Total Bilirubin: 2.1 mg/dL — ABNORMAL HIGH (ref 0.3–1.2)
Total Protein: 5.6 g/dL — ABNORMAL LOW (ref 6.5–8.1)

## 2019-11-10 LAB — CBC WITH DIFFERENTIAL/PLATELET
Abs Immature Granulocytes: 0.04 10*3/uL (ref 0.00–0.07)
Basophils Absolute: 0 10*3/uL (ref 0.0–0.1)
Basophils Relative: 1 %
Eosinophils Absolute: 0.3 10*3/uL (ref 0.0–0.5)
Eosinophils Relative: 4 %
HCT: 40.2 % (ref 36.0–46.0)
Hemoglobin: 13.7 g/dL (ref 12.0–15.0)
Immature Granulocytes: 1 %
Lymphocytes Relative: 11 %
Lymphs Abs: 0.7 10*3/uL (ref 0.7–4.0)
MCH: 32.2 pg (ref 26.0–34.0)
MCHC: 34.1 g/dL (ref 30.0–36.0)
MCV: 94.6 fL (ref 80.0–100.0)
Monocytes Absolute: 0.4 10*3/uL (ref 0.1–1.0)
Monocytes Relative: 7 %
Neutro Abs: 5.2 10*3/uL (ref 1.7–7.7)
Neutrophils Relative %: 76 %
Platelets: 218 10*3/uL (ref 150–400)
RBC: 4.25 MIL/uL (ref 3.87–5.11)
RDW: 14.9 % (ref 11.5–15.5)
WBC: 6.6 10*3/uL (ref 4.0–10.5)
nRBC: 0 % (ref 0.0–0.2)

## 2019-11-10 MED ORDER — BOOST / RESOURCE BREEZE PO LIQD CUSTOM
1.0000 | Freq: Two times a day (BID) | ORAL | Status: DC
  Administered 2019-11-10 – 2019-12-01 (×27): 1 via ORAL
  Filled 2019-11-10: qty 1

## 2019-11-10 MED ORDER — MEGESTROL ACETATE 400 MG/10ML PO SUSP
400.0000 mg | Freq: Two times a day (BID) | ORAL | Status: DC
  Administered 2019-11-10 – 2019-11-13 (×7): 400 mg via ORAL
  Filled 2019-11-10 (×7): qty 10

## 2019-11-10 MED ORDER — VITAMIN D (ERGOCALCIFEROL) 1.25 MG (50000 UNIT) PO CAPS
50000.0000 [IU] | ORAL_CAPSULE | ORAL | Status: DC
  Administered 2019-11-10 – 2019-12-01 (×4): 50000 [IU] via ORAL
  Filled 2019-11-10 (×4): qty 1

## 2019-11-10 NOTE — Progress Notes (Signed)
Inpatient Rehabilitation  Patient information reviewed and entered into eRehab system by Alonda Weaber M. Kaydyn Chism, M.A., CCC/SLP, PPS Coordinator.  Information including medical coding, functional ability and quality indicators will be reviewed and updated through discharge.    

## 2019-11-10 NOTE — Evaluation (Signed)
Physical Therapy Assessment and Plan  Patient Details  Name: Hannah Padilla MRN: [id number] Date of Birth: March 20, 1950  PT Diagnosis: Abnormal posture, Abnormality of gait, Cognitive deficits, Difficulty walking, Dizziness and giddiness, Impaired cognition, Impaired sensation and Muscle weakness Rehab Potential: Good ELOS: 2-3 weeks   Today's Date: 11/10/2019 PT Individual Time: 1300-1400 PT Individual Time Calculation (min): 60 min    Hospital Problem: Principal Problem:   Progressive dementia with uncertain etiology (Early) Active Problems:   Acute metabolic encephalopathy   Anxiety disorder due to general medical condition   Past Medical History:  Past Medical History:  Diagnosis Date  . Anxiety   . Colon polyp 2015  . Elevated serum free T4 level    husband report  . Farsightedness    wears glasses  . Mood change 2021   major change in affect, health issues with multiple ED visits, hospitalization, starting 07/10/2019  . [facility] spotted fever) 2021  . Urinary tract infection    Past Surgical History:  Past Surgical History:  Procedure Laterality Date  . BREAST REDUCTION SURGERY    . CARPAL TUNNEL RELEASE     right  . COLONOSCOPY  [id number]   [place], [place]  . lichenoid keratosis  09/05/11   Skin, left anterior shouldee  . PARTIAL HYSTERECTOMY     still has ovaries  . WISDOM TOOTH EXTRACTION      Assessment & Plan Clinical Impression: Patient is a 69 y.o. year old right-handed female with history of hyperthyroidism maintained on Tapazole, recurrent UTIs, [place] spotted fever. Presented 10/20/2019 with progressive decline with noted apraxia over the last 3 months as well as intermittent panic attacks generalized weakness and falls. Prior to onset of symptoms patient was healthy able to complete all of her ADLs without assistance. Most recent MRI showed mild generalized cortical atrophy stable compared to scan of 08/11/2019. Scattered T2 FLAIR hyperintense foci  predominantly in the subcortical and deep white matter of the hemispheres most consistent with chronic microvascular ischemic change. No acute findings. EEG consistent with mild to moderate diffuse encephalopathy. No seizure. CT of the chest abdomen pelvis showed no evidence of primary metastatic disease. Admission chemistries potassium 3.4 total protein 6.4 AST 49 ALT 50 hemoglobin 14.7 SARS coronavirus negative, TSH 0.851, free T4 1.96, T3 total 73, T4 total 10.5 ammonia level 17, ESR of 2, paraneoplastic CSF study negative. Neurology follow-up suspect progressive dementia of unknown etiology. She did receive 5 days of IV steroids during her hospital course as well as initiation of Sinemet for trial. She is on a mechanical soft diet thin liquids. Subcutaneous Lovenox for DVT prophylaxis. Thyroid function studies were reviewed by endocrinology Dr. Buddy Duty who recommends follow-up outpatient with ambulatory referral with recommendations to continue Tapazole as well as propranolol. Therapy evaluations completed and patient was admitted for a comprehensive rehab program. Patient transferred to [facility] on 11/09/2019 .   Patient currently requires total with mobility secondary to muscle weakness, decreased cardiorespiratoy endurance, impaired timing and sequencing, motor apraxia, decreased coordination and decreased motor planning, decreased visual perceptual skills and decreased visual motor skills, decreased midline orientation, decreased attention to left, decreased motor planning and ideational apraxia, decreased initiation, decreased attention, decreased awareness, decreased problem solving, decreased safety awareness, decreased memory and delayed processing and decreased sitting balance, decreased standing balance, decreased postural control and decreased balance strategies.  Prior to hospitalization, patient was min with mobility and lived with Spouse in a House home.  Home access is  Stairs to  enter.  Patient will benefit from skilled PT intervention to maximize safe functional mobility, minimize fall risk and decrease caregiver burden for planned discharge home with 24 hour assist.  Anticipate patient will benefit from follow up [facility] at discharge.  PT - End of Session Activity Tolerance: Tolerates 30+ min activity with multiple rests Endurance Deficit: Yes Endurance Deficit Description: Very limited standing endurance; needing frequent RBs throughout; pt requested back to bed at end of session due to fatigue PT Assessment Rehab Potential (ACUTE/IP ONLY): Good PT Barriers to Discharge: [place] home environment;Decreased caregiver support;Incontinence;Behavior PT Barriers to Discharge Comments: Patient with significant cognitive deficits and anxiety, generalized deconditioing and resistant to mobility due to anxiety putting patient at increased fall risk, patient's husband may be only caregiver at d/c, may have STE home PT Patient demonstrates impairments in the following area(s): Balance;Behavior;Edema;Endurance;Motor;Nutrition;Pain;Perception;Safety;Sensory;Skin Integrity PT Transfers Functional Problem(s): Bed Mobility;Bed to Chair;Car;Furniture PT Locomotion Functional Problem(s): Ambulation;Wheelchair Mobility;Stairs PT Plan PT Intensity: Minimum of 1-2 x/day ,45 to 90 minutes PT Frequency: 5 out of 7 days PT Duration Estimated Length of Stay: 2-3 weeks PT Treatment/Interventions: Ambulation/gait training;Cognitive remediation/compensation;Discharge planning;DME/adaptive equipment instruction;Functional mobility training;Pain management;Psychosocial support;Splinting/orthotics;Therapeutic Activities;UE/LE Strength taining/ROM;Visual/perceptual remediation/compensation;Balance/vestibular training;Community reintegration;Disease management/prevention;Functional electrical stimulation;Neuromuscular re-education;Patient/family education;Skin care/wound management;Stair  training;Therapeutic Exercise;Wheelchair propulsion/positioning;UE/LE Coordination activities PT Transfers Anticipated Outcome(s): mod A using LRAD PT Locomotion Anticipated Outcome(s): n/a PT Recommendation Recommendations for Other Services: Neuropsych consult Follow Up Recommendations: Home health PT Patient destination: Home (may need SNF placement at d/c depending on family support at home) Equipment Recommended: To be determined Equipment Details: Will provide DME recs based on patient's progress with mobility   PT Evaluation Precautions/Restrictions Precautions Precautions: Fall Precaution Comments: anxious with mobility-resists posteriorly; tremors bilateral hands; watch skin integrity bilateral palms due to clenching fists intermittently Restrictions Weight Bearing Restrictions: No General PT Amount of Missed Time (min): 15 Minutes PT Missed Treatment Reason: Patient fatigue Vital Signs  Pain Pain Assessment Pain Scale: Faces Faces Pain Scale: No hurt Home Living/Prior Functioning Home Living Available Help at Discharge: Family;Available 24 hours/day Type of Home: House Home Access: Stairs to enter Home Layout: One level Bathroom Shower/Tub: Chiropodist: Standard Additional Comments: per nursing, patient's husband is very attentive to patient and can provide physical assist at d/c  Lives With: Spouse Prior Function Level of Independence: Needs assistance with homemaking;Needs assistance with tranfers;Needs assistance with ADLs;Needs assistance with gait Comments: PLOF taken from chart review due to pt currently poor historian; will take more detail history from husband when able Vision/Perception  Vision - Assessment Eye Alignment: Within Functional Limits Ocular Range of Motion: Other (comment) (able to track in alldirections except L superior field, no functional tracking out of midline without cues) Alignment/Gaze Preference: Other (comment)  (head and gaze at midline, not cervical ROM from midline with cues or functional movement) Tracking/Visual Pursuits: Decreased smoothness of horizontal tracking;Decreased smoothness of vertical tracking;Decreased smoothness of eye movement to RIGHT superior field;Decreased smoothness of eye movement to LEFT superior field;Decreased smoothness of eye movement to RIGHT inferior field;Left eye does not track laterally;Right eye does not track medially;Decreased smoothness of eye movement to LEFT inferior field Saccades: Undershoots;Decreased speed of saccadic movement Additional Comments: Difficult to assess due to cognitive deficits, no L peripheral vision with gross testing and decreased L attention Perception Perception: Impaired Inattention/Neglect: Does not attend to left visual field Comments: difficult to assess due to impaired cognition; however pt undershooting when attempting to place hand under water and turn faucet on. Praxis Praxis: Impaired Praxis  Impairment Details: Motor planning;Ideation;Initiation;Ideomotor Praxis-Other Comments: Anxiety and delusions likely influencing pt performance  Cognition Overall Cognitive Status: Impaired/Different from baseline Arousal/Alertness: Awake/alert Orientation Level: Oriented to person Attention: Focused;Sustained;Selective Focused Attention: Impaired Focused Attention Impairment: Functional basic Sustained Attention: Impaired Sustained Attention Impairment: Functional basic Selective Attention: Impaired Selective Attention Impairment: Functional basic Memory: Impaired Memory Impairment: Storage deficit;Decreased recall of new information;Decreased short term memory Decreased Short Term Memory: Verbal basic;Functional basic Immediate Memory Recall: Sock;Blue;Bed Memory Recall Sock: Not able to recall Memory Recall Blue: Not able to recall Memory Recall Bed: Not able to recall Awareness: Impaired Awareness Impairment: Emergent  impairment Problem Solving: Impaired Problem Solving Impairment: Functional basic Executive Function: Reasoning;Sequencing;Decision Making;Initiating Reasoning: Impaired Reasoning Impairment: Functional basic Sequencing: Impaired Sequencing Impairment: Functional basic Decision Making: Impaired Decision Making Impairment: Functional basic Initiating: Impaired Initiating Impairment: Functional basic Safety/Judgment: Impaired Sensation Sensation Light Touch: Impaired by gross assessment Hot/Cold: Impaired by gross assessment Proprioception: Impaired by gross assessment Additional Comments: difficult to fully assess due to impaired cognition Coordination Gross Motor Movements are Fluid and Coordinated: No Fine Motor Movements are Fluid and Coordinated: No Coordination and Movement Description: tremors bilateral UE, decreased initiation, resists mobility due to significant anxiety Finger Nose Finger Test: unable to follow commands to complete Heel Shin Test: unable to follow commands to complete Motor  Motor Motor: Motor apraxia;Abnormal postural alignment and control Motor - Skilled Clinical Observations: significant posterior push when changing positions   Trunk/Postural Assessment  Cervical Assessment Cervical Assessment: Exceptions to [facility] (forward head and rounded shoulders, minimal to no cervical rotation AROM with mobility) Thoracic Assessment Thoracic Assessment: Exceptions to [facility] (kyphosis) Lumbar Assessment Lumbar Assessment: Exceptions to [facility] (posterior pelvic tilt) Postural Control Postural Control: Deficits on evaluation (posteriorly pushing)  Balance Balance Balance Assessed: Yes Static Sitting Balance Static Sitting - Level of Assistance: 4: Min assist Dynamic Sitting Balance Dynamic Sitting - Level of Assistance: 2: Max assist Static Standing Balance Static Standing - Level of Assistance: 2: Max assist Dynamic Standing Balance Dynamic Standing - Level of  Assistance: Not tested (comment) Extremity Assessment  RUE Assessment RUE Assessment: Exceptions to [facility] Passive Range of Motion (PROM) Comments: WNL Active Range of Motion (AROM) Comments: WFL General Strength Comments: 4-/5 globally; 3+/5 grip LUE Assessment LUE Assessment: Exceptions to [facility] Passive Range of Motion (PROM) Comments: WNL Active Range of Motion (AROM) Comments: WFL General Strength Comments: globally 4/5; grip 3+/5 RLE Assessment RLE Assessment: Exceptions to [facility] Active Range of Motion (AROM) Comments: Grossly WFL for functional mobility General Strength Comments: Grossly at least 3+/5, unable to follow commands for formal testing due to cognitive deficits and anxiety LLE Assessment LLE Assessment: Exceptions to [facility] Active Range of Motion (AROM) Comments: Grossly WFL for functional mobility General Strength Comments: Grossly at least 3+/5, unable to follow commands for formal testing due to cognitive deficits and anxiety  Care Tool Care Tool Bed Mobility Roll left and right activity   Roll left and right assist level: Maximal Assistance - Patient 25 - 49%    Sit to lying activity   Sit to lying assist level: Total Assistance - Patient < 25%    Lying to sitting edge of bed activity   Lying to sitting edge of bed assist level: Maximal Assistance - Patient 25 - 49%     Care Tool Transfers Sit to stand transfer   Sit to stand assist level: Dependent - Patient 0% (Stedy)    Chair/bed transfer   Chair/bed transfer assist level: Dependent - Patient 0% (  Stedy)     Toilet transfer   Assist Level: Dependent - Patient 0% Customer service manager)    Scientist, product/process development transfer activity did not occur: Safety/medical concerns (limited by anxiety and decreased strength/balance)        Care Tool Locomotion Ambulation Ambulation activity did not occur: Safety/medical concerns (limited by anxiety and decreased strength/balance)        Walk 10 feet activity Walk 10 feet activity did not  occur: Safety/medical concerns       Walk 50 feet with 2 turns activity Walk 50 feet with 2 turns activity did not occur: Safety/medical concerns      Walk 150 feet activity Walk 150 feet activity did not occur: Safety/medical concerns      Walk 10 feet on uneven surfaces activity Walk 10 feet on uneven surfaces activity did not occur: Safety/medical concerns      Stairs Stair activity did not occur: Safety/medical concerns (limited by anxiety and decreased strength/balance)        Walk up/down 1 step activity Walk up/down 1 step or curb (drop down) activity did not occur: Safety/medical concerns     Walk up/down 4 steps activity did not occuR: Safety/medical concerns  Walk up/down 4 steps activity      Walk up/down 12 steps activity Walk up/down 12 steps activity did not occur: Safety/medical concerns      Pick up small objects from floor Pick up small object from the floor (from standing position) activity did not occur: Safety/medical concerns      Wheelchair     Wheelchair activity did not occur: Safety/medical concerns (limited by decreased cognition and anxiety)      Wheel 50 feet with 2 turns activity Wheelchair 50 feet with 2 turns activity did not occur: Safety/medical concerns    Wheel 150 feet activity Wheelchair 150 feet activity did not occur: Safety/medical concerns      Refer to Care Plan for Long Term Goals  SHORT TERM GOAL WEEK 1 PT Short Term Goal 1 (Week 1): Patient will perform bed mobility with max A of 1 person consistently. PT Short Term Goal 2 (Week 1): Patient will perform dynamic sitting balance with mod A. PT Short Term Goal 3 (Week 1): Patient will perform transfer with max A of 1 person.  Recommendations for other services: Neuropsych  Skilled Therapeutic Intervention In addition to the PT evaluation above, the patient performed the following skilled PT interventions: Patient in bed with RN in the room upon PT arrival. Patient alert and  initially stating that she would like everyone to leave the room. Noted B UE tremors and hypophonic speech. Provided explanation for PT evaluation and goals to assist patient to go home. Patient then calm and agreeable to PT session. Patient denied pain during session.  Patient was oriented only to self throughout session despite reorientation provided several times during session. She perseverated on calling out for her husband or asking where her husband was despite PT informing her that her husband was at home.   Therapeutic Activity: Bed Mobility: Patient performed rolling R/L with max A, limited by patient resisting mobility. She performed supine to/from sit with max-total A for trunk and LE managment. Initially patientin posterior lean in sitting requiring max A for sitting balance. Scooted patient forward with total A x2, once B feet were on the floor and patient provided gradual reduced assist, patient able to self correct lean and sit EOB with close supervision. She performed sit to supine with total A  for trunk and LE support due to fear of movement and decreased initiation.  Transfers: Patient performed sit to/from stand x1 with max-total A, patient unable to extend hips and trunk to come to full stand due to increased anxiety and returned to sitting soon after initiation. Attempted squat pivot transfer, patient actively resisted mobility and returned to sitting EOB. Patient then stated she needed to void. Performed toilet transfer to [facility] using Stedy with +2 assist for safety and improved patient comfort with mobility. Required HOH assist for foot and hand placement on Stedy the first trial, able to place herself on transfer back to bed. Required 1-2-3 count to initiate standing and mod A both trials to come to stand. Provided multi-modal cues for hip and trunk extension in standing. Performed standing balance in [place] with min A-CGA ~1 min during peri-care. Patient was unsuccessful to void on [facility].    Patient requesting to get back to bed after using BSC and declined further mobility in w/c this session. Patient requested to void again, tried a second time using the female urinal. Patient continued to be unsuccessful to void, suspect due to anxiety of having PT in the room. Patient able to recall use of Pure-wick on acute and requested to have this to void. PT educated about goals for functional voiding in rehab and ecouraged the patient to attempt the urinal, bed pan, or BSC with assist. Patient declined all devices and further mobility at this time and requested to rest. Patient missed 15 min of skilled PT due to fatigue, RN made aware. Will attempt to make-up missed time as able.    Patient in bed at end of session with breaks locked, bed alarm set, and all needs within reach.   Instructed pt in results of PT evaluation as detailed above, PT POC, rehab potential, rehab goals, and discharge recommendations. Additionally discussed [facility]'s policies regarding fall safety and use of chair alarm and/or quick release belt. Pt verbalized understanding and in agreement. Will update pt's family members as they become available.    Discharge Criteria: Patient will be discharged from PT if patient refuses treatment 3 consecutive times without medical reason, if treatment goals not met, if there is a change in medical status, if patient makes no progress towards goals or if patient is discharged from hospital.  The above assessment, treatment plan, treatment alternatives and goals were discussed and mutually agreed upon: by patient  Doreene Burke PT, DPT  11/10/2019, 4:03 PM

## 2019-11-10 NOTE — Evaluation (Signed)
Speech Language Pathology Assessment and Plan  Patient Details  Name: Hannah Padilla MRN: [id number] Date of Birth: 02/21/1950  SLP Diagnosis: Cognitive Impairments;Speech and Language deficits;Dysphagia  Rehab Potential: Good ELOS: 2-3 weeks    Today's Date: 11/10/2019 SLP Individual Time: 0903-1000 SLP Individual Time Calculation (min): 57 min   Hospital Problem: Principal Problem:   Progressive dementia with uncertain etiology ([place]) Active Problems:   Acute metabolic encephalopathy   Anxiety disorder due to general medical condition  Past Medical History:  Past Medical History:  Diagnosis Date  . Anxiety   . Colon polyp 2015  . Elevated serum free T4 level    husband report  . Farsightedness    wears glasses  . Mood change 2021   major change in affect, health issues with multiple ED visits, hospitalization, starting 07/10/2019  . RMSF [facility] spotted fever) 2021  . Urinary tract infection    Past Surgical History:  Past Surgical History:  Procedure Laterality Date  . BREAST REDUCTION SURGERY    . CARPAL TUNNEL RELEASE     right  . COLONOSCOPY  [address]   [place], [place]  . lichenoid keratosis  09/05/11   Skin, left anterior shouldee  . PARTIAL HYSTERECTOMY     still has ovaries  . WISDOM TOOTH EXTRACTION      Assessment / Plan / Recommendation Clinical Impression [name] is a 69 year old right-handed female with history of hyperthyroidism maintained on Tapazole, recurrent UTIs, [place] spotted fever. Presented 10/20/2019 with progressive decline with noted apraxia over the last 3 months as well as intermittent panic attacks generalized weakness and falls. Prior to onset of symptoms patient was healthy able to complete all of her ADLs without assistance. Most recent MRI showed mild generalized cortical atrophy stable compared to scan of 08/11/2019. Scattered T2 FLAIR hyperintense foci predominantly in the subcortical and deep white matter of the  hemispheres most consistent with chronic microvascular ischemic change. No acute findings. EEG consistent with mild to moderate diffuse encephalopathy. No seizure. CT of the chest abdomen pelvis showed no evidence of primary metastatic disease. Admission chemistries potassium 3.4 total protein 6.4 AST 49 ALT 50 hemoglobin 14.7 SARS coronavirus negative, TSH 0.851, free T4 1.96, T3 total 73, T4 total 10.5 ammonia level 17, ESR of 2, paraneoplastic CSF study negative. Neurology follow-up suspect progressive dementia of unknown etiology. She did receive 5 days of IV steroids during her hospital course as well as initiation of Sinemet for trial. She is on a mechanical soft diet thin liquids. Subcutaneous Lovenox for DVT prophylaxis. Thyroid function studies were reviewed by endocrinology Dr. Buddy Duty who recommends follow-up outpatient with ambulatory referral with recommendations to continue Tapazole as well as propranolol. Patient transferred to CIR on 11/09/2019 .    Pt presents with severe cognitive impairments including deficits in focused/sustained attention, language of confusion, short term recall, orientation, following 1 step commands, basic problem solving and awareness. The impairments mentioned above further impacted by anxiety and baseline deficits of dementia, however no family present to confirm baseline abilities. Formal cognitive linguistic assessment SLUMS supported severe impairment with a score of 4 out 30 (n=>27.) Pt was orientated to self and some aspects of situation, followed 1 step commands with 50% accuracy, sustained attention in 1-3 minute intervals with max A verbal cues and vague intellectual awareness. Pt requested to use the bathroom x2, pt was unable to void and required continuous cuing to remind her she was on the bedpan. Pt visual gaze preference was at  midline with ability to scan left and right on two occasions given max A verbal cues. Pt presents with low vocal intensity  and at times unintelligible mumbled speech with 60% intelligibility at the sentence level. Pt was not able to increase volume of voice given max multimodal cues, appearing to be cognitive based verse due to reduced respiratory support. Pt was only agreeable to limited PO intake trials of dys 3 textures and thin via straw. Oral motor appeared [facility] except for reduced ability to follow commands. Pt demonstrated slight prolonged mastication, oral clearance, swallow appeared timely and no overt s/s aspiration, however trials were very limited. SLP recommends full supervision due to cognition and assist with feeding. SLP may need to adjust goals after confirming baseline abilities with pt's husband. Pt would benefit from skilled ST services in order to maximize functional independence and reduce burden of care, requiring supervision at discharge with continued skilled ST services.   Skilled Therapeutic Interventions          Skilled ST services focused on cognitive skills. SLP facilitated administration of cognitive linguistic formal assessment and provided education of results. SLP and pt collaborated to set goals for cognitive linguistic needs during length of stay. SLP placed sign on call bell "help" pt demonstrated ability to recall word, however required total A to recall or utilize soft touch bell. SLP also posted signs in room for orientation of place, pt demonstrated ability to read signs and used them with max A verbal cues 30% accuracy to respond to place orientation questions. Pt was left in room with call bell within reach and chair alarm set. SLP recommends to continue skilled services.  SLP Assessment  Patient will need skilled Speech Lanaguage Pathology Services during CIR admission    Recommendations  SLP Diet Recommendations: Dysphagia 3 (Mech soft);Thin Liquid Administration via: Cup;Spoon Medication Administration: Other (Comment) Supervision: Full supervision/cueing for compensatory  strategies;Staff to assist with self feeding Compensations: Minimize environmental distractions;Slow rate;Small sips/bites;Follow solids with liquid Postural Changes and/or Swallow Maneuvers: Seated upright 90 degrees;Upright 30-60 min after meal Oral Care Recommendations: Oral care BID Patient destination: Home Follow up Recommendations: Home Health SLP;24 hour supervision/assistance Equipment Recommended: None recommended by SLP    SLP Frequency 3 to 5 out of 7 days   SLP Duration  SLP Intensity  SLP Treatment/Interventions 2-3 weeks  Minumum of 1-2 x/day, 30 to 90 minutes  Cueing hierarchy;Dysphagia/aspiration precaution training;Functional tasks;Cognitive remediation/compensation;Environmental controls;Internal/external aids;Patient/family education;Speech/Language facilitation    Pain Pain Assessment Pain Scale: Faces Pain Score: 0-No pain Faces Pain Scale: No hurt  Prior Functioning Cognitive/Linguistic Baseline: Baseline deficits Baseline deficit details: husband not present to confirm but baseline dementia per chart review Type of Home: House  Lives With: Spouse Available Help at Discharge: Family;Available 24 hours/day  SLP Evaluation Cognition Overall Cognitive Status: Impaired/Different from baseline Arousal/Alertness: Awake/alert Orientation Level: Oriented to person Attention: Focused;Sustained;Selective Focused Attention: Impaired Focused Attention Impairment: Functional basic Sustained Attention: Impaired Sustained Attention Impairment: Functional basic Selective Attention: Impaired Selective Attention Impairment: Functional basic Memory: Impaired Memory Impairment: Storage deficit;Decreased recall of new information;Decreased short term memory Decreased Short Term Memory: Verbal basic;Functional basic Immediate Memory Recall: Sock;Blue;Bed Memory Recall Sock: Not able to recall Memory Recall Blue: Not able to recall Memory Recall Bed: Not able to  recall Awareness: Impaired Awareness Impairment: Emergent impairment Problem Solving: Impaired Problem Solving Impairment: Functional basic Executive Function: Reasoning;Sequencing;Decision Making;Initiating Reasoning: Impaired Reasoning Impairment: Functional basic Sequencing: Impaired Sequencing Impairment: Functional basic Decision Making: Impaired Decision Making Impairment: Functional  basic Initiating: Impaired Initiating Impairment: Functional basic Safety/Judgment: Impaired  Comprehension Auditory Comprehension Overall Auditory Comprehension: Impaired Yes/No Questions: Within Functional Limits Commands: Impaired One Step Basic Commands: 50-74% accurate Conversation: Simple Interfering Components: Anxiety Visual Recognition/Discrimination Discrimination: Not tested Reading Comprehension Reading Status: Within funtional limits Expression Expression Primary Mode of Expression: Verbal Verbal Expression Overall Verbal Expression: Impaired (difficulity producing coherent thoughts) Initiation: No impairment Automatic Speech: Name;Social Response Level of Generative/Spontaneous Verbalization: Phrase;Sentence Repetition: No impairment Naming: Impairment Interfering Components: Speech intelligibility;Attention Written Expression Dominant Hand: Right Oral Motor Oral Motor/Sensory Function Overall Oral Motor/Sensory Function: Mild impairment Facial ROM: Reduced right;Reduced left Facial Symmetry: Within Functional Limits Facial Strength: Within Functional Limits Lingual ROM: Reduced right;Reduced left Lingual Symmetry: Within Functional Limits Lingual Strength: Reduced Velum: Within Functional Limits Mandible: Within Functional Limits Motor Speech Overall Motor Speech: Impaired Respiration: Impaired Level of Impairment: Sentence Phonation: Low vocal intensity Resonance: Within functional limits Articulation: Within functional limitis Intelligibility:  Intelligibility reduced Word: 75-100% accurate Phrase: 75-100% accurate Sentence: 50-74% accurate Motor Planning: Not tested Motor Speech Errors: Not applicable  Care Tool Care Tool Cognition Expression of Ideas and Wants Expression of Ideas and Wants: Frequent difficulty - frequently exhibits difficulty with expressing needs and ideas   Understanding Verbal and Non-Verbal Content Understanding Verbal and Non-Verbal Content: Sometimes understands - understands only basic conversations or simple, direct phrases. Frequently requires cues to understand   Memory/Recall Ability *first 3 days only Memory/Recall Ability *first 3 days only: That he or she is in a hospital/hospital unit     PMSV Assessment  PMSV Trial Intelligibility: Intelligibility reduced Word: 75-100% accurate Phrase: 75-100% accurate Sentence: 50-74% accurate  Bedside Swallowing Assessment General Date of Onset: 10/20/19 Previous Swallow Assessment: None Diet Prior to this Study: Dysphagia 3 (soft);Thin liquids Respiratory Status: Room air History of Recent Intubation: No Behavior/Cognition: Alert;Cooperative;Requires cueing;Confused;Agitated Oral Cavity - Dentition: Adequate natural dentition Self-Feeding Abilities: Total assist Baseline Vocal Quality: Low vocal intensity Volitional Cough: Cognitively unable to elicit Volitional Swallow: Unable to elicit  Oral Care Assessment Does patient have any of the following "high(er) risk" factors?: None of the above Does patient have any of the following "at risk" factors?: Other - dysphagia Patient is HIGH RISK: Non-ventilated: Order set for Adult Oral Care Protocol initiated - "High Risk Patients - Non-Ventilated" option selected  (see row information) Patient is AT RISK: Order set for Adult Oral Care Protocol initiated -  "At Risk Patients" option selected (see row information) Patient is LOW RISK: Follow universal precautions (see row information) Ice Chips Ice  chips: Not tested Thin Liquid Thin Liquid: Within functional limits Other Comments: limited trials Nectar Thick Nectar Thick Liquid: Not tested Honey Thick Honey Thick Liquid: Not tested Puree Puree: Not tested Solid Solid: Impaired Oral Phase Impairments: Impaired mastication;Poor awareness of bolus;Reduced lingual movement/coordination Oral Phase Functional Implications: Impaired mastication;Prolonged oral transit Other Comments: limited trials BSE Assessment Risk for Aspiration Impact on safety and function: Mild aspiration risk;Risk for inadequate nutrition/hydration Other Related Risk Factors: Deconditioning;Cognitive impairment;Lethargy  Short Term Goals: Week 1: SLP Short Term Goal 1 (Week 1): Pt will demonstrate basic problem solving skills by using call bell to request assistance with mod A multimodal cues. SLP Short Term Goal 2 (Week 1): Pt will demonstrate sustained attention in 3-5 minute intervals with max A verbal cues. SLP Short Term Goal 3 (Week 1): Pt will express wants/needs coherently with mod A verbal cues. SLP Short Term Goal 4 (Week 1): Pt will follow 1 step commands with 60%  accuracy with max A multimodal cues. SLP Short Term Goal 5 (Week 1): Pt will increase vocal intensity to 70% intelligibility at the sentence level with max A verbal cues. SLP Short Term Goal 6 (Week 1): Pt will consume dys 3 textures and thin liquid diet with minimal overt s/s aspiration and mod A verbal cues for swallow strategies.  Refer to Care Plan for Long Term Goals  Recommendations for other services: None   Discharge Criteria: Patient will be discharged from SLP if patient refuses treatment 3 consecutive times without medical reason, if treatment goals not met, if there is a change in medical status, if patient makes no progress towards goals or if patient is discharged from hospital.  The above assessment, treatment plan, treatment alternatives and goals were discussed and  mutually agreed upon: by patient  Ronniesha Seibold  [facility] ([place]) 11/10/2019, 4:27 PM

## 2019-11-10 NOTE — Progress Notes (Signed)
This nurse went in to recheck pt BP at shift change and obtained 166/142 from the left arm. Engineer, manufacturing notified. Assessment completed. Pt denies dizziness, numbness/tingling. Pt is alert and orientation and speech are at baseline. No new findings. Rechecked pt BP, resolved at 94/58 manually in the right arm. Husband in room. Will continue to monitor. Sheela Stack, LPN

## 2019-11-10 NOTE — Patient Care Conference (Signed)
Inpatient RehabilitationTeam Conference and Plan of Care Update Date: 11/10/2019   Time: 10:15 AM    Patient Name: Hannah Padilla      Medical Record Number: [id number]  Date of Birth: 1950/02/20 Sex: Female         Room/Bed: 4W03C/4W03C-01 Payor Info: Payor: [employer] MEDICARE / Plan: [employer] MEDICARE / Product Type: *No Product type* /    Admit Date/Time:  11/09/2019  4:43 PM  Primary Diagnosis:  Progressive dementia with uncertain etiology [facility])  Hospital Problems: Principal Problem:   Progressive dementia with uncertain etiology ([place]) Active Problems:   Acute metabolic encephalopathy   Anxiety disorder due to general medical condition    Expected Discharge Date: Expected Discharge Date:  (2-3 weeks)  Team Members Present: Physician leading conference: Dr. Alger Simons Care Coodinator Present: Dorthula Nettles, RN, BSN, CRRN;Loralee Pacas, [place] Nurse Present: Dwaine Gale, RN;Other (comment) Annita Brod, LPN) PT Present: Apolinar Junes, PT OT Present: Laverle Hobby, OT SLP Present: Weston Anna, SLP PPS Coordinator present : Ileana Ladd, Burna Mortimer, SLP     Current Status/Progress Goal Weekly Team Focus  Bowel/Bladder   Patient Incontinent of bowel and bladder LBM 11/08/2019  Reduce incidence of incontinence episodes  Q2 hour toileting and PRN   Swallow/Nutrition/ Hydration             ADL's   eval pending         Mobility             Communication             Safety/Cognition/ Behavioral Observations            Pain   Patient denies pain this shift on assessment  maintain a pain goal of less than 3  Assess pain Qshift and PRN   Skin   Skin intact except for bruising from lovenox injections on the abdomen  no new breakdown  Assess skin Qshift and PRN     Discharge Planning:      Team Discussion: Urinary urgency, Continent B/B. Evals pending. MD would like her to see Dr. Sima Matas ASAP. Patient has progressive dementia. Patient on  target to meet rehab goals: Evaluations Pending  *See Care Plan and progress notes for long and short-term goals.   Revisions to Treatment Plan:  Not at this time.  Teaching Needs: Begin family education when appropriate.  Current Barriers to Discharge: Nutritional means and anxiety.  Possible Resolutions to Barriers: Nutritional assessments and anxiety management.     Medical Summary Current Status: progesive dementia with anxiety of unknown etiology. poor po intake. parkinson features  Barriers to Discharge: Medical stability   Possible Resolutions to Barriers/Weekly Focus: daily lab and VS review, nutritional assessments. anxiety mgt   Continued Need for Acute Rehabilitation Level of Care: The patient requires daily medical management by a physician with specialized training in physical medicine and rehabilitation for the following reasons: Direction of a multidisciplinary physical rehabilitation program to maximize functional independence : Yes Medical management of patient stability for increased activity during participation in an intensive rehabilitation regime.: Yes Analysis of laboratory values and/or radiology reports with any subsequent need for medication adjustment and/or medical intervention. : Yes   I attest that I was present, lead the team conference, and concur with the assessment and plan of the team.   Dorthula Nettles G 11/10/2019, 1:50 PM

## 2019-11-10 NOTE — Progress Notes (Signed)
[place] PHYSICAL MEDICINE & REHABILITATION PROGRESS NOTE   Subjective/Complaints: Had a reasonable night per nurse. Anxious that husband left to take care of dogs. Worried about the dog, too. Admits that anxiety can cloud her judgement  ROS: Patient denies fever, rash, sore throat, blurred vision, nausea, vomiting, diarrhea, cough, shortness of breath or chest pain, joint or back pain, headache    Objective:   No results found. Recent Labs    11/09/19 1706 11/10/19 1018  WBC 6.2 6.6  HGB 13.3 13.7  HCT 40.0 40.2  PLT 199 218   Recent Labs    11/09/19 1706 11/10/19 1018  NA  --  140  K  --  3.8  CL  --  109  CO2  --  20*  GLUCOSE  --  109*  BUN  --  9  CREATININE 0.70 0.72  CALCIUM  --  9.2    Intake/Output Summary (Last 24 hours) at 11/10/2019 1133 Last data filed at 11/10/2019 0907 Gross per 24 hour  Intake 80 ml  Output 150 ml  Net -70 ml        Physical Exam: Vital Signs Blood pressure 118/68, pulse 73, temperature 98.7 F (37.1 C), temperature source Oral, resp. rate 19, SpO2 97 %.  General: Alert, tucked under covers, frail appearing HEENT: Head is normocephalic, atraumatic, PERRLA, EOMI, sclera anicteric, oral mucosa pink and moist, dentition intact, ext ear canals clear,  Neck: Supple without JVD or lymphadenopathy Heart: Reg rate and rhythm. No murmurs rubs or gallops Chest: CTA bilaterally without wheezes, rales, or rhonchi; sometime hyperventilates Abdomen: Soft, non-tender, non-distended, bowel sounds positive. Extremities: No clubbing, cyanosis, or edema. Pulses are 2+ Skin: Clean and intact without signs of breakdown Neuro: pt oriented to person, place, reason to an extent. Is easily distracted. Right upper ext with pill rolling tremor. Left hand is clenched. Can loosen grip when cued.  Musculoskeletal: Full ROM, No pain with AROM or PROM in the neck, trunk, or extremities. Posture appropriate Psych: very anxious      Assessment/Plan: 1.  Functional deficits secondary to acute, progressive dementia which require 3+ hours per day of interdisciplinary therapy in a comprehensive inpatient rehab setting.  Physiatrist is providing close team supervision and 24 hour management of active medical problems listed below.  Physiatrist and rehab team continue to assess barriers to discharge/monitor patient progress toward functional and medical goals  Care Tool:  Bathing        Body parts bathed by helper: Front perineal area, Buttocks     Bathing assist Assist Level: Maximal Assistance - Patient 24 - 49%     Upper Body Dressing/Undressing Upper body dressing   What is the patient wearing?: Hospital gown only    Upper body assist      Lower Body Dressing/Undressing Lower body dressing      What is the patient wearing?: Underwear/pull up     Lower body assist Assist for lower body dressing: Maximal Assistance - Patient 25 - 49%     Toileting Toileting    Toileting assist       Transfers Chair/bed transfer  Transfers assist           Locomotion Ambulation   Ambulation assist              Walk 10 feet activity   Assist           Walk 50 feet activity   Assist  Walk 150 feet activity   Assist           Walk 10 feet on uneven surface  activity   Assist           Wheelchair     Assist               Wheelchair 50 feet with 2 turns activity    Assist            Wheelchair 150 feet activity     Assist          Blood pressure 118/68, pulse 73, temperature 98.7 F (37.1 C), temperature source Oral, resp. rate 19, SpO2 97 %.  Medical Problem List and Plan: 1.Progressive decrease in functional mobility, impaired cognition, apraxia with panic attacks generalized weakness and fallssecondary to acute metabolic encephalopathy/progressive dementiaof unknown etiology. -reviewed lab work up as ordered by acute, all  negative -Continue Sinemet trial 25-100 mg 3 times daily -patient may shower -ELOS/Goals: 14-16 days, min assist goals with PT, OT, SLP 2. Antithrombotics: -DVT/anticoagulation:Lovenox -antiplatelet therapy: N/A 3. Pain Management:Tylenolas needed 4. Mood:Zoloft 50 mg daily, Valium12.5mg  q12 prn although it appears that she has been using more often than not at home. -will ask neuropsychology to see as well -pt sees ?psychiatry as outpt -antipsychotic agents: N/A 5. Neuropsych: This patientis notcapable of making decisions on herown behalf. 6. Skin/Wound Care:Routine skin checks 7. Fluids/Electrolytes/Nutrition:encourage po  -I personally reviewed the patient's labs today.    -husband says she was struggling with appetite at home and was on megace. Will resume megace while here  -add supplemental shakes 8. Hyper thyroidism. Continue Tapazole 5 mg 3 times daily as well as Inderal 60 mg daily. Patient is to see Dr. Buddy Duty of endocrinology outpatient 9. Elevated LFT's-  -290/264/50/2.1  -?related to tapazole, but was on this before admit. Will d/w pharmacy  -recheck LFT's 10/6    LOS: 1 days A FACE TO [place] 11/10/2019, 11:33 AM

## 2019-11-10 NOTE — Evaluation (Signed)
Occupational Therapy Assessment and Plan  Patient Details  Name: Hannah Padilla MRN: [id number] Date of Birth: Oct 17, 1950  OT Diagnosis: abnormal posture, apraxia, cognitive deficits, disturbance of vision and muscle weakness (generalized) Rehab Potential: Rehab Potential (ACUTE ONLY): Good ELOS: 2-3 weeks   Today's Date: 11/10/2019 OT Individual Time: 1100-1158 OT Individual Time Calculation (min): 58 min     Hospital Problem: Principal Problem:   Progressive dementia with uncertain etiology ([place]) Active Problems:   Acute metabolic encephalopathy   Anxiety disorder due to general medical condition   Past Medical History:  Past Medical History:  Diagnosis Date  . Anxiety   . Colon polyp 2015  . Elevated serum free T4 level    husband report  . Farsightedness    wears glasses  . Mood change 2021   major change in affect, health issues with multiple ED visits, hospitalization, starting 07/10/2019  . RMSF [facility] spotted fever) 2021  . Urinary tract infection    Past Surgical History:  Past Surgical History:  Procedure Laterality Date  . BREAST REDUCTION SURGERY    . CARPAL TUNNEL RELEASE     right  . COLONOSCOPY  4098   [place], [place]  . lichenoid keratosis  09/05/11   Skin, left anterior shouldee  . PARTIAL HYSTERECTOMY     still has ovaries  . WISDOM TOOTH EXTRACTION      Assessment & Plan Clinical Impression: Hannah Padilla is a 69 year old right-handed female with history of hyperthyroidism maintained on Tapazole, recurrent UTIs, [place] spotted fever. Presented 10/20/2019 with progressive decline with noted apraxia over the last 3 months as well as intermittent panic attacks generalized weakness and falls. Prior to onset of symptoms patient was healthy able to complete all of her ADLs without assistance. Most recent MRI showed mild generalized cortical atrophy stable compared to scan of 08/11/2019. Scattered T2 FLAIR hyperintense foci predominantly in the  subcortical and deep white matter of the hemispheres most consistent with chronic microvascular ischemic change. No acute findings. EEG consistent with mild to moderate diffuse encephalopathy. No seizure. CT of the chest abdomen pelvis showed no evidence of primary metastatic disease. Admission chemistries potassium 3.4 total protein 6.4 AST 49 ALT 50 hemoglobin 14.7 SARS coronavirus negative, TSH 0.851, free T4 1.96, T3 total 73, T4 total 10.5 ammonia level 17, ESR of 2, paraneoplastic CSF study negative. Neurology follow-up suspect progressive dementia of unknown etiology. She did receive 5 days of IV steroids during her hospital course as well as initiation of Sinemet for trial. She is on a mechanical soft diet thin liquids. Subcutaneous Lovenox for DVT prophylaxis. Thyroid function studies were reviewed by endocrinology Dr. Buddy Duty who recommends follow-up outpatient with ambulatory referral with recommendations to continue Tapazole as well as propranolol. Patient transferred to CIR on 11/09/2019 .    Patient currently requires total with basic self-care skills secondary to muscle weakness, decreased cardiorespiratoy endurance, impaired timing and sequencing, motor apraxia, decreased coordination and decreased motor planning, decreased visual motor skills, decreased motor planning and ideational apraxia, decreased initiation, decreased attention, decreased awareness, decreased problem solving, decreased safety awareness, decreased memory and delayed processing and decreased sitting balance, decreased standing balance, decreased postural control and decreased balance strategies.  Prior to hospitalization, patient could complete ADLs with min.  Patient will benefit from skilled intervention to increase independence with basic self-care skills prior to discharge home with care partner.  Anticipate patient will require 24 hour supervision and moderate physical assestance and follow up to be  determined..  OT - End of Session Activity Tolerance: Tolerates 10 - 20 min activity with multiple rests Endurance Deficit: Yes Endurance Deficit Description: Very limited standing endurance; needing frequent RBs throughout; pt requested back to bed at end of session due to fatigue OT Assessment Rehab Potential (ACUTE ONLY): Good OT Patient demonstrates impairments in the following area(s): Balance;Perception;Behavior;Safety;Cognition;Sensory;Endurance;Skin Integrity;Vision;Motor OT Basic ADL's Functional Problem(s): Grooming;Eating;Bathing;Dressing;Toileting OT Transfers Functional Problem(s): Toilet;Tub/Shower OT Additional Impairment(s): Fuctional Use of Upper Extremity OT Plan OT Intensity: Minimum of 1-2 x/day, 45 to 90 minutes OT Frequency: 5 out of 7 days OT Duration/Estimated Length of Stay: 2-3 weeks OT Treatment/Interventions: Balance/vestibular training;Discharge planning;Self Care/advanced ADL retraining;Therapeutic Activities;UE/LE Coordination activities;Cognitive remediation/compensation;Disease mangement/prevention;Functional mobility training;Patient/family education;Therapeutic Exercise;Visual/perceptual remediation/compensation;DME/adaptive equipment instruction;Neuromuscular re-education;Psychosocial support;UE/LE Strength taining/ROM;Splinting/orthotics;Wheelchair propulsion/positioning OT Self Feeding Anticipated Outcome(s): mod assist OT Basic Self-Care Anticipated Outcome(s): mod assist OT Toileting Anticipated Outcome(s): mod assist OT Bathroom Transfers Anticipated Outcome(s): mod assist OT Recommendation Recommendations for Other Services: Neuropsych consult Patient destination: Home Follow Up Recommendations: 24 hour supervision/assistance Equipment Recommended: To be determined   OT Evaluation Precautions/Restrictions  Precautions Precautions: Fall Precaution Comments: anxious with mobility-resists posteriorly; tremors bilateral hands; watch skin integrity  bilateral palms due to clenching fists intermittently Restrictions Weight Bearing Restrictions: No General Chart Reviewed: Yes Pain Pain Assessment Pain Scale: Faces Faces Pain Scale: No hurt Home Living/Prior Functioning Home Living Family/patient expects to be discharged to:: Private residence Living Arrangements: Spouse/significant other Available Help at Discharge: Family, Available 24 hours/day Type of Home: House Home Access: Stairs to enter Home Layout: One level Bathroom Shower/Tub: Chiropodist: Standard Additional Comments: per nursing, patient's husband is very attentive to patient and can provide physical assist at d/c  Lives With: Spouse Prior Function Level of Independence: Needs assistance with homemaking, Needs assistance with tranfers, Needs assistance with ADLs, Needs assistance with gait Comments: PLOF taken from chart review due to pt currently poor historian; will take more detail history from husband when able Vision Baseline Vision/History: Wears glasses Wears Glasses: At all times Patient Visual Report: Other (comment) (Able to read words in front of her at midline, but states "I cannot see" itermittently throughout session) Vision Assessment?: Yes Eye Alignment: Within Functional Limits Ocular Range of Motion: Other (comment) (able to track in alldirections except L superior field, no functional tracking out of midline without cues) Alignment/Gaze Preference: Other (comment) (head and gaze at midline, not cervical ROM from midline with cues or functional movement) Tracking/Visual Pursuits: Decreased smoothness of horizontal tracking;Decreased smoothness of vertical tracking;Decreased smoothness of eye movement to RIGHT superior field;Decreased smoothness of eye movement to LEFT superior field;Decreased smoothness of eye movement to RIGHT inferior field;Left eye does not track laterally;Right eye does not track medially;Decreased smoothness  of eye movement to LEFT inferior field Saccades: Undershoots;Decreased speed of saccadic movement Additional Comments: Difficult to assess due to cognitive deficits, no L peripheral vision with gross testing and decreased L attention Perception  Perception: Impaired Inattention/Neglect: Does not attend to left visual field Comments: difficult to assess due to impaired cognition; however pt undershooting when attempting to place hand under water and turn faucet on. Praxis Praxis: Impaired Praxis Impairment Details: Motor planning;Ideation;Initiation;Ideomotor Praxis-Other Comments: Anxiety and delusions likely influencing pt performance Cognition Overall Cognitive Status: Impaired/Different from baseline Arousal/Alertness: Awake/alert Orientation Level: Person Year: Other (Comment) (2003) Month: March Day of Week: Incorrect Memory: Impaired Memory Impairment: Storage deficit;Decreased recall of new information;Decreased short term memory Decreased Short Term Memory: Verbal basic;Functional basic Immediate Memory Recall: Sock;Blue;Bed Memory Recall  Sock: Not able to recall Memory Recall Blue: Not able to recall Memory Recall Bed: Not able to recall Attention: Focused;Sustained;Selective Focused Attention: Impaired Focused Attention Impairment: Functional basic Sustained Attention: Impaired Sustained Attention Impairment: Functional basic Selective Attention: Impaired Selective Attention Impairment: Functional basic Awareness: Impaired Awareness Impairment: Emergent impairment Problem Solving: Impaired Problem Solving Impairment: Functional basic Executive Function: Reasoning;Sequencing;Decision Making;Initiating Reasoning: Impaired Reasoning Impairment: Functional basic Sequencing: Impaired Sequencing Impairment: Functional basic Decision Making: Impaired Decision Making Impairment: Functional basic Initiating: Impaired Initiating Impairment: Functional  basic Safety/Judgment: Impaired Sensation Sensation Light Touch: Impaired by gross assessment Hot/Cold: Impaired by gross assessment Proprioception: Impaired by gross assessment Additional Comments: difficult to fully assess due to impaired cognition Coordination Gross Motor Movements are Fluid and Coordinated: No Fine Motor Movements are Fluid and Coordinated: No Coordination and Movement Description: tremors bilateral UE, decreased initiation, resists mobility due to significant anxiety Finger Nose Finger Test: unable to follow commands to complete Heel Shin Test: unable to follow commands to complete Motor  Motor Motor: Motor apraxia;Abnormal postural alignment and control Motor - Skilled Clinical Observations: significant posterior push when changing positions  Trunk/Postural Assessment  Cervical Assessment Cervical Assessment: Exceptions to [facility] (forward head and rounded shoulders, minimal to no cervical rotation AROM with mobility) Thoracic Assessment Thoracic Assessment: Exceptions to [facility] (kyphosis) Lumbar Assessment Lumbar Assessment: Exceptions to [facility] (posterior pelvic tilt) Postural Control Postural Control: Deficits on evaluation (posteriorly pushing)  Balance Balance Balance Assessed: Yes Static Sitting Balance Static Sitting - Level of Assistance: 4: Min assist Dynamic Sitting Balance Dynamic Sitting - Level of Assistance: 2: Max assist Static Standing Balance Static Standing - Level of Assistance: 2: Max assist Dynamic Standing Balance Dynamic Standing - Level of Assistance: Not tested (comment) Extremity/Trunk Assessment RUE Assessment RUE Assessment: Exceptions to [facility] Passive Range of Motion (PROM) Comments: WNL Active Range of Motion (AROM) Comments: WFL General Strength Comments: 4-/5 globally; 3+/5 grip LUE Assessment LUE Assessment: Exceptions to [facility] Passive Range of Motion (PROM) Comments: WNL Active Range of Motion (AROM) Comments: WFL General  Strength Comments: globally 4/5; grip 3+/5  Care Tool Care Tool Self Care Eating   Eating Assist Level: Dependent - Patient 0%    Oral Care    Oral Care Assist Level: Total assistance - Patient < 25%    Bathing         Assist Level: Total Assistance - Patient < 25%    Upper Body Dressing(including orthotics)       Assist Level: Maximal Assistance - Patient 25 - 49%    Lower Body Dressing (excluding footwear)     Assist for lower body dressing: Total Assistance - Patient < 25%    Putting on/Taking off footwear     Assist for footwear: Total Assistance - Patient < 25%       Care Tool Toileting Toileting activity   Assist for toileting: Dependent - Patient 0%     Care Tool Bed Mobility Roll left and right activity        Sit to lying activity        Lying to sitting edge of bed activity         Care Tool Transfers Sit to stand transfer        Chair/bed transfer         Toilet transfer   Assist Level: 2 Helpers     Care Tool Cognition Expression of Ideas and Wants Expression of Ideas and Wants: Some difficulty - exhibits some difficulty with expressing needs  and ideas (e.g, some words or finishing thoughts) or speech is not clear   Understanding Verbal and Non-Verbal Content Understanding Verbal and Non-Verbal Content: Usually understands - understands most conversations, but misses some part/intent of message. Requires cues at times to understand   Memory/Recall Ability *first 3 days only Memory/Recall Ability *first 3 days only: That he or she is in a hospital/hospital unit    Refer to Care Plan for [place] 1 OT Short Term Goal 1 (Week 1): Pt will complete UB dressing with mod assist. OT Short Term Goal 2 (Week 1): Pt will complete sit <>stand with mod assist in preperation for LB self care. OT Short Term Goal 3 (Week 1): Pt will complete oral hygiene sitting sinkside with min assist. OT Short Term Goal 4 (Week 1): Pt  will bathe UB with mod assist in seated position.  Recommendations for other services: Neuropsych   Skilled Therapeutic Intervention ADL ADL Grooming: Dependent Where Assessed-Grooming: Sitting at sink Toileting: Dependent Where Assessed-Toileting: Bed level Mobility  Bed Mobility Bed Mobility: Rolling Right;Rolling Left;Sit to Supine;Supine to Sit Rolling Right: Maximal Assistance - Patient 25-49% Rolling Left: Maximal Assistance - Patient 25-49% Supine to Sit: Maximal Assistance - Patient - Patient 25-49% Sit to Supine: Maximal Assistance - Patient 25-49% Transfers Sit to Stand: Maximal Assistance - Patient 25-49% Stand to Sit: Maximal Assistance - Patient 25-49%  Skilled Intervention: Pt supine in bed, no reports or signs of pain currently.  Pt appearing confused, anxious, and disoriented and pt concerned throughout session that a "black man" was coming into her room.  OT frequently and gently reoriented pt to current place and situation, however poor carryover exhibited.  Pt educated on OT scope of practice, collaborated with pt regarding POC, and IE completed.  Functional mobility and self care completed per above levels of assist.  Pt had continent episode of urine at bedpan bed level.  Pt refused bathing and dressing during session and resisting total assist from OT, stating "I just want to get back in bed, watch tv, and then take a nap.  Soft call bell in reach, bed alarm on at end of session.  Discharge Criteria: Patient will be discharged from OT if patient refuses treatment 3 consecutive times without medical reason, if treatment goals not met, if there is a change in medical status, if patient makes no progress towards goals or if patient is discharged from hospital.  The above assessment, treatment plan, treatment alternatives and goals were discussed and mutually agreed upon: by patient  Ezekiel Slocumb 11/10/2019, 3:59 PM

## 2019-11-10 NOTE — Progress Notes (Signed)
Patient Details  Name: Hannah Padilla MRN: [id number] Date of Birth: 26-Jul-1950  Today's Date: 11/10/2019  Hospital Problems: Principal Problem:   Progressive dementia with uncertain etiology [facility]) Active Problems:   Acute metabolic encephalopathy   Anxiety disorder due to general medical condition  Past Medical History:  Past Medical History:  Diagnosis Date  . Anxiety   . Colon polyp 2015  . Elevated serum free T4 level    husband report  . Farsightedness    wears glasses  . Mood change 2021   major change in affect, health issues with multiple ED visits, hospitalization, starting 07/10/2019  . RMSF [facility] spotted fever) 2021  . Urinary tract infection    Past Surgical History:  Past Surgical History:  Procedure Laterality Date  . BREAST REDUCTION SURGERY    . CARPAL TUNNEL RELEASE     right  . COLONOSCOPY  8099   [place], [place]  . lichenoid keratosis  09/05/11   Skin, left anterior shouldee  . PARTIAL HYSTERECTOMY     still has ovaries  . WISDOM TOOTH EXTRACTION     Social History:  reports that she has never smoked. She has never used smokeless tobacco. She reports previous alcohol use of about 5.0 standard drinks of alcohol per week. She reports that she does not use drugs.  Family / Support Systems Marital Status: Married How Long?: 30 years Patient Roles: Spouse Spouse/Significant Other: [name] (husband) [phone] Children: No children Other Supports: N/A Anticipated Caregiver: husband Ability/Limitations of Caregiver: None reported Caregiver Availability: 24/7 Family Dynamics: Pt lives with husband. Unsure on pasttime as pt appeared to be slightly confused.  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 Recent Psychosocial Issues: Pt admits to current anxiety Psychiatric History: Hx of MH per EMR Substance Abuse History: Denies  Patient / Family Perceptions, Expectations & Goals Pt/Family understanding of illness & functional limitations: Pt and pt husband have general understanding of pt care needs Premorbid pt/family roles/activities: some assistance with ADLs/IADLs Anticipated changes in roles/activities/participation: continued assistance needed  [employer]: None Premorbid Home Care/DME Agencies: None Transportation available at discharge: husband Resource referrals recommended: Neuropsychology  Discharge Planning Living Arrangements: Spouse/significant other Support Systems: Spouse/significant other Type of Residence: Private residence Insurance underwriter Resources: Multimedia programmer (specify) ([place]) Financial Resources: Radio broadcast assistant Screen Referred: No Living Expenses: Own Money Management: Patient, Spouse Does the patient have any problems obtaining your medications?: No Home Management: Husband manages all housekeeping and meal prep Patient/Family Preliminary Plans: no changes Care Coordinator Barriers to Discharge: Decreased caregiver support, Lack of/limited family support Care Coordinator Anticipated Follow Up Needs: HH/OP  Clinical Impression SW met with pt in room to introduce self, explain role, and discuss discharge process. Pt has no HCPOA. Pt not a veteran. DME: w/c BSC, and shower chair.   SW called pt husband [name] to introduce self, explain role, and discuss discharge process. He confirms that the  primary caregiver.  SW informed there will be updates after team conference next Tuesday with more information. He states it is best to text  him since he does not have good service in his home.  Annaliyah Willig A Tobie Hellen 11/10/2019, 4:22 PM

## 2019-11-11 ENCOUNTER — Inpatient Hospital Stay (HOSPITAL_COMMUNITY): Payer: Medicare Other

## 2019-11-11 ENCOUNTER — Inpatient Hospital Stay (HOSPITAL_COMMUNITY): Payer: Medicare Other | Admitting: Speech Pathology

## 2019-11-11 LAB — HEPATIC FUNCTION PANEL
ALT: 375 U/L — ABNORMAL HIGH (ref 0–44)
AST: 187 U/L — ABNORMAL HIGH (ref 15–41)
Albumin: 3.2 g/dL — ABNORMAL LOW (ref 3.5–5.0)
Alkaline Phosphatase: 362 U/L — ABNORMAL HIGH (ref 38–126)
Bilirubin, Direct: 0.6 mg/dL — ABNORMAL HIGH (ref 0.0–0.2)
Indirect Bilirubin: 1.3 mg/dL — ABNORMAL HIGH (ref 0.3–0.9)
Total Bilirubin: 1.9 mg/dL — ABNORMAL HIGH (ref 0.3–1.2)
Total Protein: 5.9 g/dL — ABNORMAL LOW (ref 6.5–8.1)

## 2019-11-11 MED ORDER — DIAZEPAM 5 MG PO TABS
2.5000 mg | ORAL_TABLET | Freq: Two times a day (BID) | ORAL | Status: DC
  Administered 2019-11-11 – 2019-11-30 (×39): 2.5 mg via ORAL
  Filled 2019-11-11 (×41): qty 1

## 2019-11-11 MED ORDER — MELATONIN 3 MG PO TABS
3.0000 mg | ORAL_TABLET | Freq: Every day | ORAL | Status: DC
  Administered 2019-11-12 – 2019-12-03 (×22): 3 mg via ORAL
  Filled 2019-11-11 (×23): qty 1

## 2019-11-11 NOTE — Progress Notes (Signed)
Speech Language Pathology Daily Session Note  Patient Details  Name: Hannah Padilla MRN: [id number] Date of Birth: 1950-07-24  Today's Date: 11/11/2019 SLP Individual Time: 0902-0958 SLP Individual Time Calculation (min): 56 min  Short Term Goals: Week 1: SLP Short Term Goal 1 (Week 1): Pt will demonstrate basic problem solving skills by using call bell to request assistance with mod A multimodal cues. SLP Short Term Goal 2 (Week 1): Pt will demonstrate sustained attention in 3-5 minute intervals with max A verbal cues. SLP Short Term Goal 3 (Week 1): Pt will express wants/needs coherently with mod A verbal cues. SLP Short Term Goal 4 (Week 1): Pt will follow 1 step commands with 60% accuracy with max A multimodal cues. SLP Short Term Goal 5 (Week 1): Pt will increase vocal intensity to 70% intelligibility at the sentence level with max A verbal cues. SLP Short Term Goal 6 (Week 1): Pt will consume dys 3 textures and thin liquid diet with minimal overt s/s aspiration and mod A verbal cues for swallow strategies.  Skilled Therapeutic Interventions: Pt was seen for skilled ST targeting cognitive-linguistic goals and education with pt's husband who was present at bedside. Educational efforts focused on reviewing impact of pt's current cognitive function on ADLs and self care tasks, as well as carryover of information. Education also provided regarding current diet recommendations and discussed specific food items in and out of compliance with dysphagia 3 and ability to bring in approved outside food items to encourage PO intake. All questions about pt's status and CIR structure answered to pt's husband's satisfaction. Pt was independently oriented to hospital (although thought she was at [facility]). Multiple choice required for orientation to time, and later Max A to read and locate today on a monthly calendar. Pt required Max A verbal cues for sequencing during a transfer from chair to bed using stead (PT  was also present to provide assistance with safe ambulation). In functional conversation pt recalled personally relevant information related to address, hobbies, and previous employment. Moderate verbal cues required for recall of her pets' names. Pt left resting in bed with call bell at side and husband still present. Continue per current plan of care.            Pain Pain Assessment Pain Scale: Faces Pain Score: 0-No pain Faces Pain Scale: No hurt  Therapy/Group: Individual Therapy  Arbutus Leas 11/11/2019, 7:23 AM

## 2019-11-11 NOTE — Progress Notes (Signed)
Physical Therapy Session Note  Patient Details  Name: Hannah Padilla MRN: [id number] Date of Birth: Aug 22, 1950  Today's Date: 11/11/2019 PT Individual Time: 0800-0900 and 1450-1530 PT Individual Time Calculation (min): 60 min and 40 min  Short Term Goals: Week 1:  PT Short Term Goal 1 (Week 1): Patient will perform bed mobility with max A of 1 person consistently. PT Short Term Goal 2 (Week 1): Patient will perform dynamic sitting balance with mod A. PT Short Term Goal 3 (Week 1): Patient will perform transfer with max A of 1 person.  Skilled Therapeutic Interventions/Progress Updates:     Session 1: Patient in bed with her husband at bedside upon PT arrival. Patient alert and agreeable to PT session. Patient denied pain during session. Continues to demonstrate anxious behaviors, however, much improved with her husband present this morning.   Discussed patient's PLOF, equipment, and daily routine with patient and her husband. Patient's husband answered most questions, patient did contribute a response x1. Per patient's husband, the patient was ambulatory at home with some assist PTA, would walk with her husband at night when she couldn't sleep. Reports that they have access to a lot of DME from a family member that was a quadriplegic.   Patient's husband reported that he has been performing LE ROM with AROM exercises and working on use of UEs with functional tasks, such as holding a water bottle or cup. PT encouraged him to continue with these and continue with a morning routine of ROM exercises prior to therapy sessions in the morning as he has been doing to maintain a routine for the patient.   Also, reported that the patient was very anxious last night about "performing/presenting in front of a crowd" during therapy sessions. Educated patient about one on one therapy sessions to work on functional mobility and will focus on therapy sessions in the room simulating home environment for reduced  anxiety at this time. Patient and her husband agreed and appreciative.  Therapeutic Activity: Bed Mobility: Patient performed supine to/from sit with max A +2 with HOB elevated. Provided verbal cues for initiation and core activation for trunk control to come to sitting. Transfers: Patient performed stand pivot transfers bed<>BSC with max A and CGA from a second person for safety. PT performed transfer to [facility] with patient's husband doffing incontinence brief and her husband performed transfer from [facility] with PT performing peri-care with total A. Patient performed sit to/from stand in the [place] with min-mod A of 2 from bed or w/c and min-mod A of 1 from [place] seat. Provided verbal cues and hand-over-hand assist for UE placement, foot placement, and facilitation for hip and trunk extension in standing. Patient was continent of bowl on [facility], required increased time and encouragement from her husband. Performed NMR for trunk control on BSC, see below.   Neuromuscular Re-ed: Patient performed the following sitting balance and trunk control activities and LE motor control exercises: -sitting balance EOB focused on midline orientation and reduced posterior lean, progressed from max A of 1-2 to close supervision for safety and reduced patient anxiety, maintained sitting balance >5 min -sitting balance and forward trunk flexion seated on BSC, facilitated rocking forward and backwards for improved trunk control and reduced rigidity, also used technique to promote bowl motility and relaxation >5 min -B knee hip flexion x5 progressing from 100% to 10% assist through ROM -B SLR x5 progressing from 100% to 10 % assist and heavy cues for initiation of raising and lowering her leg  Patient's husband participated in hands on training throughout session, provided second person assist throughout. Patient responds well with her husband assisting and presents with decreased anxiety with him there. Her husband demonstrated  good body mechanics and was receptive to education on safe technique to assist throughout. Patient tolerated sitting in w/c ~6 min during session before requesting to return to the bed.   Patient in bed handed off to SLP with her husband in the room at end of session.  Session 2: Patient in bed watching TV upon PT arrival. Patient alert and agreeable to PT session. Patient denied pain during session. Noted decreased UE tremors and patient speaking clearly and calmly with PT throughout session. Patient was oriented to self, place, and situation and asked appropriate questions about her medical status, deficits, and purpose of rehab. Provided answers to patient's questions as able. Patient appeared encouraged about goals for rehab and focus on improving mobility prior to returning home. Patient expressed concerns about her vision, had difficulty describing deficits, but did agree that she does not see things in her L visual field and it seems like things "come out of nowhere" from that side. She also reports feeling like she has things in her hands until she looks at them to see nothing is there. Patient declined OOB mobility, reported that she was the most comfortable she had been all day. Performed 2 sets of LE exercises as above for improved LE motor control and retrieved 16"x16" w/c and cushion for improved OOB sitting tolerance during next session. Patient in bed at end of session with breaks locked, bed alarm set, and all needs within reach.    Therapy Documentation Precautions:  Precautions Precautions: Fall Precaution Comments: anxious with mobility-resists posteriorly; tremors bilateral hands; watch skin integrity bilateral palms due to clenching fists intermittently Restrictions Weight Bearing Restrictions: No   Therapy/Group: Individual Therapy  Ojas Coone L Jaide Hillenburg PT, DPT  11/11/2019, 10:20 AM

## 2019-11-11 NOTE — Progress Notes (Signed)
[place] PHYSICAL MEDICINE & REHABILITATION PROGRESS NOTE   Subjective/Complaints: Pt had a reasonable day. Husband feels that she's gradually improving. Sleep is inconsistent. So is PO intake  ROS: Limited due to cognitive/behavioral    Objective:   No results found. Recent Labs    11/09/19 1706 11/10/19 1018  WBC 6.2 6.6  HGB 13.3 13.7  HCT 40.0 40.2  PLT 199 218   Recent Labs    11/09/19 1706 11/10/19 1018  NA  --  140  K  --  3.8  CL  --  109  CO2  --  20*  GLUCOSE  --  109*  BUN  --  9  CREATININE 0.70 0.72  CALCIUM  --  9.2    Intake/Output Summary (Last 24 hours) at 11/11/2019 0623 Last data filed at 11/11/2019 0749 Gross per 24 hour  Intake 388 ml  Output 301 ml  Net 87 ml        Physical Exam: Vital Signs Blood pressure 123/83, pulse 75, temperature 97.8 F (36.6 C), temperature source Oral, resp. rate 18, height 5\' 7"  (1.702 m), weight 57 kg, SpO2 100 %.  Constitutional: No distress . Vital signs reviewed. HEENT: EOMI, oral membranes moist Neck: supple Cardiovascular: RRR without murmur. No JVD    Respiratory/Chest: CTA Bilaterally without wheezes or rales. Normal effort    GI/Abdomen: BS +, non-tender, non-distended Ext: no clubbing, cyanosis, or edema Psych: pleasant and cooperative Neuro: pt oriented to person, place, reason to an extent. Is easily distracted. Right upper ext with pill rolling tremor. Left upper ext with tremor also. Left hand is clenched. Can loosen grip when cued or distracted/relaxed. .  Musculoskeletal: Full ROM, No pain with AROM or PROM in the neck, trunk, or extremities. Posture appropriate Psych: very anxious      Assessment/Plan: 1. Functional deficits secondary to acute, progressive dementia which require 3+ hours per day of interdisciplinary therapy in a comprehensive inpatient rehab setting.  Physiatrist is providing close team supervision and 24 hour management of active medical problems listed  below.  Physiatrist and rehab team continue to assess barriers to discharge/monitor patient progress toward functional and medical goals  Care Tool:  Bathing        Body parts bathed by helper: Front perineal area, Buttocks     Bathing assist Assist Level: Total Assistance - Patient < 25%     Upper Body Dressing/Undressing Upper body dressing   What is the patient wearing?: Hospital gown only    Upper body assist Assist Level: Maximal Assistance - Patient 25 - 49%    Lower Body Dressing/Undressing Lower body dressing      What is the patient wearing?: Underwear/pull up     Lower body assist Assist for lower body dressing: Total Assistance - Patient < 25%     Toileting Toileting    Toileting assist Assist for toileting: 2 Helpers     Transfers Chair/bed transfer  Transfers assist     Chair/bed transfer assist level: Dependent - Patient 0% Charlaine Dalton)     Locomotion Ambulation   Ambulation assist   Ambulation activity did not occur: Safety/medical concerns (limited by anxiety and decreased strength/balance)          Walk 10 feet activity   Assist  Walk 10 feet activity did not occur: Safety/medical concerns        Walk 50 feet activity   Assist Walk 50 feet with 2 turns activity did not occur: Safety/medical concerns  Walk 150 feet activity   Assist Walk 150 feet activity did not occur: Safety/medical concerns         Walk 10 feet on uneven surface  activity   Assist Walk 10 feet on uneven surfaces activity did not occur: Safety/medical concerns         Wheelchair     Assist     Wheelchair activity did not occur: Safety/medical concerns (limited by decreased cognition and anxiety)         Wheelchair 50 feet with 2 turns activity    Assist    Wheelchair 50 feet with 2 turns activity did not occur: Safety/medical concerns       Wheelchair 150 feet activity     Assist  Wheelchair 150 feet  activity did not occur: Safety/medical concerns       Blood pressure 123/83, pulse 75, temperature 97.8 F (36.6 C), temperature source Oral, resp. rate 18, height 5\' 7"  (1.702 m), weight 57 kg, SpO2 100 %.  Medical Problem List and Plan: 1.Progressive decrease in functional mobility, impaired cognition, apraxia with panic attacks generalized weakness and fallssecondary to acute metabolic encephalopathy/progressive dementiaof unknown etiology. -reviewed lab work up as ordered by acute, all negative -Continue Sinemet trial 25-100 mg 3 times daily -patient may shower -ELOS/Goals: 14-16 days, min assist goals with PT, OT, SLP 2. Antithrombotics: -DVT/anticoagulation:Lovenox -antiplatelet therapy: N/A 3. Pain Management:Tylenolas needed 4. Mood:Zoloft 50 mg daily, Valium12.5mg  q12 prn although it appears that she has been using more often than not at home. -asked neuropsychology to see patient -pt sees psychiatry as outpt -antipsychotic agents: N/A  -will add melatonin to assist with sleep, check sleep chart 5. Neuropsych: This patientis notcapable of making decisions on herown behalf. 6. Skin/Wound Care:Routine skin checks 7. Fluids/Electrolytes/Nutrition:encourage po  -I personally reviewed the patient's labs today.    -resumed megace per home regimen  -added supplemental shakes 8. Hyper thyroidism. Continue Tapazole 5 mg 3 times daily as well as Inderal 60 mg daily. Patient is to see Dr. Buddy Duty of endocrinology outpatient 9. Elevated LFT's-  -290/264/50/2.1 on 10/5  -?related to tapazole, but was on this before admit as other meds. Reviewed with pharmacy. No obvious culprits  -recheck LFT's 10/7 as not yet drawn today.     LOS: 2 days A FACE TO FACE EVALUATION WAS PERFORMED  Meredith Staggers 11/11/2019, 8:22 AM

## 2019-11-11 NOTE — Progress Notes (Signed)
Occupational Therapy Session Note  Patient Details  Name: Hannah Padilla MRN: [id number] Date of Birth: 1951-01-04  Today's Date: 11/11/2019 OT Individual Time: 5072-2575 OT Individual Time Calculation (min): 42 min    Short Term Goals: Week 1:  OT Short Term Goal 1 (Week 1): Pt will complete UB dressing with mod assist. OT Short Term Goal 2 (Week 1): Pt will complete sit <>stand with mod assist in preperation for LB self care. OT Short Term Goal 3 (Week 1): Pt will complete oral hygiene sitting sinkside with min assist. OT Short Term Goal 4 (Week 1): Pt will bathe UB with mod assist in seated position.  Skilled Therapeutic Interventions/Progress Updates:    Pt received supine, tearful, clutching fists and very visibly anxious/emotional. Pt with nonsensical confabulation throughout session. Gentle redirection provided frequently, with no carryover. Pt was guided through a gentle meditation, with relaxing wave music played, deep diaphragmatic breathing, and progressive muscle relaxation. Pt was able to release fists and become visibly more calmed. Pt was given extra time and max cueing/gentle direction for transitioning from supine to EOB. Mod A provided for transfer. Pt actively hallucinating throughout session. Pt requested to lay back down and did so with max A. Pt drank several sips of water with encouragement. Pt's arms in catatonic pattern- when passively assisted, she holds them where they were left with significant tone/muscle contraction. A large sticky note was placed on the wall and pt was guided through coming up with daily goals for therapy sessions. Pt was left supine with all needs met, bed alarm set.   Therapy Documentation Precautions:  Precautions Precautions: Fall Precaution Comments: anxious with mobility-resists posteriorly; tremors bilateral hands; watch skin integrity bilateral palms due to clenching fists intermittently Restrictions Weight Bearing Restrictions:  No  Therapy/Group: Individual Therapy  Curtis Sites 11/11/2019, 7:29 AM

## 2019-11-12 ENCOUNTER — Inpatient Hospital Stay (HOSPITAL_COMMUNITY): Payer: Medicare Other

## 2019-11-12 ENCOUNTER — Inpatient Hospital Stay (HOSPITAL_COMMUNITY): Payer: Medicare Other | Admitting: Speech Pathology

## 2019-11-12 LAB — HEPATIC FUNCTION PANEL
ALT: 262 U/L — ABNORMAL HIGH (ref 0–44)
AST: 170 U/L — ABNORMAL HIGH (ref 15–41)
Albumin: 3 g/dL — ABNORMAL LOW (ref 3.5–5.0)
Alkaline Phosphatase: 367 U/L — ABNORMAL HIGH (ref 38–126)
Bilirubin, Direct: 0.8 mg/dL — ABNORMAL HIGH (ref 0.0–0.2)
Indirect Bilirubin: 1.2 mg/dL — ABNORMAL HIGH (ref 0.3–0.9)
Total Bilirubin: 2 mg/dL — ABNORMAL HIGH (ref 0.3–1.2)
Total Protein: 5.5 g/dL — ABNORMAL LOW (ref 6.5–8.1)

## 2019-11-12 NOTE — Progress Notes (Signed)
Occupational Therapy Session Note  Patient Details  Name: Hannah Padilla MRN: [id number] Date of Birth: 04-12-1950  Today's Date: 11/12/2019 OT Individual Time: 1300-1415 OT Individual Time Calculation (min): 75 min    Short Term Goals: Week 1:  OT Short Term Goal 1 (Week 1): Pt will complete UB dressing with mod assist. OT Short Term Goal 2 (Week 1): Pt will complete sit <>stand with mod assist in preperation for LB self care. OT Short Term Goal 3 (Week 1): Pt will complete oral hygiene sitting sinkside with min assist. OT Short Term Goal 4 (Week 1): Pt will bathe UB with mod assist in seated position.  Skilled Therapeutic Interventions/Progress Updates:    1:1. Pt received in bed agreeable to OT. Husband present for all tx and assisting as +2 for transfers, even transfering pt back to bed after toileting with OT as +2. Pt reporting feeling need to use restroom. Pt requries MAX A of 1 to stand pivot to [facility] Pt demo L inattention, apraxia and FOF during transfer with manual facilitation of weight shifting. Husband manages pants past hips. Despite prolonged time to void, and  positioning with block under feet to facilitate 90/90/90 with flooe, pt unable to void. Upon return to bed pt able to assist minimally for scooting to [facility] with +2 A. Pt bridges 2x15 with minimal hip clearance up off bed, and B clamshells 2x5 in sidelying. Husband educated on increased time for processing after cues when giving pt instruction and decreasing words used as he narrates what is happening to pt. Exited session wihtpt seated in bed and NT in room bladder scanning  Therapy Documentation Precautions:  Precautions Precautions: Fall Precaution Comments: anxious with mobility-resists posteriorly; tremors bilateral hands; watch skin integrity bilateral palms due to clenching fists intermittently Restrictions Weight Bearing Restrictions: No General:   Vital Signs:   Pain:   ADL: ADL Grooming: Dependent Where  Assessed-Grooming: Sitting at sink Toileting: Dependent Where Assessed-Toileting: Bed level Vision   Perception    Praxis   Exercises:   Other Treatments:     Therapy/Group: Individual Therapy  [name] 11/12/2019, 1:02 PM

## 2019-11-12 NOTE — Progress Notes (Signed)
Physical Therapy Session Note  Patient Details  Name: Hannah Padilla MRN: [id number] Date of Birth: [birth year]  Today's Date: 11/12/2019 PT Individual Time: 0800-0900 PT Individual Time Calculation (min): 60 min   Short Term Goals: Week 1:  PT Short Term Goal 1 (Week 1): Patient will perform bed mobility with max A of 1 person consistently. PT Short Term Goal 2 (Week 1): Patient will perform dynamic sitting balance with mod A. PT Short Term Goal 3 (Week 1): Patient will perform transfer with max A of 1 person.  Skilled Therapeutic Interventions/Progress Updates:     Patient sitting up in bed working on self-feeding while eating breakfast with her husband upon PT arrival. Patient visibly fatigued and tearful due to poor frustration tolerance with the challenge of the task. PT offered the patient a break from the activity which she accepted. Patient's husband was eager to share that the patient had performed LE PROM progressing to AROM exercises in the bed and performed self feeding throughout breakfast with him assisting her this morning. Patient resting in bed an falling asleep from increased fatigue. Focused on caregiver education with patient's husband during session.  Educated on minimizing level of activity outside of therapies to promote increased activity tolerance during therapy sessions. Encouraged him to engage the patient in activities that promote increased independence, such as self feeding, but watch for signs of fatigue and frustration and allow the patient to take breaks. Discussed POC, PT goals, and management of expectations. Encouraged him to communicate with the medical team about prognosis and patient's conditions to assist with management of expectations. MD rounded later during the session and he did speak to Dr. Naaman Plummer about this and asked several other appropriate questions. Provided positive reinforcement for advocating for the patient throughout her stay. Discussed caregiver  support at d/c, he reports that he is the only one that can assist her and will be limited by 24/7 supervision for the patient to perform tasks such as grocery shopping, lighting the fire for heat in their home, and other tasks that would require him to leave her for periods of time. He also had questions about follow-up care, asking about SNF versus HH therapies and HH aids. Provided general education on each of these services and encouraged him to speak with the CSW for more information and informed him that the team will be discussing this during conference next Tuesday. Finally, discussed home set-up, reports that they have a 2 level home with main bed/bath on the second level. Does have a 1/2 bath and bedroom on the first level that the patient could stay in if necessary.   Patient in bed asleep with her husband at bedside at end of session with breaks locked, bed alarm set, and all needs within reach.    Therapy Documentation Precautions:  Precautions Precautions: Fall Precaution Comments: anxious with mobility-resists posteriorly; tremors bilateral hands; watch skin integrity bilateral palms due to clenching fists intermittently Restrictions Weight Bearing Restrictions: No   Therapy/Group: Individual Therapy  Blaiden Werth L Mansel Strother PT, DPT  11/12/2019, 12:28 PM

## 2019-11-12 NOTE — Progress Notes (Signed)
Speech Language Pathology Daily Session Note  Patient Details  Name: Hannah Padilla MRN: [id number] Date of Birth: [birth year]  Today's Date: 11/12/2019 SLP Individual Time: 1000-1100 SLP Individual Time Calculation (min): 60 min  Short Term Goals: Week 1: SLP Short Term Goal 1 (Week 1): Pt will demonstrate basic problem solving skills by using call bell to request assistance with mod A multimodal cues. SLP Short Term Goal 2 (Week 1): Pt will demonstrate sustained attention in 3-5 minute intervals with max A verbal cues. SLP Short Term Goal 3 (Week 1): Pt will express wants/needs coherently with mod A verbal cues. SLP Short Term Goal 4 (Week 1): Pt will follow 1 step commands with 60% accuracy with max A multimodal cues. SLP Short Term Goal 5 (Week 1): Pt will increase vocal intensity to 70% intelligibility at the sentence level with max A verbal cues. SLP Short Term Goal 6 (Week 1): Pt will consume dys 3 textures and thin liquid diet with minimal overt s/s aspiration and mod A verbal cues for swallow strategies.  Skilled Therapeutic Interventions: Skilled treatment session focused on cognitive goals. Upon arrival, patient was sleeping soundly and did not rouse to verbal or tactile stimulation or in changes to the environment. Patient eventually opened her eyes when transferred to edge of bed with total A. Patient appeared lethargic and perseverative on wanting to go back to sleep and wanting to go home. With Mod A verbal and tactile cues and extra time, patient performed 10 repetitions of EMST at 10 cm H2O accurately. Patient reported she needed to void and have a bowel movement and was placed on the bedpan. Patient with difficulty attending to task and was unable to use the bathroom. With Max A verbal cues, patient's overall vocal intensity improved at the word and phrase level to ~75%. Patient with intermittent language of confusion throughout session. Patient also requested multiple sips of thin  liquids and required Max-Total A for self-feeding. Patient left upright in bed with alarm on and all needs within reach. Continue with current plan of care.      Pain No/Denies Pain   Therapy/Group: Individual Therapy  [name] 11/12/2019, 12:45 PM

## 2019-11-12 NOTE — Progress Notes (Signed)
During therapy this AM pt expressed that she "just wanted to die" twice. Reassessed pt and pt states she does not have a plan and is remorseful for expressing those thoughts. She states that she was irritated during cares. PA Dan notified and stated that neuropsych Dr. Tonye Becket is aware and will visit with pt. No new orders received.  Sheela Stack, LPN

## 2019-11-12 NOTE — IPOC Note (Signed)
Overall Plan of Care [facility]) Patient Details Name: Hannah Padilla MRN: [id number] DOB: Nov 12, 1950  Admitting Diagnosis: Progressive dementia with uncertain etiology [facility])  Hospital Problems: Principal Problem:   Progressive dementia with uncertain etiology ([place]) Active Problems:   Acute metabolic encephalopathy   Anxiety disorder due to general medical condition     Functional Problem List: Nursing Behavior, Bladder, Bowel, Endurance, Medication Management, Nutrition, Pain, Safety, Perception, Skin Integrity  PT Balance, Behavior, Edema, Endurance, Motor, Nutrition, Pain, Perception, Safety, Sensory, Skin Integrity  OT Balance, Perception, Behavior, Safety, Cognition, Sensory, Endurance, Skin Integrity, Vision, Motor  SLP Cognition  TR         Basic ADL's: OT Grooming, Eating, Bathing, Dressing, Toileting     Advanced  ADL's: OT       Transfers: PT Bed Mobility, Bed to Chair, Car, Manufacturing systems engineer, Metallurgist: PT Ambulation, Emergency planning/management officer, Stairs     Additional Impairments: OT Fuctional Use of Upper Extremity  SLP Swallowing, Communication, Social Cognition expression, comprehension Attention, Problem Solving, Awareness, Memory  TR      Anticipated Outcomes Item Anticipated Outcome  Self Feeding mod assist  Swallowing  Min A   Basic self-care  mod assist  Toileting  mod assist   Bathroom Transfers mod assist  Bowel/Bladder  patient will be continent of bowel and bladder with mod assist  Transfers  mod A using LRAD  Locomotion  n/a  Communication  Min A  Cognition  Min A  Pain  pain will be less than or equal to 4/10 with min assist  Safety/Judgment  patient will be free from falls/injury and making appropriate safety decisions with min assist   Therapy Plan: PT Intensity: Minimum of 1-2 x/day ,45 to 90 minutes PT Frequency: 5 out of 7 days PT Duration Estimated Length of Stay: 2-3 weeks OT Intensity: Minimum of 1-2 x/day,  45 to 90 minutes OT Frequency: 5 out of 7 days OT Duration/Estimated Length of Stay: 2-3 weeks SLP Intensity: Minumum of 1-2 x/day, 30 to 90 minutes SLP Frequency: 3 to 5 out of 7 days SLP Duration/Estimated Length of Stay: 2-3 weeks   Due to the current state of emergency, patients may not be receiving their 3-hours of Medicare-mandated therapy.   Team Interventions: Nursing Interventions Patient/Family Education, Bladder Management, Bowel Management, Pain Management, Disease Management/Prevention, Medication Management, Cognitive Remediation/Compensation, Dysphagia/Aspiration Precaution Training, Discharge Planning  PT interventions Ambulation/gait training, Cognitive remediation/compensation, Discharge planning, DME/adaptive equipment instruction, Functional mobility training, Pain management, Psychosocial support, Splinting/orthotics, Therapeutic Activities, UE/LE Strength taining/ROM, Visual/perceptual remediation/compensation, Training and development officer, Community reintegration, Disease management/prevention, Functional electrical stimulation, Neuromuscular re-education, Patient/family education, Skin care/wound management, Stair training, Therapeutic Exercise, Wheelchair propulsion/positioning, UE/LE Coordination activities  OT Interventions Training and development officer, Discharge planning, Self Care/advanced ADL retraining, Therapeutic Activities, UE/LE Coordination activities, Cognitive remediation/compensation, Disease mangement/prevention, Functional mobility training, Patient/family education, Therapeutic Exercise, Visual/perceptual remediation/compensation, DME/adaptive equipment instruction, Neuromuscular re-education, Psychosocial support, UE/LE Strength taining/ROM, Splinting/orthotics, Wheelchair propulsion/positioning  SLP Interventions Cueing hierarchy, Dysphagia/aspiration precaution training, Functional tasks, Cognitive remediation/compensation, Environmental controls,  Internal/external aids, Patient/family education, Speech/Language facilitation  TR Interventions    SW/CM Interventions Discharge Planning, Psychosocial Support, Patient/Family Education   Barriers to Discharge MD  Medical stability and Behavior  Nursing      PT Inaccessible home environment, Decreased caregiver support, Incontinence, Behavior Patient with significant cognitive deficits and anxiety, generalized deconditioing and resistant to mobility due to anxiety putting patient at increased fall risk, patient's husband may be only caregiver at d/c,  may have STE home  OT      SLP      SW Decreased caregiver support, Lack of/limited family support     Team Discharge Planning: Destination: PT-Home (may need SNF placement at d/c depending on family support at home) ,OT- Home , SLP-Home Projected Follow-up: PT-Home health PT, OT-  24 hour supervision/assistance, SLP-Home Health SLP, 24 hour supervision/assistance Projected Equipment Needs: PT-To be determined, OT- To be determined, SLP-None recommended by SLP Equipment Details: PT-Will provide DME recs based on patient's progress with mobility, OT-  Patient/family involved in discharge planning: PT- Patient,  OT-Patient, SLP-Patient  MD ELOS: 2-3 weeks Medical Rehab Prognosis:  Good Assessment: The patient has been admitted for CIR therapies with the diagnosis of progressive dementia, gait disorder. The team will be addressing functional mobility, strength, stamina, balance, safety, adaptive techniques and equipment, self-care, bowel and bladder mgt, patient and caregiver education, NMR, cognitive-behavioral therapy, mood mgt, pain control, community reentry. Goals have been set at min to mod assist.   Due to the current state of emergency, patients may not be receiving their 3 hours per day of Medicare-mandated therapy.    Meredith Staggers, MD, FAAPMR      See Team Conference Notes for weekly updates to the plan of care

## 2019-11-12 NOTE — Progress Notes (Signed)
[place] PHYSICAL MEDICINE & REHABILITATION PROGRESS NOTE   Subjective/Complaints: Pt slept very well last night. Husband says it's the best night she's had in awhile. Had her up early eating, active. Now she's fatigued and asleep  ROS: Limited due to cognitive/behavioral   Objective:   No results found. Recent Labs    11/09/19 1706 11/10/19 1018  WBC 6.2 6.6  HGB 13.3 13.7  HCT 40.0 40.2  PLT 199 218   Recent Labs    11/09/19 1706 11/10/19 1018  NA  --  140  K  --  3.8  CL  --  109  CO2  --  20*  GLUCOSE  --  109*  BUN  --  9  CREATININE 0.70 0.72  CALCIUM  --  9.2    Intake/Output Summary (Last 24 hours) at 11/12/2019 5361 Last data filed at 11/12/2019 0600 Gross per 24 hour  Intake --  Output 505 ml  Net -505 ml        Physical Exam: Vital Signs Blood pressure 117/80, pulse 69, temperature (!) 97.5 F (36.4 C), resp. rate 16, height 5\' 7"  (1.702 m), weight 57 kg, SpO2 100 %.  Constitutional: No distress . Vital signs reviewed. HEENT: EOMI, oral membranes moist Neck: supple Cardiovascular: RRR without murmur. No JVD    Respiratory/Chest: CTA Bilaterally without wheezes or rales. Normal effort    GI/Abdomen: BS +, non-tender, non-distended Ext: no clubbing, cyanosis, or edema  Neuro: pt sound asleep. No tremor or abnl movements seen. Limbs are relaxed Musculoskeletal: no pain with PROM Psych: asleep      Assessment/Plan: 1. Functional deficits secondary to acute, progressive dementia which require 3+ hours per day of interdisciplinary therapy in a comprehensive inpatient rehab setting.  Physiatrist is providing close team supervision and 24 hour management of active medical problems listed below.  Physiatrist and rehab team continue to assess barriers to discharge/monitor patient progress toward functional and medical goals  Care Tool:  Bathing        Body parts bathed by helper: Front perineal area, Buttocks     Bathing assist Assist  Level: Total Assistance - Patient < 25%     Upper Body Dressing/Undressing Upper body dressing   What is the patient wearing?: Hospital gown only    Upper body assist Assist Level: Maximal Assistance - Patient 25 - 49%    Lower Body Dressing/Undressing Lower body dressing      What is the patient wearing?: Underwear/pull up     Lower body assist Assist for lower body dressing: Total Assistance - Patient < 25%     Toileting Toileting    Toileting assist Assist for toileting: 2 Helpers     Transfers Chair/bed transfer  Transfers assist     Chair/bed transfer assist level: Dependent - mechanical lift (Stedy)     Locomotion Ambulation   Ambulation assist   Ambulation activity did not occur: Safety/medical concerns (limited by anxiety and decreased strength/balance)          Walk 10 feet activity   Assist  Walk 10 feet activity did not occur: Safety/medical concerns        Walk 50 feet activity   Assist Walk 50 feet with 2 turns activity did not occur: Safety/medical concerns         Walk 150 feet activity   Assist Walk 150 feet activity did not occur: Safety/medical concerns         Walk 10 feet on uneven surface  activity  Assist Walk 10 feet on uneven surfaces activity did not occur: Safety/medical concerns         Wheelchair     Assist     Wheelchair activity did not occur: Safety/medical concerns (limited by decreased cognition and anxiety)         Wheelchair 50 feet with 2 turns activity    Assist    Wheelchair 50 feet with 2 turns activity did not occur: Safety/medical concerns       Wheelchair 150 feet activity     Assist  Wheelchair 150 feet activity did not occur: Safety/medical concerns       Blood pressure 117/80, pulse 69, temperature (!) 97.5 F (36.4 C), resp. rate 16, height 5\' 7"  (1.702 m), weight 57 kg, SpO2 100 %.  Medical Problem List and Plan: 1.Progressive decrease in  functional mobility, impaired cognition, apraxia with panic attacks generalized weakness and fallssecondary to acute metabolic encephalopathy/progressive dementiaof unknown etiology. -reviewed lab work up as ordered by acute, all negative, CJD pending -Continue Sinemet trial 25-100 mg 3 times daily -patient may shower -ELOS/Goals: 14-16 days, min assist goals with PT, OT, SLP 2. Antithrombotics: -DVT/anticoagulation:Lovenox -antiplatelet therapy: N/A 3. Pain Management:Tylenolas needed 4. Mood:Zoloft 50 mg daily, Valium12.5mg  q12 prn although it appears that she has been using more often than not at home. -asked neuropsychology to see patient -pt sees psychiatry as outpt -antipsychotic agents: N/A  -continue melatonin to assist with sleep, sleep chart 5. Neuropsych: This patientis notcapable of making decisions on herown behalf. 6. Skin/Wound Care:Routine skin checks 7. Fluids/Electrolytes/Nutrition:encourage po       -resumed megace per home regimen  -drinking supplemental shakes 8. Hyper thyroidism. Continue Tapazole 5 mg 3 times daily as well as Inderal 60 mg daily. Patient is to see Dr. Buddy Duty of endocrinology outpatient 9. Elevated LFT's-  -slight improvement in AST and ALT today  -spoke at length with husband  -could related to tapazole, but was on this before admit as other meds.   -continue serial LFT's   -if without further improvement tomorrow will check hepatitis panel, u/s liver/gb   -consider stopping tapazole    LOS: 3 days A FACE TO FACE EVALUATION WAS PERFORMED  Meredith Staggers 11/12/2019, 9:22 AM

## 2019-11-13 ENCOUNTER — Inpatient Hospital Stay (HOSPITAL_COMMUNITY): Payer: Medicare Other

## 2019-11-13 ENCOUNTER — Encounter (HOSPITAL_COMMUNITY): Payer: Medicare Other | Admitting: Psychology

## 2019-11-13 ENCOUNTER — Inpatient Hospital Stay (HOSPITAL_COMMUNITY): Payer: Medicare Other | Admitting: Speech Pathology

## 2019-11-13 DIAGNOSIS — F447 Conversion disorder with mixed symptom presentation: Secondary | ICD-10-CM

## 2019-11-13 LAB — HEPATIC FUNCTION PANEL
ALT: 270 U/L — ABNORMAL HIGH (ref 0–44)
AST: 185 U/L — ABNORMAL HIGH (ref 15–41)
Albumin: 2.9 g/dL — ABNORMAL LOW (ref 3.5–5.0)
Alkaline Phosphatase: 403 U/L — ABNORMAL HIGH (ref 38–126)
Bilirubin, Direct: 1.2 mg/dL — ABNORMAL HIGH (ref 0.0–0.2)
Indirect Bilirubin: 1.1 mg/dL — ABNORMAL HIGH (ref 0.3–0.9)
Total Bilirubin: 2.3 mg/dL — ABNORMAL HIGH (ref 0.3–1.2)
Total Protein: 5.4 g/dL — ABNORMAL LOW (ref 6.5–8.1)

## 2019-11-13 LAB — HEPATITIS PANEL, ACUTE
HCV Ab: NONREACTIVE
Hep A IgM: NONREACTIVE
Hep B C IgM: NONREACTIVE
Hepatitis B Surface Ag: NONREACTIVE

## 2019-11-13 MED ORDER — MEGESTROL ACETATE 400 MG/10ML PO SUSP
800.0000 mg | Freq: Two times a day (BID) | ORAL | Status: DC
  Administered 2019-11-13 – 2019-12-04 (×41): 800 mg via ORAL
  Filled 2019-11-13 (×43): qty 20

## 2019-11-13 NOTE — Progress Notes (Signed)
Speech Language Pathology Daily Session Note  Patient Details  Name: Hannah Padilla MRN: [id number] Date of Birth: [birth year]  Today's Date: 11/13/2019 SLP Individual Time: 3151-7616 SLP Individual Time Calculation (min): 55 min  Short Term Goals: Week 1: SLP Short Term Goal 1 (Week 1): Pt will demonstrate basic problem solving skills by using call bell to request assistance with mod A multimodal cues. SLP Short Term Goal 2 (Week 1): Pt will demonstrate sustained attention in 3-5 minute intervals with max A verbal cues. SLP Short Term Goal 3 (Week 1): Pt will express wants/needs coherently with mod A verbal cues. SLP Short Term Goal 4 (Week 1): Pt will follow 1 step commands with 60% accuracy with max A multimodal cues. SLP Short Term Goal 5 (Week 1): Pt will increase vocal intensity to 70% intelligibility at the sentence level with max A verbal cues. SLP Short Term Goal 6 (Week 1): Pt will consume dys 3 textures and thin liquid diet with minimal overt s/s aspiration and mod A verbal cues for swallow strategies.  Skilled Therapeutic Interventions: Skilled treatment session focused on cognitive goals. SLP facilitated session by providing Mod A verbal cues and extra time for patient to complete 25 repetitions of EMST at 15 cm H2O. SLP also facilitated session by providing overall Mod A verbal cues for use of an increased vocal intensity at the word and phrase level to achieve ~75% intelligibility during a verbal description task. Min A verbal cues were needed for verbal reasoning throughout task and Mod-Max A verbal cues were needed for sustained attention as patient was perseverative on using the bathroom. Patient's husband was present to provide support. Patient left upright in bed with alarm on and all needs within reach. Continue with current plan of care.      Pain No/Denies Pain   Therapy/Group: Individual Therapy  Tanasia Budzinski 11/13/2019, 2:31 PM

## 2019-11-13 NOTE — Progress Notes (Signed)
Physical Therapy Session Note  Patient Details  Name: Hannah Padilla MRN: [id number] Date of Birth: 19-Oct-1950  Today's Date: 11/13/2019 PT Individual Time: 0830-0930 PT Individual Time Calculation (min): 60 min   Short Term Goals: Week 1:  PT Short Term Goal 1 (Week 1): Patient will perform bed mobility with max A of 1 person consistently. PT Short Term Goal 2 (Week 1): Patient will perform dynamic sitting balance with mod A. PT Short Term Goal 3 (Week 1): Patient will perform transfer with max A of 1 person. Week 2:     Skilled Therapeutic Interventions/Progress Updates:    PAIN Pt denies pain Pt initially supine and agreeable to session.  Husband at bedside and very helpful w/care. Pt supine to sit w/mod assist, hand over hand facilitation to initiate.  Pt sits w/cga and dons socks, shoes, pants and shirt w/max assist, cga for balance in sitting.  Nursing provided meds while pt seated at edge of bed. STS w/mod assist of 2, heavy posterior lean primarily due to hips kept far post to feet.  Gait 6ft w/mod HHA of 2, husband assisting as +2, therapist providing manual anterior wt shifting of hips to prevent post lob, shuffling gait w/narrow base, pt very anxious.  Pt transported to day room for continued treatment.  Emotional support provided due to anxiety, husband accompanies ptatien to gym due to anxiety/emotional support, therapist able to keep pt calm and pt agrees to remain in gym at this time, only one other patient/therapist in gym.    STS w/mod assist of 2, therapist stabilizing feet due to tendency to slide forward w/transition due to post lean.  Gait 66ft as described above, turn/sit to chair w/heavy mod assist and cuing.  Therapist encourages pt, but pt states "I am doing terribly".    SPT armchair to wc w/heavy mod of 1, cues for sequencing, additional time, assurance of safety during transfer due to anxiety.  STS to elevated hi/lo table and stood w/mod assist primarily for ant  wt shift at hips.   Performed cross pattern peg puzzle, pt able to identify appropriate color pegs but mod assist to place peg into peg board and heavy cuing to place in proper hole.   Continued puzzle in sitting due to fatigue w/standing after 2-3 min. Pt w/progressively decling function w/puzzle due to fatigue.  Pt transported back to room.  STS and gt x 56ft w/HHA +2 (husband second person) to bed, turn/sit to edge w/mod assist and cues for sequencing. Removes shoes w/mod assist. Sit to supine w/mod cues/hand over hand assist to initiate, mod assist overall. Pt left supine w/rails up x 4, husband at bedside, alarm set, bed in lowest position, and needs in reach.    Therapy Documentation Precautions:  Precautions Precautions: Fall Precaution Comments: anxious with mobility-resists posteriorly; tremors bilateral hands; watch skin integrity bilateral palms due to clenching fists intermittently Restrictions Weight Bearing Restrictions: No   Therapy/Group: Individual Therapy  Callie Fielding, [place] 11/13/2019, 12:46 PM

## 2019-11-13 NOTE — Progress Notes (Addendum)
[place] PHYSICAL MEDICINE & REHABILITATION PROGRESS NOTE   Subjective/Complaints: Pt up early with therapy, walked in day room. Was finishing up a peg pattern exercise when I visited. Didn't sleep as well last night  ROS: Limited due to cognitive/behavioral   Objective:   No results found. No results for input(s): WBC, HGB, HCT, PLT in the last 72 hours. No results for input(s): NA, K, CL, CO2, GLUCOSE, BUN, CREATININE, CALCIUM in the last 72 hours.  Intake/Output Summary (Last 24 hours) at 11/13/2019 1123 Last data filed at 11/13/2019 0300 Gross per 24 hour  Intake 200 ml  Output 400 ml  Net -200 ml        Physical Exam: Vital Signs Blood pressure 105/67, pulse 69, temperature 98.6 F (37 C), temperature source Oral, resp. rate 16, height 5\' 7"  (1.702 m), weight 57 kg, SpO2 99 %.  Constitutional: No distress . Vital signs reviewed. HEENT: EOMI, oral membranes moist Neck: supple Cardiovascular: RRR without murmur. No JVD    Respiratory/Chest: CTA Bilaterally without wheezes or rales. Normal effort    GI/Abdomen: BS +, non-tender, non-distended Ext: no clubbing, cyanosis, or edema Psych: flat, delayed Neuro: pt awake, appears fatigued. Very delayed with processing. Head nods to questions. Impaired decision making. Resting/intentional tremors BUE.  Musculoskeletal: no pain with PROM        Assessment/Plan: 1. Functional deficits secondary to acute, progressive dementia which require 3+ hours per day of interdisciplinary therapy in a comprehensive inpatient rehab setting.  Physiatrist is providing close team supervision and 24 hour management of active medical problems listed below.  Physiatrist and rehab team continue to assess barriers to discharge/monitor patient progress toward functional and medical goals  Care Tool:  Bathing        Body parts bathed by helper: Front perineal area, Buttocks     Bathing assist Assist Level: Total Assistance - Patient <  25%     Upper Body Dressing/Undressing Upper body dressing   What is the patient wearing?: Hospital gown only    Upper body assist Assist Level: Maximal Assistance - Patient 25 - 49%    Lower Body Dressing/Undressing Lower body dressing      What is the patient wearing?: Underwear/pull up     Lower body assist Assist for lower body dressing: Total Assistance - Patient < 25%     Toileting Toileting    Toileting assist Assist for toileting: 2 Helpers     Transfers Chair/bed transfer  Transfers assist     Chair/bed transfer assist level: Dependent - mechanical lift (Stedy)     Locomotion Ambulation   Ambulation assist   Ambulation activity did not occur: Safety/medical concerns (limited by anxiety and decreased strength/balance)          Walk 10 feet activity   Assist  Walk 10 feet activity did not occur: Safety/medical concerns        Walk 50 feet activity   Assist Walk 50 feet with 2 turns activity did not occur: Safety/medical concerns         Walk 150 feet activity   Assist Walk 150 feet activity did not occur: Safety/medical concerns         Walk 10 feet on uneven surface  activity   Assist Walk 10 feet on uneven surfaces activity did not occur: Safety/medical concerns         Wheelchair     Assist Will patient use wheelchair at discharge?: No (not anticipated per PT long term goals)  Wheelchair activity did not occur: Safety/medical concerns (limited by decreased cognition and anxiety)         Wheelchair 50 feet with 2 turns activity    Assist    Wheelchair 50 feet with 2 turns activity did not occur: Safety/medical concerns       Wheelchair 150 feet activity     Assist  Wheelchair 150 feet activity did not occur: Safety/medical concerns       Blood pressure 105/67, pulse 69, temperature 98.6 F (37 C), temperature source Oral, resp. rate 16, height 5\' 7"  (1.702 m), weight 57 kg, SpO2 99  %.  Medical Problem List and Plan: 1.Progressive decrease in functional mobility, impaired cognition, apraxia with panic attacks generalized weakness and fallssecondary to acute metabolic encephalopathy/progressive dementiaof unknown etiology.  10/8-spoke with neuropsychology who felt that patient's presentation given exam and past history was most consistent with a functional neurological disorder. This makes sense given negative w/u by neurology and presentation which can fluctuate with anxiety levels. Also she is making generalized progress over the past week. Continue supportive and restorative measures as we are doing.  -  CJD pending -Continue Sinemet trial 25-100 mg 3 times daily -patient may shower  -ELOS/Goals: 14-16 days, min assist goals with PT, OT, SLP 2. Antithrombotics: -DVT/anticoagulation:Lovenox -antiplatelet therapy: N/A 3. Pain Management:Tylenolas needed 4. Mood:Zoloft 50 mg daily, Valium12.5mg  q12 scheduled -pt sees psychiatry as outpt -antipsychotic agents: N/A  -continue melatonin to assist with sleep, sleep chart  -pt/husband are aware of importance of regular sleep in her regimen 5. Neuropsych: This patientis notcapable of making decisions on herown behalf. 6. Skin/Wound Care:Routine skin checks 7. Fluids/Electrolytes/Nutrition:encourage po    -resumed megace. Intake still touch and go. Increase to 800mg  bid per home regimen until po picks up   -drinking supplemental shakes  8. Hyper thyroidism. Hold Tapazole 5 mg 3 times daily per #9. continue Inderal 60 mg daily.   -Make referral to Dr. Buddy Duty for end of this month.  9. Elevated LFT's-  10/8 -Continued upward trend   -consulted with pharmacy and endocrine.  -in doing research, it appears that tapazole was started on 8/31 (at least that's the first time I see it written or mentioned). LFT's prior were  normal and there has been a steady trend upward since  -will stop tapazole  -check hepatitis panel today  -follow LFT's serially  -recheck TFT's late next week  -reviewed plan with husband  Greater than 35 total minutes was spent in examination of patient, assessment of pertinent data,  formulation of a treatment plan, and in discussion with patient and/or family.       LOS: 4 days A FACE TO FACE EVALUATION WAS PERFORMED  Meredith Staggers 11/13/2019, 11:23 AM

## 2019-11-13 NOTE — Progress Notes (Signed)
Occupational Therapy Session Note  Patient Details  Name: Hannah Padilla MRN: [id number] Date of Birth: 02-10-1950  Today's Date: 11/13/2019 OT Individual Time: 1020-1100 OT Individual Time Calculation (min): 40 min    Short Term Goals: Week 1:  OT Short Term Goal 1 (Week 1): Pt will complete UB dressing with mod assist. OT Short Term Goal 2 (Week 1): Pt will complete sit <>stand with mod assist in preperation for LB self care. OT Short Term Goal 3 (Week 1): Pt will complete oral hygiene sitting sinkside with min assist. OT Short Term Goal 4 (Week 1): Pt will bathe UB with mod assist in seated position.  Skilled Therapeutic Interventions/Progress Updates:    1;1. Pt received in bed after missing 20 min skilled OT d/t neuropsychologist appointment running over. Pt Missed 20 min skilled OT and asleep upon arrival. Pt aroused easily and agreeable to EOB oral care. Pt requires total cuing and initially HOH reaching for items on tabletop in min ranges outside BOS to gather items. Pt able ot brush teeth with S with sigificantly increased time to execute commands. Pt completes stand pivot transfer with MOD-MAX A overall with posterior lean but improved stepping this date in B directions (pt wants to automattically step L during transfers req increased cues to step R toget back into bed). Pt provided total A to wash hair at sink, and HOH A to brush hair. Exited session with pt seated in bed, exit alarm on and call light in reach  Therapy Documentation Precautions:  Precautions Precautions: Fall Precaution Comments: anxious with mobility-resists posteriorly; tremors bilateral hands; watch skin integrity bilateral palms due to clenching fists intermittently Restrictions Weight Bearing Restrictions: No General:   Vital Signs: Therapy Vitals Temp: 98.6 F (37 C) Temp Source: Oral Pulse Rate: 69 Resp: 16 BP: 105/67 Patient Position (if appropriate): Lying Oxygen Therapy SpO2: 99 % O2 Device:  Room Air Pain:   ADL: ADL Grooming: Dependent Where Assessed-Grooming: Sitting at sink Toileting: Dependent Where Assessed-Toileting: Bed level Vision   Perception    Praxis   Exercises:   Other Treatments:     Therapy/Group: Individual Therapy  [name] 11/13/2019, 7:00 AM

## 2019-11-14 ENCOUNTER — Inpatient Hospital Stay (HOSPITAL_COMMUNITY): Payer: Medicare Other | Admitting: Occupational Therapy

## 2019-11-14 ENCOUNTER — Inpatient Hospital Stay (HOSPITAL_COMMUNITY): Payer: Medicare Other

## 2019-11-14 ENCOUNTER — Inpatient Hospital Stay (HOSPITAL_COMMUNITY): Payer: Medicare Other | Admitting: Speech Pathology

## 2019-11-14 LAB — HEPATIC FUNCTION PANEL
ALT: 395 U/L — ABNORMAL HIGH (ref 0–44)
AST: 170 U/L — ABNORMAL HIGH (ref 15–41)
Albumin: 3 g/dL — ABNORMAL LOW (ref 3.5–5.0)
Alkaline Phosphatase: 426 U/L — ABNORMAL HIGH (ref 38–126)
Bilirubin, Direct: 2.2 mg/dL — ABNORMAL HIGH (ref 0.0–0.2)
Indirect Bilirubin: 1.7 mg/dL — ABNORMAL HIGH (ref 0.3–0.9)
Total Bilirubin: 3.9 mg/dL — ABNORMAL HIGH (ref 0.3–1.2)
Total Protein: 5.3 g/dL — ABNORMAL LOW (ref 6.5–8.1)
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
## 2019-11-14 NOTE — Progress Notes (Signed)
[facility]ICINE [facility]ILIT[facility] PROGRESS NOTE   Subjective/Complaints:   Crying- "wants to see husband".   ROS: limited by cognition  Objective:   No results found. No results for input(s): WBC, HGB, HCT, PLT in the last 72 hours. No results for input(s): NA, K, CL, CO2, GLUCOSE, BUN, CREATININE, CALCIUM in the last 72 hours.  Intake/Output Summary (Last 24 hours) at 11/14/2019 1308 Last data filed at 11/14/2019 0700 Gross per 24 hour  Intake 110 ml  Output 200 ml  Net -90 ml        Physical Exam: Vital Signs Blood pressure 117/60, pulse 73, temperature 97.8 F (36.6 C), temperature source Axillary, resp. rate 18, height 5\' 7"  (1.702 m), weight 57 kg, SpO2 98 %.  Constitutional: No distress . Vital signs reviewed. Crying/tearful in bed- asking constantly about husband, NAD HEENT: EOMI, oral membranes moist Neck: supple Cardiovascular: RRR    Respiratory/Chest: CTA B/L- no W/R/R- good air movement GI/Abdomen:  Soft, NT, ND, (+)BS  Ext: no clubbing, cyanosis, or edema Psych: flat, delayed Neuro: pt awake, appears fatigued. Very delayed with processing. Head nods to questions. Impaired decision making. Resting/intentional tremors BUE.  Musculoskeletal: no pain with PROM        Assessment/Plan: 1. Functional deficits secondary to acute, progressive dementia which require 3+ hours per day of interdisciplinary therapy in a comprehensive inpatient rehab setting.  Physiatrist is providing close team supervision and 24 hour management of active medical problems listed below.  Physiatrist and rehab team continue to assess barriers to discharge/monitor patient progress toward functional and medical goals  Care Tool:  Bathing    Body parts bathed by patient: Chest, Abdomen, Left arm   Body parts bathed by helper: Right arm, Front perineal area, Buttocks, Right upper leg, Left upper leg, Right lower leg, Left lower leg     Bathing assist Assist Level:  Maximal Assistance - Patient 24 - 49%     Upper Body Dressing/Undressing Upper body dressing   What is the patient wearing?: Pull over shirt    Upper body assist Assist Level: Maximal Assistance - Patient 25 - 49%    Lower Body Dressing/Undressing Lower body dressing      What is the patient wearing?: Underwear/pull up, Pants     Lower body assist Assist for lower body dressing: Dependent - Patient 0%     Toileting Toileting    Toileting assist Assist for toileting: 2 Helpers     Transfers Chair/bed transfer  Transfers assist     Chair/bed transfer assist level: 2 Helpers     Locomotion Ambulation   Ambulation assist   Ambulation activity did not occur: Safety/medical concerns (limited by anxiety and decreased strength/balance)  Assist level: 2 helpers Assistive device: Hand held assist Max distance: 50   Walk 10 feet activity   Assist  Walk 10 feet activity did not occur: Safety/medical concerns  Assist level: 2 helpers Assistive device: Hand held assist   Walk 50 feet activity   Assist Walk 50 feet with 2 turns activity did not occur: Safety/medical concerns (single turn/21ft)         Walk 150 feet activity   Assist Walk 150 feet activity did not occur: Safety/medical concerns         Walk 10 feet on uneven surface  activity   Assist Walk 10 feet on uneven surfaces activity did not occur: Safety/medical concerns         Wheelchair     Assist Will  patient use wheelchair at discharge?: No (not anticipated per PT long term goals)   Wheelchair activity did not occur: Safety/medical concerns (limited by decreased cognition and anxiety)         Wheelchair 50 feet with 2 turns activity    Assist    Wheelchair 50 feet with 2 turns activity did not occur: Safety/medical concerns       Wheelchair 150 feet activity     Assist  Wheelchair 150 feet activity did not occur: Safety/medical concerns       Blood  pressure 117/60, pulse 73, temperature 97.8 F (36.6 C), temperature source Axillary, resp. rate 18, height 5\' 7"  (1.702 m), weight 57 kg, SpO2 98 %.  Medical Problem List and Plan: 1.Progressive decrease in functional mobility, impaired cognition, apraxia with panic attacks generalized weakness and fallssecondary to acute metabolic encephalopathy/progressive dementiaof unknown etiology.  10/8-spoke with neuropsychology who felt that patient's presentation given exam and past history was most consistent with a functional neurological disorder. This makes sense given negative w/u by neurology and presentation which can fluctuate with anxiety levels. Also she is making generalized progress over the past week. Continue supportive and restorative measures as we are doing.  -  CJD pending -Continue Sinemet trial 25-100 mg 3 times daily -patient may shower  -ELOS/Goals: 14-16 days, min assist goals with PT, OT, SLP 2. Antithrombotics: -DVT/anticoagulation:Lovenox -antiplatelet therapy: N/A 3. Pain Management:Tylenolas needed 4. Mood:Zoloft 50 mg daily, Valium12.5mg  q12 scheduled -pt sees psychiatry as outpt -antipsychotic agents: N/A  -continue melatonin to assist with sleep, sleep chart  -pt/husband are aware of importance of regular sleep in her regimen  10/9- crying- but stable per nursing 5. Neuropsych: This patientis notcapable of making decisions on herown behalf. 6. Skin/Wound Care:Routine skin checks 7. Fluids/Electrolytes/Nutrition:encourage po    -resumed megace. Intake still touch and go. Increase to 800mg  bid per home regimen until po picks up   -drinking supplemental shakes  8. Hyper thyroidism. Hold Tapazole 5 mg 3 times daily per #9. continue Inderal 60 mg daily.   -Make referral to Dr. Buddy Duty for end of this month.  9. Elevated LFT's-  10/8 -Continued upward trend    -consulted with pharmacy and endocrine.  -in doing research, it appears that tapazole was started on 8/31 (at least that's the first time I see it written or mentioned). LFT's prior were normal and there has been a steady trend upward since  -will stop tapazole  -check hepatitis panel today  -follow LFT's serially  -recheck TFT's late next week  -reviewed plan with husband     LOS: 5 days A FACE TO FACE EVALUATION WAS PERFORMED  Ernisha Sorn 11/14/2019, 1:08 PM

## 2019-11-14 NOTE — Progress Notes (Signed)
Speech Language Pathology Daily Session Note  Patient Details  Name: Hannah Padilla MRN: [id number] Date of Birth: May 03, 1950  Today's Date: 11/14/2019 SLP Individual Time: 1015-1100 SLP Individual Time Calculation (min): 45 min  Short Term Goals: Week 1: SLP Short Term Goal 1 (Week 1): Pt will demonstrate basic problem solving skills by using call bell to request assistance with mod A multimodal cues. SLP Short Term Goal 2 (Week 1): Pt will demonstrate sustained attention in 3-5 minute intervals with max A verbal cues. SLP Short Term Goal 3 (Week 1): Pt will express wants/needs coherently with mod A verbal cues. SLP Short Term Goal 4 (Week 1): Pt will follow 1 step commands with 60% accuracy with max A multimodal cues. SLP Short Term Goal 5 (Week 1): Pt will increase vocal intensity to 70% intelligibility at the sentence level with max A verbal cues. SLP Short Term Goal 6 (Week 1): Pt will consume dys 3 textures and thin liquid diet with minimal overt s/s aspiration and mod A verbal cues for swallow strategies.  Skilled Therapeutic Interventions:   Patient seen for skilled ST session focusing on cognitive function goals. She was anxious and shaking with tremor in voice for entire session. (husband not here at this time which may have contributed to this anxiety). Patient perseverated on calling [name], her husband as well as wanting to use toilet. When asked if she wanted something to drink, she requested water. She tried to sip from the cup with the lid on and straw in lid which SLP then removed. After taking a couple sips she handed it back to SLP. With gelatin, she ate two bites after SLP setup and with SLP holding container and cueing her to use spoon. Vocal intensity varied from inaudible to soft and overall she required mod-max cues to increase vocal intensity. At times, she was muttering to herself and she did exhibit what appeared to be a visual hallucination, picking at her finger and saying  "there's no number on the back of this one". Patient performed 10 reps of EMST set at 16 resistance after SLP provided verbal cues and placed mouthpiece of device in her mouth, as she was not able to do so herself. Patient requested bathroom again and SLP directed her to find the call button. She did recall what she needed to do to call for help, but she did not have awareness to where call button was. She spoke at adequate vocal intensity to speak into microphone to request assistance. Patient continues to benefit from skilled SLP intervention to maximize cognitive-linguistic, speech, voice function prior to discharge.  Pain Pain Assessment Pain Scale: Faces Faces Pain Scale: No hurt  Therapy/Group: Individual Therapy  Sonia Baller, MA, CCC-SLP Speech Therapy

## 2019-11-14 NOTE — Progress Notes (Signed)
Occupational Therapy Session Note  Patient Details  Name: Hannah Padilla MRN: [id number] Date of Birth: October 24, 1950  Today's Date: 11/14/2019 OT Individual Time: 0903-1003 OT Individual Time Calculation (min): 60 min    Short Term Goals: Week 1:  OT Short Term Goal 1 (Week 1): Pt will complete UB dressing with mod assist. OT Short Term Goal 2 (Week 1): Pt will complete sit <>stand with mod assist in preperation for LB self care. OT Short Term Goal 3 (Week 1): Pt will complete oral hygiene sitting sinkside with min assist. OT Short Term Goal 4 (Week 1): Pt will bathe UB with mod assist in seated position.  Skilled Therapeutic Interventions/Progress Updates:    Session 1: ([id number])  Pt in bed to start session reporting eyes stinging.  Therapist provided emotional support and then also put in wetting drops to see if they helped.  She was given a washcloth and max instructional cueing to wipe her eyes. Once this was complete, she transferred to the EOB with max assist.  Slight increased posterior lean with rigidity noted once sitting.  She completed stand pivot transfer to the wheelchair with increased posterior lean noted and overall max assist.  She then worked on bathing and dressing at the sink.  Max instructional cueing needed to sequence task as well as mod hand over hand assist at times for initiation of motor movement to squeeze out the washcloth or to wash parts of her body.  She was able to assist some with UB bathing and washing of her upper legs, but with decreased attention and thoroughness.  She stood with max assist with BUEs supported and pulling up on the sink while therapist assisted with washing and drying front and back peri area as well as for new brief and pants.  She demonstrated increased anxiety with movements throughout session requiring emotional support from therapist.  Increased confusion also noted as pt was not oriented to place, time, or situation.  She needed max assist  overall for donning a pullover shirt.  Finished session with stand pivot transfer back to the bed at max assist level and return to supine with mod facilitation.  Pt left with soft touch call button in reach and bed alarm in place.      Session 2: ([id number])  Pt in bed with spouse present to start session.  She worked on toilet transfer during session secondary to report of needing to go to the bathroom.  She needed increased time and max assist for transfer from supine to sit EOB with rolling to the left side and for transitioning to sitting.  Once sitting she was able to maintain her balance with supervision.  She then completed sit to stand with max assist and for transfer stand pivot to the wide 3:1.  She demonstrated increased posterior lean and LOB throughout.  She needed +2 assist for managing clothing sit to stand with her spouse assisting.  She was unable to void however and completed transfer back to the bed.  Decreased ability to motor plan sit to stand or stepping with transfer.  Max demonstrational cueing needed.  Pt transitioned back to supine with mod assist to complete session.  Call button and phone in reach with her spouse present.  Encouraged him to have pt work on self feeding when he is present as well as having her reach items spontaneously as well as having her open items for him.  She was able to drink from a cup on the EOB  this session with min facilitation using the RUE.    Therapy Documentation Precautions:  Precautions Precautions: Fall Precaution Comments: anxious with mobility-resists posteriorly; tremors bilateral hands; watch skin integrity bilateral palms due to clenching fists intermittently Restrictions Weight Bearing Restrictions: No  Pain: Pain Assessment Pain Scale: Faces Pain Score: 0-No pain Faces Pain Scale: Hurts a little bit Pain Type: Acute pain Pain Location: Eye Pain Orientation: Right;Left Pain Descriptors / Indicators: Burning Pain Onset:  On-going Pain Intervention(s): Emotional support;Other (Comment) (eye drops) Multiple Pain Sites: No ADL: See Care Tool Section for some details of mobility and selfcare  Therapy/Group: Individual Therapy  Everette Dimauro OTR/L 11/14/2019, 12:16 PM

## 2019-11-15 MED ORDER — DRONABINOL 2.5 MG PO CAPS
2.5000 mg | ORAL_CAPSULE | Freq: Two times a day (BID) | ORAL | Status: DC
  Administered 2019-11-15 – 2019-12-01 (×33): 2.5 mg via ORAL
  Filled 2019-11-15 (×34): qty 1

## 2019-11-15 NOTE — Progress Notes (Signed)
[place] PHYSICAL MEDICINE & REHABILITATION PROGRESS NOTE   Subjective/Complaints:   Pt sitting up in bed- just finished 75% or more of breakfast with husband in room.   Says needs to void/have BM.   No PT/OT today per husband, pt continues to lose weight per husband- a few months ago, was 41- now is in 24s- hasn't been there since college.  Husband is asking if can try anything else for appetite.   Last Liver labs show ALT 395 (was 270) and AST 170 (was 185)- also total protein has decreased to 5.3  ROS: limited by cogntion  Objective:   No results found. No results for input(s): WBC, HGB, HCT, PLT in the last 72 hours. No results for input(s): NA, K, CL, CO2, GLUCOSE, BUN, CREATININE, CALCIUM in the last 72 hours.  Intake/Output Summary (Last 24 hours) at 11/15/2019 1233 Last data filed at 11/15/2019 0446 Gross per 24 hour  Intake 100 ml  Output 300 ml  Net -200 ml        Physical Exam: Vital Signs Blood pressure 112/70, pulse 64, temperature 98.5 F (36.9 C), temperature source Axillary, resp. rate 17, height 5\' 7"  (1.702 m), weight 57 kg, SpO2 98 %.  Constitutional: No distress . Sitting up in bed- smacking lips, brighter affect, no tears, husband in room, NAD HEENT: EOMI, oral membranes moist Neck: supple Cardiovascular: RRR    Respiratory/Chest: CTA B/L- no W/R/R- good air movement GI/Abdomen:  Soft, NT, ND, (+)BS  Ext: no clubbing, cyanosis, or edema Psych: flat, delayed- no change, but brighter Neuro: pt awake, appears fatigued. Very delayed with processing. Head nods to questions. Impaired decision making. Resting/intentional tremors BUE.  Musculoskeletal: no pain with PROM        Assessment/Plan: 1. Functional deficits secondary to acute, progressive dementia which require 3+ hours per day of interdisciplinary therapy in a comprehensive inpatient rehab setting.  Physiatrist is providing close team supervision and 24 hour management of active  medical problems listed below.  Physiatrist and rehab team continue to assess barriers to discharge/monitor patient progress toward functional and medical goals  Care Tool:  Bathing    Body parts bathed by patient: Chest, Abdomen, Left arm   Body parts bathed by helper: Right arm, Front perineal area, Buttocks, Right upper leg, Left upper leg, Right lower leg, Left lower leg     Bathing assist Assist Level: Maximal Assistance - Patient 24 - 49%     Upper Body Dressing/Undressing Upper body dressing   What is the patient wearing?: Pull over shirt    Upper body assist Assist Level: Maximal Assistance - Patient 25 - 49%    Lower Body Dressing/Undressing Lower body dressing      What is the patient wearing?: Underwear/pull up, Pants     Lower body assist Assist for lower body dressing: Dependent - Patient 0%     Toileting Toileting    Toileting assist Assist for toileting: 2 Helpers     Transfers Chair/bed transfer  Transfers assist     Chair/bed transfer assist level: Maximal Assistance - Patient 25 - 49%     Locomotion Ambulation   Ambulation assist   Ambulation activity did not occur: Safety/medical concerns (limited by anxiety and decreased strength/balance)  Assist level: 2 helpers Assistive device: Hand held assist Max distance: 50   Walk 10 feet activity   Assist  Walk 10 feet activity did not occur: Safety/medical concerns  Assist level: 2 helpers Assistive device: Hand held assist  Walk 50 feet activity   Assist Walk 50 feet with 2 turns activity did not occur: Safety/medical concerns (single turn/23ft)         Walk 150 feet activity   Assist Walk 150 feet activity did not occur: Safety/medical concerns         Walk 10 feet on uneven surface  activity   Assist Walk 10 feet on uneven surfaces activity did not occur: Safety/medical concerns         Wheelchair     Assist Will patient use wheelchair at discharge?:  No (not anticipated per PT long term goals)   Wheelchair activity did not occur: Safety/medical concerns (limited by decreased cognition and anxiety)         Wheelchair 50 feet with 2 turns activity    Assist    Wheelchair 50 feet with 2 turns activity did not occur: Safety/medical concerns       Wheelchair 150 feet activity     Assist  Wheelchair 150 feet activity did not occur: Safety/medical concerns       Blood pressure 112/70, pulse 64, temperature 98.5 F (36.9 C), temperature source Axillary, resp. rate 17, height 5\' 7"  (1.702 m), weight 57 kg, SpO2 98 %.  Medical Problem List and Plan: 1.Progressive decrease in functional mobility, impaired cognition, apraxia with panic attacks generalized weakness and fallssecondary to acute metabolic encephalopathy/progressive dementiaof unknown etiology.  10/8-spoke with neuropsychology who felt that patient's presentation given exam and past history was most consistent with a functional neurological disorder. This makes sense given negative w/u by neurology and presentation which can fluctuate with anxiety levels. Also she is making generalized progress over the past week. Continue supportive and restorative measures as we are doing.  -  CJD pending -Continue Sinemet trial 25-100 mg 3 times daily -patient may shower  -ELOS/Goals: 14-16 days, min assist goals with PT, OT, SLP 2. Antithrombotics: -DVT/anticoagulation:Lovenox -antiplatelet therapy: N/A 3. Pain Management:Tylenolas needed 4. Mood:Zoloft 50 mg daily, Valium12.5mg  q12 scheduled -pt sees psychiatry as outpt -antipsychotic agents: N/A  -continue melatonin to assist with sleep, sleep chart  -pt/husband are aware of importance of regular sleep in her regimen  10/9- crying- but stable per nursing  10/10- better today- no tears 5. Neuropsych: This patientis  notcapable of making decisions on herown behalf. 6. Skin/Wound Care:Routine skin checks 7. Fluids/Electrolytes/Nutrition:encourage po    -resumed megace. Intake still touch and go. Increase to 800mg  bid per home regimen until po picks up   -drinking supplemental shakes  8. Hyper thyroidism. Hold Tapazole 5 mg 3 times daily per #9. continue Inderal 60 mg daily.   -Make referral to Dr. Buddy Duty for end of this month.  9. Elevated LFT's-  10/8 -Continued upward trend   -consulted with pharmacy and endocrine.  -in doing research, it appears that tapazole was started on 8/31 (at least that's the first time I see it written or mentioned). LFT's prior were normal and there has been a steady trend upward since  -will stop tapazole  -check hepatitis panel today  -follow LFT's serially  -recheck TFT's late next week  -reviewed plan with husband  10/10- ALT up 395 from 270 but AST better 170 from 185- 11. Weight loss/malnutrition  10/10- will try ADDING Marinol- 2.5 mg BID- won't stop Megace just to see if it works better- also to see if has any Neuro side effects.  Total protein has decreased again to 5.3, of note.      LOS: 6 days A  FACE TO FACE EVALUATION WAS PERFORMED  Armaan Pond 11/15/2019, 12:33 PM

## 2019-11-16 ENCOUNTER — Inpatient Hospital Stay (HOSPITAL_COMMUNITY): Payer: Medicare Other | Admitting: Speech Pathology

## 2019-11-16 ENCOUNTER — Inpatient Hospital Stay (HOSPITAL_COMMUNITY): Payer: Medicare Other

## 2019-11-16 DIAGNOSIS — R627 Adult failure to thrive: Secondary | ICD-10-CM

## 2019-11-16 DIAGNOSIS — E46 Unspecified protein-calorie malnutrition: Secondary | ICD-10-CM

## 2019-11-16 DIAGNOSIS — E8809 Other disorders of plasma-protein metabolism, not elsewhere classified: Secondary | ICD-10-CM

## 2019-11-16 MED ORDER — PROSOURCE PLUS PO LIQD
30.0000 mL | Freq: Two times a day (BID) | ORAL | Status: DC
  Administered 2019-11-16 – 2019-12-03 (×31): 30 mL via ORAL
  Filled 2019-11-16 (×32): qty 30

## 2019-11-16 NOTE — Progress Notes (Signed)
Physical Therapy Session Note  Patient Details  Name: Hannah Padilla MRN: [id number] Date of Birth: 1950/12/02  Today's Date: 11/16/2019 PT Individual Time: 0804-0900 PT Individual Time Calculation (min): 56 min   Short Term Goals: Week 1:  PT Short Term Goal 1 (Week 1): Patient will perform bed mobility with max A of 1 person consistently. PT Short Term Goal 2 (Week 1): Patient will perform dynamic sitting balance with mod A. PT Short Term Goal 3 (Week 1): Patient will perform transfer with max A of 1 person.  Skilled Therapeutic Interventions/Progress Updates:     Patient in bed with her husband at [place] upon PT arrival. Patient alert and agreeable to PT session. Patient denied pain during session. Patient's husband reported that the patient had poor intake with breakfast this morning, stating she only ate some bites of applesauce. Patient requested to have a BM at beginning of session.   Therapeutic Activity: Bed Mobility: Patient performed supine to sit with min-mod A for LE and trunk support. Provided verbal cues and hand-over-hand assist for reaching to to bed rail to roll and to use her bottom UE to push to her elbow then her hand to come to sitting. Patient sat EOB >10 min with CGA-close supervision for sitting balance. RN provided morning medications with patient sitting EOB. Patient required mod cues for swallowing technique and taking small sips. Patient had one bout of choking using a straw with her water, improved when the straw was removed. RN made aware.  Transfers: Patient performed stand pivot bed<>BSC with max A of 1 with min-mod A of a second person for balance/support due to patient stepping her feet forward with hips and trunk remaining back in standing, improved with blocking LEs on second trial. She perofrmed sit to/from stand x1 with B HHA and mod A+2. Provided verbal cues for forward weight shift, B foot placement, sequencing for pivot, and hip and trunk extension in  standing for improved balance and reduced posterior bias. Patient was continent of bowl on BSC. Required total A for peri-care and LB dressing during toileting. Provided cues for truck rocking, diaphragmatic breathing, and relaxation to promote bowl motility while on BSC.   Gait Training:  Patient ambulated 6 feet using B HHA with mod A +2. Ambulated with shuffling gait and significant posterior bias that improved with repetition. Provided verbal cues for increased step length and weight shift, and increased hip extension and forward weight shift to bring her COM over her BOS.  Patient continues to present with increased anxiety with all mobility, increased confusion and disorientation, oriented to self only this session, and requires increased time for all mobility for initiation and anxiety management.   Patient in w/c with her husband in the room at end of session with breaks locked, seat belt alarm set, and all needs within reach.    Therapy Documentation Precautions:  Precautions Precautions: Fall Precaution Comments: anxious with mobility-resists posteriorly; tremors bilateral hands; watch skin integrity bilateral palms due to clenching fists intermittently Restrictions Weight Bearing Restrictions: No   Therapy/Group: Individual Therapy  Blanton Kardell L Nikolus Marczak PT, DPT  11/16/2019, 12:44 PM

## 2019-11-16 NOTE — Progress Notes (Signed)
Speech Language Pathology Weekly Progress and Session Note  Patient Details  Name: Hannah Padilla MRN: [id number] Date of Birth: 1950-07-27  Beginning of progress report period: November 09, 2019 End of progress report period: November 16, 2019  Today's Date: 11/16/2019 SLP Individual Time: 1000-1055 SLP Individual Time Calculation (min): 55 min  Short Term Goals: Week 1: SLP Short Term Goal 1 (Week 1): Pt will demonstrate basic problem solving skills by using call bell to request assistance with mod A multimodal cues. SLP Short Term Goal 1 - Progress (Week 1): Not met SLP Short Term Goal 2 (Week 1): Pt will demonstrate sustained attention in 3-5 minute intervals with max A verbal cues. SLP Short Term Goal 2 - Progress (Week 1): Met SLP Short Term Goal 3 (Week 1): Pt will express wants/needs coherently with mod A verbal cues. SLP Short Term Goal 3 - Progress (Week 1): Met SLP Short Term Goal 4 (Week 1): Pt will follow 1 step commands with 60% accuracy with max A multimodal cues. SLP Short Term Goal 4 - Progress (Week 1): Met SLP Short Term Goal 5 (Week 1): Pt will increase vocal intensity to 70% intelligibility at the sentence level with max A verbal cues. SLP Short Term Goal 5 - Progress (Week 1): Met SLP Short Term Goal 6 (Week 1): Pt will consume dys 3 textures and thin liquid diet with minimal overt s/s aspiration and mod A verbal cues for swallow strategies. SLP Short Term Goal 6 - Progress (Week 1): Met    New Short Term Goals: Week 2: SLP Short Term Goal 1 (Week 2): Patient will demonstrate sustained attention in 5 minute intervals with max A verbal cues. SLP Short Term Goal 2 (Week 2): Patient will demonstrate orientation to place and time with Max A multimodal cues. SLP Short Term Goal 3 (Week 2): Pt will demonstrate basic problem solving skills by using call bell to request assistance with max A multimodal cues. SLP Short Term Goal 4 (Week 2): Pt will consume dys 3 textures and  thin liquid diet with minimal overt s/s aspiration and min A verbal cues for use of swallow strategies. SLP Short Term Goal 5 (Week 2): Pt will increase vocal intensity to 75% intelligibility at the sentence level with max A verbal cues.  Weekly Progress Updates: Patient has made slow, inconsistent gains and has met 5 of 6 STGs this reporting period. Currently, patient is consuming Dys. 3 textures with thin liquids with minimal overt s/s of aspiration but requires Mod A verbal cues for use of swallowing compensatory strategies and Max encouragement for PO intake. Patient requires overall Max A multimodal cues for use of speech intelligibility strategies at the phrase and sentence level to achieve ~70% intelligibility. Patient also requires overall Max-Total A multimodal cues to complete functional and familiar tasks safely in regards to following commands, basic problem solving and sustained attention. Patient's overall function is impacted by anxiety which severely impacts her ability to participate effectively. Patient also demonstrates intermittent language of confusion. Patient and family education ongoing. Patient would benefit from continued skilled SLP intervention to maximize her speech, cognitive and swallowing function prior to discharge.      Intensity: Minumum of 1-2 x/day, 30 to 90 minutes Frequency: 3 to 5 out of 7 days Duration/Length of Stay: 3 weeks Treatment/Interventions: English as a second language teacher;Dysphagia/aspiration precaution training;Functional tasks;Cognitive remediation/compensation;Environmental controls;Internal/external aids;Patient/family education;Speech/Language facilitation;Therapeutic Activities   Daily Session  Skilled Therapeutic Interventions: Skilled treatment session focused on cognitive goals. SLP facilitated session by  providing Max A verbal and visual cues for problem solving while sequencing pictures from a field of 2.  Patient also required Max A verbal and visual  cues for sustained attention to task for ~5 minute intervals. Patient frequentuly becoming anxious and requesting to go home. Patient's husband present and provided emotional support. Patient's husband also asking clinician about other food options to maximize PO intake. SLP discussed a variety of options, patient's husband verbalized understanding. Patient left upright in bed with husband present. Continue with current plan of care.      Pain No/Denies Pain   Therapy/Group: Individual Therapy  Alyshia Kernan 11/16/2019, 6:40 AM

## 2019-11-16 NOTE — Progress Notes (Signed)
Slept most of night after scheduled valium and melatonin given. Husband at bedside, providing emotional support and hands on assistance. No void in 8 hours, I & O cath at 0153=300, amber urine with sediment noted. Patrici Ranks A

## 2019-11-16 NOTE — Consult Note (Signed)
Neuropsychological Consultation   Patient:   Hannah Padilla   DOB:   [birth year]  MR Number:  [id number]  Location:  [place] [id number] [place] [address] Dept: [place]: [phone]           Date of Service:   11/13/2019  Start Time:   10 AM End Time:   11 AM  Provider/Observer:  Ilean Skill, Psy.D.       Clinical Neuropsychologist       Billing Code/Service: 06237  Chief Complaint:    Hannah Padilla is a 69 year old female with history of hypothyroidism maintained on Tapazole.  Recurrent UTIs, [facility] spotted fever.  Patient is reported to have been functioning normally until early June 2021.  However, there is back medical records that suggest she had had some difficulty with panic events and anxiety going back to 2015 seen in her EMR with a visit to [place]ode, [place] in which the patient had been describing "shakes."  Panic and anxiety were noted.  Husband reported that these episodes typically happen on Mondays and that she is terrified of public speaking and gets very anxious when she is the subject of attention.  These difficulties went back to her school years.  In June 2021 the patient went to the emergency room with a panic attack and had 2 admissions in July with altered mental status and severe anxiety.  MRI was unremarkable.  Patient was tested for Lyme disease and [facility] spotted fever and patient was found to be positive for [facility] spotted fever and treated with antibiotics.  However, there was noted significant cognitive decline and increase in anxiety.  Patient developed shuffling gait, tremor with right hand, masking of face and significant problems with ambulation.  Patient did not initiate conversations or activities.  She deteriorated point that she required assist to standing up and required assistance with bathing and dressing and feeding.  She  developed incontinence of bowel and bladder.  There were no overt hallucinations.  Patient had a prior.  With hallucinations during a UTI infection.  There have been developing short-term memory issues.  There was rigidity in her arms and legs.  Before the development of these current difficulties the patient was healthy and able to complete her ADLs without assistance.  Recent MRI showed mild generalized cortical atrophy and scattered T2 flair hyperintensity foci predominantly in the subcortical and deep white matter of the hemispheres most consistent with chronic microvascular ischemic changes.  No acute abnormalities were noted.  EEG consistent with mild to moderate diffuse encephalopathy.  No seizures noted.  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 Presented 10/20/2019 with progressive decline with noted apraxia over the last 3 months as well as intermittent panic attacks generalized weakness and falls. Prior to onset of symptoms patient was healthy able to complete all of her ADLs without assistance. Most recent MRI showed mild generalized cortical atrophy stable compared to scan of 08/11/2019. Scattered T2 FLAIR hyperintense foci predominantly in the subcortical and deep white matter of the hemispheres most consistent with chronic microvascular ischemic change. No acute findings. EEG consistent with mild to moderate diffuse encephalopathy. No seizure. CT of the chest abdomen pelvis showed no evidence of primary metastatic disease. Admission chemistries potassium 3.4 total protein 6.4 AST 49 ALT 50 hemoglobin 14.7  SARS coronavirus negative, TSH 0.851, free T4 1.96, T3 total 73, T4 total 10.5 ammonia level 17, ESR of 2, paraneoplastic CSF study negative. Neurology follow-up suspect progressive dementia of unknown etiology. She did receive 5 days of IV steroids  during her hospital course as well as initiation of Sinemet for trial. She is on a mechanical soft diet thin liquids. Subcutaneous Lovenox for DVT prophylaxis. Thyroid function studies were reviewed by endocrinology Dr. Buddy Duty who recommends follow-up outpatient with ambulatory referral with recommendations to continue Tapazole as well as propranolol. Therapy evaluations completed and patient was admitted for a comprehensive rehab program.  Current Status:  Patient was very drowsy and slept for significant portions of the meeting today.  Her husband was present and answered most of the questions directly.  The patient's husband describes a significant acute worsening and the patient in June of this year and initially denied any significant anxiety or issues in the past but when discussed about the issues that were present in 2015 he acknowledged these difficulties with cueing and reports that the patient had difficulty with public speaking and anxiety and would get shaky and tremulous in the past.  When the patient was sleeping there were no tremors noted and when she did speak there were no tremors at times but as she was more oriented tremors began to become more visible today.  Behavioral Observation: Hannah Padilla  presents as a 69 y.o.-year-old Right Caucasian Female who appeared her stated age. her dress was Appropriate and she was Well Groomed and her manners were Appropriate to the situation.  her participation was indicative of Drowsy and Redirectable behaviors.  There were any physical disabilities noted.  she displayed an appropriate level of cooperation and motivation.     Interactions:    Minimal Drowsy  Attention:   abnormal and attention span appeared shorter than expected for age  Memory:   abnormal; global memory impairment noted  Visuo-spatial:  not examined  Speech (Volume):  low  Speech:   normal; slowed speech  Thought Process:  Circumstantial  Though Content:  WNL; not  suicidal and not homicidal  Orientation:   person and place  Judgment:   Poor  Planning:   Poor  Affect:    Anxious  Mood:    Anxious  Insight:   Shallow  Intelligence:   normal  Medical History:   Past Medical History:  Diagnosis Date  . Anxiety   . Colon polyp 2015  . Elevated serum free T4 level    husband report  . Farsightedness    wears glasses  . Mood change 2021   major change in affect, health issues with multiple ED visits, hospitalization, starting 07/10/2019  . [facility] spotted fever) 2021  . Urinary tract infection        Psychiatric History:  Patient does have a history of significant anxiety and panic events with shaking prior to June of this year.  However, significant exacerbation in her neurological status has developed and progressed.  Family Med/Psych History:  Family History  Problem Relation Age of Onset  . Alzheimer's disease Mother   . Other Father        cognitive decline  . COPD Father        smoker  . Protein S deficiency Other   . Heart disease Neg Hx   . Diabetes Neg Hx   . Cancer Neg Hx   . Stroke Neg Hx   . Hypertension Neg Hx   .  Hyperlipidemia Neg Hx    Impression/DX:  Hannah Padilla is a 69 year old female with history of hypothyroidism maintained on Tapazole.  Recurrent UTIs, [facility] spotted fever.  Patient is reported to have been functioning normally until early June 2021.  However, there is back medical records that suggest she had had some difficulty with panic events and anxiety going back to 2015 seen in her EMR with a visit to [place]ode, [place] in which the patient had been describing "shakes."  Panic and anxiety were noted.  Husband reported that these episodes typically happen on Mondays and that she is terrified of public speaking and gets very anxious when she is the subject of attention.  These difficulties went back to her school years.  In June 2021 the patient went to the emergency room with a panic attack  and had 2 admissions in July with altered mental status and severe anxiety.  MRI was unremarkable.  Patient was tested for Lyme disease and [facility] spotted fever and patient was found to be positive for [facility] spotted fever and treated with antibiotics.  However, there was noted significant cognitive decline and increase in anxiety.  Patient developed shuffling gait, tremor with right hand, masking of face and significant problems with ambulation.  Patient did not initiate conversations or activities.  She deteriorated point that she required assist to standing up and required assistance with bathing and dressing and feeding.  She developed incontinence of bowel and bladder.  There were no overt hallucinations.  Patient had a prior.  With hallucinations during a UTI infection.  There have been developing short-term memory issues.  There was rigidity in her arms and legs.  Before the development of these current difficulties the patient was healthy and able to complete her ADLs without assistance.  Recent MRI showed mild generalized cortical atrophy and scattered T2 flair hyperintensity foci predominantly in the subcortical and deep white matter of the hemispheres most consistent with chronic microvascular ischemic changes.  No acute abnormalities were noted.  EEG consistent with mild to moderate diffuse encephalopathy.  No seizures noted.  Patient was very drowsy and slept for significant portions of the meeting today.  Her husband was present and answered most of the questions directly.  The patient's husband describes a significant acute worsening and the patient in June of this year and initially denied any significant anxiety or issues in the past but when discussed about the issues that were present in 2015 he acknowledged these difficulties with cueing and reports that the patient had difficulty with public speaking and anxiety and would get shaky and tremulous in the past.  When the patient was  sleeping there were no tremors noted and when she did speak there were no tremors at times but as she was more oriented tremors began to become more visible today.  Disposition/Plan:  As far as diagnostic considerations, this does appear to be a multifactorial condition likely.  The patient does have a prior history of panic events, tremors and shakes during anxiety and significant anxiety symptoms particularly during social challenge and interaction.  The patient has been progressively worsening.  The patient was diagnosed with [facility] spotted fever as well as UTIs and has had significant neurological responses with prior UTIs including developing hallucinations and deteriorations.  The patient's husband reports that the patient's mother also had significant hallucinations and difficulties during UTI infections in the past.  The patient's MRI shows some age-related small vessel disease.  Cortical atrophy  but no acute changes.  Patient clearly deteriorated around the time of her [facility] spotted fever diagnosis and this bacterial infection and the neurological worsening and deficits clearly developed and worsened around this infection.  Her symptoms are consistent with [facility] spotted fever neurological involvement but these pre-existing functional neurological deficits and changes in conjunction with vulnerabilities historically likely are playing a role in the severity of her symptoms.  There is likely some degree of functional neurological disorder contributing to her severity of symptoms but clearly [facility] spotted fever has a significant component to the degree and severity of her illness with anxiety and underlying central nervous system reactivity to bacterial infection in general is present.  Diagnosis:    Metabolic encephalopathy - Plan: Ambulatory referral to Neurology         Electronically Signed   _______________________ Ilean Skill, Psy.D.

## 2019-11-16 NOTE — Progress Notes (Signed)
[place] PHYSICAL MEDICINE & REHABILITATION PROGRESS NOTE   Subjective/Complaints: Patient seen sitting up in bed this morning, being fed breakfast by husband.  Husband provides history due to cognition.  Husband states that she slept well overnight and she has not had any complaints.  Patient appears to agree that she slept well overnight.  ROS: limited by anxiety/behavior  Objective:   No results found. No results for input(s): WBC, HGB, HCT, PLT in the last 72 hours. No results for input(s): NA, K, CL, CO2, GLUCOSE, BUN, CREATININE, CALCIUM in the last 72 hours.  Intake/Output Summary (Last 24 hours) at 11/16/2019 0912 Last data filed at 11/16/2019 0153 Gross per 24 hour  Intake 358 ml  Output 675 ml  Net -317 ml        Physical Exam: Vital Signs Blood pressure 110/62, pulse 63, temperature 98.2 F (36.8 C), temperature source Oral, resp. rate 16, height 5\' 7"  (1.702 m), weight 57 kg, SpO2 99 %. Constitutional: No distress . Vital signs reviewed.  Frail. HENT: Normocephalic.  Atraumatic. Eyes: EOMI. No discharge. Cardiovascular: No JVD.  RRR. Respiratory: Normal effort.  No stridor.  Bilateral clear to auscultation. GI: Non-distended.  BS +. Skin: Warm and dry.  Intact. Psych: Delayed.  Slowed.  Blunted. Musc: No edema in extremities.  No tenderness in extremities. Neuro: Alert Generalized tremors Motor:?  4-4+/5 throughout  Assessment/Plan: 1. Functional deficits secondary to acute, progressive dementia which require 3+ hours per day of interdisciplinary therapy in a comprehensive inpatient rehab setting.  Physiatrist is providing close team supervision and 24 hour management of active medical problems listed below.  Physiatrist and rehab team continue to assess barriers to discharge/monitor patient progress toward functional and medical goals  Care Tool:  Bathing    Body parts bathed by patient: Chest, Abdomen, Left arm   Body parts bathed by helper: Right  arm, Front perineal area, Buttocks, Right upper leg, Left upper leg, Right lower leg, Left lower leg     Bathing assist Assist Level: Maximal Assistance - Patient 24 - 49%     Upper Body Dressing/Undressing Upper body dressing   What is the patient wearing?: Pull over shirt    Upper body assist Assist Level: Maximal Assistance - Patient 25 - 49%    Lower Body Dressing/Undressing Lower body dressing      What is the patient wearing?: Underwear/pull up, Pants     Lower body assist Assist for lower body dressing: Dependent - Patient 0%     Toileting Toileting    Toileting assist Assist for toileting: 2 Helpers     Transfers Chair/bed transfer  Transfers assist     Chair/bed transfer assist level: Maximal Assistance - Patient 25 - 49%     Locomotion Ambulation   Ambulation assist   Ambulation activity did not occur: Safety/medical concerns (limited by anxiety and decreased strength/balance)  Assist level: 2 helpers Assistive device: Hand held assist Max distance: 50   Walk 10 feet activity   Assist  Walk 10 feet activity did not occur: Safety/medical concerns  Assist level: 2 helpers Assistive device: Hand held assist   Walk 50 feet activity   Assist Walk 50 feet with 2 turns activity did not occur: Safety/medical concerns (single turn/71ft)         Walk 150 feet activity   Assist Walk 150 feet activity did not occur: Safety/medical concerns         Walk 10 feet on uneven surface  activity   Assist  Walk 10 feet on uneven surfaces activity did not occur: Safety/medical concerns         Wheelchair     Assist Will patient use wheelchair at discharge?: No (not anticipated per PT long term goals)   Wheelchair activity did not occur: Safety/medical concerns (limited by decreased cognition and anxiety)         Wheelchair 50 feet with 2 turns activity    Assist    Wheelchair 50 feet with 2 turns activity did not occur:  Safety/medical concerns       Wheelchair 150 feet activity     Assist  Wheelchair 150 feet activity did not occur: Safety/medical concerns       Blood pressure 110/62, pulse 63, temperature 98.2 F (36.8 C), temperature source Oral, resp. rate 16, height 5\' 7"  (1.702 m), weight 57 kg, SpO2 99 %.  Medical Problem List and Plan: 1.Progressive decrease in functional mobility, impaired cognition, apraxia with panic attacks generalized weakness and fallssecondary to acute metabolic encephalopathy/progressive dementiaof unknown etiology.  Symptoms most consistent with a functional neurological disorder. This makes sense given negative w/u by neurology and presentation which can fluctuate with anxiety levels. Also she is making generalized progress over the past week. Continue supportive and restorative measures as we are doing.  -Remains pending -Continue Sinemet trial 25-100 mg 3 times daily Continue CIR 2. Antithrombotics: -DVT/anticoagulation:Lovenox -antiplatelet therapy: N/A 3. Pain Management:Tylenolas needed 4. Mood:Zoloft 50 mg daily, Valium12.5mg  q12 scheduled -pt sees psychiatry as outpt -antipsychotic agents: N/A  -continue melatonin to assist with sleep, sleep chart  -pt/husband are aware of importance of regular sleep in her regimen 5. Neuropsych: This patientis notcapable of making decisions on herown behalf-history of dementia. 6. Skin/Wound Care:Routine skin checks 7. Fluids/Electrolytes/Nutrition:encourage po    -resumed megace. Intake still touch and go. Increased to 800mg  bid per home regimen until po picks up   -drinking supplemental shakes    P.o. intake?  Improving 8. Hyper thyroidism. Hold Tapazole 5 mg 3 times daily per #9. continue Inderal 60 mg daily.   -Make referral to Dr. Buddy Duty for end of this month.  9. Elevated LFT's-  LFTs continue to trend up on  10/9, labs ordered for tomorrow  -consulted with pharmacy and endocrine.  -?  Secondary to tapazole, DC'd  11. Weight loss/malnutrition/failure to thrive  Added Marinol- 2.5 mg BID- won't stop Megace just to see if it works better  See #7 12.  Hypoalbuminemia  Supplement initiated on 10/11  LOS: 7 days A FACE TO FACE EVALUATION WAS PERFORMED  Zeynab Klett Lorie Phenix 11/16/2019, 9:12 AM

## 2019-11-16 NOTE — Progress Notes (Signed)
Occupational Therapy Session Note  Patient Details  Name: Hannah Padilla MRN: [id number] Date of Birth: 17-Aug-1950  Today's Date: 11/16/2019 OT Individual Time: 1300-1400 OT Individual Time Calculation (min): 60 min    Short Term Goals: Week 1:  OT Short Term Goal 1 (Week 1): Pt will complete UB dressing with mod assist. OT Short Term Goal 2 (Week 1): Pt will complete sit <>stand with mod assist in preperation for LB self care. OT Short Term Goal 3 (Week 1): Pt will complete oral hygiene sitting sinkside with min assist. OT Short Term Goal 4 (Week 1): Pt will bathe UB with mod assist in seated position.  Skilled Therapeutic Interventions/Progress Updates:    Pt received sitting up in bed with her husband, [name], present, coaching her to increase fluids. Pt completed several sips of water with max cueing for attention to task, reduction of anxiety, and swallow. Pt completed bed mobility to EOB with max cueing and mod A overall. Pt completed stand pivot transfer to the [facility] with max A, max cueing from OT and husband. Pt sat and voided BM with max cueing from her husband. She stood with anterior support from [name] while OT completed total A peri hygiene and clothing management. W/c was placed behind her to sit into with max cueing for initiation. Pt sat at the sink while OT completed hair washing to increase psychosocial adjustment and initiate more realistic daily routine. Pt was turned to face the sink and with copious amounts of time and max multimodal cueing for initiation/sequencing, pt brushed her hair with mod A overall. Pt then completed oral hygiene with slightly improved initiation. Pt was left sitting up in the w/c with her husband present, [name] was advised to ask nursing staff for assistance with transfers.    Therapy Documentation Precautions:  Precautions Precautions: Fall Precaution Comments: anxious with mobility-resists posteriorly; tremors bilateral hands; watch skin integrity  bilateral palms due to clenching fists intermittently Restrictions Weight Bearing Restrictions: No   Therapy/Group: Individual Therapy  Curtis Sites 11/16/2019, 6:55 AM

## 2019-11-17 ENCOUNTER — Inpatient Hospital Stay (HOSPITAL_COMMUNITY): Payer: Medicare Other | Admitting: Physical Therapy

## 2019-11-17 ENCOUNTER — Inpatient Hospital Stay (HOSPITAL_COMMUNITY): Payer: Medicare Other

## 2019-11-17 LAB — CBC WITH DIFFERENTIAL/PLATELET
Abs Immature Granulocytes: 0.05 10*3/uL (ref 0.00–0.07)
Basophils Absolute: 0 10*3/uL (ref 0.0–0.1)
Basophils Relative: 1 %
Eosinophils Absolute: 0.2 10*3/uL (ref 0.0–0.5)
Eosinophils Relative: 4 %
HCT: 40.9 % (ref 36.0–46.0)
Hemoglobin: 13.3 g/dL (ref 12.0–15.0)
Immature Granulocytes: 1 %
Lymphocytes Relative: 34 %
Lymphs Abs: 1.8 10*3/uL (ref 0.7–4.0)
MCH: 32 pg (ref 26.0–34.0)
MCHC: 32.5 g/dL (ref 30.0–36.0)
MCV: 98.6 fL (ref 80.0–100.0)
Monocytes Absolute: 0.4 10*3/uL (ref 0.1–1.0)
Monocytes Relative: 8 %
Neutro Abs: 2.9 10*3/uL (ref 1.7–7.7)
Neutrophils Relative %: 52 %
Platelets: 270 10*3/uL (ref 150–400)
RBC: 4.15 MIL/uL (ref 3.87–5.11)
RDW: 16.2 % — ABNORMAL HIGH (ref 11.5–15.5)
WBC: 5.4 10*3/uL (ref 4.0–10.5)
nRBC: 0 % (ref 0.0–0.2)

## 2019-11-17 LAB — COMPREHENSIVE METABOLIC PANEL
ALT: 87 U/L — ABNORMAL HIGH (ref 0–44)
AST: 108 U/L — ABNORMAL HIGH (ref 15–41)
Albumin: 2.9 g/dL — ABNORMAL LOW (ref 3.5–5.0)
Alkaline Phosphatase: 358 U/L — ABNORMAL HIGH (ref 38–126)
Anion gap: 6 (ref 5–15)
BUN: 11 mg/dL (ref 8–23)
CO2: 25 mmol/L (ref 22–32)
Calcium: 9.2 mg/dL (ref 8.9–10.3)
Chloride: 108 mmol/L (ref 98–111)
Creatinine, Ser: 0.64 mg/dL (ref 0.44–1.00)
GFR, Estimated: >60 mL/min (ref >60)
Glucose, Bld: 100 mg/dL — ABNORMAL HIGH (ref 70–99)
Potassium: 3.5 mmol/L (ref 3.5–5.1)
Sodium: 139 mmol/L (ref 135–145)
Total Bilirubin: 1.5 mg/dL — ABNORMAL HIGH (ref 0.3–1.2)
Total Protein: 5.3 g/dL — ABNORMAL LOW (ref 6.5–8.1)

## 2019-11-17 NOTE — Progress Notes (Signed)
Physical Therapy Weekly Progress Note  Patient Details  Name: Hannah Padilla MRN: [id number] Date of Birth: 07-05-1950  Beginning of progress report period: November 10, 2019 End of progress report period: November 17, 2019  Today's Date: 11/17/2019 PT Individual Time: 1345-1430 PT Individual Time Calculation (min): 45 min   Patient has met 3 of 3 short term goals.  Patient with slow, but steady progress this week. She currently requires max-mod A for bed mobility, mod +2 or max of 1 person for transfers, and mod-max A +2 with gait using B HHA x30 ft.  Patient continues to demonstrate the following deficits muscle weakness and muscle joint tightness, decreased cardiorespiratoy endurance, impaired timing and sequencing, decreased coordination and decreased motor planning, decreased midline orientation, decreased attention to left and decreased motor planning, decreased initiation, decreased attention, decreased awareness, decreased problem solving, decreased safety awareness, decreased memory and delayed processing and decreased sitting balance, decreased standing balance, decreased postural control and decreased balance strategies and therefore will continue to benefit from skilled PT intervention to increase functional independence with mobility.  Patient progressing toward long term goals..  Continue plan of care.  PT Short Term Goals Week 1:  PT Short Term Goal 1 (Week 1): Patient will perform bed mobility with max A of 1 person consistently. PT Short Term Goal 1 - Progress (Week 1): Met PT Short Term Goal 2 (Week 1): Patient will perform dynamic sitting balance with mod A. PT Short Term Goal 2 - Progress (Week 1): Met PT Short Term Goal 3 (Week 1): Patient will perform transfer with max A of 1 person. PT Short Term Goal 3 - Progress (Week 1): Met Week 2:  PT Short Term Goal 1 (Week 2): Patient will perform dynamic sitting balance with CGA. PT Short Term Goal 2 (Week 2): Patient will perform  basic transfers with mod A of 1 person. PT Short Term Goal 3 (Week 2): Patient will ambulate 10 feet with max A of 1 person using LRAD.  Skilled Therapeutic Interventions/Progress Updates:     Patient in bed asleep upon PT arrival. Patient easily aroused and responded to verbal stimulation, however did not open her eyes. Patient denied pain during session. Patient remained with her eyes closed. Initially confused and anxious about where she was, unable to provide correct responses to orientation questions. Reoriented patient to setting, situation, and time. Patient with untouched lunch tray in her room. PT offerred to assist patient with eating, patient firmly declined, offered alternative dysphasia 3 appropriate options (Ensure, Boost, ice cream, etc.), patient firmly declined all offers.   Assisted patient to sitting EOB, required increased time for initiation due to delayed processing and decreased awareness. Performed supine to sit with mod-max A with HOB elevated and use of bed rail. Patient with posterior lean in sitting requiring mod A for balance, progressed to min A with intermittent CGA over 5 min in sitting. Patient required max cues for bringing hands to knees to reduce posterior lean. Patient very anxious, required several cues to open her eyes and allow PT to provide [facility] assist for hand placement. Patient reported increased fatigue and asked to be left alone. Provided encouragement for increased mobility to improve strength and function to reach patient's goal to go home. Patient continued to ask to be left alone. Returned to supine with max A for trunk and LE management and required total A for scooting up in the bed, patient did not participate in this despite cues and facilitation for LE placement  to push herself up in the bed. Patient asleep once in lying. Patient missed 30 min of skilled PT due to fatigue, RN made aware. Will attempt to make-up missed time as able.  Patient in bed asleep  at end of session with breaks locked, bed alarm set, and all needs within reach.    Therapy Documentation Precautions:  Precautions Precautions: Fall Precaution Comments: anxious with mobility-resists posteriorly; tremors bilateral hands; watch skin integrity bilateral palms due to clenching fists intermittently Restrictions Weight Bearing Restrictions: No General: PT Amount of Missed Time (min): 30 Minutes PT Missed Treatment Reason: Patient fatigue   Therapy/Group: Individual Therapy  Zeffie Bickert L Terryl Molinelli PT, DPT  11/17/2019, 5:41 PM

## 2019-11-17 NOTE — Progress Notes (Signed)
Speech Language Pathology Daily Session Note  Patient Details  Name: Hannah Padilla MRN: [id number] Date of Birth: [birth year]  Today's Date: 11/17/2019 SLP Individual Time: 0905-1002 SLP Individual Time Calculation (min): 57 min  Short Term Goals: Week 2: SLP Short Term Goal 1 (Week 2): Patient will demonstrate sustained attention in 5 minute intervals with max A verbal cues. SLP Short Term Goal 2 (Week 2): Patient will demonstrate orientation to place and time with Max A multimodal cues. SLP Short Term Goal 3 (Week 2): Pt will demonstrate basic problem solving skills by using call bell to request assistance with max A multimodal cues. SLP Short Term Goal 4 (Week 2): Pt will consume dys 3 textures and thin liquid diet with minimal overt s/s aspiration and min A verbal cues for use of swallow strategies. SLP Short Term Goal 5 (Week 2): Pt will increase vocal intensity to 75% intelligibility at the sentence Padilla with max A verbal cues.  Skilled Therapeutic Interventions:Skilled ST services focused on cognitive skills. Pt's husband, Hannah Padilla was present for treatment session. Pt was orientated to place and time with total A reducing cuing to max A multimodal cues for use of visual aids posted in room. Pt required continuous reassurance and breaks while attempted to adjust pt in bed to sitting position due to anxiety. SLP facilitated basic problem solving and sustained attention skills in basic card sorting task with two colors. Pt demonstrated ability to problem solve sorting by color with min A verbal cues, required total A to turn cards face up and sustained attention in 10 minute interval with max A verbal cues to continue task with encouragement. Pt intermittently expressed desire to use the bathroom, but was unwilling to use bedside commode or brief. Pt eventually agreed to use the bedside commode. SLP and [name] assisted with transfer Hannah Padilla held her tightly against his body as she shuffled to commode)  and pt was unable to void since she had already voided in brief. SLP assisted with donning new brief and transferring back to bed. Writer last saw pt on evaluation date, pt demonstrated overall increase periods of clarity and reduced anxeity verse upon evaluation. Pt was left in room with call bell within reach and bed alarm set. SLP recommends to continue skilled services.     Pain Pain Assessment Pain Score: 0-No pain  Therapy/Group: Individual Therapy  Hannah Padilla  [facility] 11/17/2019, 11:34 AM

## 2019-11-17 NOTE — Patient Care Conference (Signed)
Inpatient RehabilitationTeam Conference and Plan of Care Update Date: 11/17/2019   Time: 10:08 AM    Patient Name: Hannah Padilla      Medical Record Number: [id number]  Date of Birth: 12/24/1950 Sex: Female         Room/Bed: 4W03C/4W03C-01 Payor Info: Payor: [employer] MEDICARE / Plan: [employer] MEDICARE / Product Type: *No Product type* /    Admit Date/Time:  11/09/2019  4:43 PM  Primary Diagnosis:  Progressive dementia with uncertain etiology [facility])  Hospital Problems: Principal Problem:   Progressive dementia with uncertain etiology ([place]) Active Problems:   Acute metabolic encephalopathy   Anxiety disorder due to general medical condition   Hypoalbuminemia due to protein-calorie malnutrition ([place])   Failure to thrive in adult    Expected Discharge Date: Expected Discharge Date: 12/01/19  Team Members Present: Physician leading conference: Dr. Leeroy Cha Care Coodinator Present: Loralee Pacas, LCSWA;Anselm Aumiller Creig Hines, RN, BSN, [place] Nurse Present: Other (comment) Hyacinth Meeker, RN) PT Present: Apolinar Junes, PT OT Present: Laverle Hobby, OT SLP Present: Weston Anna, SLP PPS Coordinator present : Ileana Ladd, Burna Mortimer, SLP     Current Status/Progress Goal Weekly Team Focus  Bowel/Bladder   Continent and incontinent @ times  Will toilet pt q shift  Assess pt for incontinence q shift and as needed.   Swallow/Nutrition/ Hydration   Dys. 3 textures with thin liquids, Mod A  Min A  encouragement for PO intake, use of swallowing strategies   ADL's   max A ADLs, total A toileting, max A ADL transfers. Very anxious, very confused. Very supportive husband  mod A overall  initiation, sequencing, ADL retraining, OOB tolerance, engagement in therapy, family edu   Mobility   Max-mod A bed mobility, max A transfers, mod-max +2 gait 30 ft  Mod A overall  Activity tolerance, balance, midline orientation, motor planning, cognitive remediation, functional mobility,  strength/ROM, gait training, patient/caregiver education   Communication   Mod-Max A  Min-Mod A  use of speech intelligibility strategies, EMST   Safety/Cognition/ Behavioral Observations  Max-Total A  Min A  sustianed attention, basic problem solving   Pain   No C/O pain  Pt will remain pain free while at the hospital  Assess for pain q shift or as needed.   Skin   Skin is intact no rash or breakdown noted.  Skin will remain free from breakdown  Assess skin skin q shift or as needed     Discharge Planning:  D/c to home with 24/7 care from husband.   Team Discussion: Empties bladder better when up to the toilet and sitting upright. OT reports max assist with ADL's, max to total assist cueing, total assist toileting. PT reports poor tolerance, ambulated up to 30', downgrading goals to mod assist. SLP reports working on sustained attention, and basic problem solving with min assist goals. Patient on target to meet rehab goals: yes, slow but steady progress.  *See Care Plan and progress notes for long and short-term goals.   Revisions to Treatment Plan:  PT to downgrade goals.  Teaching Needs: Continue family education with husband.  Current Barriers to Discharge: Incontinence, Lack of/limited family support, Medication compliance and Behavior  Possible Resolutions to Barriers: Continue current medications, continue family education, and timed toileting schedule.     Medical Summary Current Status: Incontinent but does void, last BM 10/11, still with poor appetite- started on Megace, LFTs trending downwards, albumin low  Barriers to Discharge: Behavior;Medication compliance  Barriers to Discharge Comments:  Anxious, confused, poor appetite, elevated LFTs, urinary incontinence Possible Resolutions to [employer] Focus: Monitor LFTs weekly, monitor albumin and continue Megace trial, albumin supplement started, caregiver training to improve po intake, bladder program   Continued  Need for Acute Rehabilitation Level of Care: The patient requires daily medical management by a physician with specialized training in physical medicine and rehabilitation for the following reasons: Direction of a multidisciplinary physical rehabilitation program to maximize functional independence : Yes Medical management of patient stability for increased activity during participation in an intensive rehabilitation regime.: Yes Analysis of laboratory values and/or radiology reports with any subsequent need for medication adjustment and/or medical intervention. : Yes   I attest that I was present, lead the team conference, and concur with the assessment and plan of the team.   Cristi Loron 11/17/2019, 1:36 PM

## 2019-11-17 NOTE — Progress Notes (Signed)
[facility] PHYSICAL MEDICINE & REHABILITATION PROGRESS NOTE   Subjective/Complaints: Husband feeding patient breakfast. He says she is participating well in therapy and this has helped with her sleep as well- her sleep is more restful and she is no longer waking in the middle of night.  ROS: limited by anxiety/behavior  Objective:   No results found. Recent Labs    11/17/19 0513  WBC 5.4  HGB 13.3  HCT 40.9  PLT 270   Recent Labs    11/17/19 0513  NA 139  K 3.5  CL 108  CO2 25  GLUCOSE 100*  BUN 11  CREATININE 0.64  CALCIUM 9.2    Intake/Output Summary (Last 24 hours) at 11/17/2019 0843 Last data filed at 11/16/2019 1740 Gross per 24 hour  Intake 265 ml  Output 300 ml  Net -35 ml        Physical Exam: Vital Signs Blood pressure 114/65, pulse 65, temperature 98 F (36.7 C), temperature source Oral, resp. rate 18, height 5\' 7"  (1.702 m), weight 57 kg, SpO2 98 %. General: Alert, No apparent distress HEENT: Head is normocephalic, atraumatic, PERRLA, EOMI, sclera anicteric, oral mucosa pink and moist, dentition intact, ext ear canals clear,  Neck: Supple without JVD or lymphadenopathy Heart: Reg rate and rhythm. No murmurs rubs or gallops Chest: CTA bilaterally without wheezes, rales, or rhonchi; no distress GI: Non-distended.  BS +. Skin: Warm and dry.  Intact. Psych: Delayed.  Slowed.  Blunted. Musc: No edema in extremities.  No tenderness in extremities. Neuro: Alert Generalized tremors Motor:?  4-4+/5 throughout   Assessment/Plan: 1. Functional deficits secondary to acute, progressive dementia which require 3+ hours per day of interdisciplinary therapy in a comprehensive inpatient rehab setting.  Physiatrist is providing close team supervision and 24 hour management of active medical problems listed below.  Physiatrist and rehab team continue to assess barriers to discharge/monitor patient progress toward functional and medical goals  Care  Tool:  Bathing    Body parts bathed by patient: Chest, Abdomen, Left arm   Body parts bathed by helper: Right arm, Front perineal area, Buttocks, Right upper leg, Left upper leg, Right lower leg, Left lower leg     Bathing assist Assist Level: Maximal Assistance - Patient 24 - 49%     Upper Body Dressing/Undressing Upper body dressing   What is the patient wearing?: Pull over shirt    Upper body assist Assist Level: Maximal Assistance - Patient 25 - 49%    Lower Body Dressing/Undressing Lower body dressing      What is the patient wearing?: Underwear/pull up, Pants     Lower body assist Assist for lower body dressing: Dependent - Patient 0%     Toileting Toileting    Toileting assist Assist for toileting: 2 Helpers     Transfers Chair/bed transfer  Transfers assist     Chair/bed transfer assist level: Maximal Assistance - Patient 25 - 49%     Locomotion Ambulation   Ambulation assist   Ambulation activity did not occur: Safety/medical concerns (limited by anxiety and decreased strength/balance)  Assist level: 2 helpers Assistive device: Hand held assist Max distance: 50   Walk 10 feet activity   Assist  Walk 10 feet activity did not occur: Safety/medical concerns  Assist level: 2 helpers Assistive device: Hand held assist   Walk 50 feet activity   Assist Walk 50 feet with 2 turns activity did not occur: Safety/medical concerns (single turn/14ft)  Walk 150 feet activity   Assist Walk 150 feet activity did not occur: Safety/medical concerns         Walk 10 feet on uneven surface  activity   Assist Walk 10 feet on uneven surfaces activity did not occur: Safety/medical concerns         Wheelchair     Assist Will patient use wheelchair at discharge?: No (not anticipated per PT long term goals)   Wheelchair activity did not occur: Safety/medical concerns (limited by decreased cognition and anxiety)          Wheelchair 50 feet with 2 turns activity    Assist    Wheelchair 50 feet with 2 turns activity did not occur: Safety/medical concerns       Wheelchair 150 feet activity     Assist  Wheelchair 150 feet activity did not occur: Safety/medical concerns       Blood pressure 114/65, pulse 65, temperature 98 F (36.7 C), temperature source Oral, resp. rate 18, height 5\' 7"  (1.702 m), weight 57 kg, SpO2 98 %.  Medical Problem List and Plan: 1.Progressive decrease in functional mobility, impaired cognition, apraxia with panic attacks generalized weakness and fallssecondary to acute metabolic encephalopathy/progressive dementiaof unknown etiology.  Symptoms most consistent with a functional neurological disorder. This makes sense given negative w/u by neurology and presentation which can fluctuate with anxiety levels. Also she is making generalized progress over the past week. Continue supportive and restorative measures as we are doing.  -Remains pending -Continue Sinemet trial 25-100 mg 3 times daily Continue CIR  -Interdisciplinary Team Conference today  2. Antithrombotics: -DVT/anticoagulation:Lovenox -antiplatelet therapy: N/A 3. Pain Management:Tylenolas needed. Well controlled 4. Mood:Zoloft 50 mg daily, Valium12.5mg  q12 scheduled -pt sees psychiatry as outpt -antipsychotic agents: N/A  -continue melatonin to assist with sleep, sleep chart  -pt/husband are aware of importance of regular sleep in her regimen 5. Neuropsych: This patientis notcapable of making decisions on herown behalf-history of dementia. 6. Skin/Wound Care:Routine skin checks 7. Fluids/Electrolytes/Nutrition:encourage po    -resumed megace. Intake still touch and go. Increased to 800mg  bid per home regimen until po picks up   -drinking supplemental shakes    P.o. intake?  Improving 8. Hyper  thyroidism. Hold Tapazole 5 mg 3 times daily per #9. continue Inderal 60 mg daily.   -Make referral to Dr. Buddy Duty for end of this month.  9. Elevated LFT's-  LFTs trending downward on 10/12.  -consulted with pharmacy and endocrine.  -?  Secondary to tapazole, DC'd  11. Weight loss/malnutrition/failure to thrive  Added Marinol- 2.5 mg BID- won't stop Megace just to see if it works better  See #7 12.  Hypoalbuminemia  Supplement initiated on 10/11  Albumin 2.9 on 10/12  LOS: 8 days A FACE TO FACE EVALUATION WAS PERFORMED  Clide Deutscher Malashia Kamaka 11/17/2019, 8:43 AM

## 2019-11-17 NOTE — Progress Notes (Signed)
Patient ID: Hannah Padilla, female   DOB: 10/31/1950, 69 y.o.   MRN: [id number]  SW met with pt in room to provide updates from team conference, and d/c date 10/26. SW spoke with pt husband [name] ([phone]) to provide updates and informed there will continue to be updates after team conference.    , MSW, LCSWA Office: [phone] Cell: [phone] Fax: [phone] 

## 2019-11-17 NOTE — Progress Notes (Signed)
Occupational Therapy Weekly Progress Note  Patient Details  Name: Hannah Padilla MRN: [id number] Date of Birth: [birth year]  Beginning of progress report period: November 10, 2019 End of progress report period: November 17, 2019  Today's Date: 11/17/2019 OT Individual Time: 1100-1200 OT Individual Time Calculation (min): 60 min    Patient has met 2 of 4 short term goals.  Pt has made fair progress during her first week in rehab. Overall she is functionally at a max-total assist level for ADLs and transfers and requires heavy multimodal cueing. Pt is severely limited by anxiety and cognitive deficits. Her husband, [name], is present often and very involved/supportive. Education is taking place daily for d/c planning.   Patient continues to demonstrate the following deficits: muscle weakness, impaired timing and sequencing, abnormal tone, unbalanced muscle activation, decreased coordination and decreased motor planning, decreased motor planning, decreased initiation, decreased attention, decreased awareness, decreased problem solving, decreased safety awareness, decreased memory and delayed processing and decreased sitting balance, decreased standing balance and decreased postural control and therefore will continue to benefit from skilled OT intervention to enhance overall performance with BADL and Reduce care partner burden.  Patient progressing toward long term goals..  Continue plan of care.  OT Short Term Goals Week 1:  OT Short Term Goal 1 (Week 1): Pt will complete UB dressing with mod assist. OT Short Term Goal 1 - Progress (Week 1): Not met OT Short Term Goal 2 (Week 1): Pt will complete sit <>stand with mod assist in preperation for LB self care. OT Short Term Goal 2 - Progress (Week 1): Not met OT Short Term Goal 3 (Week 1): Pt will complete oral hygiene sitting sinkside with min assist. OT Short Term Goal 3 - Progress (Week 1): Met OT Short Term Goal 4 (Week 1): Pt will bathe UB with mod  assist in seated position. OT Short Term Goal 4 - Progress (Week 1): Met Week 2:  OT Short Term Goal 1 (Week 2): Pt will don shirt with mod A and mod cueing OT Short Term Goal 2 (Week 2): Pt will initiate ADL tasks with mod cueing OT Short Term Goal 3 (Week 2): Pt will complete toilet transfer with mod A  Skilled Therapeutic Interventions/Progress Updates:    Pt received supine with her husband [name] present, no c/o pain at rest. Pt visibly anxious and clenching fists. Pt completed bed mobility to EOB with max cueing provided by [name] and mod A overall. Pt completed stand pivot transfer to the w/c with max A overall. Very narrow BOS and poor B weightbearing- often standing on one leg. Pt was taken to the sink for ADLs. Provided edu/demo re cueing and slow processing to [name]- he is very motivated to help and participate. Pt completed oral care with min A overall. UB bathing completed with mod A and max cueing for initiation, sequencing, and follow through. Much improved initiation from yesterday's session-pt seems to do much better with simple, direct commands vs step by step cueing. Pt then reported need to use the bathroom. She was taken into the bathroom with w/c and she completed a stand pivot transfer with mod A- big improvement! Pt required total A for all toileting tasks. Pt then completed 10 ft of in room functional mobility to simulate home distance/bathroom transfers, with mod A +2 assist. Cueing required for widening BOS and sequencing. Pt was left sitting in the loveseat with her husband, who was instructed to remain present and get nursing assistance for any  transfers.   Therapy Documentation Precautions:  Precautions Precautions: Fall Precaution Comments: anxious with mobility-resists posteriorly; tremors bilateral hands; watch skin integrity bilateral palms due to clenching fists intermittently Restrictions Weight Bearing Restrictions: No  Therapy/Group: Individual Therapy  Curtis Sites 11/17/2019, 12:17 PM

## 2019-11-17 NOTE — Progress Notes (Signed)
Physical Therapy Note  Patient Details  Name: Hannah Padilla MRN: [id number] Date of Birth: 13-Oct-1950 Today's Date: 11/17/2019    Attempted to see patient for scheduled therapy session. Pt supine in bed and disoriented. Attempt to reorient patient to location and she becomes even more distressed. Pt able to be calmed down but declines any OOB mobility or sitting up to EOB this date. Pt missed 30 min of scheduled therapy session due to refusal. Will follow up per POC.    Excell Seltzer, PT, DPT  11/17/2019, 4:48 PM

## 2019-11-18 ENCOUNTER — Inpatient Hospital Stay (HOSPITAL_COMMUNITY): Payer: Medicare Other | Admitting: Speech Pathology

## 2019-11-18 ENCOUNTER — Inpatient Hospital Stay (HOSPITAL_COMMUNITY): Payer: Medicare Other

## 2019-11-18 MED ORDER — PRAZOSIN HCL 1 MG PO CAPS
1.0000 mg | ORAL_CAPSULE | Freq: Every day | ORAL | Status: DC | PRN
  Filled 2019-11-18: qty 1

## 2019-11-18 NOTE — Progress Notes (Signed)
Occupational Therapy Session Note  Patient Details  Name: Hannah Padilla MRN: [id number] Date of Birth: 03-17-1950  Today's Date: 11/18/2019 OT Individual Time: 7158-0638 OT Individual Time Calculation (min): 75 min    Short Term Goals: Week 1:  OT Short Term Goal 1 (Week 1): Pt will complete UB dressing with mod assist. OT Short Term Goal 1 - Progress (Week 1): Not met OT Short Term Goal 2 (Week 1): Pt will complete sit <>stand with mod assist in preperation for LB self care. OT Short Term Goal 2 - Progress (Week 1): Not met OT Short Term Goal 3 (Week 1): Pt will complete oral hygiene sitting sinkside with min assist. OT Short Term Goal 3 - Progress (Week 1): Met OT Short Term Goal 4 (Week 1): Pt will bathe UB with mod assist in seated position. OT Short Term Goal 4 - Progress (Week 1): Met  Skilled Therapeutic Interventions/Progress Updates:    1;1. Pt received in bed agreeable to OT. Pt initialy oriented to place, year, month and self, however once midway through session pt reports, "im scared I dont know how to swim." gentle reorientation throughout. Pt completes stand pivot transfer with MAX A of 1 EOB<>w/c<>BSC over toilet. Total A for CM. Pt uanble to void despite increased time. Pt does better wth direct VC for end goal (I.e., stand up, sit down, scoot forward). Pt sits at table top reaching forward to obtain, place then remove clothes pins in pattern order with HOH A 60% of time for initiating reaching and anterior trunk flexion. Pt able ot accurately state which color comes next, however when selectin from bucket unable to obtain the correct color. Back in room pt places bean bags on w/c mod ranges outside BOS to improve anterior weigth shift in prep for sit to stand. Exited session with pt seated in bed, exit alarm on and call light in reach  Therapy Documentation Precautions:  Precautions Precautions: Fall Precaution Comments: anxious with mobility-resists posteriorly; tremors  bilateral hands; watch skin integrity bilateral palms due to clenching fists intermittently Restrictions Weight Bearing Restrictions: No General:   Vital Signs:   Pain:   ADL: ADL Grooming: Dependent Where Assessed-Grooming: Sitting at sink Toileting: Dependent Where Assessed-Toileting: Bed level Vision   Perception    Praxis   Exercises:   Other Treatments:     Therapy/Group: Individual Therapy  [name] 11/18/2019, 12:58 PM

## 2019-11-18 NOTE — Progress Notes (Signed)
[facility] PHYSICAL MEDICINE & REHABILITATION PROGRESS NOTE   Subjective/Complaints: Slept poorly last night as she was waking with nightmares. This has been the first time in a while. Discussed Prazosin as an option and have ordered PRN in case she needs tonight Started Marinol last night  ROS: limited by anxiety/behavior  Objective:   No results found. Recent Labs    11/17/19 0513  WBC 5.4  HGB 13.3  HCT 40.9  PLT 270   Recent Labs    11/17/19 0513  NA 139  K 3.5  CL 108  CO2 25  GLUCOSE 100*  BUN 11  CREATININE 0.64  CALCIUM 9.2    Intake/Output Summary (Last 24 hours) at 11/18/2019 1250 Last data filed at 11/18/2019 1215 Gross per 24 hour  Intake 5 ml  Output --  Net 5 ml        Physical Exam: Vital Signs Blood pressure 128/82, pulse 78, temperature 98.2 F (36.8 C), resp. rate 16, height 5\' 7"  (1.702 m), weight 57 kg, SpO2 99 %. General: Alert and oriented x 3, No apparent distress HEENT: Head is normocephalic, atraumatic, PERRLA, EOMI, sclera anicteric, oral mucosa pink and moist, dentition intact, ext ear canals clear,  Neck: Supple without JVD or lymphadenopathy Heart: Reg rate and rhythm. No murmurs rubs or gallops Chest: CTA bilaterally without wheezes, rales, or rhonchi; no distress GI: Non-distended.  BS +. Skin: Warm and dry.  Intact. Psych: Delayed.  Slowed.  Blunted. Musc: No edema in extremities.  No tenderness in extremities. Neuro: Alert Generalized tremors Motor:?  4-4+/5 throughout    Assessment/Plan: 1. Functional deficits secondary to acute, progressive dementia which require 3+ hours per day of interdisciplinary therapy in a comprehensive inpatient rehab setting.  Physiatrist is providing close team supervision and 24 hour management of active medical problems listed below.  Physiatrist and rehab team continue to assess barriers to discharge/monitor patient progress toward functional and medical goals  Care  Tool:  Bathing    Body parts bathed by patient: Chest, Abdomen, Left arm, Right arm   Body parts bathed by helper: Front perineal area, Buttocks     Bathing assist Assist Level: Maximal Assistance - Patient 24 - 49%     Upper Body Dressing/Undressing Upper body dressing   What is the patient wearing?: Pull over shirt    Upper body assist Assist Level: Maximal Assistance - Patient 25 - 49%    Lower Body Dressing/Undressing Lower body dressing      What is the patient wearing?: Underwear/pull up, Pants     Lower body assist Assist for lower body dressing: Total Assistance - Patient < 25%     Toileting Toileting    Toileting assist Assist for toileting: Total Assistance - Patient < 25%     Transfers Chair/bed transfer  Transfers assist     Chair/bed transfer assist level: Maximal Assistance - Patient 25 - 49%     Locomotion Ambulation   Ambulation assist   Ambulation activity did not occur: Safety/medical concerns (limited by anxiety and decreased strength/balance)  Assist level: 2 helpers Assistive device: Hand held assist Max distance: 30 ft   Walk 10 feet activity   Assist  Walk 10 feet activity did not occur: Safety/medical concerns  Assist level: 2 helpers Assistive device: Hand held assist   Walk 50 feet activity   Assist Walk 50 feet with 2 turns activity did not occur: Safety/medical concerns (single turn/8ft)         Walk 150 feet  activity   Assist Walk 150 feet activity did not occur: Safety/medical concerns         Walk 10 feet on uneven surface  activity   Assist Walk 10 feet on uneven surfaces activity did not occur: Safety/medical concerns         Wheelchair     Assist Will patient use wheelchair at discharge?: No (not anticipated per PT long term goals)   Wheelchair activity did not occur: Safety/medical concerns (limited by decreased cognition and anxiety)         Wheelchair 50 feet with 2 turns  activity    Assist    Wheelchair 50 feet with 2 turns activity did not occur: Safety/medical concerns       Wheelchair 150 feet activity     Assist  Wheelchair 150 feet activity did not occur: Safety/medical concerns       Blood pressure 128/82, pulse 78, temperature 98.2 F (36.8 C), resp. rate 16, height 5\' 7"  (1.702 m), weight 57 kg, SpO2 99 %.  Medical Problem List and Plan: 1.Progressive decrease in functional mobility, impaired cognition, apraxia with panic attacks generalized weakness and fallssecondary to acute metabolic encephalopathy/progressive dementiaof unknown etiology.  Symptoms most consistent with a functional neurological disorder. This makes sense given negative w/u by neurology and presentation which can fluctuate with anxiety levels. Also she is making generalized progress over the past week. Continue supportive and restorative measures as we are doing.  -Remains pending -Continue Sinemet trial 25-100 mg 3 times daily Continue CIR  2. Antithrombotics: -DVT/anticoagulation:Lovenox -antiplatelet therapy: N/A 3. Pain Management:Tylenolas needed. Well controlled 4. Mood:Zoloft 50 mg daily, Valium12.5mg  q12 scheduled -pt sees psychiatry as outpt -antipsychotic agents: N/A  -continue melatonin to assist with sleep, sleep chart  -pt/husband are aware of importance of regular sleep in her regimen  -added prazosin prn for nightmares 5. Neuropsych: This patientis notcapable of making decisions on herown behalf-history of dementia. 6. Skin/Wound Care:Routine skin checks 7. Fluids/Electrolytes/Nutrition:encourage po    -resumed megace. Intake still touch and go. Increased to 800mg  bid per home regimen until po picks up   -drinking supplemental shakes    P.o. intake?  Improving. Marinol started last night 8. Hyper thyroidism. Hold Tapazole 5 mg 3 times daily per  #9. continue Inderal 60 mg daily.   -Make referral to Dr. Buddy Duty for end of this month.  9. Elevated LFT's-  LFTs trending downward on 10/12.  -consulted with pharmacy and endocrine.  -?  Secondary to tapazole, DC'd  11. Weight loss/malnutrition/failure to thrive  Added Marinol- 2.5 mg BID- won't stop Megace just to see if it works better  See #7 12.  Hypoalbuminemia  Supplement initiated on 10/11  Albumin 2.9 on 10/12  LOS: 9 days A FACE TO FACE EVALUATION WAS PERFORMED  Tyeesha Riker P Valaria Kohut 11/18/2019, 12:50 PM

## 2019-11-18 NOTE — Progress Notes (Signed)
Patient did not sleep well woke up about 0200 restless irritable and agitated.

## 2019-11-18 NOTE — Progress Notes (Signed)
Speech Language Pathology Daily Session Note  Patient Details  Name: Hannah Padilla MRN: [id number] Date of Birth: 21-Mar-1950  Today's Date: 11/18/2019 SLP Individual Time: 0725-0825 SLP Individual Time Calculation (min): 60 min  Short Term Goals: Week 2: SLP Short Term Goal 1 (Week 2): Patient will demonstrate sustained attention in 5 minute intervals with max A verbal cues. SLP Short Term Goal 2 (Week 2): Patient will demonstrate orientation to place and time with Max A multimodal cues. SLP Short Term Goal 3 (Week 2): Pt will demonstrate basic problem solving skills by using call bell to request assistance with max A multimodal cues. SLP Short Term Goal 4 (Week 2): Pt will consume dys 3 textures and thin liquid diet with minimal overt s/s aspiration and min A verbal cues for use of swallow strategies. SLP Short Term Goal 5 (Week 2): Pt will increase vocal intensity to 75% intelligibility at the sentence level with max A verbal cues.  Skilled Therapeutic Interventions: Skilled treatment session focused on cognitive and dysphagia goals. SLP facilitated session by providing overall Max A verbal cues and extra time for sequencing and problem solving with transfer to the commode via the [facility]. Patient appeared to do better when commands were more general vs broken down into individual steps as this appeared to overt-stimulate the patient. Patient was continent of bladder. Patient was then perseverative on having a bowel movement but was unsuccessful despite use of bedpan and the commode. Patient also performed basic self-care tasks with Max A verbal and tactile cues. Patient self-fed (brought the spoon to her mouth) after her husband scooped the bolus in 100% of opportunities with extra time and overall Mod A verbal cues. Patient demonstrated efficient mastication and complete oral clearance with solid textures. Patient with overt cough X 1 with thin, suspect due to a large bolus. Patient also utilized an  increased vocal intensity at the phrase level increasing intelligibility to ~80%. Patient left upright in bed with alarm on and all needs within reach. Continue with current plan of care.      Pain No/Denies Pain   Therapy/Group: Individual Therapy  Liam Cammarata 11/18/2019, 12:08 PM

## 2019-11-18 NOTE — Progress Notes (Signed)
Physical Therapy Session Note  Patient Details  Name: Hannah Padilla MRN: [id number] Date of Birth: 14-Jul-1950  Today's Date: 11/18/2019 PT Individual Time: 0905-1000 PT Individual Time Calculation (min): 55 min   Short Term Goals: Week 2:  PT Short Term Goal 1 (Week 2): Patient will perform dynamic sitting balance with CGA. PT Short Term Goal 2 (Week 2): Patient will perform basic transfers with mod A of 1 person. PT Short Term Goal 3 (Week 2): Patient will ambulate 10 feet with max A of 1 person using LRAD.  Skilled Therapeutic Interventions/Progress Updates:     Patient in bed with her husband at bedside upon PT arrival. Patient alert and agreeable to PT session. Patient denied pain during session. Patient's husband reported that she did not sleep between the hours of 2-6am due to nightmares. Patient did not recall how she slept last night. Patient calm with bright affect at beginning of session, became increasingly nervous with mobility and very fatigued following gait training, see details below.   Therapeutic Activity: Bed Mobility: Patient performed rolling R/L to don pants with total A with min A following cues and HOH assist for reaching for the rails. She performedsupine to/from sit with min A with improved initiation today. Provided verbal cues for pushing up to her elbows then her hands to sitting, responds best to direct single step-commands such as "sit up" or "lie down" and manual facilitation for sequencing rather than step-by-step cuing. Transfers: Patient performed sit to/from stand x1 with max A of 1 person, unable to come to full stand due due to posterior bias and increased hip flexion. She then performed sit to/from stand with mod A +2 with B HHA. Provided verbal cues and facilitation for foot placement, forward weight shift, and hip and knee extension.  Gait Training:  Patient ambulated 30 feet using B HHA with mod-max A from PT and min A HHA from patient's husband for  improved patient participation. Ambulated with posterior bias, required PT to provided support from behind for trunk, patient unable to bring her COM over BOS throughout, although some improved with repetition. Provided verbal cues for midline orientation, sequencing on turns, as patient tends to cross her legs, and max encouragement for increased distance.  Patient sat EOB with supervision for sitting balance as PT discussed home set-up and patient's progress with the patient and her husband. Patient became fatigued and asked to lie down ~5 min into discussion. Returned patient to supine, as described above.   Patient in bed with her husband preparing to leave at end of session with breaks locked, bed alarm set, and all needs within reach.    Therapy Documentation Precautions:  Precautions Precautions: Fall Precaution Comments: anxious with mobility-resists posteriorly; tremors bilateral hands; watch skin integrity bilateral palms due to clenching fists intermittently Restrictions Weight Bearing Restrictions: No   Therapy/Group: Individual Therapy  Jazariah Teall L Earnie Rockhold PT, DPT  11/18/2019, 12:42 PM

## 2019-11-19 ENCOUNTER — Inpatient Hospital Stay (HOSPITAL_COMMUNITY): Payer: Medicare Other | Admitting: Speech Pathology

## 2019-11-19 ENCOUNTER — Inpatient Hospital Stay (HOSPITAL_COMMUNITY): Payer: Medicare Other

## 2019-11-19 NOTE — Progress Notes (Signed)
Speech Language Pathology Daily Session Note  Patient Details  Name: Hannah Padilla MRN: [id number] Date of Birth: 18-Jan-1951  Today's Date: 11/19/2019 SLP Individual Time: 0730-0830 SLP Individual Time Calculation (min): 60 min  Short Term Goals: Week 2: SLP Short Term Goal 1 (Week 2): Patient will demonstrate sustained attention in 5 minute intervals with max A verbal cues. SLP Short Term Goal 2 (Week 2): Patient will demonstrate orientation to place and time with Max A multimodal cues. SLP Short Term Goal 3 (Week 2): Pt will demonstrate basic problem solving skills by using call bell to request assistance with max A multimodal cues. SLP Short Term Goal 4 (Week 2): Pt will consume dys 3 textures and thin liquid diet with minimal overt s/s aspiration and min A verbal cues for use of swallow strategies. SLP Short Term Goal 5 (Week 2): Pt will increase vocal intensity to 75% intelligibility at the sentence level with max A verbal cues.  Skilled Therapeutic Interventions: Skilled treatment session focused on cognitive goals. SLP facilitated session by providing overall Mod-Max A verbal and visual cues and extra time for sequencing with transfer from the bed to the commode via the [facility]. Patient was continent of bowel and bladder. Patient performed basic self-care tasks at the stink with Max verbal and tactile cues. Patient was oriented to place with Min A visual cues but required total A for recall of biographical information and appeared to demonstrate increased anxiety when asked multiple questions. Patient required Max A verbal cues for use of a slow rate and increased vocal intensity to achieve ~70% intelligibility at the phrase level. Patient also identified and counted small amounts of change with Max verbal cues for attention and problem solving. Patient left upright in bed with alarm on and all needs within reach. Continue with current plan of care.      Pain No/Denies Pain   Therapy/Group:  Individual Therapy  Tejuan Gholson 11/19/2019, 8:36 AM

## 2019-11-19 NOTE — Progress Notes (Signed)
Occupational Therapy Session Note  Patient Details  Name: Hannah Padilla MRN: [id number] Date of Birth: 08/16/1950  Today's Date: 11/19/2019 OT Individual Time: 0845-1000 OT Individual Time Calculation (min): 75 min    Short Term Goals: Week 1:  OT Short Term Goal 1 (Week 1): Pt will complete UB dressing with mod assist. OT Short Term Goal 1 - Progress (Week 1): Not met OT Short Term Goal 2 (Week 1): Pt will complete sit <>stand with mod assist in preperation for LB self care. OT Short Term Goal 2 - Progress (Week 1): Not met OT Short Term Goal 3 (Week 1): Pt will complete oral hygiene sitting sinkside with min assist. OT Short Term Goal 3 - Progress (Week 1): Met OT Short Term Goal 4 (Week 1): Pt will bathe UB with mod assist in seated position. OT Short Term Goal 4 - Progress (Week 1): Met  Skilled Therapeutic Interventions/Progress Updates:    1:1. Pt received in bed agreeable to OT and shower. Pt anxious throughtout and requires VC and demo for breathing through nose and out through mouth. Pt completes all transfers with MIN-MOD A overall with grab bar to bed rail for closed chain movement. All Adls completed at MAX A level with step by step, concise multimodal cuing to improve functional cognition throughout toileting, bathing and dressing. Grooming at sink with S overall and visual/verbal cues for initiation, sequencing and termination applied with significantly increased time to process commands. Overall pt is greatly improviing in mobility decreasing BOC. Exited session wih tp tseated in bed, exit alarm on and call lig tin reach  Therapy Documentation Precautions:  Precautions Precautions: Fall Precaution Comments: anxious with mobility-resists posteriorly; tremors bilateral hands; watch skin integrity bilateral palms due to clenching fists intermittently Restrictions Weight Bearing Restrictions: No General:   Vital Signs:   Pain:   ADL: ADL Grooming: Dependent Where  Assessed-Grooming: Sitting at sink Toileting: Dependent Where Assessed-Toileting: Bed level Vision   Perception    Praxis   Exercises:   Other Treatments:     Therapy/Group: Individual Therapy  [name] 11/19/2019, 10:02 AM

## 2019-11-19 NOTE — Progress Notes (Signed)
[facility] PHYSICAL MEDICINE & REHABILITATION PROGRESS NOTE   Subjective/Complaints: Slept better last night Had BM this morning Eating better with Marinol Has been expressing that she is scared.  ROS: limited by anxiety/behavior  Objective:   No results found. Recent Labs    11/17/19 0513  WBC 5.4  HGB 13.3  HCT 40.9  PLT 270   Recent Labs    11/17/19 0513  NA 139  K 3.5  CL 108  CO2 25  GLUCOSE 100*  BUN 11  CREATININE 0.64  CALCIUM 9.2    Intake/Output Summary (Last 24 hours) at 11/19/2019 0900 Last data filed at 11/19/2019 0700 Gross per 24 hour  Intake 5 ml  Output 120 ml  Net -115 ml        Physical Exam: Vital Signs Blood pressure 110/69, pulse 69, temperature 98.6 F (37 C), temperature source Oral, resp. rate 16, height 5\' 7"  (1.702 m), weight 57 kg, SpO2 97 %. General: Alert, No apparent distress HEENT: Head is normocephalic, atraumatic, PERRLA, EOMI, sclera anicteric, oral mucosa pink and moist, dentition intact, ext ear canals clear,  Neck: Supple without JVD or lymphadenopathy Heart: Reg rate and rhythm. No murmurs rubs or gallops Chest: CTA bilaterally without wheezes, rales, or rhonchi; no distress GI: Non-distended.  BS +. Skin: Warm and dry.  Intact. Psych: Delayed.  Slowed.  Blunted. Musc: No edema in extremities.  No tenderness in extremities. Neuro: Alert Generalized tremors, worst in right hand- present at rest. Motor:?  4-4+/5 throughout     Assessment/Plan: 1. Functional deficits secondary to acute, progressive dementia which require 3+ hours per day of interdisciplinary therapy in a comprehensive inpatient rehab setting.  Physiatrist is providing close team supervision and 24 hour management of active medical problems listed below.  Physiatrist and rehab team continue to assess barriers to discharge/monitor patient progress toward functional and medical goals  Care Tool:  Bathing    Body parts bathed by patient:  Chest, Abdomen, Left arm, Right arm   Body parts bathed by helper: Front perineal area, Buttocks     Bathing assist Assist Level: Maximal Assistance - Patient 24 - 49%     Upper Body Dressing/Undressing Upper body dressing   What is the patient wearing?: Pull over shirt    Upper body assist Assist Level: Maximal Assistance - Patient 25 - 49%    Lower Body Dressing/Undressing Lower body dressing      What is the patient wearing?: Underwear/pull up, Pants     Lower body assist Assist for lower body dressing: Total Assistance - Patient < 25%     Toileting Toileting    Toileting assist Assist for toileting: Total Assistance - Patient < 25%     Transfers Chair/bed transfer  Transfers assist     Chair/bed transfer assist level: Maximal Assistance - Patient 25 - 49%     Locomotion Ambulation   Ambulation assist   Ambulation activity did not occur: Safety/medical concerns (limited by anxiety and decreased strength/balance)  Assist level: 2 helpers Assistive device: Hand held assist Max distance: 30 ft   Walk 10 feet activity   Assist  Walk 10 feet activity did not occur: Safety/medical concerns  Assist level: 2 helpers Assistive device: Hand held assist   Walk 50 feet activity   Assist Walk 50 feet with 2 turns activity did not occur: Safety/medical concerns (single turn/62ft)         Walk 150 feet activity   Assist Walk 150 feet activity did not  occur: Safety/medical concerns         Walk 10 feet on uneven surface  activity   Assist Walk 10 feet on uneven surfaces activity did not occur: Safety/medical concerns         Wheelchair     Assist Will patient use wheelchair at discharge?: No (not anticipated per PT long term goals)   Wheelchair activity did not occur: Safety/medical concerns (limited by decreased cognition and anxiety)         Wheelchair 50 feet with 2 turns activity    Assist    Wheelchair 50 feet with 2  turns activity did not occur: Safety/medical concerns       Wheelchair 150 feet activity     Assist  Wheelchair 150 feet activity did not occur: Safety/medical concerns       Blood pressure 110/69, pulse 69, temperature 98.6 F (37 C), temperature source Oral, resp. rate 16, height 5\' 7"  (1.702 m), weight 57 kg, SpO2 97 %.  Medical Problem List and Plan: 1.Progressive decrease in functional mobility, impaired cognition, apraxia with panic attacks generalized weakness and fallssecondary to acute metabolic encephalopathy/progressive dementiaof unknown etiology.  Symptoms most consistent with a functional neurological disorder. This makes sense given negative w/u by neurology and presentation which can fluctuate with anxiety levels. Also she is making generalized progress over the past week. Continue supportive and restorative measures as we are doing.  -Remains pending -Continue Sinemet trial 25-100 mg 3 times daily Continue CIR  2. Antithrombotics: -DVT/anticoagulation:Lovenox -antiplatelet therapy: N/A 3. Pain Management:Tylenolas needed. Some headache this morning. 4. Mood:Zoloft 50 mg daily, Valium12.5mg  q12 scheduled -pt sees psychiatry as outpt -antipsychotic agents: N/A  -continue melatonin to assist with sleep, sleep chart  -pt/husband are aware of importance of regular sleep in her regimen  -added prazosin prn for nightmares- did not require overnight. 5. Neuropsych: This patientis notcapable of making decisions on herown behalf-history of dementia. 6. Skin/Wound Care:Routine skin checks 7. Fluids/Electrolytes/Nutrition:encourage po    -resumed megace. Intake still touch and go. Increased to 800mg  bid per home regimen until po picks up   -drinking supplemental shakes    P.o. intake? Improving. Marinol started last night 8. Hyper thyroidism. Hold Tapazole 5 mg 3 times  daily per #9. continue Inderal 60 mg daily.   -Make referral to Dr. Buddy Duty for end of this month.  9. Elevated LFT's-  LFTs trending downward on 10/12.  -consulted with pharmacy and endocrine.  -?  Secondary to tapazole, DC'd  11. Weight loss/malnutrition/failure to thrive  Added Marinol- 2.5 mg BID- won't stop Megace just to see if it works better  See #7 12.  Hypoalbuminemia  Supplement initiated on 10/11  Albumin 2.9 on 10/12  LOS: 10 days A FACE TO FACE EVALUATION WAS PERFORMED  Kieran Nachtigal P Tristan Bramble 11/19/2019, 9:00 AM

## 2019-11-19 NOTE — Progress Notes (Signed)
Physical Therapy Session Note  Patient Details  Name: Hannah Padilla MRN: [id number] Date of Birth: 01-12-1951  Today's Date: 11/19/2019 PT Individual Time: 1420-1530 PT Individual Time Calculation (min): 70 min   Short Term Goals: Week 2:  PT Short Term Goal 1 (Week 2): Patient will perform dynamic sitting balance with CGA. PT Short Term Goal 2 (Week 2): Patient will perform basic transfers with mod A of 1 person. PT Short Term Goal 3 (Week 2): Patient will ambulate 10 feet with max A of 1 person using LRAD.  Skilled Therapeutic Interventions/Progress Updates:     Patient in bed visible anxious with fists clenched upon PT arrival. Patient alert and reporting stomach pain and nausea at beginning of session, RN made aware and provided medications.   Patient disoriented and describing other people in the room and having visual and auditory hallucinations at beginning of session, RN made aware. Provided reorientation for patient frequently throughout session with patient able to redirect, and eventually use signs in the room to self orient. Performed anxiety/stress management with diaphragmatic breathing, and muscle relaxation ~30 min with strategies above for orientation. Patient responded well to cues and required reassurance that she did not have to get out of bed right away due to anxiety. NT came by to perform bladder scan. Coached patient through use of diaphragmatic breathing and had patient repeat therapists description on the procedure to maintain understanding and reduce anxiety throughout.   Patient's body language and affect more calm and relaxed following anxiety management strategies. Patient agreeable to transfer to [facility] to attempt to empty bladder. Performed sit>supine with mod-max A with HOB elevated and use of bed rail. Provided cues for rolling to the R and using B UEs to push up to sitting. Patient requested use of Stedy due to fear of falling, unable to redirect x1, provided use of  Stedy to maintain calm affect and focus on hip extension in standing. Patient performed sit to/from stand with min-mod A using the Stedy x3, noted improved standing posture and balance with CGA with B UE support 20-45 sec x3. Provided verbal cues for forward weight shift, hip extension, and controlled descent. Patient declined toileting and became anxious when therapist initated transfer in the [facility] to the [facility]. Terminated attempt to reduce anxiety and shifted focus to standing balance and transfers as described above.  Maintained sitting balance with CGA with mild posterior lean prior to standing and improved posterior lean after standing. Patient requesting to return to lying after third trial despite max encouragement. Performed sit to supine with max A due to fear of falling when returning trunk to lying position. NT informed that patient did not void during session at end of session.   Patient required increased time with all mobility due to anxiety, fear of falling, and decreased activity tolerance.   Patient in bed with relaxed body language and smiling at end of session with breaks locked, bed alarm set, and all needs within reach.    Therapy Documentation Precautions:  Precautions Precautions: Fall Precaution Comments: anxious with mobility-resists posteriorly; tremors bilateral hands; watch skin integrity bilateral palms due to clenching fists intermittently Restrictions Weight Bearing Restrictions: No   Therapy/Group: Individual Therapy  Jenayah Antu L Phelan Schadt PT, DPT  11/19/2019, 2:20 PM

## 2019-11-20 ENCOUNTER — Inpatient Hospital Stay (HOSPITAL_COMMUNITY): Payer: Medicare Other | Admitting: Occupational Therapy

## 2019-11-20 ENCOUNTER — Inpatient Hospital Stay (HOSPITAL_COMMUNITY): Payer: Medicare Other

## 2019-11-20 ENCOUNTER — Inpatient Hospital Stay (HOSPITAL_COMMUNITY): Payer: Medicare Other | Admitting: Speech Pathology

## 2019-11-20 NOTE — Progress Notes (Signed)
PHYSICAL MEDICINE & REHABILITATION PROGRESS NOTE   Subjective/Complaints: This morning she states that her husband is in a seminar and so she is very anxious. She states that she cannot find anything and that she is pretty bad. Nutrition is working with her on ordering food.   ROS: limited by anxiety/behavior  Objective:   No results found. No results for input(s): WBC, HGB, HCT, PLT in the last 72 hours. No results for input(s): NA, K, CL, CO2, GLUCOSE, BUN, CREATININE, CALCIUM in the last 72 hours.  Intake/Output Summary (Last 24 hours) at 11/20/2019 1017 Last data filed at 11/20/2019 0815 Gross per 24 hour  Intake 370 ml  Output 100 ml  Net 270 ml    Physical Exam: Vital Signs Blood pressure 113/69, pulse 63, temperature 98.3 F (36.8 C), temperature source Oral, resp. rate 18, height 5\' 7"  (1.702 m), weight 57 kg, SpO2 100 %. General: Alert, No apparent distress HEENT: Head is normocephalic, atraumatic, PERRLA, EOMI, sclera anicteric, oral mucosa pink and moist, dentition intact, ext ear canals clear,  Neck: Supple without JVD or lymphadenopathy Heart: Reg rate and rhythm. No murmurs rubs or gallops Chest: CTA bilaterally without wheezes, rales, or rhonchi; no distress Abdomen: Soft, non-tender, non-distended, bowel sounds positive. Extremities: No clubbing, cyanosis, or edema. Pulses are 2+ Skin: Clean and intact without signs of breakdown Psych: Delayed.  Slowed.  Blunted. Musc: No edema in extremities.  No tenderness in extremities. Neuro: Alert Generalized tremors, worst in right hand- present at rest. Motor:?  4-4+/5 throughout  Assessment/Plan: 1. Functional deficits secondary to acute, progressive dementia which require 3+ hours per day of interdisciplinary therapy in a comprehensive inpatient rehab setting.  Physiatrist is providing close team supervision and 24 hour management of active medical problems listed below.  Physiatrist and rehab team  continue to assess barriers to discharge/monitor patient progress toward functional and medical goals  Care Tool:  Bathing    Body parts bathed by patient: Chest, Abdomen, Left arm, Right arm   Body parts bathed by helper: Front perineal area, Buttocks     Bathing assist Assist Level: Maximal Assistance - Patient 24 - 49%     Upper Body Dressing/Undressing Upper body dressing   What is the patient wearing?: Pull over shirt    Upper body assist Assist Level: Maximal Assistance - Patient 25 - 49%    Lower Body Dressing/Undressing Lower body dressing      What is the patient wearing?: Underwear/pull up, Pants     Lower body assist Assist for lower body dressing: Total Assistance - Patient < 25%     Toileting Toileting    Toileting assist Assist for toileting: Total Assistance - Patient < 25%     Transfers Chair/bed transfer  Transfers assist     Chair/bed transfer assist level: Maximal Assistance - Patient 25 - 49%     Locomotion Ambulation   Ambulation assist   Ambulation activity did not occur: Safety/medical concerns (limited by anxiety and decreased strength/balance)  Assist level: 2 helpers Assistive device: Hand held assist Max distance: 30 ft   Walk 10 feet activity   Assist  Walk 10 feet activity did not occur: Safety/medical concerns  Assist level: 2 helpers Assistive device: Hand held assist   Walk 50 feet activity   Assist Walk 50 feet with 2 turns activity did not occur: Safety/medical concerns (single turn/56ft)         Walk 150 feet activity   Assist Walk 150 feet activity  did not occur: Safety/medical concerns         Walk 10 feet on uneven surface  activity   Assist Walk 10 feet on uneven surfaces activity did not occur: Safety/medical concerns         Wheelchair     Assist Will patient use wheelchair at discharge?: No (not anticipated per PT long term goals)   Wheelchair activity did not occur:  Safety/medical concerns (limited by decreased cognition and anxiety)         Wheelchair 50 feet with 2 turns activity    Assist    Wheelchair 50 feet with 2 turns activity did not occur: Safety/medical concerns       Wheelchair 150 feet activity     Assist  Wheelchair 150 feet activity did not occur: Safety/medical concerns       Blood pressure 113/69, pulse 63, temperature 98.3 F (36.8 C), temperature source Oral, resp. rate 18, height 5\' 7"  (1.702 m), weight 57 kg, SpO2 100 %.  Medical Problem List and Plan: 1.Progressive decrease in functional mobility, impaired cognition, apraxia with panic attacks generalized weakness and fallssecondary to acute metabolic encephalopathy/progressive dementiaof unknown etiology.  Symptoms most consistent with a functional neurological disorder. This makes sense given negative w/u by neurology and presentation which can fluctuate with anxiety levels. Also she is making generalized progress over the past week. Continue supportive and restorative measures as we are doing.  -Continue Sinemet trial 25-100 mg 3 times daily Continue CIR  2. Antithrombotics: -DVT/anticoagulation:Lovenox -antiplatelet therapy: N/A 3. Pain Management:Tylenolas needed. Has headache at times- currently well controlled.  4. Mood:Zoloft 50 mg daily, Valium12.5mg  q12 scheduled -pt sees psychiatry as outpt -antipsychotic agents: N/A  -continue melatonin to assist with sleep, sleep chart  -pt/husband are aware of importance of regular sleep in her regimen  -added prazosin prn for nightmares- did not require overnight. Has been sleeping better.  5. Neuropsych: This patientis notcapable of making decisions on herown behalf-history of dementia. 6. Skin/Wound Care:Routine skin checks 7. Fluids/Electrolytes/Nutrition:encourage po    -resumed megace. Intake still touch and go.  Increased to 800mg  bid per home regimen until po picks up   -drinking supplemental shakes    P.o. intake? Improving with Marinol.  8. Hyper thyroidism. Hold Tapazole 5 mg 3 times daily per #9. continue Inderal 60 mg daily.   -Make referral to Dr. Buddy Duty for end of this month.  9. Elevated LFT's-  LFTs trending downward on 10/12.  -consulted with pharmacy and endocrine.  -?  Secondary to tapazole, DC'd  11. Weight loss/malnutrition/failure to thrive  Added Marinol- 2.5 mg BID- won't stop Megace just to see if it works better  See #7 12.  Hypoalbuminemia  Supplement initiated on 10/11  Albumin 2.9 on 10/12  LOS: 11 days A FACE TO FACE EVALUATION WAS PERFORMED  Michae Grimley P [place] 11/20/2019, 10:17 AM

## 2019-11-20 NOTE — Progress Notes (Signed)
Occupational Therapy Session Note  Patient Details  Name: Hannah Padilla MRN: [id number] Date of Birth: 09-02-1950  Today's Date: 11/20/2019 OT Individual Time: 1350-1430 OT Individual Time Calculation (min): 40 min  20 minutes missed  Skilled Therapeutic Interventions/Progress Updates:    Pt greeted in bed with no c/o pain. Started by establishing rapport with pt as she was visibly anxious and also actively hallucinating. Note that she is also soft-spoken. Oriented pt to time and place, gentle encouragement provided for engaging in self care tasks or OOB activity (I.e crafting or coloring as she reported she liked to craft PTA). Pt initially declined, adamant about staying in bed, trembling and clenching Lt fist. OT guided her through diaphragmatic breathing exercises while assisting her with keeping digits extended for skin integrity. Continued educating pt that hallucinations were not real. Pt reported seeing babies, her friend Hannah Padilla, and men watching DVDs while holding napkins. She then reported she had to have a BM. Mod A for supine<sit. While EOB, pt with increased s/s anxiety. Provided calming cues at this time, however pt unable to be redirected from her hallucinations. Then she became agitated, stating she didn't have to use the bathroom. Assistance required for returning to bed. RN made aware of her hallucinations. Pt remained in bed at close of session with all needs within reach and bed alarm set. Time missed due to confusion/agitation.   Therapy Documentation Precautions:  Precautions Precautions: Fall Precaution Comments: anxious with mobility-resists posteriorly; tremors bilateral hands; watch skin integrity bilateral palms due to clenching fists intermittently Restrictions Weight Bearing Restrictions: No Vital Signs: Therapy Vitals Temp: 97.7 F (36.5 C) Pulse Rate: 81 Resp: 17 BP: 115/77 Patient Position (if appropriate): Lying Oxygen Therapy SpO2: 91 % O2 Device: Room  Air ADL: ADL Grooming: Dependent Where Assessed-Grooming: Sitting at sink Toileting: Dependent Where Assessed-Toileting: Bed level     Therapy/Group: Individual Therapy  Kandace Elrod A Bryar Dahms 11/20/2019, 4:17 PM

## 2019-11-20 NOTE — Progress Notes (Signed)
Speech Language Pathology Daily Session Note  Patient Details  Name: Hannah Padilla MRN: [id number] Date of Birth: 09-Aug-1950  Today's Date: 11/20/2019 SLP Individual Time: 2440-1027 SLP Individual Time Calculation (min): 60 min  Short Term Goals: Week 2: SLP Short Term Goal 1 (Week 2): Patient will demonstrate sustained attention in 5 minute intervals with max A verbal cues. SLP Short Term Goal 2 (Week 2): Patient will demonstrate orientation to place and time with Max A multimodal cues. SLP Short Term Goal 3 (Week 2): Pt will demonstrate basic problem solving skills by using call bell to request assistance with max A multimodal cues. SLP Short Term Goal 4 (Week 2): Pt will consume dys 3 textures and thin liquid diet with minimal overt s/s aspiration and min A verbal cues for use of swallow strategies. SLP Short Term Goal 5 (Week 2): Pt will increase vocal intensity to 75% intelligibility at the sentence level with max A verbal cues.  Skilled Therapeutic Interventions: Skilled treatment session focused on cognitive goals. Patient appeared more anxious today and required Max A multimodal cues for use of diaphragmatic breathing in attempts to feel more relaxed. Max A multimodal cues were also needed for use of speech intelligibility strategies at the phrase level to achieve ~50% intelligibility. Intelligibility appeared worse today, suspect due to difficulty with coordinating speech on exhalation due to appearing more anxious. Decreased coordination of breathing due to suspected anxiety also impacted swallowing function with coughing noted with sips of liquids via cup. Recommend patient continue current diet. Patient asked to get out of bed and was able to scoot EOB with extra time and overall supervision visual cues. However, once EOB, patient asked to go back to bed. More than a reasonable amount of time and overall Mod A verbal and tactile cues were needed for initiation of basic tasks like drinking.  Max A verbal and tactile cues were also needed to perform 15 repetitions of EMST at 12 cm H2O accurately. Patient left supine in bed with alarm on and all needs within reach. Continue with current plan of care.      Pain No/Denies Pain   Therapy/Group: Individual Therapy  Daphna Lafuente 11/20/2019, 2:30 PM

## 2019-11-20 NOTE — Progress Notes (Signed)
Physical Therapy Session Note  Patient Details  Name: Hannah Padilla MRN: [id number] Date of Birth: 1950/07/25  Today's Date: 11/20/2019 PT Individual Time: 0900-1015 PT Individual Time Calculation (min): 75 min   Short Term Goals: Week 1:  PT Short Term Goal 1 (Week 1): Patient will perform bed mobility with max A of 1 person consistently. PT Short Term Goal 1 - Progress (Week 1): Met PT Short Term Goal 2 (Week 1): Patient will perform dynamic sitting balance with mod A. PT Short Term Goal 2 - Progress (Week 1): Met PT Short Term Goal 3 (Week 1): Patient will perform transfer with max A of 1 person. PT Short Term Goal 3 - Progress (Week 1): Met Week 2:  PT Short Term Goal 1 (Week 2): Patient will perform dynamic sitting balance with CGA. PT Short Term Goal 2 (Week 2): Patient will perform basic transfers with mod A of 1 person. PT Short Term Goal 3 (Week 2): Patient will ambulate 10 feet with max A of 1 person using LRAD. Week 3:     Skilled Therapeutic Interventions/Progress Updates:    PAIN pt w/no apparent discomfort, unable to clearly reply to inquiry regarding pain.   Pt initially supine, awake, mumbling and shaking "this is terrible, they said I don't have to, I can't".   Therapist able to calm patient and assure of safety, reoriented to situation and setting.   Pt lifts feet for therapist to thread pants and bends knees for therapist to raise pants to hips w/verbal and tactile cues. Pt rolls L and R w/hand over hand guidance for initiation and completion, max assist, use of rails.  Therapist raised pants Side to sit w/mod assit and tactile + verbal cues to intitiate/complete, assurance given due to fear w/transitions.  Additional time w/all mobility due to fear.  In sitting, pt leans post, but able to bring to midline w/hand over hand guidance of hands to knees. Therapist set up stedy then pt STS in Hannah Padilla w/min assist, cues to facilitate/initiate. Pt then able to stand in stedy  >15 min, cues for upright posture but improved extension at hips/midline orientation today and performed ADLs at sink including: Brushing teeth w/max assist, hand over hand guidance Washing face w/max assist, hand over hand guidance Washing and donning glasses w/max assistance, hand over hand guidance.  Pt transferred to wc via Stedy, stand to sit w/min assist, tatctile and verbal cues for initiation/and removed night shirt and donned clean shirt w/max assist, hand over hand guidance.  Pt transported to Day room for continued session.  Pt nervous but reassured by therapist using calm voice, support. STS and gait x 51f w/+2 HHA, moderate post lean, very narrow base of support  Pt transported to main gym.  Stairs: ascends/descends 4 stairs w/2 rails w/max assist, max verbal anc tactile cues for sequencing/advancement of hands on rails, step to gait, decreased stability w/descending due to LE weakness.  Gait:  648fw/+2HHA, manual assist for ant wt shift at hips, narrow base but improved from first gait effort/see above.  Pt transported back to room at end of session.   [facility] to initiate from wc.  Transferred to bed using stedy.  Sit to supine w/max assist and cues.  Repositioned for comfort. Pt left supine w/rails up x 4, alarm set, bed in lowest position, and needs in reach.   Therapy Documentation Precautions:  Precautions Precautions: Fall Precaution Comments: anxious with mobility-resists posteriorly; tremors bilateral hands; watch skin  integrity bilateral palms due to clenching fists intermittently Restrictions Weight Bearing Restrictions: No   Therapy/Group: Individual Therapy  Callie Fielding, PT   Jerrilyn Cairo 11/20/2019, 12:43 PM

## 2019-11-21 ENCOUNTER — Inpatient Hospital Stay (HOSPITAL_COMMUNITY): Payer: Medicare Other | Admitting: Speech Pathology

## 2019-11-21 ENCOUNTER — Inpatient Hospital Stay (HOSPITAL_COMMUNITY): Payer: Medicare Other | Admitting: Occupational Therapy

## 2019-11-21 NOTE — Progress Notes (Signed)
Speech Language Pathology Daily Session Note  Patient Details  Name: Hannah Padilla MRN: [id number] Date of Birth: [birth year]  Today's Date: 11/21/2019 SLP Individual Time: 6378-5885 SLP Individual Time Calculation (min): 45 min  Short Term Goals: Week 2: SLP Short Term Goal 1 (Week 2): Patient will demonstrate sustained attention in 5 minute intervals with max A verbal cues. SLP Short Term Goal 2 (Week 2): Patient will demonstrate orientation to place and time with Max A multimodal cues. SLP Short Term Goal 3 (Week 2): Pt will demonstrate basic problem solving skills by using call bell to request assistance with max A multimodal cues. SLP Short Term Goal 4 (Week 2): Pt will consume dys 3 textures and thin liquid diet with minimal overt s/s aspiration and min A verbal cues for use of swallow strategies. SLP Short Term Goal 5 (Week 2): Pt will increase vocal intensity to 75% intelligibility at the sentence level with max A verbal cues.  Skilled Therapeutic Interventions: Skilled treatment session focused on cognitive goals. Upon arrival, patient was asleep in bed but easily awakened. Patient scooted to the edge of bed with overall Max A verbal and visual cues and was transferred to the wheelchair via the [place]. Patient appeared to demonstrate increased s/s of anxiety requiring more than a reasonable amount of time and overall Max-Total A to initiation and problem solve basic self-care tasks like turning on the sink, washing her face, brushing her teeth, etc. Patient was also essentially unintelligible despite Max A multimodal cues for use of a slow rate and increased vocal intensity. Patient handed off to OT. Continue with current plan of care.      Pain No/Denies Pain   Therapy/Group: Individual Therapy  Lajuan Godbee 11/21/2019, 1:25 PM

## 2019-11-21 NOTE — Progress Notes (Signed)
Occupational Therapy Session Note  Patient Details  Name: Hannah Padilla MRN: [id number] Date of Birth: February 25, 1950  Today's Date: 11/21/2019 OT Individual Time: 1035-1100 OT Individual Time Calculation (min): 25 min   Skilled Therapeutic Interventions/Progress Updates:    Pt greeted via SLP handoff, sitting up in the w/c. She participated in lotion application, face washing, and hair combing while sitting at the sink. HOH and Max A to meet demands of these tasks due to cognitive deficits and s/s anxiety. Therapeutic calmness and simple cues utilized throughout tx. Discussed with spouse [name] therapueptic benefits of bringing comfort items (I.e. pillows, blankets, family pictures) to her room to address anxiety. Also discussed having pt listen to meaningful music while sitting up to increase OOB tolerance. OT set up music listening on the room computer and educated [name] on basic use. Pt remained sitting up in the w/c with spouse present, call bell on bed. Educated [name] to notify nursing staff when pt needed to transfer back to bed. He verbalized understanding.  Therapy Documentation Precautions:  Precautions Precautions: Fall Precaution Comments: anxious with mobility-resists posteriorly; tremors bilateral hands; watch skin integrity bilateral palms due to clenching fists intermittently Restrictions Weight Bearing Restrictions: No Pain: no s/s pain during tx   ADL: ADL Grooming: Dependent Where Assessed-Grooming: Sitting at sink Toileting: Dependent Where Assessed-Toileting: Bed level      Therapy/Group: Individual Therapy  Salbador Fiveash A Zain Bingman 11/21/2019, 12:50 PM

## 2019-11-21 NOTE — Progress Notes (Signed)
[facility] PHYSICAL MEDICINE & REHABILITATION PROGRESS NOTE   Subjective/Complaints: No issues overnight per husband other than poor sleep, tried not taking one of the sleep medications last night according to husband.  Appetite improved this morning  ROS: limited by anxiety/behavior  Objective:   No results found. No results for input(s): WBC, HGB, HCT, PLT in the last 72 hours. No results for input(s): NA, K, CL, CO2, GLUCOSE, BUN, CREATININE, CALCIUM in the last 72 hours.  Intake/Output Summary (Last 24 hours) at 11/21/2019 1322 Last data filed at 11/21/2019 0931 Gross per 24 hour  Intake 478 ml  Output --  Net 478 ml    Physical Exam: Vital Signs Blood pressure 105/69, pulse 61, temperature 97.9 F (36.6 C), temperature source Oral, resp. rate 12, height 5\' 7"  (1.702 m), weight 57 kg, SpO2 100 %.  General: No acute distress Mood and affect are appropriate Heart: Regular rate and rhythm no rubs murmurs or extra sounds Lungs: Clear to auscultation, breathing unlabored, no rales or wheezes Abdomen: Positive bowel sounds, soft nontender to palpation, nondistended Extremities: No clubbing, cyanosis, or edema Skin: No evidence of breakdown, no evidence of rash Neurologic: Confused, tangential in conversation Psych: Delayed.  Slowed.  Blunted. Musc: No edema in extremities.  No tenderness in extremities. Neuro: Alert Generalized tremors, worst in right hand- present at rest. Motor:?  4-4+/5 throughout  Assessment/Plan: 1. Functional deficits secondary to acute, progressive dementia which require 3+ hours per day of interdisciplinary therapy in a comprehensive inpatient rehab setting.  Physiatrist is providing close team supervision and 24 hour management of active medical problems listed below.  Physiatrist and rehab team continue to assess barriers to discharge/monitor patient progress toward functional and medical goals  Care Tool:  Bathing    Body parts bathed by  patient: Chest, Abdomen, Left arm, Right arm   Body parts bathed by helper: Front perineal area, Buttocks     Bathing assist Assist Level: Maximal Assistance - Patient 24 - 49%     Upper Body Dressing/Undressing Upper body dressing   What is the patient wearing?: Pull over shirt    Upper body assist Assist Level: Maximal Assistance - Patient 25 - 49%    Lower Body Dressing/Undressing Lower body dressing      What is the patient wearing?: Underwear/pull up, Pants     Lower body assist Assist for lower body dressing: Total Assistance - Patient < 25%     Toileting Toileting    Toileting assist Assist for toileting: Total Assistance - Patient < 25%     Transfers Chair/bed transfer  Transfers assist     Chair/bed transfer assist level: Maximal Assistance - Patient 25 - 49%     Locomotion Ambulation   Ambulation assist   Ambulation activity did not occur: Safety/medical concerns (limited by anxiety and decreased strength/balance)  Assist level: 2 helpers Assistive device: Hand held assist Max distance: 60   Walk 10 feet activity   Assist  Walk 10 feet activity did not occur: Safety/medical concerns  Assist level: 2 helpers Assistive device: Hand held assist   Walk 50 feet activity   Assist Walk 50 feet with 2 turns activity did not occur: Safety/medical concerns (single turn/7ft)  Assist level: 2 helpers Assistive device: Hand held assist    Walk 150 feet activity   Assist Walk 150 feet activity did not occur: Safety/medical concerns         Walk 10 feet on uneven surface  activity   Assist Walk  10 feet on uneven surfaces activity did not occur: Safety/medical concerns         Wheelchair     Assist Will patient use wheelchair at discharge?: No (not anticipated per PT long term goals)   Wheelchair activity did not occur: Safety/medical concerns (limited by decreased cognition and anxiety)         Wheelchair 50 feet with 2  turns activity    Assist    Wheelchair 50 feet with 2 turns activity did not occur: Safety/medical concerns       Wheelchair 150 feet activity     Assist  Wheelchair 150 feet activity did not occur: Safety/medical concerns       Blood pressure 105/69, pulse 61, temperature 97.9 F (36.6 C), temperature source Oral, resp. rate 12, height 5\' 7"  (1.702 m), weight 57 kg, SpO2 100 %.  Medical Problem List and Plan: 1.Progressive decrease in functional mobility, impaired cognition, apraxia with panic attacks generalized weakness and fallssecondary to acute metabolic encephalopathy/progressive dementiaof unknown etiology.  Symptoms most consistent with a functional neurological disorder. This makes sense given negative w/u by neurology and presentation which can fluctuate with anxiety levels. Also she is making generalized progress over the past week. Continue supportive and restorative measures as we are doing.  -Continue Sinemet trial 25-100 mg 3 times daily Continue CIR PT, OT, speech 2. Antithrombotics: -DVT/anticoagulation:Lovenox -antiplatelet therapy: N/A 3. Pain Management:Tylenolas needed. Has headache at times- currently well controlled.  4. Mood:Zoloft 50 mg daily, Valium12.5mg  q12 scheduled -pt sees psychiatry as outpt -antipsychotic agents: N/A  -continue melatonin to assist with sleep, sleep chart  -pt/husband are aware of importance of regular sleep in her regimen  -added prazosin prn for nightmares- did not require overnight. Has been sleeping better.  5. Neuropsych: This patientis notcapable of making decisions on herown behalf-history of dementia. 6. Skin/Wound Care:Routine skin checks 7. Fluids/Electrolytes/Nutrition:encourage po    -resumed megace. Intake still touch and go. Increased to 800mg  bid per home regimen until po picks up   -drinking supplemental shakes    P.o.  intake? Improving with Marinol.  8. Hyper thyroidism. Hold Tapazole 5 mg 3 times daily per #9. continue Inderal 60 mg daily.   -Make referral to Dr. Buddy Duty for end of this month.  9. Elevated LFT's-  LFTs trending downward on 10/12.  -consulted with pharmacy and endocrine.  -?  Secondary to tapazole, DC'd  11. Weight loss/malnutrition/failure to thrive  Added Marinol- 2.5 mg BID- won't stop Megace just to see if it works better  See #7 12.  Hypoalbuminemia  Supplement initiated on 10/11  Albumin 2.9 on 10/12  LOS: 12 days A FACE TO FACE EVALUATION WAS PERFORMED  Charlett Blake 11/21/2019, 1:22 PM

## 2019-11-22 ENCOUNTER — Inpatient Hospital Stay (HOSPITAL_COMMUNITY): Payer: Medicare Other | Admitting: Speech Pathology

## 2019-11-22 NOTE — Progress Notes (Signed)
Speech Language Pathology Daily Session Note  Patient Details  Name: SYRIANNA SCHILLACI MRN: [id number] Date of Birth: 1950/12/05  Today's Date: 11/22/2019 SLP Individual Time: 1050-1115 SLP Individual Time Calculation (min): 25 min  Short Term Goals: Week 2: SLP Short Term Goal 1 (Week 2): Patient will demonstrate sustained attention in 5 minute intervals with max A verbal cues. SLP Short Term Goal 2 (Week 2): Patient will demonstrate orientation to place and time with Max A multimodal cues. SLP Short Term Goal 3 (Week 2): Pt will demonstrate basic problem solving skills by using call bell to request assistance with max A multimodal cues. SLP Short Term Goal 4 (Week 2): Pt will consume dys 3 textures and thin liquid diet with minimal overt s/s aspiration and min A verbal cues for use of swallow strategies. SLP Short Term Goal 5 (Week 2): Pt will increase vocal intensity to 75% intelligibility at the sentence level with max A verbal cues.  Skilled Therapeutic Interventions:  Pt was seen for skilled ST targeting goals for communication and cognition.  Pt appeared very anxious upon therapist's arrival.  She needed max assist multimodal cues to increase vocal intensity to achieve intelligibility at the phrase level in ~50% of opportunities.  She was disoriented to place, date, and situation despite placement of external aids.  She would read aids but then confabulated responses using orientation information (ie when reading that she was at a hospital in [place], she stated "I would just love to live in [place],"  Rather than indicate any understanding that she was currently in [place]).  Pt was perseverative on wanting to play a football game and was briefly able to be redirected to conversations with therapist to state that she follows the "[employer]."  SLP found a TV channel showing pregame football coverage and left it on per pt's request.  Pt was left in bed with bed alarm set and call bell within  reach.  Continue per current plan of care.    Pain Pain Assessment Pain Scale: Faces Faces Pain Scale: No hurt  Therapy/Group: Individual Therapy  Tima Curet, Selinda Orion 11/22/2019, 11:14 AM

## 2019-11-23 ENCOUNTER — Inpatient Hospital Stay (HOSPITAL_COMMUNITY): Payer: Medicare Other

## 2019-11-23 ENCOUNTER — Inpatient Hospital Stay (HOSPITAL_COMMUNITY): Payer: Medicare Other | Admitting: Speech Pathology

## 2019-11-23 NOTE — Progress Notes (Signed)
Speech Language Pathology Daily Session Note  Patient Details  Name: Hannah Padilla MRN: [id number] Date of Birth: 30-Apr-1950  Today's Date: 11/23/2019 SLP Individual Time: 0730-0826 SLP Individual Time Calculation (min): 56 min  Short Term Goals: Week 2: SLP Short Term Goal 1 (Week 2): Patient will demonstrate sustained attention in 5 minute intervals with max A verbal cues. SLP Short Term Goal 2 (Week 2): Patient will demonstrate orientation to place and time with Max A multimodal cues. SLP Short Term Goal 3 (Week 2): Pt will demonstrate basic problem solving skills by using call bell to request assistance with max A multimodal cues. SLP Short Term Goal 4 (Week 2): Pt will consume dys 3 textures and thin liquid diet with minimal overt s/s aspiration and min A verbal cues for use of swallow strategies. SLP Short Term Goal 5 (Week 2): Pt will increase vocal intensity to 75% intelligibility at the sentence level with max A verbal cues.  Skilled Therapeutic Interventions: Pt was seen for skilled ST targeting cognitive and communication goals. Husband was at bedside throughout session. SLP answered husband's questions regarding vocal intensity and attention. Pt required Total A for self feeding of breakfast solids this morning, although was agreeable to take sips of thin liquid from cup herself with Max faded to Mod A verbal and tactile cues. She was disoriented to place and time and unable to focus her attention to aids in room to assist with increasing independence with orientation or to select from multiple choice. Pt required Max A verbal cues for repeating 1-3 word utterances with increased vocal intensity throughout session. Extra time and Max A verbal and visual cues required for following 1-step directions to participate in basic self care tasks. Pt left laying in bed with call bell within reach and husband still present and assisting pt with consuming more breakfast. Continue per current plan of  care.         Pain Pain Assessment Pain Scale: Faces Faces Pain Scale: No hurt  Therapy/Group: Individual Therapy  Arbutus Leas 11/23/2019, 12:13 PM

## 2019-11-23 NOTE — Progress Notes (Signed)
2Occupational Therapy Session Note  Patient Details  Name: Hannah Padilla MRN: [id number] Date of Birth: 01/07/1951  Today's Date: 11/23/2019 OT Individual Time: 1006-1100 OT Individual Time Calculation (min): 54 min   Session 2:  OT Individual Time: 1500-1530 OT Individual Time Calculation (min): 30 min    Short Term Goals: Week 2:  OT Short Term Goal 1 (Week 2): Pt will don shirt with mod A and mod cueing OT Short Term Goal 2 (Week 2): Pt will initiate ADL tasks with mod cueing OT Short Term Goal 3 (Week 2): Pt will complete toilet transfer with mod A  Skilled Therapeutic Interventions/Progress Updates:     Session 1: Pt received supine in bed, asleep and not stirring while OT chatted with her husband [name] in the room. Pt kept her eyes closed and had minimal engagement as she was dependently transferred to sitting EOB where she was able to maintain sitting balance with CGA. Eventually pt opened her eyes with max multimodal cueing. Pt completed sit <> stand from EOB with mod A. Stand pivot transfer to the w/c with max A with max cueing required and pt very anxious. Pt sat in the w/c at the sink for ADLs. She required max multimodal cueing and extra time to bring the toothbrush to her mouth. She was able to brush her teeth with min A for thoroughness. UB bathing and dressing with max A overall and MAX multimodal cueing. [name] present and involved throughout session. Pt completed ambulatory transfer into the bathroom with max +2 HHA with assist of [name] and OT. She required total A for clothing management. She sat on toilet and had no void despite increased time. Pt returned to her w/c and requested to return to bed. Pt was left supine with all needs met.   Session 2: Pt received supine, tired from two previous sessions. Majority of session focused on d/c planning and home accessibility problem solving. Discussed barriers, need to clarify prognosis, burden of care, and goals moving forward.  Discussed equipment needs and ADL transfers. Pt did change her shirt, pulling forward to sit up in bed with mod A and max A to change her shirt. Pt was left supine with all needs met.    Therapy Documentation Precautions:  Precautions Precautions: Fall Precaution Comments: anxious with mobility-resists posteriorly; tremors bilateral hands; watch skin integrity bilateral palms due to clenching fists intermittently Restrictions Weight Bearing Restrictions: No   Therapy/Group: Individual Therapy   H  11/23/2019, 7:22 AM  

## 2019-11-23 NOTE — Progress Notes (Signed)
Physical Therapy Session Note  Patient Details  Name: Hannah Padilla MRN: [id number] Date of Birth: Nov 30, 1950  Today's Date: 11/23/2019 PT Individual Time: 7425-9563 PT Individual Time Calculation (min): 54 min   Short Term Goals: Week 2:  PT Short Term Goal 1 (Week 2): Patient will perform dynamic sitting balance with CGA. PT Short Term Goal 2 (Week 2): Patient will perform basic transfers with mod A of 1 person. PT Short Term Goal 3 (Week 2): Patient will ambulate 10 feet with max A of 1 person using LRAD.  Skilled Therapeutic Interventions/Progress Updates:     Patient in sitting up in the bed eating lunch with her husband assisting. Patient eating well, encouraged patient and her husband to continue to promote increased nutritional intake. PT returned and stated session late, after patient finished eating. Patient in bed with her husband at bedside upon PT arrival. Patient alert and agreeable to PT session. Patient denied pain during session. Patient was anxious throughout session, but with improved participation and mobility today.   Therapeutic Activity: Bed Mobility: Patient performed supine to/from sit with min A and increased time for initiation. Provided verbal cues for moving LE's off the bed before sitting up. Patient self initiated lifting LEs onto the bed to lie down with use of UE's.  Transfers: Patient performed sit to/from stand x1 with max A with significant posterior lean and hip placement leading to a short stand with total A to return to sitting. She then performed sit to/from stand x3 using a RW, had patient place B UEs on RW with [facility] assist for placement for improved forward weight shift with pulling of UEs, Patient's husband provided stabilization of RW and PT provided mod A x2 and min A x1 for standing with improved hip extension and forward weight shift using the RW. Provided verbal cues for forward trunk lean, forward weigh shift, and hip/trunk extension to come to  standing. Patient incontinent of bladder following gait training, see below. Required total A for peri-care and LB clothing management in standing during second standing trial, and performed lateral stepping x3 following final standing trial. Maintained standing balance with mod-min A.   Gait Training:  Patient ambulated 80 feet using RW with mod progressing to max A from therapist and patient's husband instructed to remain in front providing gentle forward propulsion of RW to promote reciprocal stepping pattern with anterior weight shift from patient. Ambulated with increased hip/trunk flexion, posterior bias (leaning on PT behind with fatigue), narrow BOS, and shuffling gait. Provided verbal cues for forward weight shift, increased BOS, and facilitated lateral weight shift to promote increased step length and height.  Patient in bed with her husband and RN at bedside at end of session with breaks locked and all needs within reach.    Therapy Documentation Precautions:  Precautions Precautions: Fall Precaution Comments: anxious with mobility-resists posteriorly; tremors bilateral hands; watch skin integrity bilateral palms due to clenching fists intermittently Restrictions Weight Bearing Restrictions: No   Therapy/Group: Individual Therapy  Arva Slaugh L Madolyn Ackroyd PT, DPT  11/23/2019, 6:34 PM

## 2019-11-23 NOTE — Progress Notes (Signed)
[place] PHYSICAL MEDICINE & REHABILITATION PROGRESS NOTE   Subjective/Complaints: Slept ok last night. Has had generally improving sleep patterns although still not entirely consistent. Very anxious this morning.   ROS: Limited due to cognitive/behavioral   Objective:   No results found. No results for input(s): WBC, HGB, HCT, PLT in the last 72 hours. No results for input(s): NA, K, CL, CO2, GLUCOSE, BUN, CREATININE, CALCIUM in the last 72 hours.  Intake/Output Summary (Last 24 hours) at 11/23/2019 1028 Last data filed at 11/22/2019 1857 Gross per 24 hour  Intake 200 ml  Output --  Net 200 ml    Physical Exam: Vital Signs Blood pressure 118/74, pulse 66, temperature 97.6 F (36.4 C), temperature source Oral, resp. rate 18, height 5\' 7"  (1.702 m), weight 57 kg, SpO2 99 %.  Constitutional: No distress . Vital signs reviewed. HEENT: EOMI, oral membranes moist Neck: supple Cardiovascular: RRR without murmur. No JVD    Respiratory/Chest: CTA Bilaterally without wheezes or rales. Normal effort    GI/Abdomen: BS +, non-tender, non-distended Ext: no clubbing, cyanosis, or edema Psych: anxious, distracted  Musc: No edema in extremities.  No tenderness in extremities. Neuro: Alert Generalized tremors, worst in right hand- present at rest. Rigid left hand improves when distracted. Tends to mouth/whisper phrases. Follows simple commands when directed.      Assessment/Plan: 1. Functional deficits secondary to acute, progressive dementia which require 3+ hours per day of interdisciplinary therapy in a comprehensive inpatient rehab setting.  Physiatrist is providing close team supervision and 24 hour management of active medical problems listed below.  Physiatrist and rehab team continue to assess barriers to discharge/monitor patient progress toward functional and medical goals  Care Tool:  Bathing    Body parts bathed by patient: Chest, Abdomen, Left arm, Right arm   Body  parts bathed by helper: Front perineal area, Buttocks     Bathing assist Assist Level: Maximal Assistance - Patient 24 - 49%     Upper Body Dressing/Undressing Upper body dressing   What is the patient wearing?: Pull over shirt    Upper body assist Assist Level: Maximal Assistance - Patient 25 - 49%    Lower Body Dressing/Undressing Lower body dressing      What is the patient wearing?: Underwear/pull up, Pants     Lower body assist Assist for lower body dressing: Total Assistance - Patient < 25%     Toileting Toileting    Toileting assist Assist for toileting: Total Assistance - Patient < 25%     Transfers Chair/bed transfer  Transfers assist     Chair/bed transfer assist level: Maximal Assistance - Patient 25 - 49%     Locomotion Ambulation   Ambulation assist   Ambulation activity did not occur: Safety/medical concerns (limited by anxiety and decreased strength/balance)  Assist level: 2 helpers Assistive device: Hand held assist Max distance: 60   Walk 10 feet activity   Assist  Walk 10 feet activity did not occur: Safety/medical concerns  Assist level: 2 helpers Assistive device: Hand held assist   Walk 50 feet activity   Assist Walk 50 feet with 2 turns activity did not occur: Safety/medical concerns (single turn/48ft)  Assist level: 2 helpers Assistive device: Hand held assist    Walk 150 feet activity   Assist Walk 150 feet activity did not occur: Safety/medical concerns         Walk 10 feet on uneven surface  activity   Assist Walk 10 feet on uneven surfaces  activity did not occur: Safety/medical concerns         Wheelchair     Assist Will patient use wheelchair at discharge?: No (not anticipated per PT long term goals)   Wheelchair activity did not occur: Safety/medical concerns (limited by decreased cognition and anxiety)         Wheelchair 50 feet with 2 turns activity    Assist    Wheelchair 50 feet  with 2 turns activity did not occur: Safety/medical concerns       Wheelchair 150 feet activity     Assist  Wheelchair 150 feet activity did not occur: Safety/medical concerns       Blood pressure 118/74, pulse 66, temperature 97.6 F (36.4 C), temperature source Oral, resp. rate 18, height 5\' 7"  (1.702 m), weight 57 kg, SpO2 99 %.  Medical Problem List and Plan: 1.Progressive decrease in functional mobility, impaired cognition, apraxia with panic attacks generalized weakness and fallssecondary to acute metabolic encephalopathy/progressive dementiaof unknown etiology.  Symptoms most consistent with a functional neurological disorder. Continue supportive and restorative measures as we are doing.  -Continue Sinemet trial 25-100 mg 3 times daily Continue CIR PT, OT, speech   -anxiety remains biggest, consistent barrier to her functional progress 2. Antithrombotics: -DVT/anticoagulation:Lovenox -antiplatelet therapy: N/A 3. Pain Management:Tylenolas needed. Has headache at times- currently well controlled.  4. Mood:Zoloft 50 mg daily, Valium12.5mg  q12 scheduled -pt sees psychiatry as outpt -antipsychotic agents: N/A  -continue melatonin to assist with sleep, sleep chart  -pt/husband aware of importance of sleep cycle restoration  - prazosin prn for nightmares- not using currently.  5. Neuropsych: This patientis notcapable of making decisions on herown behalf-history of dementia. 6. Skin/Wound Care:Routine skin checks 7. Fluids/Electrolytes/Nutrition:encourage po    -Megace  800mg  bid per home regimen    -Marinol  -eating 50-80% currently 8. Hyper thyroidism. Hold Tapazole 5 mg 3 times daily per #9. continue Inderal 60 mg daily.   -Make referral to Dr. Buddy Duty for end of this month.  9. Elevated LFT's-  LFTs trending downward on 10/12.  -likely Secondary to tapazole, DC'd   -recheck CMET  tomorrow 10.  Hypoalbuminemia  Supplement initiated on 10/11  Albumin 2.9 on 10/12  -check prealbumin tomorrow   LOS: 14 days A FACE TO [place] T Thersea [place] 11/23/2019, 10:28 AM

## 2019-11-24 ENCOUNTER — Inpatient Hospital Stay (HOSPITAL_COMMUNITY): Payer: Medicare Other

## 2019-11-24 ENCOUNTER — Inpatient Hospital Stay (HOSPITAL_COMMUNITY): Payer: Medicare Other | Admitting: Physical Therapy

## 2019-11-24 ENCOUNTER — Inpatient Hospital Stay (HOSPITAL_COMMUNITY): Payer: Medicare Other | Admitting: Speech Pathology

## 2019-11-24 LAB — CBC
HCT: 43.5 % (ref 36.0–46.0)
Hemoglobin: 14.7 g/dL (ref 12.0–15.0)
MCH: 32.5 pg (ref 26.0–34.0)
MCHC: 33.8 g/dL (ref 30.0–36.0)
MCV: 96 fL (ref 80.0–100.0)
Platelets: 391 10*3/uL (ref 150–400)
RBC: 4.53 MIL/uL (ref 3.87–5.11)
RDW: 14.7 % (ref 11.5–15.5)
WBC: 11.2 10*3/uL — ABNORMAL HIGH (ref 4.0–10.5)
nRBC: 0 % (ref 0.0–0.2)

## 2019-11-24 LAB — COMPREHENSIVE METABOLIC PANEL
ALT: 91 U/L — ABNORMAL HIGH (ref 0–44)
AST: 34 U/L (ref 15–41)
Albumin: 3.3 g/dL — ABNORMAL LOW (ref 3.5–5.0)
Alkaline Phosphatase: 170 U/L — ABNORMAL HIGH (ref 38–126)
Anion gap: 9 (ref 5–15)
BUN: 18 mg/dL (ref 8–23)
CO2: 19 mmol/L — ABNORMAL LOW (ref 22–32)
Calcium: 9.5 mg/dL (ref 8.9–10.3)
Chloride: 113 mmol/L — ABNORMAL HIGH (ref 98–111)
Creatinine, Ser: 0.61 mg/dL (ref 0.44–1.00)
GFR, Estimated: >60 mL/min (ref >60)
Glucose, Bld: 93 mg/dL (ref 70–99)
Potassium: 3.2 mmol/L — ABNORMAL LOW (ref 3.5–5.1)
Sodium: 141 mmol/L (ref 135–145)
Total Bilirubin: 1.4 mg/dL — ABNORMAL HIGH (ref 0.3–1.2)
Total Protein: 5.9 g/dL — ABNORMAL LOW (ref 6.5–8.1)

## 2019-11-24 LAB — PREALBUMIN: Prealbumin: 30.4 mg/dL (ref 18–38)

## 2019-11-24 NOTE — Progress Notes (Signed)
Occupational Therapy Session Note  Patient Details  Name: Hannah Padilla MRN: [id number] Date of Birth: Sep 04, 1950  Today's Date: 11/24/2019 OT Individual Time: 0845-1000 OT Individual Time Calculation (min): 75 min   Short Term Goals: Week 2:  OT Short Term Goal 1 (Week 2): Pt will don shirt with mod A and mod cueing OT Short Term Goal 2 (Week 2): Pt will initiate ADL tasks with mod cueing OT Short Term Goal 3 (Week 2): Pt will complete toilet transfer with mod A  Skilled Therapeutic Interventions/Progress Updates:    Pt received sitting in the w/c with no c/o pain, improved anxiety. [name] present. Pt completed sit > stand with min A from the w/c and completed 10 ft of functional mobility with the RW, mod A +2 for safety present. Pt completed transfer into the walk in shower with mod A onto TTB. Discussed accessibility in the home. Pt declined shower or ADLs. Pt completed 15 ft of functional mobility with min A for standing balance, mod-max cueing for LE advancement and min A for RW management. Pt was taken to the therapy gym via w/c. Metronome on 60 bpm used to encourage reciprocal stepping as pt completed 2x 25 ft with RW, with min A +2 required for safety and RW management. Functional mobility completed to mimic home environment ADLs transfers. Pt required several extended rest breaks and mod-max cueing for initiation and sequencing. Pt completed another 20 ft before requesting to sit down and be wheeled back to her room. Pt doffed her shirt with max A overall and max cueing/facilitation. Improved participation/independence with donning shirt, progressing toward mod A. Backward chaining and errorless learning techniques used during dressing. Pt was transferred back to bed and left supine with her husband present, all needs met.   Therapy Documentation Precautions:  Precautions Precautions: Fall Precaution Comments: anxious with mobility-resists posteriorly; tremors bilateral hands; watch skin  integrity bilateral palms due to clenching fists intermittently Restrictions Weight Bearing Restrictions: No   Therapy/Group: Individual Therapy  Curtis Sites 11/24/2019, 7:26 AM

## 2019-11-24 NOTE — Patient Care Conference (Signed)
Inpatient RehabilitationTeam Conference and Plan of Care Update Date: 11/24/2019   Time: 10:01 AM    Patient Name: Hannah Padilla      Medical Record Number: [id number]  Date of Birth: Apr 27, 1950 Sex: Female         Room/Bed: 4W03C/4W03C-01 Payor Info: Payor: [employer] MEDICARE / Plan: [employer] MEDICARE / Product Type: *No Product type* /    Admit Date/Time:  11/09/2019  4:43 PM  Primary Diagnosis:  Progressive dementia with uncertain etiology [facility])  Hospital Problems: Principal Problem:   Progressive dementia with uncertain etiology ([place]) Active Problems:   Acute metabolic encephalopathy   Anxiety disorder due to general medical condition   Hypoalbuminemia due to protein-calorie malnutrition ([place])   Failure to thrive in adult    Expected Discharge Date: Expected Discharge Date: 12/04/19  Team Members Present: Physician leading conference: Dr. Alger Simons Care Coodinator Present: Dorthula Nettles, RN, BSN, CRRN;Loralee Pacas, [place] Nurse Present: Mohammed Kindle, RN PT Present: Apolinar Junes, PT OT Present: Laverle Hobby, OT SLP Present: Weston Anna, SLP PPS Coordinator present : Ileana Ladd, Burna Mortimer, SLP     Current Status/Progress Goal Weekly Team Focus  Bowel/Bladder   Pt is mostly incontinent with bowel/bladder. LBM-11/19/2019, given suppository 10/18 night.  To become more continent of bowel/bladder.  Assess tolieting needs often.   Swallow/Nutrition/ Hydration   Dys. 3 textures with thin liquids, Mod A  Min A  tolerance of currnet diet, use of swallowing compensatory strategies   ADL's   Max cueing for initiation/sequencing, max A ADLs overall, max A toileting. Some improvements in sitting balance, OOB tolerance  mod A overall  Initiation, sequencing, ADL retraining family education, d/c planning   Mobility   Fluctuates based on anxiety and fatigue levels, max-min A overall, improved transfers and gait 80 ft with RW on 10/18  Mod A overall, d/c  w/c goals due to lack of progress  Activity tolerance, cognitive remediation, orientation, functional mobility, midline orientation, gait and stair training, balance, d/c planning, patient/caregiver education   Communication   Mod-Max A  Min-Mod A  use of speech intelligibility strategies, EMST   Safety/Cognition/ Behavioral Observations  Max-Total A  Min A  sustianed attention, basic problem solving, initiation   Pain   No signs of pain or complaints of pain.  To remain pain free.  Assess pain q shift or prn.   Skin   Skin is intact. Ecchymosis to abdomen.  To prevent skin breakdown from occurring.  Assess skin q shift or prn.     Discharge Planning:  D/c to home with 24/7 care from husband.   Team Discussion: Incontinent of B/B at times, doesn't call for assist unless husband is in the room. OT reports max assist overall, has attention deficit, ambulated 25' with min assist. Has mod assist goals. PT reports ambulated 60' mod assist, transfers mod to max assist. SLP reports Dys 3 with thin and intermit cough. Working on cognitive function and sustained attention.  Patient on target to meet rehab goals: yes  *See Care Plan and progress notes for long and short-term goals.   Revisions to Treatment Plan:  Not at time  Teaching Needs: Continue family education  Current Barriers to Discharge: Medical stability, Home enviroment access/layout, Incontinence, Lack of/limited family support, Behavior and anxiety.  Possible Resolutions to Barriers: Need ramp built to access home entrance, timed toileting, continue current medication regimen and management.     Medical Summary Current Status: ongoing anxiety, a little better perhaps than previously.  is eating better. LFT's iimproving  Barriers to Discharge: Behavior;Medical stability   Possible Resolutions to [employer]: ongoing anxiety mgt, daily labs and med mgt.   Continued Need for Acute Rehabilitation Level of Care: The  patient requires daily medical management by a physician with specialized training in physical medicine and rehabilitation for the following reasons: Direction of a multidisciplinary physical rehabilitation program to maximize functional independence : Yes Medical management of patient stability for increased activity during participation in an intensive rehabilitation regime.: Yes Analysis of laboratory values and/or radiology reports with any subsequent need for medication adjustment and/or medical intervention. : Yes   I attest that I was present, lead the team conference, and concur with the assessment and plan of the team.   Cristi Loron 11/24/2019, 1:06 PM

## 2019-11-24 NOTE — Progress Notes (Signed)
[place] PHYSICAL MEDICINE & REHABILITATION PROGRESS NOTE   Subjective/Complaints: Up and in w/c this morning. More alert and interactive per husband. Still anxious.   ROS: Limited due to cognitive/behavioral   Objective:   No results found. Recent Labs    11/24/19 0615  WBC 11.2*  HGB 14.7  HCT 43.5  PLT 391   Recent Labs    11/24/19 0615  NA 141  K 3.2*  CL 113*  CO2 19*  GLUCOSE 93  BUN 18  CREATININE 0.61  CALCIUM 9.5    Intake/Output Summary (Last 24 hours) at 11/24/2019 1003 Last data filed at 11/23/2019 1300 Gross per 24 hour  Intake 100 ml  Output --  Net 100 ml    Physical Exam: Vital Signs Blood pressure (!) 124/92, pulse 74, temperature 97.8 F (36.6 C), temperature source Axillary, resp. rate 18, height 5\' 7"  (1.702 m), weight 57 kg, SpO2 99 %.  Constitutional: No distress . Vital signs reviewed. HEENT: EOMI, oral membranes moist Neck: supple Cardiovascular: RRR without murmur. No JVD    Respiratory/Chest: CTA Bilaterally without wheezes or rales. Normal effort    GI/Abdomen: BS +, non-tender, non-distended Ext: no clubbing, cyanosis, or edema Psych: anxious, distracted  Musc: No edema in extremities.  No tenderness in extremities. Neuro: Alert Generalized tremors, worst in right hand-a little less this morning. Rigid left hand improves when distracted. Tends to mouth/whisper phrases still. Easily distracted by anxiety. Follows simple commands when directed.      Assessment/Plan: 1. Functional deficits secondary to acute, progressive dementia which require 3+ hours per day of interdisciplinary therapy in a comprehensive inpatient rehab setting.  Physiatrist is providing close team supervision and 24 hour management of active medical problems listed below.  Physiatrist and rehab team continue to assess barriers to discharge/monitor patient progress toward functional and medical goals  Care Tool:  Bathing    Body parts bathed by  patient: Chest, Abdomen, Left arm, Right arm   Body parts bathed by helper: Front perineal area, Buttocks     Bathing assist Assist Level: Maximal Assistance - Patient 24 - 49%     Upper Body Dressing/Undressing Upper body dressing   What is the patient wearing?: Pull over shirt    Upper body assist Assist Level: Maximal Assistance - Patient 25 - 49%    Lower Body Dressing/Undressing Lower body dressing      What is the patient wearing?: Underwear/pull up, Pants     Lower body assist Assist for lower body dressing: Total Assistance - Patient < 25%     Toileting Toileting    Toileting assist Assist for toileting: Total Assistance - Patient < 25%     Transfers Chair/bed transfer  Transfers assist     Chair/bed transfer assist level: Maximal Assistance - Patient 25 - 49%     Locomotion Ambulation   Ambulation assist   Ambulation activity did not occur: Safety/medical concerns (limited by anxiety and decreased strength/balance)  Assist level: 2 helpers Assistive device: Hand held assist Max distance: 60   Walk 10 feet activity   Assist  Walk 10 feet activity did not occur: Safety/medical concerns  Assist level: 2 helpers Assistive device: Hand held assist   Walk 50 feet activity   Assist Walk 50 feet with 2 turns activity did not occur: Safety/medical concerns (single turn/25ft)  Assist level: 2 helpers Assistive device: Hand held assist    Walk 150 feet activity   Assist Walk 150 feet activity did not occur: Safety/medical  concerns         Walk 10 feet on uneven surface  activity   Assist Walk 10 feet on uneven surfaces activity did not occur: Safety/medical concerns         Wheelchair     Assist Will patient use wheelchair at discharge?: No (not anticipated per PT long term goals)   Wheelchair activity did not occur: Safety/medical concerns (limited by decreased cognition and anxiety)         Wheelchair 50 feet with 2  turns activity    Assist    Wheelchair 50 feet with 2 turns activity did not occur: Safety/medical concerns       Wheelchair 150 feet activity     Assist  Wheelchair 150 feet activity did not occur: Safety/medical concerns       Blood pressure (!) 124/92, pulse 74, temperature 97.8 F (36.6 C), temperature source Axillary, resp. rate 18, height 5\' 7"  (1.702 m), weight 57 kg, SpO2 99 %.  Medical Problem List and Plan: 1.Progressive decrease in functional mobility, impaired cognition, apraxia with panic attacks generalized weakness and fallssecondary to acute metabolic encephalopathy/progressive dementiaof unknown etiology.  Symptoms most consistent with a functional neurological disorder. Continue supportive and restorative measures as we are doing.  -Continue Sinemet trial 25-100 mg 3 times daily Continue CIR PT, OT, speech   -anxiety remains biggest, consistent barrier to her functional progress 2. Antithrombotics: -DVT/anticoagulation:Lovenox -antiplatelet therapy: N/A 3. Pain Management:Tylenolas needed. Has headache at times- currently well controlled.  4. Mood:Zoloft 50 mg daily, Valium12.5mg  q12 scheduled -pt sees psychiatry as outpt -antipsychotic agents: N/A  -continue melatonin to assist with sleep, sleep chart  -pt/husband aware of importance of sleep cycle restoration  - prazosin prn for nightmares- not using currently.  5. Neuropsych: This patientis notcapable of making decisions on herown behalf-history of dementia. 6. Skin/Wound Care:Routine skin checks 7. Fluids/Electrolytes/Nutrition:encourage po    -Megace  800mg  bid per home regimen    -Marinol  -eating 50-80% most meals 8. Hyper thyroidism. Hold Tapazole 5 mg 3 times daily per #9. continue Inderal 60 mg daily.   -pt will see endocrine as outpt  9. Elevated LFT's-  LFTs trending downward on 10/12 and again  10/19.  -tapazole, DC'd    10.  Hypoalbuminemia  Supplement initiated on 10/11  Albumin 2.9 on 10/12  -prealbumin 30.4 10/19 !   LOS: 15 days A FACE TO FACE EVALUATION WAS PERFORMED  Meredith Staggers 11/24/2019, 10:03 AM

## 2019-11-24 NOTE — Progress Notes (Signed)
Physical Therapy Weekly Progress Note  Patient Details  Name: Hannah Padilla MRN: [id number] Date of Birth: 1950-08-03  Beginning of progress report period: November 17, 2019 End of progress report period: November 24, 2019  Today's Date: 11/24/2019 PT Individual Time: 1035-1105 PT Individual Time Calculation (min): 30 min   Patient has met 3 of 3 short term goals.  Patient with slow progress due to cognitive deficits and increased anxiety limiting participation/functional mobility. Patient's level of function fluctuates from max-min A with bed mobility and max-mod A with transfers and gait up to 80 feet using a RW. She has also performed stairs with max A using B rails.   Patient continues to demonstrate the following deficits muscle weakness and muscle joint tightness, decreased cardiorespiratoy endurance, impaired timing and sequencing, motor apraxia, decreased coordination and decreased motor planning, decreased midline orientation and decreased motor planning, decreased initiation, decreased attention, decreased awareness, decreased problem solving, decreased safety awareness, decreased memory and delayed processing and decreased sitting balance, decreased standing balance, decreased postural control and decreased balance strategies and therefore will continue to benefit from skilled PT intervention to increase functional independence with mobility.  Patient progressing toward long term goals..  Plan of care revisions: Discharged wheelchair goals due to lack of progress..  PT Short Term Goals Week 2:  PT Short Term Goal 1 (Week 2): Patient will perform dynamic sitting balance with CGA. PT Short Term Goal 1 - Progress (Week 2): Met PT Short Term Goal 2 (Week 2): Patient will perform basic transfers with mod A of 1 person. PT Short Term Goal 2 - Progress (Week 2): Met PT Short Term Goal 3 (Week 2): Patient will ambulate 10 feet with max A of 1 person using LRAD. PT Short Term Goal 3 - Progress  (Week 2): Met Week 3:  PT Short Term Goal 1 (Week 3): Patient will perform basic transfer with mod A using LRAD consistently. PT Short Term Goal 2 (Week 3): Patient will ambulate with 50 feet with mod A demonstrating improved foot clearance to reduce fall risk. PT Short Term Goal 3 (Week 3): Patient's husband will initiate hands on training with all mobility.  Skilled Therapeutic Interventions/Progress Updates:     Session 1: Patient in bed asleep with her husband at bedside upon PT arrival. Patient's husband reported that the patient did well with OT this morning, but was very fatigued after session. Focused session on d/c planning and education on home modifications for patient's safety. Discussed goals for w/c level at d/c unless patient demonstrated significant improvement with ambulation and stair training for reduced caregiver burden and patient's safety. Educated patient's husband on Palliative care services and opportunities to promote quality of life for the patient and for him as a caregiver. He reported that he would be very interested in these services to assist with achieving the patient's goals during her stay in CIR and after d/c, MD made aware.   Educated patient's husband on how to build a ramp, provided handout, and discussed ADA guidelines. He had several questions about the bathroom set up and showering for the patient at home, deferred to OT at this time, OT made aware. Discussed home set up for patient at downstairs level and will provide information on chair lift options tomorrow. Patient's husband very appreciative of education and information provided, and very much in agreement with d/c plan for patient to go home next Friday. He stated that he plans to get the ramp supplies later today.   Patient  in bed asleep with her husband at bedsdie at end of session with breaks locked, bed alarm set, and all needs within reach.   Session 2: Patient in bed asleep upon PT arrival.  Patient aroused to verbal and tactile stimulation and agreeable to PT session. Patient denied pain during session, remains anxious with some improvement this session.  Therapeutic Activity: Bed Mobility: Patient performed supine to sit with mod A for LE and trunk management and sit to supine with mod A +2 due to fatigue. Provided verbal cues for performing log rolling for improved leverage with UEs with use of bed rail. Patient sat EOB with min A progressing to close supervision. Took 3 sips of sweet tea with set up assist maintaining sitting balance with supervision.  Transfers: Patient performed sit to/from stand x2 using a RW with mod A and heaving stabilization of RW for patient to pull up with B UEs to promote forward weight shift. Provided verbal cues for hand placement, forward weight shift, and controlled descent due to posterior bias. Patient with improved forward weight shift and hip extension in midline in standing this session with use of RW. Performed toilet transfer using Stedy with mod A of 2 for safety. Patient was unsuccessful with voiding on BSC over the toilet, despite increased time and cues for relaxation. Required total A for peri-care and LB dressing.   Gait Training:  Patient ambulated 70 feet and 15 feet to the bathroom using RW with min-mod A. Ambulated with shuffling gait with very narrow BOS and mild posterior hip placement with hip/trunk flexion. Provided verbal cues for increased step height and BOS for safety. Provided forward propulsion of RW to increased step length and gait speed and reduce posterior bias.   Patient in bed handed off to NT at end of session.    Therapy Documentation Precautions:  Precautions Precautions: Fall Precaution Comments: anxious with mobility-resists posteriorly; tremors bilateral hands; watch skin integrity bilateral palms due to clenching fists intermittently Restrictions Weight Bearing Restrictions: No  Therapy/Group: Individual  Therapy  Norvell Ureste L Lillee Mooneyhan PT, DPT  11/24/2019, 4:28 PM

## 2019-11-24 NOTE — Progress Notes (Addendum)
Patient ID: Hannah Padilla, female   DOB: 08-02-1950, 69 y.o.   MRN: [id number]  SW spoke with pt husband [name] [phone]) to provide updates from team conference with change in pt discharge date from 10/26 to 10/29 to ensure that he has time to get a ramp, and able to have family education completed. During conversation pt husband reported that he building the ramp. He also reported he did not want to rush her progress to return home as he would like for this to be a safe discharge. We were disconnected during session due to his poor cell phone reception. SW text patient husband to encourage him to follow-up to discuss family education next week, and preferred home health agency.   *SW provided pt with above updates on change in d/c date. SW left home health agency list in room.   Loralee Pacas, MSW, [place] Office: [phone] Cell: [phone] Fax: [phone]

## 2019-11-24 NOTE — Progress Notes (Signed)
Physical Therapy Session Note  Patient Details  Name: Hannah Padilla MRN: [id number] Date of Birth: 02/24/1950  Today's Date: 11/24/2019 PT Individual Time: 1300-1330 PT Individual Time Calculation (min): 30 min   Short Term Goals: Week 1:  PT Short Term Goal 1 (Week 1): Patient will perform bed mobility with max A of 1 person consistently. PT Short Term Goal 1 - Progress (Week 1): Met PT Short Term Goal 2 (Week 1): Patient will perform dynamic sitting balance with mod A. PT Short Term Goal 2 - Progress (Week 1): Met PT Short Term Goal 3 (Week 1): Patient will perform transfer with max A of 1 person. PT Short Term Goal 3 - Progress (Week 1): Met Week 2:  PT Short Term Goal 1 (Week 2): Patient will perform dynamic sitting balance with CGA. PT Short Term Goal 2 (Week 2): Patient will perform basic transfers with mod A of 1 person. PT Short Term Goal 3 (Week 2): Patient will ambulate 10 feet with max A of 1 person using LRAD.  Skilled Therapeutic Interventions/Progress Updates:    pt received in bed and agreeable to therapy with head nod. Pt required consistent reassurance and soft speaking throughout session, as she was very anxious about mobility. Pt asked frequently if she was in pain pt was able to shack head no or say no each time, with extra time was able to answer in short phrases or one word answers to questions about family however mostly pt spoke in disjointed phrases or words throughout session. Pt directed in supine>sit mod A with extra time and visual demonstration and target for BLE management. Pt directed in static sitting balance for 5 mins to promote improved upright sitting position as pt had heavy posterior lean at min A- mod A for sitting balance, min A for BLE placement on floor and scooting forward then pt improved to CGA sitting balance EOB. Pt then directed in total of 5 STS from EOB, twice with FWW used and 3 times with Hand held assist pt improved from max A to stand to min A  with extra time required throughout for attention to task, participation, and setup. Pt also directed in static standing for several minutes at min A-mod A and VC for technique and increased BOS. Attempted to directed pt to stand pivot to [facility] however pt ultimately took 5 steps forward with Hand held assist at mod A and 5 steps back to bed with VC as pt unable to process and returned to posterior lean and sway. Pt then directed in sit>supine with mod A for BLE management. Pt left in bed, alarm set, All needs in reach and in good condition. Call button in hand.    Therapy Documentation Precautions:  Precautions Precautions: Fall Precaution Comments: anxious with mobility-resists posteriorly; tremors bilateral hands; watch skin integrity bilateral palms due to clenching fists intermittently Restrictions Weight Bearing Restrictions: No   Therapy/Group: Individual Therapy  Junie Panning 11/24/2019, 2:49 PM

## 2019-11-24 NOTE — Progress Notes (Signed)
Speech Language Pathology Weekly Progress and Session Note  Patient Details  Name: Hannah Padilla MRN: [id number] Date of Birth: [birth year]  Beginning of progress report period: November 16, 2019 End of progress report period: November 24, 2019  Today's Date: 11/24/2019 SLP Individual Time: 4492-0100 SLP Individual Time Calculation (min): 45 min  Short Term Goals: Week 2: SLP Short Term Goal 1 (Week 2): Patient will demonstrate sustained attention in 5 minute intervals with max A verbal cues. SLP Short Term Goal 1 - Progress (Week 2): Not met SLP Short Term Goal 2 (Week 2): Patient will demonstrate orientation to place and time with Max A multimodal cues. SLP Short Term Goal 2 - Progress (Week 2): Met SLP Short Term Goal 3 (Week 2): Pt will demonstrate basic problem solving skills by using call bell to request assistance with max A multimodal cues. SLP Short Term Goal 3 - Progress (Week 2): Not met SLP Short Term Goal 4 (Week 2): Pt will consume dys 3 textures and thin liquid diet with minimal overt s/s aspiration and min A verbal cues for use of swallow strategies. SLP Short Term Goal 4 - Progress (Week 2): Not met SLP Short Term Goal 5 (Week 2): Pt will increase vocal intensity to 75% intelligibility at the sentence level with max A verbal cues. SLP Short Term Goal 5 - Progress (Week 2): Not met    New Short Term Goals: Week 3: SLP Short Term Goal 1 (Week 3): Patient will demonstrate sustained attention in 5 minute intervals with max A verbal cues. SLP Short Term Goal 2 (Week 3): Patient will demonstrate orientation to place and time with Mod A multimodal cues. SLP Short Term Goal 3 (Week 3): Pt will demonstrate basic problem solving skills by using call bell to request assistance with max A multimodal cues. SLP Short Term Goal 4 (Week 3): Pt will consume dys 3 textures and thin liquid diet with minimal overt s/s aspiration and min A verbal cues for use of swallow strategies. SLP Short  Term Goal 5 (Week 3): Pt will increase vocal intensity to 75% intelligibility at the sentence level with max A verbal cues.  Weekly Progress Updates: Patient has made inconsistent gains and has met 1 of 4 STGs this reporting period. Currently, patient is consuming Dys. 3 textures with thin liquids with Max A multimodal cues for initiation and Mod A verbal cues for use of small sips. Patient with intermittent overt s/s of aspiration with decreased attention to bolus at times. Patient also requires overall Max A multimodal cues for use of an increased vocal intensity to achieve ~50% intelligibility at the phrase level. Max-Total A multimodal cues are also needed for initiation with functional tasks, orientation, and attention to basic tasks. Patient and family education ongoing. Patient would benefit from continued skilled SLP intervention to maximize her cognitive, speech and swallowing function prior to discharge.     Intensity: Minumum of 1-2 x/day, 30 to 90 minutes Frequency: 3 to 5 out of 7 days Duration/Length of Stay: 12/01/19 Treatment/Interventions: English as a second language teacher;Dysphagia/aspiration precaution training;Functional tasks;Cognitive remediation/compensation;Environmental controls;Internal/external aids;Patient/family education;Speech/Language facilitation;Therapeutic Activities   Daily Session  Skilled Therapeutic Interventions: Skilled treatment session focused on cognitive goals. Upon arrival, patient was awake and appeared more calm compared to the last time this clinician saw the patient. SLP facilitated session by providing extra time and overall Mod hand over hand assist for self-feeding with breakfast meal of Dys. 3 textures with thin liquids with only one overt s/s of aspiration noted,  due to large sips. Patient requested to use the bathroom and was continent of bladder and incontinent of bowel (despite telling clinician, suspect due to the amount of time it took to get to commode).   Patient scooted to the EOB with extra time and Mod A verbal and tactile cues and transferred to the commode via the stedy. During the transfer, patient was calm and followed commands with overall Min A verbal cues and extra time. Patient was transferred to the wheelchair at end of session. Patient left upright with husband present. Continue with current plan of care.     Pain No/Denies Pain   Therapy/Group: Individual Therapy  ,  11/24/2019, 6:42 AM

## 2019-11-25 ENCOUNTER — Inpatient Hospital Stay (HOSPITAL_COMMUNITY): Payer: Medicare Other

## 2019-11-25 ENCOUNTER — Inpatient Hospital Stay (HOSPITAL_COMMUNITY): Payer: Medicare Other | Admitting: Speech Pathology

## 2019-11-25 ENCOUNTER — Encounter (HOSPITAL_COMMUNITY): Payer: Medicare Other | Admitting: Psychology

## 2019-11-25 MED ORDER — POTASSIUM CHLORIDE CRYS ER 20 MEQ PO TBCR
20.0000 meq | EXTENDED_RELEASE_TABLET | Freq: Every day | ORAL | Status: DC
  Administered 2019-11-25 – 2019-12-02 (×8): 20 meq via ORAL
  Filled 2019-11-25 (×8): qty 1

## 2019-11-25 NOTE — Progress Notes (Signed)
[facility] [facility] PHYSICAL MEDICINE & REHABILITATION PROGRESS NOTE   Subjective/Complaints: Rested well. Asleep when I came in. Slow to awaken.   ROS: Limited due to cognitive/behavioral   Objective:   No results found. Recent Labs    11/24/19 0615  WBC 11.2*  HGB 14.7  HCT 43.5  PLT 391   Recent Labs    11/24/19 0615  NA 141  K 3.2*  CL 113*  CO2 19*  GLUCOSE 93  BUN 18  CREATININE 0.61  CALCIUM 9.5   No intake or output data in the 24 hours ending 11/25/19 0815  Physical Exam: Vital Signs Blood pressure 111/71, pulse (!) 57, temperature 97.7 F (36.5 C), temperature source Oral, resp. rate 15, height 5\' 7"  (1.702 m), weight 57 kg, SpO2 100 %.  Constitutional: No distress . Vital signs reviewed. HEENT: EOMI, oral membranes moist Neck: supple Cardiovascular: RRR without murmur. No JVD    Respiratory/Chest: CTA Bilaterally without wheezes or rales. Normal effort    GI/Abdomen: BS +, non-tender, non-distended Ext: no clubbing, cyanosis, or edema Psych: flat  Musc: No edema in extremities.  No tenderness in extremities. Neuro: slow to awaken this morning Dysphonic. Remains easily distracted by anxiety. Follows simple commands when directed.      Assessment/Plan: 1. Functional deficits secondary to acute, progressive dementia which require 3+ hours per day of interdisciplinary therapy in a comprehensive inpatient rehab setting.  Physiatrist is providing close team supervision and 24 hour management of active medical problems listed below.  Physiatrist and rehab team continue to assess barriers to discharge/monitor patient progress toward functional and medical goals  Care Tool:  Bathing    Body parts bathed by patient: Chest, Abdomen, Left arm, Right arm   Body parts bathed by helper: Front perineal area, Buttocks     Bathing assist Assist Level: Maximal Assistance - Patient 24 - 49%     Upper Body Dressing/Undressing Upper body dressing   What is the  patient wearing?: Pull over shirt    Upper body assist Assist Level: Maximal Assistance - Patient 25 - 49%    Lower Body Dressing/Undressing Lower body dressing      What is the patient wearing?: Underwear/pull up, Pants     Lower body assist Assist for lower body dressing: Total Assistance - Patient < 25%     Toileting Toileting    Toileting assist Assist for toileting: Total Assistance - Patient < 25%     Transfers Chair/bed transfer  Transfers assist     Chair/bed transfer assist level: Moderate Assistance - Patient 50 - 74% Chair/bed transfer assistive device: Programmer, multimedia   Ambulation assist   Ambulation activity did not occur: Safety/medical concerns (limited by anxiety and decreased strength/balance)  Assist level: Moderate Assistance - Patient 50 - 74% Assistive device: Walker-rolling Max distance: 70 ft   Walk 10 feet activity   Assist  Walk 10 feet activity did not occur: Safety/medical concerns  Assist level: Moderate Assistance - Patient - 50 - 74% Assistive device: Walker-rolling   Walk 50 feet activity   Assist Walk 50 feet with 2 turns activity did not occur: Safety/medical concerns (single turn/54ft)  Assist level: Moderate Assistance - Patient - 50 - 74% Assistive device: Walker-rolling    Walk 150 feet activity   Assist Walk 150 feet activity did not occur: Safety/medical concerns         Walk 10 feet on uneven surface  activity   Assist Walk 10 feet on uneven  surfaces activity did not occur: Safety/medical concerns         Wheelchair     Assist Will patient use wheelchair at discharge?: No (not anticipated per PT long term goals)   Wheelchair activity did not occur: Safety/medical concerns (limited by decreased cognition and anxiety)         Wheelchair 50 feet with 2 turns activity    Assist    Wheelchair 50 feet with 2 turns activity did not occur: Safety/medical concerns        Wheelchair 150 feet activity     Assist  Wheelchair 150 feet activity did not occur: Safety/medical concerns   Assist Level: Maximal Assistance - Patient 25 - 49%   Blood pressure 111/71, pulse (!) 57, temperature 97.7 F (36.5 C), temperature source Oral, resp. rate 15, height 5\' 7"  (1.702 m), weight 57 kg, SpO2 100 %.  Medical Problem List and Plan: 1.Progressive decrease in functional mobility, impaired cognition, apraxia with panic attacks generalized weakness and fallssecondary to acute metabolic encephalopathy/progressive dementiaof unknown etiology.  Symptoms most consistent with a functional neurological disorder. Continue supportive and restorative measures as we are doing.  -Continue Sinemet trial 25-100 mg 3 times daily Continue CIR PT, OT, speech   -anxiety remains biggest, consistent barrier to her functional progress  -requested palliative care consult to discuss goals of care, community resources with husband. 2. Antithrombotics: -DVT/anticoagulation:Lovenox -antiplatelet therapy: N/A 3. Pain Management:Tylenolas needed. Has headache at times- currently well controlled.  4. Mood:Zoloft 50 mg daily, Valium12.5mg  q12 scheduled -pt sees psychiatry as outpt -antipsychotic agents: N/A  -continue melatonin to assist with sleep  -sleep not always consistent but improved since admit 5. Neuropsych: This patientis notcapable of making decisions on herown behalf-history of dementia. 6. Skin/Wound Care:Routine skin checks 7. Fluids/Electrolytes/Nutrition:encourage po    -Megace  800mg  bid per home regimen    -Marinol--need to be observant for cognitive side effects  -eating 50-80% most meals  -recent prealbumin was 30 8. Hyper thyroidism. Held Tapazole 5 mg 3 times daily per #9. continue Inderal 60 mg daily.   -pt will see endocrine as outpt  9. Elevated LFT's-  LFTs trending  downward on 10/12 and again 10/19.  -tapazole, DC'd      LOS: 16 days A FACE TO [place] 11/25/2019, 8:15 AM

## 2019-11-25 NOTE — Progress Notes (Signed)
Occupational Therapy Weekly Progress Note  Patient Details  Name: Hannah Padilla MRN: [id number] Date of Birth: 1950/03/04  Beginning of progress report period: November 17, 2019 End of progress report period: November 25, 2019  Today's Date: 11/25/2019 OT Individual Time: 5364-6803 OT Individual Time Calculation (min): 43 min    Patient has met 2 of 3 short term goals.  Pt is making steady progress towards OT goals still requiring near MAX A for functional ADLs, however mobility/funcitonal transfers are improving. Pt requires mod-min A at times for functional transfers as well as demos mildly improved initiation during ADL tasks. Pt would benefit from skilled OT to improve on deficit listed below.  Patient continues to demonstrate the following deficits: muscle weakness, decreased cardiorespiratoy endurance, unbalanced muscle activation and decreased coordination, decreased motor planning and ideational apraxia, decreased initiation, decreased attention, decreased awareness, decreased problem solving, decreased safety awareness, decreased memory and delayed processing and decreased sitting balance, decreased standing balance, decreased postural control and decreased balance strategies and therefore will continue to benefit from skilled OT intervention to enhance overall performance with BADL and iADL.  Patient progressing toward long term goals..  Continue plan of care.  OT Short Term Goals Week 1:  OT Short Term Goal 1 (Week 1): Pt will complete UB dressing with mod assist. OT Short Term Goal 1 - Progress (Week 1): Not met OT Short Term Goal 2 (Week 1): Pt will complete sit <>stand with mod assist in preperation for LB self care. OT Short Term Goal 2 - Progress (Week 1): Not met OT Short Term Goal 3 (Week 1): Pt will complete oral hygiene sitting sinkside with min assist. OT Short Term Goal 3 - Progress (Week 1): Met OT Short Term Goal 4 (Week 1): Pt will bathe UB with mod assist in seated  position. OT Short Term Goal 4 - Progress (Week 1): Met Week 2:  OT Short Term Goal 1 (Week 2): Pt will don shirt with mod A and mod cueing OT Short Term Goal 1 - Progress (Week 2): Progressing toward goal OT Short Term Goal 2 (Week 2): Pt will initiate ADL tasks with mod cueing OT Short Term Goal 2 - Progress (Week 2): Met OT Short Term Goal 3 (Week 2): Pt will complete toilet transfer with mod A OT Short Term Goal 3 - Progress (Week 2): Met OT Short Term Goal 4 (Week 2): Pt will transfer to toilet wiht MIN A OT Short Term Goal 5 (Week 2): Pt will thread 1LE into pants  Skilled Therapeutic Interventions/Progress Updates:    1:1. Pt received in bed extremely anxious and disoriented. Pt requires increased time for orientation stating, "where will I live I dont have a job." Reassurance pt is safe and husband is at home taking care of things provided. Pt reporting 2x need to toilet therefore 2 transfers to [facility] over toilet wit closed chain transfer with grab bar and min-mod A for stand pivot provided with HOH A to initate CM and hygeine (Overall MAX A for toileting). Pt completes oral care at sink with MAX A for sequencing but improved time for initiation. Pt requires MAX A overall for LB dressing but pt able to continue to pull pants from ankles to hips. Exited session with pt seated in bed, exit alarm on and call light in reach and all needs met.   Therapy Documentation Precautions:  Precautions Precautions: Fall Precaution Comments: anxious with mobility-resists posteriorly; tremors bilateral hands; watch skin integrity bilateral palms due to  clenching fists intermittently Restrictions Weight Bearing Restrictions: No General:   Vital Signs: Therapy Vitals Temp: 97.7 F (36.5 C) Temp Source: Oral Pulse Rate: (!) 57 Resp: 15 BP: 111/71 Patient Position (if appropriate): Lying Oxygen Therapy SpO2: 100 % O2 Device: Room Air Pain:   ADL: ADL Grooming: Dependent Where  Assessed-Grooming: Sitting at sink Toileting: Dependent Where Assessed-Toileting: Bed level Vision   Perception    Praxis   Exercises:   Other Treatments:     Therapy/Group: Individual Therapy  [name] 11/25/2019, 6:45 AM

## 2019-11-25 NOTE — Consult Note (Signed)
Neuropsychological Consultation   Patient:   Hannah Padilla   DOB:   05/12/1950  MR Number:  [id number]  Location:  [facility] [id number] [place] [address] Dept: [phone] Loc: [phone]           Date of Service:   11/25/2019  Start Time:   1 PM End Time:   1:40 PM  Provider/Observer:  Hannah Padilla, Psy.D.       Clinical Neuropsychologist       Billing Code/Service: 02409  Chief Complaint:    Hannah Padilla is a 69 year old female with history of hypothyroidism maintained on Tapazole.  Recurrent UTIs, [facility] spotted fever.  Patient is reported to have been functioning normally until early June 2021.  However, there is back medical records that suggest she had had some difficulty with panic events and anxiety going back to 2015 seen in her EMR with a visit to [place]ode, [place] in which the patient had been describing "shakes."  Panic and anxiety were noted.  Husband reported that these episodes typically happen on Mondays and that she is terrified of public speaking and gets very anxious when she is the subject of attention.  These difficulties went back to her school years.  In June 2021 the patient went to the emergency room with a panic attack and had 2 admissions in July with altered mental status and severe anxiety.  MRI was unremarkable.  Patient was tested for Lyme disease and [facility] spotted fever and patient was found to be positive for [facility] spotted fever and treated with antibiotics.  However, there was noted significant cognitive decline and increase in anxiety.  Patient developed shuffling gait, tremor with right hand, masking of face and significant problems with ambulation.  Patient did not initiate conversations or activities.  She deteriorated point that she required assist to standing up and required assistance with bathing and dressing and feeding.  She  developed incontinence of bowel and bladder.  There were no overt hallucinations.  Patient had a prior.  With hallucinations during a UTI infection.  There have been developing short-term memory issues.  There was rigidity in her arms and legs.  Before the development of these current difficulties the patient was healthy and able to complete her ADLs without assistance.  Recent MRI showed mild generalized cortical atrophy and scattered T2 flair hyperintensity foci predominantly in the subcortical and deep white matter of the hemispheres most consistent with chronic microvascular ischemic changes.  No acute abnormalities were noted.  EEG consistent with mild to moderate diffuse encephalopathy.  No seizures noted.  Patient has continued to show slow improvements.  Anxiety continues to be significant issues.  Motor deficits persist with some improvements.  Patient is fearful of being alone and reports increased anxiety at night when trying to sleep.  Reason for Service:  Patient was referred for neuropsychological consultation due to anxeity subsequent to general medical status.  Below is the HPI for the current admission.  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 Presented 10/20/2019 with progressive decline with noted apraxia over the last 3 months as well as intermittent panic attacks generalized weakness and falls. Prior to onset of symptoms patient was healthy able to complete all of her ADLs without assistance. Most recent MRI showed mild generalized cortical atrophy stable compared to scan of  08/11/2019. Scattered T2 FLAIR hyperintense foci predominantly in the subcortical and deep white matter of the hemispheres most consistent with chronic microvascular ischemic change. No acute findings. EEG consistent with mild to moderate diffuse encephalopathy. No seizure. CT of the chest  abdomen pelvis showed no evidence of primary metastatic disease. Admission chemistries potassium 3.4 total protein 6.4 AST 49 ALT 50 hemoglobin 14.7 SARS coronavirus negative, TSH 0.851, free T4 1.96, T3 total 73, T4 total 10.5 ammonia level 17, ESR of 2, paraneoplastic CSF study negative. Neurology follow-up suspect progressive dementia of unknown etiology. She did receive 5 days of IV steroids during her hospital course as well as initiation of Sinemet for trial. She is on a mechanical soft diet thin liquids. Subcutaneous Lovenox for DVT prophylaxis. Thyroid function studies were reviewed by endocrinology Dr. Buddy Duty who recommends follow-up outpatient with ambulatory referral with recommendations to continue Tapazole as well as propranolol. Therapy evaluations completed and patient was admitted for a comprehensive rehab program.  Current Status:  Patient was alone today without husband present.  She was awake and alert.  Mental status being significantly degraded due to anxiety.  She was trembling in arms and hands at times then others would completely stop.  Voice was very soft and low volume.  Voice control impaired.  Limited expressive language and only brief answers to direct questions and no self directed speech other than to talk about anxiety/fear in evening when alone.   Patient reported "stinging eyes"  Patient indicated that she has never had symptoms like this before and while she has had anxiety, she has never been like this before.  She asked about RMSF.    Behavioral Observation: Hannah Padilla  presents as a 69 y.o.-year-old Right Caucasian Female who appeared her stated age. her dress was Appropriate and she was Well Groomed and her manners were Appropriate to the situation.  her participation was indicative of Redirectable behaviors.  There werephysical disabilities noted.  she displayed an appropriate level of cooperation and motivation.     Interactions:    Active Inattentive and  Redirectable  Attention:   abnormal and attention span appeared shorter than expected for age  Memory:   abnormal; global memory impairment noted  Visuo-spatial:  not examined  Speech (Volume):  low  Speech:   normal; slowed speech  Thought Process:  Circumstantial  Though Content:  WNL; not suicidal and not homicidal  Orientation:   person and place  Judgment:   Poor  Planning:   Poor  Affect:    Anxious  Mood:    Anxious  Insight:   Shallow  Intelligence:   normal  Medical History:   Past Medical History:  Diagnosis Date  . Anxiety   . Colon polyp 2015  . Elevated serum free T4 level    husband report  . Farsightedness    wears glasses  . Mood change 2021   major change in affect, health issues with multiple ED visits, hospitalization, starting 07/10/2019  . RMSF [facility] spotted fever) 2021  . Urinary tract infection        Psychiatric History:  Patient does have a history of significant anxiety and panic events with shaking prior to June of this year.  However, significant exacerbation in her neurological status has developed and progressed.  Family Med/Psych History:  Family History  Problem Relation Age of Onset  . Alzheimer's disease Mother   . Other Father        cognitive decline  . COPD  Father        smoker  . Protein S deficiency Other   . Heart disease Neg Hx   . Diabetes Neg Hx   . Cancer Neg Hx   . Stroke Neg Hx   . Hypertension Neg Hx   . Hyperlipidemia Neg Hx    Impression/DX:  Hannah Padilla is a 69 year old female with history of hypothyroidism maintained on Tapazole.  Recurrent UTIs, [facility] spotted fever.  Patient is reported to have been functioning normally until early June 2021.  However, there is back medical records that suggest she had had some difficulty with panic events and anxiety going back to 2015 seen in her EMR with a visit to [place]ode, [place] in which the patient had been describing "shakes."  Panic and anxiety  were noted.  Husband reported that these episodes typically happen on Mondays and that she is terrified of public speaking and gets very anxious when she is the subject of attention.  These difficulties went back to her school years.  In June 2021 the patient went to the emergency room with a panic attack and had 2 admissions in July with altered mental status and severe anxiety.  MRI was unremarkable.  Patient was tested for Lyme disease and [facility] spotted fever and patient was found to be positive for [facility] spotted fever and treated with antibiotics.  However, there was noted significant cognitive decline and increase in anxiety.  Patient developed shuffling gait, tremor with right hand, masking of face and significant problems with ambulation.  Patient did not initiate conversations or activities.  She deteriorated point that she required assist to standing up and required assistance with bathing and dressing and feeding.  She developed incontinence of bowel and bladder.  There were no overt hallucinations.  Patient had a prior.  With hallucinations during a UTI infection.  There have been developing short-term memory issues.  There was rigidity in her arms and legs.  Before the development of these current difficulties the patient was healthy and able to complete her ADLs without assistance.  Recent MRI showed mild generalized cortical atrophy and scattered T2 flair hyperintensity foci predominantly in the subcortical and deep white matter of the hemispheres most consistent with chronic microvascular ischemic changes.  No acute abnormalities were noted.  EEG consistent with mild to moderate diffuse encephalopathy.  No seizures noted.  Patient has continued to show slow improvements.  Anxiety continues to be significant issues.  Motor deficits persist with some improvements.  Patient is fearful of being alone and reports increased anxiety at night when trying to sleep.  Patient was alone today  without husband present.  She was awake and alert.  Mental status being significantly degraded due to anxiety.  She was trembling in arms and hands at times then others would completely stop.  Voice was very soft and low volume.  Voice control impaired.  Limited expressive language and only brief answers to direct questions and no self directed speech other than to talk about anxiety/fear in evening when alone.   Patient reported "stinging eyes"  Patient indicated that she has never had symptoms like this before and while she has had anxiety, she has never been like this before.  She asked about RMSF.    Disposition/Plan:  As far as diagnostic considerations, this does appear to be a multifactorial condition likely.  The patient does have a prior history of panic events, tremors and shakes during anxiety and significant anxiety symptoms  particularly during social challenge and interaction.  The patient has been progressively worsening.  The patient was diagnosed with [facility] spotted fever as well as UTIs and has had significant neurological responses with prior UTIs including developing hallucinations and deteriorations.  The patient's husband reports that the patient's mother also had significant hallucinations and difficulties during UTI infections in the past.  The patient's MRI shows some age-related small vessel disease.  Cortical atrophy but no acute changes.  Patient clearly deteriorated around the time of her [facility] spotted fever diagnosis and this bacterial infection and the neurological worsening and deficits clearly developed and worsened around this infection.  Her symptoms are consistent with [facility] spotted fever neurological involvement but these pre-existing functional neurological deficits and changes in conjunction with vulnerabilities historically likely are playing a role in the severity of her symptoms.  There is likely some degree of functional neurological disorder contributing  to her severity of symptoms but clearly [facility] spotted fever has a significant component to the degree and severity of her illness with anxiety and underlying central nervous system reactivity to bacterial infection in general is present.  Patient with continued anxiety and only small gains so far in rehab.  Diagnosis:    Metabolic encephalopathy - Plan: Ambulatory referral to Neurology         Electronically Signed   _______________________ Hannah Padilla, Psy.D.

## 2019-11-25 NOTE — Progress Notes (Signed)
Physical Therapy Session Note  Patient Details  Name: Hannah Padilla MRN: [id number] Date of Birth: [birth year]  Today's Date: 11/25/2019 PT Individual Time: 2197-5883 PT Individual Time Calculation (min): 75 min   Short Term Goals: Week 1:  PT Short Term Goal 1 (Week 1): Patient will perform bed mobility with max A of 1 person consistently. PT Short Term Goal 1 - Progress (Week 1): Met PT Short Term Goal 2 (Week 1): Patient will perform dynamic sitting balance with mod A. PT Short Term Goal 2 - Progress (Week 1): Met PT Short Term Goal 3 (Week 1): Patient will perform transfer with max A of 1 person. PT Short Term Goal 3 - Progress (Week 1): Met Week 2:  PT Short Term Goal 1 (Week 2): Patient will perform dynamic sitting balance with CGA. PT Short Term Goal 1 - Progress (Week 2): Met PT Short Term Goal 2 (Week 2): Patient will perform basic transfers with mod A of 1 person. PT Short Term Goal 2 - Progress (Week 2): Met PT Short Term Goal 3 (Week 2): Patient will ambulate 10 feet with max A of 1 person using LRAD. PT Short Term Goal 3 - Progress (Week 2): Met Week 3:  PT Short Term Goal 1 (Week 3): Patient will perform basic transfer with mod A using LRAD consistently. PT Short Term Goal 2 (Week 3): Patient will ambulate with 50 feet with mod A demonstrating improved foot clearance to reduce fall risk. PT Short Term Goal 3 (Week 3): Patient's husband will initiate hands on training with all mobility.  Skilled Therapeutic Interventions/Progress Updates:    PAIN  Pt seen this am for 75 min session w/focus on global strength and functional mobility.  Pt initially lying in bed, shaky and tearful stating "they are making me go home today".  Reoriented pt to date using calendar and reiterated DC date of 10/26 using calendar for visual.  "why would they say that?", pt reassured. When turning to collect clothing for day, pt grabbed therapist hand stating "DONT LEAVE ME".  Again assured pt and then  able to proceed.  Pt rolls and side to sit and scooting in sitting w/max tactile cueing to initiate movement, min assist to complete.  In sitting, doffs nightshirt and dons clean shirt w/max tactile cueing, max overall assist, cga for balance. SPT to wc w/max cues, mod assist, reassurance due to fearful w/mobility.  Pt able to brush sides of hair when given hairbrush but max hand over hand assist to complete. Pt transported out of room to dayroom w/assurance from therapist.  States "I don't want to stand!"  Reassured pt and explained purpose of mobility.    STS from wc w/+2 min assist, improved upright posture today w/mild post leand at hips.  Gait 20f w/+2 handhold assist, cues for posture, cues to widen base, cues for increased step length.  Pt transferred from walking to NuStep.  Explained purpose and use of Nustep, max assist for set up on Nustep, hand over hand facilitation for use of Nustep.  Pt immediately stops when not cued.  Performed 2 min L3 x 3 rounds as above for general ROM and strengthening.  STS from Nustep and gait x [id number] described above, cadence and posture slightly improves over distance.  Pt states she needs to use BR.  Transported to room.  SPT to commode using grab bars and mod assist to initiate, min assist and tactile cues for sequencing/wtshiftint/completion to transfer.  Pt total assist  for management of brief and pants.   Pt unable to void after several min on commode.  STS w/mod assist to initiate, min assist to complete, tactile cues for sequencing/hand placement.  Able to stand upright w/hands on grab bar while therapist managed lower body dressing, turn/sit to wc w/mod assist, cues as above.  SPT to bed from wc and sit to supine again w/mod assist for initiation of task, min assist to complete, cues as above. In suipne pt lies rigidly w/LEs and UEs moderately flexed but is able to extend w/tactle and verbal cues. Pt left supine w/rails up x 4, alarm set, bed  in lowest position, and needs in reach. Discussed possibility of having recliner in room as alternative to bed, but nursing states husband stays in room some nights and need for pullout in room.  May consider TIS or recliner as option.   Therapy Documentation Precautions:  Precautions Precautions: Fall Precaution Comments: anxious with mobility-resists posteriorly; tremors bilateral hands; watch skin integrity bilateral palms due to clenching fists intermittently Restrictions Weight Bearing Restrictions: No    Therapy/Group: Individual Therapy  Callie Fielding, [place] 11/25/2019, 12:45 PM

## 2019-11-25 NOTE — Progress Notes (Signed)
Occupational Therapy Session Note  Patient Details  Name: Hannah Padilla MRN: [id number] Date of Birth: 10-19-1950  Today's Date: 11/25/2019 OT Individual Time: 1445-1515 OT Individual Time Calculation (min): 30 min    Short Term Goals: Week 1:  OT Short Term Goal 1 (Week 1): Pt will complete UB dressing with mod assist. OT Short Term Goal 1 - Progress (Week 1): Not met OT Short Term Goal 2 (Week 1): Pt will complete sit <>stand with mod assist in preperation for LB self care. OT Short Term Goal 2 - Progress (Week 1): Not met OT Short Term Goal 3 (Week 1): Pt will complete oral hygiene sitting sinkside with min assist. OT Short Term Goal 3 - Progress (Week 1): Met OT Short Term Goal 4 (Week 1): Pt will bathe UB with mod assist in seated position. OT Short Term Goal 4 - Progress (Week 1): Met  Skilled Therapeutic Interventions/Progress Updates:    1:1. Pt received in bed agreeable to OT. Pt completes SPT with and without RW with mod A overall d/t posterior lean with VC for initiation. Pt and OT wash pt hair with HOH A for pt participation. Pt requires MAX A for washing and drying hair. Pt requires MAX A to don shirt after washing hair and MIN A for SPT with grab bar to/from toilet. totla A for CM and unable to void bladder. Increased time provided to process concise multimodal cuing. Exited session with pt saete din bed, exit alarm on and call light in reach  Therapy Documentation Precautions:  Precautions Precautions: Fall Precaution Comments: anxious with mobility-resists posteriorly; tremors bilateral hands; watch skin integrity bilateral palms due to clenching fists intermittently Restrictions Weight Bearing Restrictions: No General:   Vital Signs: Therapy Vitals Temp: (!) 97.4 F (36.3 C) Temp Source: Oral Pulse Rate: 65 Resp: 17 BP: 101/69 Patient Position (if appropriate): Lying Oxygen Therapy SpO2: 100 % O2 Device: Room Air Pain: Pain Assessment Pain Scale:  Faces Faces Pain Scale: No hurt ADL: ADL Grooming: Dependent Where Assessed-Grooming: Sitting at sink Toileting: Dependent Where Assessed-Toileting: Bed level Vision   Perception    Praxis   Exercises:   Other Treatments:     Therapy/Group: Individual Therapy  [name] 11/25/2019, 3:41 PM

## 2019-11-25 NOTE — Progress Notes (Signed)
Speech Language Pathology Daily Session Note  Patient Details  Name: Hannah Padilla MRN: [id number] Date of Birth: 1950/05/06  Today's Date: 11/25/2019 SLP Individual Time: 1101-1156 SLP Individual Time Calculation (min): 55 min  Short Term Goals: Week 3: SLP Short Term Goal 1 (Week 3): Patient will demonstrate sustained attention in 5 minute intervals with max A verbal cues. SLP Short Term Goal 2 (Week 3): Patient will demonstrate orientation to place and time with Mod A multimodal cues. SLP Short Term Goal 3 (Week 3): Pt will demonstrate basic problem solving skills by using call bell to request assistance with max A multimodal cues. SLP Short Term Goal 4 (Week 3): Pt will consume dys 3 textures and thin liquid diet with minimal overt s/s aspiration and min A verbal cues for use of swallow strategies. SLP Short Term Goal 5 (Week 3): Pt will increase vocal intensity to 75% intelligibility at the sentence level with max A verbal cues.  Skilled Therapeutic Interventions: Pt was seen for skilled ST targeting cognitive-linguistic goals. Pt was more alert today in comparison to this therapist's last visit. Although still disoriented, she demonstrated ability to focus attention on external aids to read information to reorient. She required significant amount of extra time and encouragement to complete basic self care tasks, overall Mod-Max A for initiation and sequencing. She became extremely anxious when SLP attempted to assist pt with getting out of bed or even sitting at edge of bed, therefore task abandoned and focused in bed level activities. She verbalized red button was correct one to push to request assistance using call bell, but unable to demonstrate without Max A verbal to for visual focus and hand over hand assist to press. She required Max faded to Mod tactile and verbal assist to use EMST device set to 13 cm H2O to perform 1 set of 10 exercises. Overall she was more verbally engaged and  attempted to answer most of SLP's questions, but minimally responsive to cueing for increased vocal intensity. Intelligibility remains near 50-60% at this time, due to her decreased vocal intensity. She was somewhat perseverative on the idea of "getting bread" today, requiring Moderate cues for redirection to functional tasks and topics. Pt left laying in bed with alarm set and needs within reach. Continue per current plan of care.          Pain Pain Assessment Pain Scale: Faces Faces Pain Scale: No hurt  Therapy/Group: Individual Therapy  Arbutus Leas 11/25/2019, 12:03 PM

## 2019-11-26 ENCOUNTER — Inpatient Hospital Stay (HOSPITAL_COMMUNITY): Payer: Medicare Other

## 2019-11-26 ENCOUNTER — Inpatient Hospital Stay (HOSPITAL_COMMUNITY): Payer: Medicare Other | Admitting: Speech Pathology

## 2019-11-26 DIAGNOSIS — Z515 Encounter for palliative care: Secondary | ICD-10-CM

## 2019-11-26 DIAGNOSIS — Z7189 Other specified counseling: Secondary | ICD-10-CM

## 2019-11-26 NOTE — Progress Notes (Signed)
Physical Therapy Session Note  Patient Details  Name: Hannah Padilla MRN: [id number] Date of Birth: 1950/07/08  Today's Date: 11/26/2019 PT Individual Time: 7209-4709 PT Individual Time Calculation (min): 30 min   Short Term Goals: Week 1:  PT Short Term Goal 1 (Week 1): Patient will perform bed mobility with max A of 1 person consistently. PT Short Term Goal 1 - Progress (Week 1): Met PT Short Term Goal 2 (Week 1): Patient will perform dynamic sitting balance with mod A. PT Short Term Goal 2 - Progress (Week 1): Met PT Short Term Goal 3 (Week 1): Patient will perform transfer with max A of 1 person. PT Short Term Goal 3 - Progress (Week 1): Met Week 2:  PT Short Term Goal 1 (Week 2): Patient will perform dynamic sitting balance with CGA. PT Short Term Goal 1 - Progress (Week 2): Met PT Short Term Goal 2 (Week 2): Patient will perform basic transfers with mod A of 1 person. PT Short Term Goal 2 - Progress (Week 2): Met PT Short Term Goal 3 (Week 2): Patient will ambulate 10 feet with max A of 1 person using LRAD. PT Short Term Goal 3 - Progress (Week 2): Met Week 3:  PT Short Term Goal 1 (Week 3): Patient will perform basic transfer with mod A using LRAD consistently. PT Short Term Goal 2 (Week 3): Patient will ambulate with 50 feet with mod A demonstrating improved foot clearance to reduce fall risk. PT Short Term Goal 3 (Week 3): Patient's husband will initiate hands on training with all mobility.  Skilled Therapeutic Interventions/Progress Updates:    PAIN pt denies pain initially.  During last 59f of gait reports knee pain but cannot describe or quantify.  Relieved when transferred to bed.  Pt initially seated in wc holding blanket in air and shaking.  When asked what is wrong pt states "I am stuck".    When therapist attempts to cover pt w/blanket she exclaims "Get it off!"  Pt very anxious and confused, but asks if she can get in bed when we are finished "with all this".  Therapist  agreed to assist to bed at end of session and time required to calm patient.  Pt asking about "man with the beady eyes"..... Pt transported to the hallway for gait training w/RW to work on use of functional AD.  STS w/mod assist, post tendency w/transition but able to stand w/RW w/cga once more upright posture achieved. Pt then ambulates w/shuffling narrow based gait pattern, very slow cadence, moderate post lean w/therapist progressing walker to promote forward progression and providing manual assist at pelvis for ant wt shift.   Gt as above x 1283f turn/sit to bed w/use of bedrail and min assist,additional time, heavy tactile cuing.  Pt able to sit w/cga, overall rigid appearance in all positions. Sit to supine w/heavy tactile cueing for sequencing/initiation of movement. Again, appears rigid in supine and additional time needed to achieve extension of UE's/LEs and trunk in bed. Pt assisted w/drinking tea/thickened w/hand over hand assist to bring cup to mouth, takes small sips and slow to swallow.  Pt repositioned comfortably which takes significant additional time and communication is very unclear/pt w/whisper quality to voice and mixes nonsensical phrases w/communication of needs.  Pt left supine w/rails up x 4, alarm set, bed in lowest position, and needs in reach.   Therapy Documentation Precautions:  Precautions Precautions: Fall Precaution Comments: anxious with mobility-resists posteriorly; tremors bilateral hands; watch skin integrity bilateral  palms due to clenching fists intermittently Restrictions Weight Bearing Restrictions: No    Therapy/Group: Individual Therapy  Callie Fielding, [place] 11/26/2019, 4:06 PM

## 2019-11-26 NOTE — Plan of Care (Signed)
  Problem: Consults Goal: RH GENERAL PATIENT EDUCATION Description: See Patient Education module for education specifics. Outcome: Progressing Goal: Skin Care Protocol Initiated - if Braden Score 18 or less Description: If consults are not indicated, leave blank or document N/A Outcome: Progressing Goal: Nutrition Consult-if indicated Outcome: Progressing   Problem: RH BOWEL ELIMINATION Goal: RH STG MANAGE BOWEL WITH ASSISTANCE Description: STG Manage Bowel with min Assistance. Outcome: Progressing Goal: RH STG MANAGE BOWEL W/MEDICATION W/ASSISTANCE Description: STG Manage Bowel with Medication with min Assistance. Outcome: Progressing   Problem: RH BLADDER ELIMINATION Goal: RH STG MANAGE BLADDER WITH ASSISTANCE Description: STG Manage Bladder With min Assistance Outcome: Progressing   Problem: RH SKIN INTEGRITY Goal: RH STG SKIN FREE OF INFECTION/BREAKDOWN Description: No new skin breakdown this admission with min assist  Outcome: Progressing Goal: RH STG MAINTAIN SKIN INTEGRITY WITH ASSISTANCE Description: STG Maintain Skin Integrity With min Assistance. Outcome: Progressing   Problem: RH SAFETY Goal: RH STG ADHERE TO SAFETY PRECAUTIONS W/ASSISTANCE/DEVICE Description: STG Adhere to Safety Precautions With min Assistance/Device. Outcome: Progressing Goal: RH STG DECREASED RISK OF FALL WITH ASSISTANCE Description: STG Decreased Risk of Fall With min Assistance. Outcome: Progressing   Problem: RH PAIN MANAGEMENT Goal: RH STG PAIN MANAGED AT OR BELOW PT'S PAIN GOAL Description: Less than 3 out of 10 Outcome: Progressing   Problem: RH KNOWLEDGE DEFICIT GENERAL Goal: RH STG INCREASE KNOWLEDGE OF SELF CARE AFTER HOSPITALIZATION Description: Pt will be able to adhere to medication regimen with min assist from family.  Outcome: Progressing

## 2019-11-26 NOTE — Progress Notes (Signed)
Physical Therapy Session Note  Patient Details  Name: Hannah Padilla MRN: [id number] Date of Birth: [birth year]  Today's Date: 11/26/2019 PT Individual Time: 1340-1420 PT Individual Time Calculation (min): 40 min   Short Term Goals: Week 3:  PT Short Term Goal 1 (Week 3): Patient will perform basic transfer with mod A using LRAD consistently. PT Short Term Goal 2 (Week 3): Patient will ambulate with 50 feet with mod A demonstrating improved foot clearance to reduce fall risk. PT Short Term Goal 3 (Week 3): Patient's husband will initiate hands on training with all mobility.  Skilled Therapeutic Interventions/Progress Updates:     Patient in bed watching TV upon PT arrival. Patient alert and agreeable to PT session. Patient denied pain during session. Patient with very soft vocalization and difficult to understand throughout session.   Therapeutic Activity: Bed Mobility: Patient performed supine to sit with min A with HOB elevated and use of bed rail. Provided verbal cues and HOH assist for use of bed rail. Patient sat EOB with close supervision for safety, provided cues for hands on knees to reduce posterior bias.  Transfers: Patient performed sit to/from stand x1 with max A and heavy posterior lean without AD and performed squat pivot transfer bed>w/c with max A. Provided verbal cues for forward trunk lean, forward weight shift, and blocked B feet to reduced anterior slide.  Wheelchair Mobility:  Patient was transported in the w/c with total A throughout session for energy conservation and time management.  Neuromuscular Re-ed: Patient performed the following standing balance and motor control activities: -sit to stand with B rails x2 focused on forward weight shift and hip extension to come to stand -standing balance x2 focused on midline orientation holding B rails for improved anterior weight shift -step-up on 1 step x1, required significantly increased time for sequencing and following  multimodal cues, progressive posterior lean when up on the step requiring max A for support  Patient in w/c in the room at end of session, RN aware, with breaks locked, seat belt alarm set, and all needs within reach.    Therapy Documentation Precautions:  Precautions Precautions: Fall Precaution Comments: anxious with mobility-resists posteriorly; tremors bilateral hands; watch skin integrity bilateral palms due to clenching fists intermittently Restrictions Weight Bearing Restrictions: No   Therapy/Group: Individual Therapy  Nautica Hotz L Ludia Gartland PT, DPT  11/26/2019, 4:25 PM

## 2019-11-26 NOTE — Progress Notes (Signed)
Speech Language Pathology Daily Session Note  Patient Details  Name: Hannah Padilla MRN: [id number] Date of Birth: Nov 24, 1950  Today's Date: 11/26/2019 SLP Individual Time: 0725-0825 SLP Individual Time Calculation (min): 60 min  Short Term Goals: Week 3: SLP Short Term Goal 1 (Week 3): Patient will demonstrate sustained attention in 5 minute intervals with max A verbal cues. SLP Short Term Goal 2 (Week 3): Patient will demonstrate orientation to place and time with Mod A multimodal cues. SLP Short Term Goal 3 (Week 3): Pt will demonstrate basic problem solving skills by using call bell to request assistance with max A multimodal cues. SLP Short Term Goal 4 (Week 3): Pt will consume dys 3 textures and thin liquid diet with minimal overt s/s aspiration and min A verbal cues for use of swallow strategies. SLP Short Term Goal 5 (Week 3): Pt will increase vocal intensity to 75% intelligibility at the sentence level with max A verbal cues.  Skilled Therapeutic Interventions: Skilled treatment session focused on cognitive goals. Upon arrival, patient reported she needed to use the commode. Patient was transferred to the commode via the [facility] and required Max A verbal cues for sequencing with task. Patient was incontinent of urine and requested to go back to bed. However, once sitting edge of bed the patient requested to have a bowel movement. Patient was then transferred again to the commode and was continent of bowel. SLP also facilitated session by utilizing a universal cuff to assist with self-feeding. While sitting edge of bed, atient was able to scoop the food and brought the spoon to her mouth with overall extra time and Mod A verbal and tactile cues. Extra time and Mod A verbal cues were also needed to bring the cup to her mouth for drinking. Patient transferred back to bed at end of session and left with alarm on and all needs within reach. Continue with current plan of care.      Pain Pain  Assessment Pain Scale: Faces Faces Pain Scale: No hurt  Therapy/Group: Individual Therapy  Poppi Scantling, [place] 11/26/2019, 12:21 PM

## 2019-11-26 NOTE — Consult Note (Signed)
Consultation Note Date: 11/26/2019   Patient Name: Hannah Padilla  DOB: 18-May-1950  MRN: [id number]  Age / Sex: 69 y.o., female  PCP: Hannah Lloyd Camelia Eng, PA-C Referring Physician: Meredith Staggers, MD  Reason for Consultation: Establishing goals of care and Psychosocial/spiritual support  HPI/Patient Profile: 69 y.o. female  admitted on 11/09/2019 with PMH history of hyperthyroidism maintained on Tapazole, recurrent UTIs, [place] spotted fever.   Presented 10/20/2019 with progressive decline with noted apraxia over the last 3 months as well as intermittent panic attacks generalized weakness and falls. Prior to onset of symptoms patient was healthy able to complete all of her ADLs without assistance. Most recent MRI showed mild generalized cortical atrophy stable compared to scan of 08/11/2019.   Scattered T2 FLAIR hyperintense foci predominantly in the subcortical and deep white matter of the hemispheres most consistent with chronic microvascular ischemic change. No acute findings.   EEG consistent with mild to moderate diffuse encephalopathy. No seizure. CT of the chest abdomen pelvis showed no evidence of primary metastatic disease.   Admission chemistries potassium 3.4 total protein 6.4 AST 49 ALT 50 hemoglobin 14.7 SARS coronavirus negative, TSH 0.851, free T4 1.96, T3 total 73, T4 total 10.5 ammonia level 17, ESR of 2, paraneoplastic CSF study negative.  Neurology follow-up suspect progressive dementia of unknown etiology. She did receive 5 days of IV steroids during her hospital course as well as initiation of Sinemet for trial.   She is on a mechanical soft diet thin liquids. Subcutaneous Lovenox for DVT prophylaxis. Thyroid function studies were reviewed by endocrinology Dr. Buddy Padilla who recommends follow-up outpatient with ambulatory referral with recommendations to continue Tapazole as well as  propranolol. Therapy evaluations completed and patient was admitted for a comprehensive rehab program.  Per her husband she has "made great progress" in CIR.   However she remains dependant for most ADLs and will need 24/7 care  Family face treatment option decisions, advanced directive decisions and anticipatory care needs  Clinical Assessment and Goals of Care:   This NP Hannah Padilla reviewed medical records, received report from team, assessed the patient and then meet at the patient's bedside along with her husband  to discuss diagnosis, prognosis, GOC, EOL wishes disposition and options.   Concept of Palliative Care was introduced as specialized medical care for people and their families living with serious illness.  If focuses on providing relief from the symptoms and stress of a serious illness.  The goal is to improve quality of life for both the patient and the family.  Created space and opportunity for husband to explore his thoughts and feelings regarding his wife's current medical situation.   He shares his concerns; patient was fully functional five months ago.  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  They have a close friend network.     Ultimately hope is to return home and continue to make improvements.  Patient's husband understands the seriousness of the situation and the possibilities and what ifs. He  is tearful considering that the situation may not be sustainable long term at home.   In home assistance options are limited.     A  discussion was had today regarding advanced directives.  Concepts specific to code status, artifical feeding and hydration, continued IV antibiotics and rehospitalization was had.  The difference between a aggressive medical intervention path  and a palliative comfort care path for this patient at this time was had.  Values and goals of care important to patient and family were attempted to be elicited.  Education offered on the  consideration and documentation of ACP documents impressed.    MOST form completed.  At this time husband is open to all offered and available medical interventions to prolong life.  No feeding tube now or in the future.   He speaks to never wanting his wife to just exist on artifical support.   Education offered on OP community based palliative services and hospice benefit.  Questions and concerns addressed.  Patient  encouraged to call with questions or concerns.     PMT will continue to support holistically.   NEXT OF KIN/spouse    SUMMARY OF RECOMMENDATIONS    Code Status/Advance Care Planning:  Full code   Family is open to all offered and available medical ntervetnions to prolong life.   Palliative Prophylaxis:   Aspiration, Bowel Regimen, Delirium Protocol, Frequent Pain Assessment and Oral Care  Additional Recommendations (Limitations, Scope, Preferences):  Full Scope Treatment  Psycho-social/Spiritual:   Desire for further Chaplaincy support:no  Additional Recommendations: Education on Hospice  Prognosis:   Unable to determine  Discharge Planning: Home with [facility] palliative services, discussed with Hannah Padilla/LCSW      Primary Diagnoses: Present on Admission: . Acute metabolic encephalopathy   I have reviewed the medical record, interviewed the patient and family, and examined the patient. The following aspects are pertinent.  Past Medical History:  Diagnosis Date  . Anxiety   . Colon polyp 2015  . Elevated serum free T4 level    husband report  . Farsightedness    wears glasses  . Mood change 2021   major change in affect, health issues with multiple ED visits, hospitalization, starting 07/10/2019  . [facility] [place] spotted fever) 2021  . Urinary tract infection    Social History   Socioeconomic History  . Marital status: Married    Spouse name: Not on file  . Number of children: Not on file  . Years of education: Not on  file  . Highest education level: Not on file  Occupational History  . Not on file  Tobacco Use  . Smoking status: Never Smoker  . Smokeless tobacco: Never Used  Vaping Use  . Vaping Use: Never used  Substance and Sexual Activity  . Alcohol use: Not Currently    Alcohol/week: 5.0 standard drinks    Types: 5 Glasses of wine per week    Comment: none in a  year (as of 10/08/19)  . Drug use: No  . Sexual activity: Not Currently    Birth control/protection: Post-menopausal    Comment: 1st intercourse 69 yo-More than 5 partners  Other Topics Concern  . Not on file  Social History Narrative   Married, no children.  Retired, was working for [employer] as a Geophysicist/field seismologist.   Exercise - not much.   07/2018.        Right handed   Lives at home with husband    Social Determinants of Health  Financial Resource Strain:   . Difficulty of Paying Living Expenses: Not on file  Food Insecurity:   . Worried About Charity fundraiser in the Last Year: Not on file  . Ran Out of Food in the Last Year: Not on file  Transportation Needs:   . Lack of Transportation (Medical): Not on file  . Lack of Transportation (Non-Medical): Not on file  Physical Activity:   . Days of Exercise per Week: Not on file  . Minutes of Exercise per Session: Not on file  Stress:   . Feeling of Stress : Not on file  Social Connections:   . Frequency of Communication with Friends and Family: Not on file  . Frequency of Social Gatherings with Friends and Family: Not on file  . Attends Religious Services: Not on file  . Active Member of Clubs or Organizations: Not on file  . Attends Archivist Meetings: Not on file  . Marital Status: Not on file   Family History  Problem Relation Age of Onset  . Alzheimer's disease Mother   . Other Father        cognitive decline  . COPD Father        smoker  . Protein S deficiency Other   . Heart disease Neg Hx   . Diabetes Neg Hx   . Cancer Neg Hx   . Stroke Neg Hx     . Hypertension Neg Hx   . Hyperlipidemia Neg Hx    Scheduled Meds: . (feeding supplement) PROSource Plus  30 mL Oral BID BM  . carbidopa-levodopa  1 tablet Oral TID  . diazepam  2.5 mg Oral Q12H  . dronabinol  2.5 mg Oral BID AC  . enoxaparin (LOVENOX) injection  40 mg Subcutaneous Q24H  . feeding supplement  1 Container Oral BID BM  . megestrol  800 mg Oral BID  . melatonin  3 mg Oral QHS  . pantoprazole  40 mg Oral Daily  . polyethylene glycol  17 g Oral Daily  . potassium chloride  20 mEq Oral Daily  . propranolol ER  60 mg Oral Daily  . sertraline  50 mg Oral Daily  . Vitamin D (Ergocalciferol)  50,000 Units Oral Q Tue   Continuous Infusions: PRN Meds:.acetaminophen **OR** acetaminophen, albuterol, bisacodyl, bisacodyl, ondansetron **OR** ondansetron (ZOFRAN) IV, polyvinyl alcohol, prazosin Medications Prior to Admission:  Prior to Admission medications   Medication Sig Start Date End Date Taking? Authorizing Provider  acetaminophen (TYLENOL) 325 MG tablet Take 2 tablets (650 mg total) by mouth every 6 (six) hours as needed for mild pain (or Fever >/= 101). 08/22/19   Elgergawy, Silver Huguenin, MD  bisacodyl (DULCOLAX) 10 MG suppository Place 1 suppository (10 mg total) rectally daily as needed for moderate constipation. 11/08/19   Amin, Jeanella Flattery, MD  bisacodyl (DULCOLAX) 5 MG EC tablet Take 10 mg by mouth daily as needed for moderate constipation.    [provider]  carbidopa-levodopa (SINEMET IR) 25-100 MG tablet Take 1 tablet by mouth 3 (three) times daily. 11/03/19   Amin, Jeanella Flattery, MD  diazepam (VALIUM) 5 MG tablet Take 0.5 tablets (2.5 mg total) by mouth every 12 (twelve) hours as needed for anxiety (sleep). 11/03/19   Amin, Jeanella Flattery, MD  methimazole (TAPAZOLE) 5 MG tablet Take 1 tablet (5 mg total) by mouth 3 (three) times daily. 10/06/19   Tysinger, Camelia Eng, PA-C  omeprazole (PRILOSEC) 40 MG capsule Take 1 capsule by mouth once daily  Patient taking differently:  Take 40 mg by mouth daily.  11/12/18   Tysinger, Camelia Eng, PA-C  ondansetron (ZOFRAN-ODT) 8 MG disintegrating tablet Take 1 tablet (8 mg total) by mouth every 8 (eight) hours as needed. Patient taking differently: Take 8 mg by mouth every 8 (eight) hours as needed for nausea or vomiting.  09/10/19   Tysinger, Camelia Eng, PA-C  polyethylene glycol (MIRALAX / GLYCOLAX) 17 g packet Take 17 g by mouth daily. 11/08/19   Amin, Jeanella Flattery, MD  propranolol ER (INDERAL LA) 60 MG 24 hr capsule Take 1 capsule (60 mg total) by mouth daily. 10/06/19 10/05/20  Tysinger, Camelia Eng, PA-C  sertraline (ZOLOFT) 50 MG tablet Take 50 mg by mouth daily. 09/24/19   [provider]  Vitamin D, Ergocalciferol, (DRISDOL) 1.25 MG (50000 UNIT) CAPS capsule Take 1 capsule (50,000 Units total) by mouth every 7 (seven) days. Patient taking differently: Take 50,000 Units by mouth every Wednesday.  07/22/19   Tysinger, Camelia Eng, PA-C   Allergies  Allergen Reactions  . Methimazole     Possible cause of transaminitis   Review of Systems  Unable to perform ROS: Mental status change    Physical Exam Constitutional:      Appearance: She is ill-appearing.  Cardiovascular:     Rate and Rhythm: Normal rate.  Skin:    General: Skin is warm and dry.  Neurological:     Mental Status: She is alert.  Psychiatric:        Speech: Speech is delayed.        Cognition and Memory: Cognition is impaired.     Vital Signs: BP 119/85 (BP Location: Left Arm)   Pulse 69   Temp 98.3 F (36.8 C)   Resp 18   Ht _0  (1.702 m)   Wt 57 kg   SpO2 100%   BMI 19.68 kg/m  Pain Scale: 0-10 POSS *See Group Information*: S-Acceptable,Sleep, easy to arouse Pain Score: 0-No pain   SpO2: SpO2: 100 % O2 Device:SpO2: 100 % O2 Flow Rate: .   IO: Intake/output summary:   Intake/Output Summary (Last 24 hours) at 11/26/2019 1015 Last data filed at 11/26/2019 9169 Gross per 24 hour  Intake 110 ml  Output 100 ml  Net 10 ml    LBM: Last  BM Date: 11/24/19 Baseline Weight: Weight: 57 kg Most recent weight: Weight: 57 kg     Palliative Assessment/Data: 30 %   Discussed with LCSW  PMT will f/u next week  Time In: 1000 Time Out: 1115 Time Total: 75 minutes Greater than 50%  of this time was spent counseling and coordinating care related to the above assessment and plan.  Signed by: Hannah Lessen, NP   Please contact Palliative Medicine Team phone at [phone] for questions and concerns.  For individual provider: See Shea Evans

## 2019-11-26 NOTE — Progress Notes (Signed)
[place] PHYSICAL MEDICINE & REHABILITATION PROGRESS NOTE   Subjective/Complaints: Up in bed. Husband at bedside. Anxious this morning. "I want to go home."  ROS: limited d/t behavior  Objective:   No results found. Recent Labs    11/24/19 0615  WBC 11.2*  HGB 14.7  HCT 43.5  PLT 391   Recent Labs    11/24/19 0615  NA 141  K 3.2*  CL 113*  CO2 19*  GLUCOSE 93  BUN 18  CREATININE 0.61  CALCIUM 9.5    Intake/Output Summary (Last 24 hours) at 11/26/2019 1018 Last data filed at 11/26/2019 0834 Gross per 24 hour  Intake 110 ml  Output 100 ml  Net 10 ml    Physical Exam: Vital Signs Blood pressure 119/85, pulse 69, temperature 98.3 F (36.8 C), resp. rate 18, height 5\' 7"  (1.702 m), weight 57 kg, SpO2 100 %.  Constitutional: No distress . Vital signs reviewed. HEENT: EOMI, oral membranes moist Neck: supple Cardiovascular: RRR without murmur. No JVD    Respiratory/Chest: CTA Bilaterally without wheezes or rales. Normal effort    GI/Abdomen: BS +, non-tender, non-distended Ext: no clubbing, cyanosis, or edema Psych: anxious, hyperventilating  Musc: No edema in extremities.  No tenderness in extremities. Neuro: alert, delayed. Remains dysphonic. Remains easily distracted by anxiety. Follows simple commands when directed.      Assessment/Plan: 1. Functional deficits secondary to acute, progressive dementia which require 3+ hours per day of interdisciplinary therapy in a comprehensive inpatient rehab setting.  Physiatrist is providing close team supervision and 24 hour management of active medical problems listed below.  Physiatrist and rehab team continue to assess barriers to discharge/monitor patient progress toward functional and medical goals  Care Tool:  Bathing    Body parts bathed by patient: Chest, Abdomen, Left arm, Right arm   Body parts bathed by helper: Front perineal area, Buttocks     Bathing assist Assist Level: Maximal Assistance -  Patient 24 - 49%     Upper Body Dressing/Undressing Upper body dressing   What is the patient wearing?: Pull over shirt    Upper body assist Assist Level: Maximal Assistance - Patient 25 - 49%    Lower Body Dressing/Undressing Lower body dressing      What is the patient wearing?: Underwear/pull up, Pants     Lower body assist Assist for lower body dressing: Total Assistance - Patient < 25%     Toileting Toileting    Toileting assist Assist for toileting: Total Assistance - Patient < 25%     Transfers Chair/bed transfer  Transfers assist     Chair/bed transfer assist level: Moderate Assistance - Patient 50 - 74% Chair/bed transfer assistive device:  (none)   Locomotion Ambulation   Ambulation assist   Ambulation activity did not occur: Safety/medical concerns (limited by anxiety and decreased strength/balance)  Assist level: 2 helpers Assistive device: Hand held assist Max distance: 176ft   Walk 10 feet activity   Assist  Walk 10 feet activity did not occur: Safety/medical concerns  Assist level: 2 helpers Assistive device: No Device, Hand held assist   Walk 50 feet activity   Assist Walk 50 feet with 2 turns activity did not occur: Safety/medical concerns (single turn/73ft)  Assist level: 2 helpers Assistive device: Hand held assist, No Device    Walk 150 feet activity   Assist Walk 150 feet activity did not occur: Safety/medical concerns         Walk 10 feet on uneven surface  activity   Assist Walk 10 feet on uneven surfaces activity did not occur: Safety/medical concerns         Wheelchair     Assist Will patient use wheelchair at discharge?: No (not anticipated per PT long term goals)   Wheelchair activity did not occur: Safety/medical concerns (limited by decreased cognition and anxiety)         Wheelchair 50 feet with 2 turns activity    Assist    Wheelchair 50 feet with 2 turns activity did not occur:  Safety/medical concerns       Wheelchair 150 feet activity     Assist  Wheelchair 150 feet activity did not occur: Safety/medical concerns   Assist Level: Maximal Assistance - Patient 25 - 49%   Blood pressure 119/85, pulse 69, temperature 98.3 F (36.8 C), resp. rate 18, height 5\' 7"  (1.702 m), weight 57 kg, SpO2 100 %.  Medical Problem List and Plan: 1.Progressive decrease in functional mobility, impaired cognition, apraxia with panic attacks generalized weakness and fallssecondary to acute metabolic encephalopathy/progressive dementiaof unknown etiology.  Symptoms most consistent with a functional neurological disorder. Continue supportive and restorative measures as we are doing.  -Continue Sinemet trial 25-100 mg 3 times daily Continue CIR PT, OT, speech   -had discussion with husband today regarding her diagnosis, LOS, goals of rehab, expectations moving forward. He seems to have a reasonable understanding of the multiple issues at hand. Wants to become involved in hands-on education with therapies to help prepare him for discharge home.   -palliative care visit today [facility] appreciated! 2. Antithrombotics: -DVT/anticoagulation:Lovenox -antiplatelet therapy: N/A 3. Pain Management:Tylenolas needed. Has headache at times- currently well controlled.  4. Mood:Zoloft 50 mg daily, Valium12.5mg  q12 scheduled -pt sees psychiatry as outpt -antipsychotic agents: N/A  -continue melatonin to assist with sleep  -sleep generally improved since admit  -husband prefers not to add further medication for anxiety  -ongoing neuropsych support appreciated---will need more counseling as outp 5. Neuropsych: This patientis notcapable of making decisions on herown behalf-history of dementia. 6. Skin/Wound Care:Routine skin checks 7. Fluids/Electrolytes/Nutrition:encourage po    -Megace  800mg  bid per home  regimen    -Marinol--need to be observant for cognitive side effects  -eating 50-80% most meals  -recent prealbumin was 30 8. Hyper thyroidism. Held Tapazole 5 mg 3 times daily per #9. continue Inderal 60 mg daily.   -pt will see endocrine as outpt  9. Elevated LFT's-  LFTs trending downward on 10/12 and again 10/19.  -tapazole, DC'd      LOS: 17 days A FACE TO [place] 11/26/2019, 10:18 AM

## 2019-11-26 NOTE — Progress Notes (Signed)
Occupational Therapy Session Note  Patient Details  Name: Hannah Padilla MRN: [id number] Date of Birth: 1950/10/13  Today's Date: 11/26/2019 OT Individual Time: 1003-1059 OT Individual Time Calculation (min): 56 min    Short Term Goals: Week 1:  OT Short Term Goal 1 (Week 1): Pt will complete UB dressing with mod assist. OT Short Term Goal 1 - Progress (Week 1): Not met OT Short Term Goal 2 (Week 1): Pt will complete sit <>stand with mod assist in preperation for LB self care. OT Short Term Goal 2 - Progress (Week 1): Not met OT Short Term Goal 3 (Week 1): Pt will complete oral hygiene sitting sinkside with min assist. OT Short Term Goal 3 - Progress (Week 1): Met OT Short Term Goal 4 (Week 1): Pt will bathe UB with mod assist in seated position. OT Short Term Goal 4 - Progress (Week 1): Met  Skilled Therapeutic Interventions/Progress Updates:    1:1.  Pt received in bed asleep. Audibly snoring for first half of session with husband present discussing bathing options, shower set up and safety concerns. Husband and OT discuss stair negotiation, TTB use and grab bar placement for shower upstairs. OT edu re not going up stairs until/IF PT ever deems safe for both pt and husband therefore sponge bathing at home is optimal. Discussed bathing over [facility] for easy access to wash buttocks. Husband verbalized understnading. Pt aroused and agreeable to walking to/from bathroom with MOD A into bathroom d/t post lean and uphill threshold negotiation and MIN A out of bathroom with pt more upright and center. Total A to don pants with pt abl eto pull from ankles to hips in sittng with HOH A for forward reach. Exited session with pt seated in bed, exit alarm on and call light in reach  Therapy Documentation Precautions:  Precautions Precautions: Fall Precaution Comments: anxious with mobility-resists posteriorly; tremors bilateral hands; watch skin integrity bilateral palms due to clenching fists  intermittently Restrictions Weight Bearing Restrictions: No General:   Vital Signs:   Pain: Pain Assessment Pain Scale: Faces Faces Pain Scale: No hurt ADL: ADL Grooming: Dependent Where Assessed-Grooming: Sitting at sink Toileting: Dependent Where Assessed-Toileting: Bed level Vision   Perception    Praxis   Exercises:   Other Treatments:     Therapy/Group: Individual Therapy  [name] 11/26/2019, 10:59 AM

## 2019-11-27 ENCOUNTER — Inpatient Hospital Stay (HOSPITAL_COMMUNITY): Payer: Medicare Other | Admitting: Occupational Therapy

## 2019-11-27 ENCOUNTER — Inpatient Hospital Stay (HOSPITAL_COMMUNITY): Payer: Medicare Other

## 2019-11-27 ENCOUNTER — Inpatient Hospital Stay (HOSPITAL_COMMUNITY): Payer: Medicare Other | Admitting: Speech Pathology

## 2019-11-27 DIAGNOSIS — Z515 Encounter for palliative care: Secondary | ICD-10-CM

## 2019-11-27 NOTE — Progress Notes (Signed)
[place] PHYSICAL MEDICINE & REHABILITATION PROGRESS NOTE   Subjective/Complaints: Pt sitting up in bed. Extremely anxious again. Slept fairly well last night. Husband wasn't in yet when I visited today  ROS: Limited due to cognitive/behavioral    Objective:   No results found. No results for input(s): WBC, HGB, HCT, PLT in the last 72 hours. No results for input(s): NA, K, CL, CO2, GLUCOSE, BUN, CREATININE, CALCIUM in the last 72 hours.  Intake/Output Summary (Last 24 hours) at 11/27/2019 1152 Last data filed at 11/26/2019 1757 Gross per 24 hour  Intake 20 ml  Output --  Net 20 ml    Physical Exam: Vital Signs Blood pressure 122/74, pulse 66, temperature 98.3 F (36.8 C), resp. rate 20, height 5\' 7"  (1.702 m), weight 57 kg, SpO2 100 %.  Constitutional: No distress . Vital signs reviewed. HEENT: EOMI, oral membranes moist Neck: supple Cardiovascular: RRR without murmur. No JVD    Respiratory/Chest: CTA Bilaterally without wheezes or rales. Normal effort    GI/Abdomen: BS +, non-tender, non-distended Ext: no clubbing, cyanosis, or edema Psych: anxious and tearful  Musc: No edema in extremities.  No tenderness in extremities. Neuro: alert, delayed. Speaks in whisper even when cued. Remains easily distracted by anxiety. Follows simple commands when directed.      Assessment/Plan: 1. Functional deficits secondary to acute, progressive dementia which require 3+ hours per day of interdisciplinary therapy in a comprehensive inpatient rehab setting.  Physiatrist is providing close team supervision and 24 hour management of active medical problems listed below.  Physiatrist and rehab team continue to assess barriers to discharge/monitor patient progress toward functional and medical goals  Care Tool:  Bathing    Body parts bathed by patient: Chest, Abdomen, Left arm, Right arm   Body parts bathed by helper: Front perineal area, Buttocks     Bathing assist Assist  Level: Maximal Assistance - Patient 24 - 49%     Upper Body Dressing/Undressing Upper body dressing   What is the patient wearing?: Pull over shirt    Upper body assist Assist Level: Maximal Assistance - Patient 25 - 49%    Lower Body Dressing/Undressing Lower body dressing      What is the patient wearing?: Underwear/pull up, Pants     Lower body assist Assist for lower body dressing: Total Assistance - Patient < 25%     Toileting Toileting    Toileting assist Assist for toileting: Total Assistance - Patient < 25%     Transfers Chair/bed transfer  Transfers assist     Chair/bed transfer assist level: Moderate Assistance - Patient 50 - 74% Chair/bed transfer assistive device:  (none)   Locomotion Ambulation   Ambulation assist   Ambulation activity did not occur: Safety/medical concerns (limited by anxiety and decreased strength/balance)  Assist level: Moderate Assistance - Patient 50 - 74% Assistive device: Walker-rolling Max distance: 110   Walk 10 feet activity   Assist  Walk 10 feet activity did not occur: Safety/medical concerns  Assist level: Moderate Assistance - Patient - 50 - 74% Assistive device: Walker-rolling   Walk 50 feet activity   Assist Walk 50 feet with 2 turns activity did not occur: Safety/medical concerns (single turn/76ft)  Assist level: Moderate Assistance - Patient - 50 - 74% Assistive device: Walker-rolling    Walk 150 feet activity   Assist Walk 150 feet activity did not occur: Safety/medical concerns         Walk 10 feet on uneven surface  activity  Assist Walk 10 feet on uneven surfaces activity did not occur: Safety/medical concerns         Wheelchair     Assist Will patient use wheelchair at discharge?: No (not anticipated per PT long term goals)   Wheelchair activity did not occur: Safety/medical concerns (limited by decreased cognition and anxiety)         Wheelchair 50 feet with 2 turns  activity    Assist    Wheelchair 50 feet with 2 turns activity did not occur: Safety/medical concerns       Wheelchair 150 feet activity     Assist  Wheelchair 150 feet activity did not occur: Safety/medical concerns   Assist Level: Maximal Assistance - Patient 25 - 49%   Blood pressure 122/74, pulse 66, temperature 98.3 F (36.8 C), resp. rate 20, height 5\' 7"  (1.702 m), weight 57 kg, SpO2 100 %.  Medical Problem List and Plan: 1.Progressive decrease in functional mobility, impaired cognition, apraxia with panic attacks generalized weakness and fallssecondary to acute metabolic encephalopathy/progressive dementiaof unknown etiology.  Symptoms most consistent with a functional neurological disorder. Continue supportive and restorative measures as we are doing.  -Continue Sinemet trial 25-100 mg 3 times daily Continue CIR PT, OT, speech   -palliative care visit today MUCH appreciated!  -family ed for husband 2. Antithrombotics: -DVT/anticoagulation:Lovenox -antiplatelet therapy: N/A 3. Pain Management:Tylenolas needed. Has headache at times- currently well controlled.  4. Mood:Zoloft 50 mg daily, Valium12.5mg  q12 scheduled -pt sees psychiatry as outpt -antipsychotic agents: N/A  -continue melatonin to assist with sleep  -sleep generally improved since admit  -husband prefers not to add further medication for anxiety  -will need ongoing psych support at discharge 5. Neuropsych: This patientis notcapable of making decisions on herown behalf-given neuro-behavioral issues. 6. Skin/Wound Care:Routine skin checks 7. Fluids/Electrolytes/Nutrition:encourage po    -Megace  800mg  bid per home regimen    -Marinol--need to be observant for cognitive side effects  -ate less over last 24+ hours  -recent prealbumin was 30 however  -needs reassurance, cueing with meals 8. Hyper thyroidism. Held  Tapazole 5 mg 3 times daily per #9. continue Inderal 60 mg daily.   -pt will see endocrine as outpt  9. Elevated LFT's-  LFTs trending downward on 10/12 and again 10/19.  -tapazole, DC'd   -recheck labs next week     LOS: 18 days A FACE TO [place] 11/27/2019, 11:52 AM

## 2019-11-27 NOTE — Plan of Care (Signed)
  Problem: RH Swallowing Goal: LTG Patient will consume least restrictive diet using compensatory strategies with assistance (SLP) Description: LTG:  Patient will consume least restrictive diet using compensatory strategies with assistance (SLP) Flowsheets (Taken 11/27/2019 1219) LTG: Pt Patient will consume least restrictive diet using compensatory strategies with assistance of (SLP): Moderate Assistance - Patient 50 - 74% Note: Downgraded due to lack of progress  Goal: LTG Patient will participate in dysphagia therapy to increase swallow function with assistance (SLP) Description: LTG:  Patient will participate in dysphagia therapy to increase swallow function with assistance (SLP) Flowsheets (Taken 11/27/2019 1219) LTG: Pt will participate in dysphagia therapy to increase swallow function with assistance of (SLP): Moderate Assistance - Patient 50 - 74% Note: Downgraded due to lack of progress    Problem: RH Comprehension Communication Goal: LTG Patient will comprehend basic/complex auditory (SLP) Description: LTG: Patient will comprehend basic/complex auditory information with cues (SLP). Flowsheets (Taken 11/27/2019 1219) LTG: Patient will comprehend auditory information with cueing (SLP): Moderate Assistance - Patient 50 - 74% Note: Downgraded due to lack of progress    Problem: RH Expression Communication Goal: LTG Patient will verbally express basic/complex needs(SLP) Description: LTG:  Patient will verbally express basic/complex needs, wants or ideas with cues  (SLP) Flowsheets (Taken 11/27/2019 1219) LTG: Patient will verbally express basic/complex needs, wants or ideas (SLP): Moderate Assistance - Patient 50 - 74% Note: Downgraded due to lack of progress    Problem: RH Problem Solving Goal: LTG Patient will demonstrate problem solving for (SLP) Description: LTG:  Patient will demonstrate problem solving for basic/complex daily situations with cues  (SLP) Flowsheets (Taken  11/27/2019 1219) LTG Patient will demonstrate problem solving for: Moderate Assistance - Patient 50 - 74% Note: Downgraded due to lack of progress    Problem: RH Attention Goal: LTG Patient will demonstrate this level of attention during functional activites (SLP) Description: LTG:  Patient will will demonstrate this level of attention during functional activites (SLP) Flowsheets (Taken 11/27/2019 1219) LTG: Patient will demonstrate this level of attention during cognitive/linguistic activities with assistance of (SLP): Moderate Assistance - Patient 50 - 74% Note: Downgraded due to lack of progress    Problem: RH Expression Communication Goal: LTG Patient will increase speech intelligibility (SLP) Description: LTG: Patient will increase speech intelligibility at word/phrase/conversation level with cues, % of the time (SLP) Flowsheets (Taken 11/27/2019 1219) Percent of time patient will use intelligible speech: 50% Note: Downgraded due to lack of progress

## 2019-11-27 NOTE — Progress Notes (Signed)
Patient ID: Hannah Padilla, female   DOB: 1950/05/08, 69 y.o.   MRN: [id number]  SW left message for pt husband to follow-up to discuss HHA preference. SW waiting on follow-up.  Loralee Pacas, MSW, [place] Office: [phone] Cell: [phone] Fax: [phone]

## 2019-11-27 NOTE — Progress Notes (Signed)
Speech Language Pathology Daily Session Note  Patient Details  Name: Hannah Padilla MRN: [id number] Date of Birth: 08-23-1950  Today's Date: 11/27/2019 SLP Individual Time: 0730-0825 SLP Individual Time Calculation (min): 55 min  Short Term Goals: Week 3: SLP Short Term Goal 1 (Week 3): Patient will demonstrate sustained attention in 5 minute intervals with max A verbal cues. SLP Short Term Goal 2 (Week 3): Patient will demonstrate orientation to place and time with Mod A multimodal cues. SLP Short Term Goal 3 (Week 3): Pt will demonstrate basic problem solving skills by using call bell to request assistance with max A multimodal cues. SLP Short Term Goal 4 (Week 3): Pt will consume dys 3 textures and thin liquid diet with minimal overt s/s aspiration and min A verbal cues for use of swallow strategies. SLP Short Term Goal 5 (Week 3): Pt will increase vocal intensity to 75% intelligibility at the sentence level with max A verbal cues.  Skilled Therapeutic Interventions: Skilled treatment session focused on cognitive goals. Upon arrival, patient was awake in bed and appeared extremely anxious. Patient required more than a reasonable amount of time and overall hand over hand assist to initiate drinking or self-feeding with breakfast meal due to patient consistently reporting, "I can't" or "I'm scared." Patient with increased language of confusion this session and required overall Max A multimodal cues to utilize speech intelligibility strategies to achieve ~25% intelligibility. Max A verbal cues were also required for focused attention to functional tasks. Patient left upright in bed with alarm on, RN present and all needs within reach. Continue with current plan of care.      Pain No/Denies Pain   Therapy/Group: Individual Therapy  Veronica Fretz 11/27/2019, 12:24 PM

## 2019-11-27 NOTE — Progress Notes (Signed)
Occupational Therapy Session Note  Patient Details  Name: Hannah Padilla MRN: [id number] Date of Birth: 1950-03-29  Today's Date: 11/27/2019 OT Individual Time: 1440-1449 OT Individual Time Calculation (min): 9 min  51 minutes missed  Skilled Therapeutic Interventions/Progress Updates:    Pt greeted in bed, visibly anxious upon arrival. Attempted to establish rapport with pt via light conversation using calm demeanor. Pt initially responding appropriately with quiet voice but then started repeating "go away" and "no," agitation visibly increasing. OT left for a period of time and then returned to see if agitation lessened. Tried to establish rapport once again with pt repeating "go away" and "no." OT left to decrease agitation, RN aware. Time missed due to pt refusal to participate.    Therapy Documentation Precautions:  Precautions Precautions: Fall Precaution Comments: anxious with mobility-resists posteriorly; tremors bilateral hands; watch skin integrity bilateral palms due to clenching fists intermittently Restrictions Weight Bearing Restrictions: No \\Vital  Signs: Therapy Vitals Temp: 98.4 F (36.9 C) Pulse Rate: 62 Resp: 16 BP: 101/67 Patient Position (if appropriate): Lying Oxygen Therapy SpO2: 99 % O2 Device: Room Air :   ADL: ADL Grooming: Dependent Where Assessed-Grooming: Sitting at sink Toileting: Dependent Where Assessed-Toileting: Bed level      Therapy/Group: Individual Therapy  Artrice Kraker A Luisangel Wainright 11/27/2019, 3:36 PM

## 2019-11-27 NOTE — Progress Notes (Signed)
Physical Therapy Session Note  Patient Details  Name: Hannah Padilla MRN: [id number] Date of Birth: 11-27-1950  Today's Date: 11/27/2019 PT Missed Time: 61 Minutes Missed Time Reason: Patient unwilling to participate  Short Term Goals: Week 3:  PT Short Term Goal 1 (Week 3): Patient will perform basic transfer with mod A using LRAD consistently. PT Short Term Goal 2 (Week 3): Patient will ambulate with 50 feet with mod A demonstrating improved foot clearance to reduce fall risk. PT Short Term Goal 3 (Week 3): Patient's husband will initiate hands on training with all mobility.  Pt supine in bed with eyes closed. PT attempts multiple times to rouse pt and pt will only open eyes briefly. Pt verbalizes that she does not want to awaken and would like to rest. PT to follow up as appropriate.  Therapy/Group: Individual Therapy  Breck Coons 11/27/2019, 7:59 AM

## 2019-11-28 DIAGNOSIS — R1310 Dysphagia, unspecified: Secondary | ICD-10-CM

## 2019-11-28 DIAGNOSIS — E876 Hypokalemia: Secondary | ICD-10-CM

## 2019-11-28 DIAGNOSIS — D72829 Elevated white blood cell count, unspecified: Secondary | ICD-10-CM

## 2019-11-28 NOTE — Plan of Care (Signed)
  Problem: Consults Goal: RH GENERAL PATIENT EDUCATION Description: See Patient Education module for education specifics. Outcome: Progressing Goal: Skin Care Protocol Initiated - if Braden Score 18 or less Description: If consults are not indicated, leave blank or document N/A Outcome: Progressing Goal: Nutrition Consult-if indicated Outcome: Progressing   Problem: RH BOWEL ELIMINATION Goal: RH STG MANAGE BOWEL WITH ASSISTANCE Description: STG Manage Bowel with min Assistance. Outcome: Progressing Goal: RH STG MANAGE BOWEL W/MEDICATION W/ASSISTANCE Description: STG Manage Bowel with Medication with min Assistance. Outcome: Progressing   Problem: RH BLADDER ELIMINATION Goal: RH STG MANAGE BLADDER WITH ASSISTANCE Description: STG Manage Bladder With min Assistance Outcome: Progressing   Problem: RH SKIN INTEGRITY Goal: RH STG SKIN FREE OF INFECTION/BREAKDOWN Description: No new skin breakdown this admission with min assist  Outcome: Progressing Goal: RH STG MAINTAIN SKIN INTEGRITY WITH ASSISTANCE Description: STG Maintain Skin Integrity With min Assistance. Outcome: Progressing   Problem: RH SAFETY Goal: RH STG ADHERE TO SAFETY PRECAUTIONS W/ASSISTANCE/DEVICE Description: STG Adhere to Safety Precautions With min Assistance/Device. Outcome: Progressing Goal: RH STG DECREASED RISK OF FALL WITH ASSISTANCE Description: STG Decreased Risk of Fall With min Assistance. Outcome: Progressing   Problem: RH PAIN MANAGEMENT Goal: RH STG PAIN MANAGED AT OR BELOW PT'S PAIN GOAL Description: Less than 3 out of 10 Outcome: Progressing   Problem: RH KNOWLEDGE DEFICIT GENERAL Goal: RH STG INCREASE KNOWLEDGE OF SELF CARE AFTER HOSPITALIZATION Description: Pt will be able to adhere to medication regimen with min assist from family.  Outcome: Progressing

## 2019-11-28 NOTE — Progress Notes (Signed)
[place] PHYSICAL MEDICINE & REHABILITATION PROGRESS NOTE   Subjective/Complaints: Patient seen laying in bed this morning.  She states she slept well overnight.  No reported issues overnight.  She states her stomach hurts, but immediately falls back to sleep.  ROS: Limited due to cognition/behavior   Objective:   No results found. No results for input(s): WBC, HGB, HCT, PLT in the last 72 hours. No results for input(s): NA, K, CL, CO2, GLUCOSE, BUN, CREATININE, CALCIUM in the last 72 hours.  Intake/Output Summary (Last 24 hours) at 11/28/2019 0906 Last data filed at 11/28/2019 0400 Gross per 24 hour  Intake 190 ml  Output 500 ml  Net -310 ml    Physical Exam: Vital Signs Blood pressure 118/70, pulse 71, temperature 98.1 F (36.7 C), resp. rate 16, height 5\' 7"  (1.702 m), weight 57 kg, SpO2 100 %. Constitutional: No distress . Vital signs reviewed. HENT: Normocephalic.  Atraumatic. Eyes: EOMI. No discharge. Cardiovascular: No JVD.  RRR. Respiratory: Normal effort.  No stridor.  Bilateral clear to auscultation. GI: Non-distended.  BS +. Skin: Warm and dry.  Intact. Psych: Flat.  Anxious. Musc: No edema in extremities.  No tenderness in extremities. Neuro: Alert Dysphonia Motor exam limited due at least antigravity strength throughout   Assessment/Plan: 1. Functional deficits secondary to acute, progressive dementia which require 3+ hours per day of interdisciplinary therapy in a comprehensive inpatient rehab setting.  Physiatrist is providing close team supervision and 24 hour management of active medical problems listed below.  Physiatrist and rehab team continue to assess barriers to discharge/monitor patient progress toward functional and medical goals  Care Tool:  Bathing    Body parts bathed by patient: Chest, Abdomen, Left arm, Right arm   Body parts bathed by helper: Front perineal area, Buttocks     Bathing assist Assist Level: Maximal Assistance -  Patient 24 - 49%     Upper Body Dressing/Undressing Upper body dressing   What is the patient wearing?: Pull over shirt    Upper body assist Assist Level: Maximal Assistance - Patient 25 - 49%    Lower Body Dressing/Undressing Lower body dressing      What is the patient wearing?: Underwear/pull up, Pants     Lower body assist Assist for lower body dressing: Total Assistance - Patient < 25%     Toileting Toileting    Toileting assist Assist for toileting: Total Assistance - Patient < 25%     Transfers Chair/bed transfer  Transfers assist     Chair/bed transfer assist level: Moderate Assistance - Patient 50 - 74% Chair/bed transfer assistive device:  (none)   Locomotion Ambulation   Ambulation assist   Ambulation activity did not occur: Safety/medical concerns (limited by anxiety and decreased strength/balance)  Assist level: Moderate Assistance - Patient 50 - 74% Assistive device: Walker-rolling Max distance: 110   Walk 10 feet activity   Assist  Walk 10 feet activity did not occur: Safety/medical concerns  Assist level: Moderate Assistance - Patient - 50 - 74% Assistive device: Walker-rolling   Walk 50 feet activity   Assist Walk 50 feet with 2 turns activity did not occur: Safety/medical concerns (single turn/61ft)  Assist level: Moderate Assistance - Patient - 50 - 74% Assistive device: Walker-rolling    Walk 150 feet activity   Assist Walk 150 feet activity did not occur: Safety/medical concerns         Walk 10 feet on uneven surface  activity   Assist Walk 10 feet on  uneven surfaces activity did not occur: Safety/medical concerns         Wheelchair     Assist Will patient use wheelchair at discharge?: No (not anticipated per PT long term goals)   Wheelchair activity did not occur: Safety/medical concerns (limited by decreased cognition and anxiety)         Wheelchair 50 feet with 2 turns activity    Assist     Wheelchair 50 feet with 2 turns activity did not occur: Safety/medical concerns       Wheelchair 150 feet activity     Assist  Wheelchair 150 feet activity did not occur: Safety/medical concerns   Assist Level: Maximal Assistance - Patient 25 - 49%   Blood pressure 118/70, pulse 71, temperature 98.1 F (36.7 C), resp. rate 16, height 5\' 7"  (1.702 m), weight 57 kg, SpO2 100 %.  Medical Problem List and Plan: 1.Progressive decrease in functional mobility, impaired cognition, apraxia with panic attacks generalized weakness and fallssecondary to acute metabolic encephalopathy/progressive dementiaof unknown etiology.  Symptoms most consistent with a functional neurological disorder. Continue supportive and restorative measures as we are doing.  -Continue Sinemet trial 25-100 mg 3 times daily Continue CIR 2. Antithrombotics: -DVT/anticoagulation:Lovenox -antiplatelet therapy: N/A 3. Pain Management:Tylenolas needed. Has headache at times  Controlled on 10/23 4. Mood:Zoloft 50 mg daily, Valium12.5mg  q12 scheduled -pt sees psychiatry as outpt -antipsychotic agents: N/A  -continue melatonin to assist with sleep  -husband prefers not to add further medication for anxiety  -will need ongoing psych support at discharge 5. Neuropsych: This patientis notcapable of making decisions on herown behalf-given neuro-behavioral issues. 6. Skin/Wound Care:Routine skin checks 7. Fluids/Electrolytes/Nutrition:encourage po    -Megace  800mg  bid per home regimen    -Marinol--need to be observant for cognitive side effects  -needs reassurance, cueing with meals 8. Hyper thyroidism. Held Tapazole 5 mg 3 times daily per #9. continue Inderal 60 mg daily.   -pt will see endocrine as outpt  9. Elevated LFT's-  LFTs trending downward on 10/12 and again 10/19.  -tapazole, DC'd  10.  Hypokalemia  Potassium 3.2 on  10/19, labs ordered for Monday  Supplement potassium  Continue to monitor 11.  Leukocytosis  WBCs 11.2 on 10/19, labs ordered for Monday  Afebrile 12.  Dysphagia  D3 thins with full supervision, advance diet as tolerated   LOS: 19 days A FACE TO FACE EVALUATION WAS PERFORMED  Praise Dolecki Lorie Phenix 11/28/2019, 9:06 AM

## 2019-11-29 ENCOUNTER — Inpatient Hospital Stay (HOSPITAL_COMMUNITY): Payer: Medicare Other | Admitting: Speech Pathology

## 2019-11-29 ENCOUNTER — Inpatient Hospital Stay (HOSPITAL_COMMUNITY): Payer: Medicare Other

## 2019-11-29 NOTE — Plan of Care (Signed)
  Problem: Consults Goal: RH GENERAL PATIENT EDUCATION Description: See Patient Education module for education specifics. Outcome: Progressing Goal: Skin Care Protocol Initiated - if Braden Score 18 or less Description: If consults are not indicated, leave blank or document N/A Outcome: Progressing Goal: Nutrition Consult-if indicated Outcome: Progressing   Problem: RH BOWEL ELIMINATION Goal: RH STG MANAGE BOWEL WITH ASSISTANCE Description: STG Manage Bowel with min Assistance. Outcome: Progressing Goal: RH STG MANAGE BOWEL W/MEDICATION W/ASSISTANCE Description: STG Manage Bowel with Medication with min Assistance. Outcome: Progressing   Problem: RH BLADDER ELIMINATION Goal: RH STG MANAGE BLADDER WITH ASSISTANCE Description: STG Manage Bladder With min Assistance Outcome: Progressing   Problem: RH SKIN INTEGRITY Goal: RH STG SKIN FREE OF INFECTION/BREAKDOWN Description: No new skin breakdown this admission with min assist  Outcome: Progressing Goal: RH STG MAINTAIN SKIN INTEGRITY WITH ASSISTANCE Description: STG Maintain Skin Integrity With min Assistance. Outcome: Progressing   Problem: RH SAFETY Goal: RH STG ADHERE TO SAFETY PRECAUTIONS W/ASSISTANCE/DEVICE Description: STG Adhere to Safety Precautions With min Assistance/Device. Outcome: Progressing Goal: RH STG DECREASED RISK OF FALL WITH ASSISTANCE Description: STG Decreased Risk of Fall With min Assistance. Outcome: Progressing   Problem: RH PAIN MANAGEMENT Goal: RH STG PAIN MANAGED AT OR BELOW PT'S PAIN GOAL Description: Less than 3 out of 10 Outcome: Progressing   Problem: RH KNOWLEDGE DEFICIT GENERAL Goal: RH STG INCREASE KNOWLEDGE OF SELF CARE AFTER HOSPITALIZATION Description: Pt will be able to adhere to medication regimen with min assist from family.  Outcome: Progressing

## 2019-11-29 NOTE — Progress Notes (Signed)
Physical Therapy Session Note  Patient Details  Name: Hannah Padilla MRN: [id number] Date of Birth: 1950/02/19  Today's Date: 11/29/2019 PT Individual Time: 0905-1015 PT Individual Time Calculation (min): 70 min   Short Term Goals: Week 3:  PT Short Term Goal 1 (Week 3): Patient will perform basic transfer with mod A using LRAD consistently. PT Short Term Goal 2 (Week 3): Patient will ambulate with 50 feet with mod A demonstrating improved foot clearance to reduce fall risk. PT Short Term Goal 3 (Week 3): Patient's husband will initiate hands on training with all mobility.  Skilled Therapeutic Interventions/Progress Updates:     Patient in bed upon PT arrival. Patient alert and agreeable to PT session. Patient denied pain during session. Reported that she needed to have a BM at beginning of session.  Therapeutic Activity: Bed Mobility: Patient performed supine to/from sit with min A with use of hospital bed functions. Provided verbal cues and hand over hand assist for hand placement and sliding LEs off the bed. Transfers: Patient performed sit to/from stand x4 with min a using a RW with PT bracing RW to allow patient to pull up on AD for improved forward weight shift. She performed stand pivot to/from [facility] with min A using grab bar and sit to/from stand using Stedy x1 for transfer from bathroom to bed for time management and energy conservation at end of session. Provided verbal cues for forward weight shift, hand placement, erect posture with hip extension in standing, and sequencing for turns during transfers. Patient was continent of bowel x3 during session, RN made aware. Required total A for peri-care and LB dressing during toileting.  Patient performed standing balance with CGA-close supervision during peri-care and LB dressing using RW for support.  Gait Training:  Patient ambulated 15 feet to/from the bathroom x2 and 120 feet using RW with min A for AD management and CGA-min A for  balance. Ambulated with increased hip/knee flexion, posterior hip placement, shuffling gait, and narrow BOS. Facilitated forward propulsion with use of RW and provided verbal and tactile cues for hip extension, increased step length, and increased BOS. Patient required encouragement for longer gait trial and became more motivated with told she was walking back to her room.   Patient in bed at end of session with breaks locked, bed alarm set, and all needs within reach. Encouraged the patient to sit OOB at end of session, however, patient was insistent on lying in bed. Will continue to promote improving OOB tolerance with nursing staff and throughout the week.    Therapy Documentation Precautions:  Precautions Precautions: Fall Precaution Comments: anxious with mobility-resists posteriorly; tremors bilateral hands; watch skin integrity bilateral palms due to clenching fists intermittently Restrictions Weight Bearing Restrictions: No   Therapy/Group: Individual Therapy  Zohal Reny L Christopher Hink PT, DPT  11/29/2019, 4:46 PM

## 2019-11-29 NOTE — Progress Notes (Signed)
Speech Language Pathology Daily Session Note  Patient Details  Name: Hannah Padilla MRN: [id number] Date of Birth: [birth year]  Today's Date: 11/29/2019 SLP Individual Time: 9798-9211 SLP Individual Time Calculation (min): 56 min  Short Term Goals: Week 3: SLP Short Term Goal 1 (Week 3): Patient will demonstrate sustained attention in 5 minute intervals with max A verbal cues. SLP Short Term Goal 2 (Week 3): Patient will demonstrate orientation to place and time with Mod A multimodal cues. SLP Short Term Goal 3 (Week 3): Pt will demonstrate basic problem solving skills by using call bell to request assistance with max A multimodal cues. SLP Short Term Goal 4 (Week 3): Pt will consume dys 3 textures and thin liquid diet with minimal overt s/s aspiration and min A verbal cues for use of swallow strategies. SLP Short Term Goal 5 (Week 3): Pt will increase vocal intensity to 75% intelligibility at the sentence level with max A verbal cues.   Skilled Therapeutic Interventions: Pt was seen for skilled ST targeting cognitive linguistic goals. She required a significant amount of extra time and Max A verbal cues for lip seal and other means of accurate use of EMST device set to 13 cm H2O. She eventually completed 10 reps with device set to that level, with breaks in between and cues for following basic directions. She responded to cues for increased vocal intensity in ~30% of opportunities, although intelligibility remains very diminished at phrase and sentence level. She required Max A multimodal cueing for initiation of 1 sip of her drink. When SLP attempted to provide very minimal physical to  assist initiating raising her cup, pt became somewhat verbally agitated and used expletives with SLP, although easily reached a calmer state with a soft verbal cue about rationale of activity and encouragement for participation. Moderate verbal and visual cues provided to use external aid in room to orient to place, and  month. Pt verbalized purpose of soft call bell is to request assistance, however Max A verbal cues required for her to identify a situation in which she may need to use it. Pt left laying in bed with alarm set and needs within reach, NT present. Continue per current plan of care.          Pain Pain Assessment Pain Scale: Faces Faces Pain Scale: No hurt  Therapy/Group: Individual Therapy  Arbutus Leas 11/29/2019, 2:58 PM

## 2019-11-29 NOTE — Progress Notes (Signed)
Hannah Padilla PHYSICAL MEDICINE & REHABILITATION PROGRESS NOTE   Subjective/Complaints: Patient seen laying in bed this morning.  She states she slept well overnight, confirmed with sleep chart.  She whispers "brown box" and then is upset when informed that there is no brown box in her room.  ROS: Limited due to cognition/behavior  Objective:   No results found. No results for input(s): WBC, HGB, HCT, PLT in the last 72 hours. No results for input(s): NA, K, CL, CO2, GLUCOSE, BUN, CREATININE, CALCIUM in the last 72 hours.  Intake/Output Summary (Last 24 hours) at 11/29/2019 0832 Last data filed at 11/29/2019 0651 Gross per 24 hour  Intake 400 ml  Output --  Net 400 ml    Physical Exam: Vital Signs Blood pressure (!) 123/91, pulse (!) 104, temperature 98 F (36.7 C), resp. rate 16, height 5\' 7"  (1.702 m), weight 57 kg, SpO2 95 %. Constitutional: No distress . Vital signs reviewed. HENT: Normocephalic.  Atraumatic. Eyes: EOMI. No discharge. Cardiovascular: No JVD.  RRR. Respiratory: Normal effort.  No stridor.  Bilateral clear to auscultation. GI: Non-distended.  BS +. Skin: Warm and dry.  Intact. Psych: Very anxious. Musc: No edema in extremities.  No tenderness in extremities. Neuro: Alert Dysphonia, unchanged Motor exam limited due at least antigravity strength throughout, unchanged  Assessment/Plan: 1. Functional deficits secondary to acute, progressive dementia which require 3+ hours per day of interdisciplinary therapy in a comprehensive inpatient rehab setting.  Physiatrist is providing close team supervision and 24 hour management of active medical problems listed below.  Physiatrist and rehab team continue to assess barriers to discharge/monitor patient progress toward functional and medical goals  Care Tool:  Bathing    Body parts bathed by patient: Chest, Abdomen, Left arm, Right arm   Body parts bathed by helper: Front perineal area, Buttocks     Bathing  assist Assist Level: Maximal Assistance - Patient 24 - 49%     Upper Body Dressing/Undressing Upper body dressing   What is the patient wearing?: Pull over shirt    Upper body assist Assist Level: Maximal Assistance - Patient 25 - 49%    Lower Body Dressing/Undressing Lower body dressing      What is the patient wearing?: Underwear/pull up, Pants     Lower body assist Assist for lower body dressing: Total Assistance - Patient < 25%     Toileting Toileting    Toileting assist Assist for toileting: Total Assistance - Patient < 25%     Transfers Chair/bed transfer  Transfers assist     Chair/bed transfer assist level: Moderate Assistance - Patient 50 - 74% Chair/bed transfer assistive device:  (none)   Locomotion Ambulation   Ambulation assist   Ambulation activity did not occur: Safety/medical concerns (limited by anxiety and decreased strength/balance)  Assist level: Moderate Assistance - Patient 50 - 74% Assistive device: Walker-rolling Max distance: 110   Walk 10 feet activity   Assist  Walk 10 feet activity did not occur: Safety/medical concerns  Assist level: Moderate Assistance - Patient - 50 - 74% Assistive device: Walker-rolling   Walk 50 feet activity   Assist Walk 50 feet with 2 turns activity did not occur: Safety/medical concerns (single turn/46ft)  Assist level: Moderate Assistance - Patient - 50 - 74% Assistive device: Walker-rolling    Walk 150 feet activity   Assist Walk 150 feet activity did not occur: Safety/medical concerns         Walk 10 feet on uneven surface  activity  Assist Walk 10 feet on uneven surfaces activity did not occur: Safety/medical concerns         Wheelchair     Assist Will patient use wheelchair at discharge?: No (not anticipated per PT long term goals)   Wheelchair activity did not occur: Safety/medical concerns (limited by decreased cognition and anxiety)         Wheelchair 50 feet  with 2 turns activity    Assist    Wheelchair 50 feet with 2 turns activity did not occur: Safety/medical concerns       Wheelchair 150 feet activity     Assist  Wheelchair 150 feet activity did not occur: Safety/medical concerns   Assist Level: Maximal Assistance - Patient 25 - 49%   Blood pressure (!) 123/91, pulse (!) 104, temperature 98 F (36.7 C), resp. rate 16, height 5\' 7"  (1.702 m), weight 57 kg, SpO2 95 %.  Medical Problem List and Plan: 1.Progressive decrease in functional mobility, impaired cognition, apraxia with panic attacks generalized weakness and fallssecondary to acute metabolic encephalopathy/progressive dementiaof unknown etiology.  Symptoms most consistent with a functional neurological disorder. Continue supportive and restorative measures as we are doing.  -Continue Sinemet trial 25-100 mg 3 times daily  Continue CIR 2. Antithrombotics: -DVT/anticoagulation:Lovenox -antiplatelet therapy: N/A 3. Pain Management:Tylenolas needed. Has headache at times  Controlled on 10/24 4. Mood:Zoloft 50 mg daily, Valium12.5mg  q12 scheduled -pt sees psychiatry as outpt -antipsychotic agents: N/A  -continue melatonin to assist with sleep  -husband prefers not to add further medication for anxiety  -will need ongoing psych support at discharge 5. Neuropsych: This patientis notcapable of making decisions on herown behalf-given neuro-behavioral issues. 6. Skin/Wound Care:Routine skin checks 7. Fluids/Electrolytes/Nutrition:encourage po    -Megace  800mg  bid per home regimen    -Marinol--need to be observant for cognitive side effects  -needs reassurance, cueing with meals   Minimal p.o. intake 8. Hyper thyroidism. Held Tapazole 5 mg 3 times daily per #9. continue Inderal 60 mg daily.   -pt will see endocrine as outpt  9. Elevated LFT's-  LFTs trending downward on 10/12 and again  10/19.  -tapazole, DC'd  10.  Hypokalemia  Potassium 3.2 on 10/19, labs ordered for tomorrow  Supplement potassium  Continue to monitor 11.  Leukocytosis  WBCs 11.2 on 10/19, labs ordered for tomorrow  Afebrile 12.  Dysphagia  D3 thins with full supervision, advance diet as tolerated 13.  Dementia +/-pseudodementia  See #4   LOS: 20 days A FACE TO FACE EVALUATION WAS PERFORMED  Hannah Padilla Lorie Phenix 11/29/2019, 8:32 AM

## 2019-11-30 ENCOUNTER — Inpatient Hospital Stay (HOSPITAL_COMMUNITY): Payer: Medicare Other

## 2019-11-30 ENCOUNTER — Inpatient Hospital Stay (HOSPITAL_COMMUNITY): Payer: Medicare Other | Admitting: Speech Pathology

## 2019-11-30 LAB — BASIC METABOLIC PANEL
Anion gap: 12 (ref 5–15)
BUN: 22 mg/dL (ref 8–23)
CO2: 16 mmol/L — ABNORMAL LOW (ref 22–32)
Calcium: 9.8 mg/dL (ref 8.9–10.3)
Chloride: 111 mmol/L (ref 98–111)
Creatinine, Ser: 0.73 mg/dL (ref 0.44–1.00)
GFR, Estimated: >60 mL/min (ref >60)
Glucose, Bld: 94 mg/dL (ref 70–99)
Potassium: 4.2 mmol/L (ref 3.5–5.1)
Sodium: 139 mmol/L (ref 135–145)

## 2019-11-30 LAB — CBC
HCT: 48.2 % — ABNORMAL HIGH (ref 36.0–46.0)
Hemoglobin: 15.8 g/dL — ABNORMAL HIGH (ref 12.0–15.0)
MCH: 33 pg (ref 26.0–34.0)
MCHC: 32.8 g/dL (ref 30.0–36.0)
MCV: 100.6 fL — ABNORMAL HIGH (ref 80.0–100.0)
Platelets: 334 10*3/uL (ref 150–400)
RBC: 4.79 MIL/uL (ref 3.87–5.11)
RDW: 14.2 % (ref 11.5–15.5)
WBC: 9.5 10*3/uL (ref 4.0–10.5)
nRBC: 0 % (ref 0.0–0.2)

## 2019-11-30 LAB — HEPATIC FUNCTION PANEL
ALT: UNDETERMINED U/L (ref 0–44)
AST: 32 U/L (ref 15–41)
Albumin: 3.7 g/dL (ref 3.5–5.0)
Alkaline Phosphatase: 118 U/L (ref 38–126)
Bilirubin, Direct: UNDETERMINED mg/dL (ref 0.0–0.2)
Total Bilirubin: UNDETERMINED mg/dL (ref 0.3–1.2)
Total Protein: 5.9 g/dL — ABNORMAL LOW (ref 6.5–8.1)

## 2019-11-30 NOTE — Progress Notes (Signed)
[place] PHYSICAL MEDICINE & REHABILITATION PROGRESS NOTE   Subjective/Complaints: Pt up in chair with husband. He reports she's making progress with her mobility and intake.   ROS: Limited due to cognitive/behavioral   Objective:   No results found. No results for input(s): WBC, HGB, HCT, PLT in the last 72 hours. No results for input(s): NA, K, CL, CO2, GLUCOSE, BUN, CREATININE, CALCIUM in the last 72 hours.  Intake/Output Summary (Last 24 hours) at 11/30/2019 1053 Last data filed at 11/30/2019 0518 Gross per 24 hour  Intake 460 ml  Output 400 ml  Net 60 ml    Physical Exam: Vital Signs Blood pressure 117/71, pulse 67, temperature (!) 97.5 F (36.4 C), temperature source Oral, resp. rate 18, height 5\' 7"  (1.702 m), weight 57 kg, SpO2 100 %. Constitutional: No distress . Vital signs reviewed. HEENT: EOMI, oral membranes moist Neck: supple Cardiovascular: RRR without murmur. No JVD    Respiratory/Chest: CTA Bilaterally without wheezes or rales. Normal effort    GI/Abdomen: BS +, non-tender, non-distended Ext: no clubbing, cyanosis, or edema Psych: remains anxious. Flat and distracted Musc: No edema in extremities.  No tenderness in extremities. Neuro: Alert Dysphonia, unchanged--talks in nothing more than a whisper. Drank out of up on her own.  Motor exam limited due at least antigravity strength throughout, unchanged  Assessment/Plan: 1. Functional deficits secondary to acute, progressive dementia which require 3+ hours per day of interdisciplinary therapy in a comprehensive inpatient rehab setting.  Physiatrist is providing close team supervision and 24 hour management of active medical problems listed below.  Physiatrist and rehab team continue to assess barriers to discharge/monitor patient progress toward functional and medical goals  Care Tool:  Bathing    Body parts bathed by patient: Chest, Abdomen, Left arm, Right arm   Body parts bathed by helper: Front  perineal area, Buttocks     Bathing assist Assist Level: Maximal Assistance - Patient 24 - 49%     Upper Body Dressing/Undressing Upper body dressing   What is the patient wearing?: Pull over shirt    Upper body assist Assist Level: Maximal Assistance - Patient 25 - 49%    Lower Body Dressing/Undressing Lower body dressing      What is the patient wearing?: Underwear/pull up, Pants     Lower body assist Assist for lower body dressing: Total Assistance - Patient < 25%     Toileting Toileting    Toileting assist Assist for toileting: Total Assistance - Patient < 25%     Transfers Chair/bed transfer  Transfers assist     Chair/bed transfer assist level: Moderate Assistance - Patient 50 - 74% Chair/bed transfer assistive device:  (none)   Locomotion Ambulation   Ambulation assist   Ambulation activity did not occur: Safety/medical concerns (limited by anxiety and decreased strength/balance)  Assist level: Moderate Assistance - Patient 50 - 74% Assistive device: Walker-rolling Max distance: 110   Walk 10 feet activity   Assist  Walk 10 feet activity did not occur: Safety/medical concerns  Assist level: Moderate Assistance - Patient - 50 - 74% Assistive device: Walker-rolling   Walk 50 feet activity   Assist Walk 50 feet with 2 turns activity did not occur: Safety/medical concerns (single turn/72ft)  Assist level: Moderate Assistance - Patient - 50 - 74% Assistive device: Walker-rolling    Walk 150 feet activity   Assist Walk 150 feet activity did not occur: Safety/medical concerns         Walk 10 feet on  uneven surface  activity   Assist Walk 10 feet on uneven surfaces activity did not occur: Safety/medical concerns         Wheelchair     Assist Will patient use wheelchair at discharge?: No (not anticipated per PT long term goals)   Wheelchair activity did not occur: Safety/medical concerns (limited by decreased cognition and  anxiety)         Wheelchair 50 feet with 2 turns activity    Assist    Wheelchair 50 feet with 2 turns activity did not occur: Safety/medical concerns       Wheelchair 150 feet activity     Assist  Wheelchair 150 feet activity did not occur: Safety/medical concerns   Assist Level: Maximal Assistance - Patient 25 - 49%   Blood pressure 117/71, pulse 67, temperature (!) 97.5 F (36.4 C), temperature source Oral, resp. rate 18, height 5\' 7"  (1.702 m), weight 57 kg, SpO2 100 %.  Medical Problem List and Plan: 1.Progressive decrease in functional mobility, impaired cognition, apraxia with panic attacks generalized weakness and fallssecondary to acute metabolic encephalopathy/progressive dementiaof unknown etiology.  Symptoms most consistent with a functional neurological disorder. Continue supportive and restorative measures as we are doing.  -Continue Sinemet trial 25-100 mg 3 times daily  Continue therapies, family-ed. Husband working on ramp for home 2. Antithrombotics: -DVT/anticoagulation:Lovenox -antiplatelet therapy: N/A 3. Pain Management:Tylenolas needed. Has headache at times  Controlled on 10/25 4. Mood:Zoloft 50 mg daily, Valium12.5mg  q12 scheduled -pt sees psychiatry as outpt -antipsychotic agents: N/A  -continue melatonin to assist with sleep  -husband prefers not to add further medication for anxiety  -will need ongoing psych support at discharge 5. Neuropsych: This patientis notcapable of making decisions on herown behalf-given neuro-behavioral issues. 6. Skin/Wound Care:Routine skin checks 7. Fluids/Electrolytes/Nutrition:encourage po    -Megace  800mg  bid per home regimen    -Marinol--need to be observant for cognitive side effects  -needs reassurance, cueing with meals   Ate 40-10-75% yesterday.  8. Hyper thyroidism. Held Tapazole 5 mg 3 times daily per #9. continue  Inderal 60 mg daily.   -pt will see endocrine as outpt  9. Elevated LFT's-  LFTs trending downward on 10/12 and again 10/19.  -tapazole, DC'd  10.  Hypokalemia  Potassium 3.2 on 10/19, labs pending  Supplement potassium  Continue to monitor 11.  Leukocytosis  WBCs 11.2 on 10/19, labs still pending  Afebrile 12.  Dysphagia  D3 thins with full supervision, advance diet as tolerated  LOS: 21 days A FACE TO FACE EVALUATION WAS PERFORMED  Hannah Padilla 11/30/2019, 10:53 AM

## 2019-11-30 NOTE — Progress Notes (Addendum)
Patient ID: [name], female   DOB: [birth year], 69 y.o.   MRN: [id number]  SW spoke with pt husband [name] [phone]) to discuss pt discharge recommendations: HHA preference, and DME: hospital bed, RW, 3in1, BSC, and transport chair. Preferred HHA is [facility]. SW informed on DME referral being sent to [place]. He reports that he is continuing to work on ramp and almost completed. Also reports, that he will be here tomorrow for continued family education.  SW sent HHPT/OT/SLP/aide/SW referral to [facility]. SW waiting on follow-up. SW sent DME order to [place] via parachute.   *HH referral accepted by [facility].  Loralee Pacas, MSW, [place] Office: [phone] Cell: [phone] Fax: [phone]

## 2019-11-30 NOTE — Progress Notes (Signed)
Speech Language Pathology Daily Session Note  Patient Details  Name: Hannah Padilla MRN: [id number] Date of Birth: July 27, 1950  Today's Date: 11/30/2019 SLP Individual Time: 0700-0755 SLP Individual Time Calculation (min): 55 min  Short Term Goals: Week 3: SLP Short Term Goal 1 (Week 3): Patient will demonstrate sustained attention in 5 minute intervals with max A verbal cues. SLP Short Term Goal 2 (Week 3): Patient will demonstrate orientation to place and time with Mod A multimodal cues. SLP Short Term Goal 3 (Week 3): Pt will demonstrate basic problem solving skills by using call bell to request assistance with max A multimodal cues. SLP Short Term Goal 4 (Week 3): Pt will consume dys 3 textures and thin liquid diet with minimal overt s/s aspiration and min A verbal cues for use of swallow strategies. SLP Short Term Goal 5 (Week 3): Pt will increase vocal intensity to 75% intelligibility at the sentence level with max A verbal cues.  Skilled Therapeutic Interventions: Skilled treatment session focused on cognitive goals. SLP facilitated session by extra time and overall Max hand over hand assist for patient to initiate scooting EOB. Patient reported she needed to use the bathroom and was transferred to the commode via the [facility] and was continent of bowel. Patient required Max hand over hand for attention and initiation to self-care tasks. Patient was transferred to the wheelchair and required overall Max A verbal and tactile cues for self-feeding with universal cuff. Patient's husband present and provided ongoing education regarding cognitive goals of initiation and attention. Patient left upright in the wheelchair with husband present. Continue with current plan of care.      Pain Pain Assessment Pain Scale: 0-10 Pain Score: 0-No pain Faces Pain Scale: No hurt  Therapy/Group: Individual Therapy  Hannah Padilla, [place] 11/30/2019, 12:10 PM

## 2019-11-30 NOTE — Plan of Care (Signed)
  Problem: RH Ambulation Goal: LTG Patient will ambulate in controlled environment (PT) Description: LTG: Patient will ambulate in a controlled environment, # of feet with assistance (PT). Flowsheets (Taken 11/30/2019 0627) LTG: Pt will ambulate in controlled environ  assist needed:: Moderate Assistance - Patient 50 - 74% LTG: Ambulation distance in controlled environment: (upgraded goal due to patient's progress with activity tolerance and balance with mobility.) 150 ft Note: upgraded goal due to patient's progress with activity tolerance and balance with mobility.

## 2019-11-30 NOTE — Progress Notes (Signed)
Physical Therapy Session Note  Patient Details  Name: Hannah Padilla MRN: [id number] Date of Birth: 09/19/1950  Today's Date: 11/30/2019 PT Individual Time: 1315-1427 PT Individual Time Calculation (min): 72 min   Short Term Goals: Week 3:  PT Short Term Goal 1 (Week 3): Patient will perform basic transfer with mod A using LRAD consistently. PT Short Term Goal 2 (Week 3): Patient will ambulate with 50 feet with mod A demonstrating improved foot clearance to reduce fall risk. PT Short Term Goal 3 (Week 3): Patient's husband will initiate hands on training with all mobility.  Skilled Therapeutic Interventions/Progress Updates:     Patient in bed, RN performing in/out catheterization upon PT arrival, started PT session 15 min late due to nursing care. Patient alert and agreeable to PT session upon PT return. Patient denied pain during session. Patient not oriented at beginning of session, provided reorientation throughout session.   Therapeutic Activity: Bed Mobility: Patient performed supine to/from sit with min A with use of hospital bed features. Provided verbal cues and hand over hand assist for hand placement on bed rail. Transfers: Patient performed sit to/from stand from bed, BSC over the toilet x2, elevated toilet seat in ADL apartment, and w/c with mod-min A using RW. Provided verbal cues and hand over hand assist for placing hands on RW for increased forward weight shift to come to standing. PT stabilized RW for transfers for safety.  Patient attempted to have a BM in the room, but was unsuccessful at the beginning of the session. Patient taken to ADL apartment to simulate home mobility. Patient suddenly requested to have a BM, however, was unsuccessful on the ADL toilet. Patient then insisted on ambulating to her room, refused the w/c, see gait training below. She was continent of bowl on the [facility] over the toilet in the room. Required total A for peri-care and LB clothing management due to  decreased balance and motor planning.   Gait Training:  Patient ambulated 10 feet to/from the bathroom x2, 15 feet over carpet and tile in the ADL apartment x1, and 308 feet from the ADL apartment to her room using RW with min A-CGA for balance and min-mod A for AD device management to encourage forward propulsion and initiation of gait or turns. Ambulated with very slow gait speed, narrow BOS, shuffling gait, forward trunk lean, increased hip flexion with posterior hip placement, and decreased motor planning and scissoring on turns. Provided verbal cues for hip extension, increased BOS and step height, and encouragement throughout for continued progression with fatigue. Patient stated that "it felt good to walk" after longer ambulation trial. Noted improved balance and reciprocal stepping with increased repetition.   Patient in bed at end of session with breaks locked, bed alarm set, and all needs within reach.    Therapy Documentation Precautions:  Precautions Precautions: Fall Precaution Comments: anxious with mobility-resists posteriorly; tremors bilateral hands; watch skin integrity bilateral palms due to clenching fists intermittently Restrictions Weight Bearing Restrictions: No   Therapy/Group: Individual Therapy  Sheranda Seabrooks L Gracious Renken PT, DPT  11/30/2019, 4:56 PM

## 2019-11-30 NOTE — Progress Notes (Signed)
Occupational Therapy Session Note  Patient Details  Name: Hannah Padilla MRN: [id number] Date of Birth: 02/21/1950  Today's Date: 11/30/2019 OT Individual Time: 1000-1100 OT Individual Time Calculation (min): 60 min    Short Term Goals: Week 3:  OT Short Term Goal 1 (Week 3): Pt will don shirt wiht MIN A OT Short Term Goal 2 (Week 3): Pt will sequence oral care with MOD VC  Skilled Therapeutic Interventions/Progress Updates:    Pt received sitting up in the w/c with no c/o pain. Pt requesting to use the bathroom. She stood from the w/c with min A. Mod facilitation for hand placement on the RW. Pt completed ambulatory transfer into the bathroom with min A, short shuffling gait and mod facilitation for RW management. Pt transferred to toilet and had (+) incontinent and continent loose BM. Pt transferred into the shower with mod A and max cueing for facilitation. Pt washed UB with min A and LB with max A. Improvement in initiation overall this session. Pt completed transfer back to toilet for another BM void with mod A. Max A overall for toileting tasks. Pt donned shirt with max A, pants with max A. Pt returned to the bed with mod A, poor RW management. Pt with delusions throughout sessions and nonsensical speech. Pt was left supine with all needs met.   Therapy Documentation Precautions:  Precautions Precautions: Fall Precaution Comments: anxious with mobility-resists posteriorly; tremors bilateral hands; watch skin integrity bilateral palms due to clenching fists intermittently Restrictions Weight Bearing Restrictions: No  Therapy/Group: Individual Therapy   H  11/30/2019, 6:40 AM 

## 2019-12-01 ENCOUNTER — Inpatient Hospital Stay (HOSPITAL_COMMUNITY): Payer: Medicare Other

## 2019-12-01 LAB — URINALYSIS, ROUTINE W REFLEX MICROSCOPIC
Bilirubin Urine: NEGATIVE
Glucose, UA: NEGATIVE mg/dL
Hgb urine dipstick: NEGATIVE
Ketones, ur: 5 mg/dL — AB
Nitrite: POSITIVE — AB
Protein, ur: NEGATIVE mg/dL
Specific Gravity, Urine: 1.023 (ref 1.005–1.030)
WBC, UA: >50 WBC/hpf — ABNORMAL HIGH (ref 0–5)
pH: 5 (ref 5.0–8.0)

## 2019-12-01 MED ORDER — ENSURE ENLIVE PO LIQD
237.0000 mL | Freq: Three times a day (TID) | ORAL | Status: DC
  Administered 2019-12-01 – 2019-12-04 (×7): 237 mL via ORAL

## 2019-12-01 MED ORDER — SODIUM CHLORIDE 0.9 % IV SOLN
1.0000 g | INTRAVENOUS | Status: DC
  Administered 2019-12-02 – 2019-12-03 (×3): 1 g via INTRAVENOUS
  Filled 2019-12-01 (×2): qty 10
  Filled 2019-12-01: qty 1
  Filled 2019-12-01: qty 10

## 2019-12-01 MED ORDER — SODIUM CHLORIDE 0.45 % IV SOLN: INTRAVENOUS | Status: DC

## 2019-12-01 MED ORDER — DIAZEPAM 5 MG PO TABS
5.0000 mg | ORAL_TABLET | Freq: Two times a day (BID) | ORAL | Status: DC
  Administered 2019-12-01 (×2): 5 mg via ORAL
  Filled 2019-12-01: qty 1

## 2019-12-01 MED ORDER — SERTRALINE HCL 100 MG PO TABS
100.0000 mg | ORAL_TABLET | Freq: Every day | ORAL | Status: DC
  Administered 2019-12-02 – 2019-12-04 (×3): 100 mg via ORAL
  Filled 2019-12-01 (×3): qty 1

## 2019-12-01 MED ORDER — DIAZEPAM 5 MG PO TABS
2.5000 mg | ORAL_TABLET | Freq: Two times a day (BID) | ORAL | Status: DC
  Administered 2019-12-02 – 2019-12-04 (×5): 2.5 mg via ORAL
  Filled 2019-12-01 (×5): qty 1

## 2019-12-01 MED ORDER — SERTRALINE HCL 50 MG PO TABS
50.0000 mg | ORAL_TABLET | Freq: Once | ORAL | Status: AC
  Administered 2019-12-01: 50 mg via ORAL
  Filled 2019-12-01: qty 1

## 2019-12-01 NOTE — Progress Notes (Signed)
[place] [facility] PHYSICAL MEDICINE & REHABILITATION PROGRESS NOTE   Subjective/Complaints: Pt in bed, very anxious again today. Seems that anxiety has been worse over the last few days. Nurse noted also  ROS: Limited due to cognitive/behavioral    Objective:   No results found. Recent Labs    11/30/19 1603  WBC 9.5  HGB 15.8*  HCT 48.2*  PLT 334   Recent Labs    11/30/19 1603  NA 139  K 4.2  CL 111  CO2 16*  GLUCOSE 94  BUN 22  CREATININE 0.73  CALCIUM 9.8    Intake/Output Summary (Last 24 hours) at 12/01/2019 0924 Last data filed at 12/01/2019 0045 Gross per 24 hour  Intake 120 ml  Output 575 ml  Net -455 ml    Physical Exam: Vital Signs Blood pressure 109/63, pulse 64, temperature 97.6 F (36.4 C), resp. rate 18, height 5\' 7"  (1.702 m), weight 57 kg, SpO2 100 %. Constitutional: No distress . Vital signs reviewed. HEENT: EOMI, oral membranes moist Neck: supple Cardiovascular: RRR without murmur. No JVD    Respiratory/Chest: CTA Bilaterally without wheezes or rales. Normal effort    GI/Abdomen: BS +, non-tender, non-distended Ext: no clubbing, cyanosis, or edema Psych: very anxious, very distracted today. Musc: No edema in extremities.  No tenderness in extremities. Neuro: Alert but speaks at nothing more than whisper. Needs cues for basic motor tasks this morning. Motor exam limited due at least antigravity strength throughout, unchanged  Assessment/Plan: 1. Functional deficits secondary to acute, progressive dementia which require 3+ hours per day of interdisciplinary therapy in a comprehensive inpatient rehab setting.  Physiatrist is providing close team supervision and 24 hour management of active medical problems listed below.  Physiatrist and rehab team continue to assess barriers to discharge/monitor patient progress toward functional and medical goals  Care Tool:  Bathing    Body parts bathed by patient: Chest, Abdomen, Left arm, Right arm   Body  parts bathed by helper: Front perineal area, Buttocks     Bathing assist Assist Level: Maximal Assistance - Patient 24 - 49%     Upper Body Dressing/Undressing Upper body dressing   What is the patient wearing?: Pull over shirt    Upper body assist Assist Level: Maximal Assistance - Patient 25 - 49%    Lower Body Dressing/Undressing Lower body dressing      What is the patient wearing?: Underwear/pull up, Pants     Lower body assist Assist for lower body dressing: Total Assistance - Patient < 25%     Toileting Toileting    Toileting assist Assist for toileting: Total Assistance - Patient < 25%     Transfers Chair/bed transfer  Transfers assist     Chair/bed transfer assist level: Moderate Assistance - Patient 50 - 74% Chair/bed transfer assistive device: Programmer, multimedia   Ambulation assist   Ambulation activity did not occur: Safety/medical concerns (limited by anxiety and decreased strength/balance)  Assist level: Minimal Assistance - Patient > 75% Assistive device: Walker-rolling Max distance: 308 ft   Walk 10 feet activity   Assist  Walk 10 feet activity did not occur: Safety/medical concerns  Assist level: Minimal Assistance - Patient > 75% Assistive device: Walker-rolling   Walk 50 feet activity   Assist Walk 50 feet with 2 turns activity did not occur: Safety/medical concerns (single turn/56ft)  Assist level: Minimal Assistance - Patient > 75% Assistive device: Walker-rolling    Walk 150 feet activity   Assist Walk 150  feet activity did not occur: Safety/medical concerns  Assist level: Minimal Assistance - Patient > 75% Assistive device: Walker-rolling    Walk 10 feet on uneven surface  activity   Assist Walk 10 feet on uneven surfaces activity did not occur: Safety/medical concerns         Wheelchair     Assist Will patient use wheelchair at discharge?: No (not anticipated per PT long term goals)    Wheelchair activity did not occur: Safety/medical concerns (limited by decreased cognition and anxiety)         Wheelchair 50 feet with 2 turns activity    Assist    Wheelchair 50 feet with 2 turns activity did not occur: Safety/medical concerns       Wheelchair 150 feet activity     Assist  Wheelchair 150 feet activity did not occur: Safety/medical concerns   Assist Level: Maximal Assistance - Patient 25 - 49%   Blood pressure 109/63, pulse 64, temperature 97.6 F (36.4 C), resp. rate 18, height 5\' 7"  (1.702 m), weight 57 kg, SpO2 100 %.  Medical Problem List and Plan: 1.Progressive decrease in functional mobility, impaired cognition, apraxia with panic attacks generalized weakness and fallssecondary to acute metabolic encephalopathy/progressive dementiaof unknown etiology.  Symptoms most consistent with a functional neurological disorder. -Continue Sinemet trial 25-100 mg 3 times daily  -team conference today 2. Antithrombotics: -DVT/anticoagulation:Lovenox -antiplatelet therapy: N/A 3. Pain Management:Tylenolas needed. Has headache at times  Controlled on 10/26 4. Mood:Zoloft 50 mg daily, Valium2.5mg  q12 scheduled -pt sees psychiatry as outpt -antipsychotic agents: N/A  -continue melatonin to assist with sleep  -husband prefers not to add further medication for anxiety  -will need ongoing psych support at discharge  -10/26 given increased anxiety, will try 5mg  valium q12. Consider buspar, discussed with husband this morning 5. Neuropsych: This patientis notcapable of making decisions on herown behalf-given neuro-behavioral issues. 6. Skin/Wound Care:Routine skin checks 7. Fluids/Electrolytes/Nutrition:encourage po    -Megace  800mg  bid per home regimen    -Marinol--need to be observant for cognitive side effects  -needs reassurance, cueing with meals   Ate little to nothing yesterday  10/25  -requested weight yesterday, none recorded. Requesting again today.  8. Hyper thyroidism. Held Tapazole 5 mg 3 times daily per #9. continue Inderal 60 mg daily.   -pt will see endocrine as outpt  9. Elevated LFT's d/t tapazole  LFT's back to WNL  -tapazole, DC'd  10.  Hypokalemia  Potassium 4.2 10/25  continue potassium supp  Continue to monitor 11.  Leukocytosis  WBCs down to 9.5 10/25  Afebrile 12.  Dysphagia  D3 thins with full supervision, advance diet as tolerated  LOS: 22 days A FACE TO FACE EVALUATION WAS PERFORMED  Hannah Padilla 12/01/2019, 9:24 AM

## 2019-12-01 NOTE — Progress Notes (Signed)
Initial Nutrition Assessment  DOCUMENTATION CODES:   Severe malnutrition in context of chronic illness  INTERVENTION:   Pt with a 29% weight loss in less than 4 months and severe malnutrition. If within pt's [place], recommend Cortrak NG tube placement and initiation of enteral nutrition. Recommend: - Start Osmolite 1.2 @ 20 ml/hr and increase rate by 10 ml q 8 hours to goal rate of 60 ml/hr (1440 ml/day)  Recommended tube feeding regimen would provide 1728 kcal, 80 grams of protein, and 1181 ml of H2O.   - Ensure Enlive po TID, each supplement provides 350 kcal and 20 grams of protein  - Magic Cup TID with meals, each supplement provides 290 kcal and 9 grams of protein  - ProSource Plus 30 ml po BID, each supplement provides 100 kcal and 15 grams of protein  NUTRITION DIAGNOSIS:   Severe Malnutrition related to chronic illness (progressive dementia) as evidenced by moderate fat depletion, severe fat depletion, moderate muscle depletion, severe muscle depletion, percent weight loss (29% weight loss in less than 4 months).  GOAL:   Patient will meet greater than or equal to 90% of their needs  MONITOR:   PO intake, Supplement acceptance, Labs, Weight trends, I & O's  REASON FOR ASSESSMENT:   Malnutrition Screening Tool    ASSESSMENT:   69 year old female with PMH of hyperthyroidism, recurrent UTIs, [place] spotted fever. Presented 10/20/19 with progressive decline with apraxia over the last 3 months as well as intermittent panic attacks, generalized weakness, and falls. Pt found to have progressive dementia with uncertain etiology. Admitted to CIR on 10/05.   Noted target d/c date of 10/29.  Spoke with RN who reports pt is refusing PO intake today and having increased anxiety and screaming when feeding assistance is attempted. Pt on both marinol and megace for appetite stimulation.  Reviewed weight history in chart. Pt with a 22.2 kg (49 lb) weight loss since 08/11/19. This  is a 29% weight loss in less than 4 months which is severe and significant for timeframe.  Per Palliative note, "no feeding tube now or in the future." Unsure whether this is referring to temporary feeding tube like Cortrak vs permanent feeding tube like PEG. Pt with severe malnutrition. RD has reached out to MD via Secure Chat regarding recommendation for tube feeds if within pt's [place].  Spoke briefly with pt at bedside. Pt exhibiting signs of anxiety so visit was kept short. Pt agreed to NFPE.  Meal Completion: 0-75% x last 8 documented meals  Medications reviewed and include: ProSource Plus BID, sinemet, marinol, Boost Breeze BID, megace, protonix, miralax, klor-con 20 mEq daily, vitamin D weekly  Labs reviewed.  UOP: 575 ml x 24 hours  NUTRITION - FOCUSED PHYSICAL EXAM:    Most Recent Value  Orbital Region Moderate depletion  Upper Arm Region Moderate depletion  Thoracic and Lumbar Region Severe depletion  Buccal Region Moderate depletion  Temple Region Moderate depletion  Clavicle Bone Region Severe depletion  Clavicle and Acromion Bone Region Severe depletion  Scapular Bone Region Moderate depletion  Dorsal Hand Moderate depletion  Patellar Region Moderate depletion  Anterior Thigh Region Severe depletion  Posterior Calf Region Moderate depletion  Edema (RD Assessment) None  Hair Reviewed  Eyes Reviewed  Mouth Reviewed  Skin Reviewed  Nails Reviewed       Diet Order:   Diet Order            DIET DYS 3 Room service appropriate? Yes; Fluid consistency: Thin  Diet effective now                 EDUCATION NEEDS:   Not appropriate for education at this time  Skin:  Skin Assessment: Reviewed RN Assessment  Last BM:  12/01/19  Height:   Ht Readings from Last 1 Encounters:  11/10/19 5\' 7"  (1.702 m)    Weight:   Wt Readings from Last 1 Encounters:  12/01/19 54.9 kg    Ideal Body Weight:  61.4 kg  BMI:  Body mass index is 18.96 kg/m.  Estimated  Nutritional Needs:   Kcal:  1700-1900  Protein:  75-90 grams  Fluid:  >/= 1.7 L    Gaynell Face, MS, RD, LDN Inpatient Clinical Dietitian Please see AMi[facility] for contact information.

## 2019-12-01 NOTE — Progress Notes (Signed)
Patient ID: Hannah Padilla, female   DOB: Sep 03, 1950, 69 y.o.   MRN: [id number]  SW left message for husband [name] to check in with regard to delivery of DME. SW waiting on follow-up. *Pt husband left message stating that DME is scheduled for delivery tomorrow between 3pm-pm.   Loralee Pacas, MSW, [place] Office: [phone] Cell: [phone] Fax: [phone]

## 2019-12-01 NOTE — Progress Notes (Signed)
Pt continues to refuse meals with staff. Encouraging fluids and pt to eat. Pt screams and has increased anxiety when trying to be assisted with meals. Pt crying at this time, support given. Left room per pt request.  Sheela Stack, LPN

## 2019-12-01 NOTE — Progress Notes (Signed)
Physical Therapy Note  Patient Details  Name: Hannah Padilla MRN: [id number] Date of Birth: Feb 14, 1950 Today's Date: 12/01/2019    Patient in bed upon PT arrival. Patient visibally shaking and asking why she was in a pharmacy. Re-oriented patient to place and situation. Patient then asking to go to sleep. Patient declined all mobility or bed level exercises offered and continued to request to go to sleep. Provided patient with multiple covers for improved comfort. Patient calm with eyes closed in the bed with breaks locked, bed alarm set, and all needs in reach upon PT departure. Patient missed 20 min of skilled PT due to fatigue/anxiety, RN made aware. Will attempt to make-up missed time as able.      Rozanne Heumann L Patsie Mccardle PT, DPT  12/01/2019, 5:28 PM

## 2019-12-01 NOTE — Patient Care Conference (Addendum)
Inpatient RehabilitationTeam Conference and Plan of Care Update Date: 12/01/2019   Time: 10:06 AM    Patient Name: Hannah Padilla      Medical Record Number: [id number]  Date of Birth: [birth year] Sex: Female         Room/Bed: 4W03C/4W03C-01 Payor Info: Payor: [employer] park [employer] MEDICARE / Plan: [employer] MEDICARE / Product Type: *No Product type* /    Admit Date/Time:  11/09/2019  4:43 PM  Primary Diagnosis:  Progressive dementia with uncertain etiology [facility])  Hospital Problems: Principal Problem:   Progressive dementia with uncertain etiology ([place]) Active Problems:   DNR (do not resuscitate) discussion   Acute metabolic encephalopathy   Anxiety disorder due to general medical condition   Hypoalbuminemia due to protein-calorie malnutrition ([place])   Failure to thrive in adult   Palliative care by specialist    Expected Discharge Date: Expected Discharge Date: 12/04/19  Team Members Present: Physician leading conference: Dr. Alger Simons Care Coodinator Present: Loralee Pacas, LCSWA;Faris Coolman Creig Hines, RN, BSN, CRRN Nurse Present: Annita Brod, LPN PT Present: Apolinar Junes, PT OT Present: Laverle Hobby, OT SLP Present: Weston Anna, SLP PPS Coordinator present : Ileana Ladd, Burna Mortimer, SLP     Current Status/Progress Goal Weekly Team Focus  Bowel/Bladder   Pt is incont of b/b, LBM 11/30/19  To become more continent of bowel/bladder.  Q2h toileting/PRN   Swallow/Nutrition/ Hydration   Dys. 3 textures with thin liquids, Min A  Min A  use of swallowing strategies, family education   ADL's   Max cueing for initiation/sequencing, max A ADLs, max A toileting, improvements in transfers, initiation, and standing balance  mod A overall  Initiation, sequencing, ADL retraining, family education, d/c planning   Mobility   mod-min A overall, gait 308 ft with RW  mod-min A overall, d/c w/c goals due to poor functional progress  Caregiver education and hands on training,  functional mobility, gait training, activity tolerance, balance, cognitive remediation   Communication   Mod-Max A  Mod A  RMT, use of an incresed vocal intensity, family education   Safety/Cognition/ Behavioral Observations  Max-Tptal A  Mod A  orientation, attention, initiation, family education   Pain   Pt c/o generalized pain  To remain pain free.  Assess pain qshift/PRN   Skin   Skin intact, Ecchymosis to lower abdomen  To prevent skin breakdown from occurring.  Assess skin qshift/prn     Discharge Planning:  Pt to d/c to home with 24/7 care from husband. Pt husband is working on building the ramp- believes it will be finished by pt discharge. Pt set up for HHPT/OT/SLP/Aide/SW with [facility]. DME ordered.   Team Discussion: Incontinent, requiring I&O cathing. Anxiety decreases when husband is in the room. OT reports patient requires max cueing, max assist, reducing stimulation in the room will also reduce anxiety. PT reports patient mod to min assist with RW and making gains. Nursing to still use the stedy due to inconsistency. SLP reports patient has poor initiation and attention is at a mod to max assist. Patient on target to meet rehab goals: yes  *See Care Plan and progress notes for long and short-term goals.   Revisions to Treatment Plan:  MD adjusted Valium, double dose of Zoloft.  Teaching Needs: Continue family education with husband.  Current Barriers to Discharge: Medical stability, Medication compliance, Behavior and Nutritional means  Possible Resolutions to Barriers: Continue to encourage dietary supplements, and stimulants, continue with current medication regimen.     Medical  Summary Current Status: functional neurological disorder. anxiety has worsened over last several days. intake has suffered as a result  Barriers to Discharge: Behavior;Medical stability   Possible Resolutions to [employer] Focus: adjustments to medical regimen. ongoing acclimation,  dietary stimulants and supplements.   Continued Need for Acute Rehabilitation Level of Care: The patient requires daily medical management by a physician with specialized training in physical medicine and rehabilitation for the following reasons: Direction of a multidisciplinary physical rehabilitation program to maximize functional independence : Yes Medical management of patient stability for increased activity during participation in an intensive rehabilitation regime.: Yes Analysis of laboratory values and/or radiology reports with any subsequent need for medication adjustment and/or medical intervention. : Yes   I attest that I was present, lead the team conference, and concur with the assessment and plan of the team.   Cristi Loron 12/01/2019, 1:52 PM

## 2019-12-01 NOTE — Progress Notes (Signed)
Pt more anxious and gradually more confused over last 2-3 days. Began hallucinating this afternoon. Also having more difficulty emptying bladder. Po intake has fallen off over this time as well.   Stopped marinol earlier today given cognitive side effects. Just this morning increased valium to help control anxiety.   UA with leukocytes, nitrates, many bacteria, >50 WBC's. UCX pending  Plan:  Begin empiric rocephin 1g IV q24 Begin 1/2NS IVF 50cc/hr Reduce valium back to 2.5mg  q12 CBC, BMET, prealbumin tomorrow   Meredith Staggers, MD, [place] [facility] 12/01/2019

## 2019-12-01 NOTE — Progress Notes (Signed)
Physical Therapy Session Note  Patient Details  Name: Hannah Padilla MRN: [id number] Date of Birth: Oct 01, 1950  Today's Date: 12/01/2019 PT Individual Time: 1115-1210 PT Individual Time Calculation (min): 55 min   Short Term Goals: Week 3:  PT Short Term Goal 1 (Week 3): Patient will perform basic transfer with mod A using LRAD consistently. PT Short Term Goal 2 (Week 3): Patient will ambulate with 50 feet with mod A demonstrating improved foot clearance to reduce fall risk. PT Short Term Goal 3 (Week 3): Patient's husband will initiate hands on training with all mobility.  Skilled Therapeutic Interventions/Progress Updates:     Patient in bed asleep with her husband in the room upon PT arrival. Patient easily aroused and agreeable to PT session.   Patient denied pain during session.  Discussed d/c planning, home set-up, equipment, and goals for patient at home with patient's husband at beginning of session. He reports that the ramp is functional and only needs rails placed at this time. Plans to drive their vehicle up to the ramp for home entry. Discussed use of hospital bed on first floor at this time due to minimal progress on stair training with patient during her stay. Educated that stairs would not be safe to perform at this time and patient could continue to progress as able with HHPT. Patient's husband stated understanding. He asked about ambulation outside and on the ramp. Also discouraged these activities due to decreased safety/balance with gait and increased distractions. Again encouraged to progress as able with HHPT. Currently recommending performing bed mobility with use of hospital bed features for reduced caregiver burden, transfers and gait short distances, up to 25 ft, with the RW. Encouraged patient to have a safe chair, recliner with elevated seat height to sit in throughout the day to promote OOB activity and tolerance. Discussed car transfer into low SUV. Will attempt to  simulate transfer in car simulator later this week. Patient's husband in agreement with patient goals and safety concerns at d/c and attentive throughout education/discussion.   Patient's husband performed the following mobility in the room with the patient with min cues for safe technique.   Therapeutic Activity: Bed Mobility: Patient performed supine to/from sit with min A. Provided verbal cues for assisting patient to initiate movement then allow her to do as much of it as she can. Transfers: Patient performed sit to/from stand x2 with min-mod A using RW. Provided verbal cues for assisting to boost the patient from the gait belt rather than her arm to maintain shoulder joint integrity over time.  Gait Training:  Patient ambulated 10 feet x2 using RW with min-mod A. Ambulated with shuffling gait with mild improvement in B foot clearance today, narrow BOS, increased hip flexion with posterior placement, and downward head gaze. Provided verbal cues to patient's husband for leading patient with the RW for improved initiation and step length.  Patient in bed with her husband getting ready to leave the room at end of session with breaks locked, bed alarm set, and all needs within reach.   Therapy Documentation Precautions:  Precautions Precautions: Fall Precaution Comments: anxious with mobility-resists posteriorly; tremors bilateral hands; watch skin integrity bilateral palms due to clenching fists intermittently Restrictions Weight Bearing Restrictions: No General: PT Amount of Missed Time (min): 20 Minutes PT Missed Treatment Reason: Patient fatigue Vital Signs: Therapy Vitals Temp: 97.8 F (36.6 C) Pulse Rate: (!) 59 Resp: 19 BP: 96/64 Patient Position (if appropriate): Lying Oxygen Therapy SpO2: 100 % O2 Device:  Room Air Pain:   Mobility:   Locomotion :    Trunk/Postural Assessment :    Balance:   Exercises:   Other Treatments:      Therapy/Group: Individual  Therapy  Mayra Jolliffe L Lynnita Somma PT, DPT  12/01/2019, 5:14 PM

## 2019-12-01 NOTE — Progress Notes (Signed)
Occupational Therapy Session Note  Patient Details  Name: Hannah Padilla MRN: [id number] Date of Birth: 1950-02-11  Today's Date: 12/01/2019 OT Individual Time: 6816-6196 OT Individual Time Calculation (min): 75 min   Session 2: OT Individual Time: 9409-8286 OT Individual Time Calculation (min): 40 min    Short Term Goals: Week 3:  OT Short Term Goal 1 (Week 3): Pt will don shirt wiht MIN A OT Short Term Goal 2 (Week 3): Pt will sequence oral care with MOD VC  Skilled Therapeutic Interventions/Progress Updates:    Pt received supine with no c/o pain however very visibly anxious and shaking. Reduced stimulation in the room, including light, TV, and reduced staff members and pt anxiety visibly improved. Increased time required throughout session for processing and to allow pt to attempt and initiate commands. Pt requested to use the bathroom. Pt's husband Hannah Padilla was present and leading through most of the session. [name] was able to guide pt to EOB with ~mod A overall. Pt completed ambulatory transfer with pt into the bathroom with cueing required for caregiver positioning, body mechanics, and safety. Pt required max A for toileting tasks. Pt returned to w/c via ambulatory transfer with [name] assisting. Pt completed oral care at the sink with mod A. Heavy cueing and HOH required for initiation. Pt completed stand pivot transfer back to bed with [name] assisting. Pt left supine with all needs met, [name] present.    Session 2:  Pt received supine, incredibly anxious, trembling and whispering to herself. Pt initially stated she needed to use the bathroom. She was provided mod A to transition to EOB with good participation. Pt became overwhelmed EOB and began crying and confused stating "who are you and what are you doing". Gentle reorientation provided but pt inconsolable. Pt guided back to supine, with very intentional and mindful tactile input to reduce anxiety. Pt was calm for several minutes before  anxiety rose again 2/2 active hallucinations. Pt took several sips of her drink with min A and then refused to finish protein supplement provided by LPN. Trialed weighted pillow on pt's lap to increase grounding via heavy proprioceptive input, but pt unable to tolerate. Session terminated 2/2 anxiety and hallucinations. LPN aware.   Therapy Documentation Precautions:  Precautions Precautions: Fall Precaution Comments: anxious with mobility-resists posteriorly; tremors bilateral hands; watch skin integrity bilateral palms due to clenching fists intermittently Restrictions Weight Bearing Restrictions: No  Therapy/Group: Individual Therapy  Curtis Sites 12/01/2019, 7:27 AM

## 2019-12-02 ENCOUNTER — Inpatient Hospital Stay (HOSPITAL_COMMUNITY): Payer: Medicare Other

## 2019-12-02 ENCOUNTER — Inpatient Hospital Stay (HOSPITAL_COMMUNITY): Payer: Medicare Other | Admitting: Speech Pathology

## 2019-12-02 ENCOUNTER — Encounter (HOSPITAL_COMMUNITY): Payer: Medicare Other | Admitting: Psychology

## 2019-12-02 ENCOUNTER — Inpatient Hospital Stay (HOSPITAL_COMMUNITY): Payer: Medicare Other | Admitting: Physical Therapy

## 2019-12-02 DIAGNOSIS — E43 Unspecified severe protein-calorie malnutrition: Secondary | ICD-10-CM | POA: Insufficient documentation

## 2019-12-02 LAB — BASIC METABOLIC PANEL
Anion gap: 14 (ref 5–15)
BUN: 17 mg/dL (ref 8–23)
CO2: 16 mmol/L — ABNORMAL LOW (ref 22–32)
Calcium: 9.5 mg/dL (ref 8.9–10.3)
Chloride: 110 mmol/L (ref 98–111)
Creatinine, Ser: 0.78 mg/dL (ref 0.44–1.00)
GFR, Estimated: >60 mL/min (ref >60)
Glucose, Bld: 82 mg/dL (ref 70–99)
Potassium: 3.8 mmol/L (ref 3.5–5.1)
Sodium: 140 mmol/L (ref 135–145)

## 2019-12-02 LAB — CBC
HCT: 45.5 % (ref 36.0–46.0)
Hemoglobin: 15.2 g/dL — ABNORMAL HIGH (ref 12.0–15.0)
MCH: 33.3 pg (ref 26.0–34.0)
MCHC: 33.4 g/dL (ref 30.0–36.0)
MCV: 99.8 fL (ref 80.0–100.0)
Platelets: 313 10*3/uL (ref 150–400)
RBC: 4.56 MIL/uL (ref 3.87–5.11)
RDW: 14.1 % (ref 11.5–15.5)
WBC: 7.4 10*3/uL (ref 4.0–10.5)
nRBC: 0 % (ref 0.0–0.2)

## 2019-12-02 LAB — PREALBUMIN: Prealbumin: 29.8 mg/dL (ref 18–38)

## 2019-12-02 MED ORDER — POTASSIUM CHLORIDE CRYS ER 20 MEQ PO TBCR
40.0000 meq | EXTENDED_RELEASE_TABLET | Freq: Two times a day (BID) | ORAL | Status: AC
  Administered 2019-12-02 – 2019-12-03 (×2): 40 meq via ORAL
  Filled 2019-12-02 (×2): qty 2

## 2019-12-02 NOTE — Progress Notes (Addendum)
[facility] PHYSICAL MEDICINE & REHABILITATION PROGRESS NOTE   Subjective/Complaints: Continues to have agitation, hallucinations, states her husband has been gone for 2 days, sitting at edge of bed with therapy. When asked if anything is bothering her, states "the whole room." Asks for some water and drinks well. K+ and prealbumin low on today's labs  ROS: Limited due to cognitive/behavioral    Objective:   No results found. Recent Labs    11/30/19 1603 12/02/19 0627  WBC 9.5 7.4  HGB 15.8* 15.2*  HCT 48.2* 45.5  PLT 334 313   Recent Labs    11/30/19 1603 12/02/19 0627  NA 139 140  K 4.2 3.8  CL 111 110  CO2 16* 16*  GLUCOSE 94 82  BUN 22 17  CREATININE 0.73 0.78  CALCIUM 9.8 9.5    Intake/Output Summary (Last 24 hours) at 12/02/2019 1248 Last data filed at 12/02/2019 1242 Gross per 24 hour  Intake 60 ml  Output 585 ml  Net -525 ml    Physical Exam: Vital Signs Blood pressure (!) 108/58, pulse (!) 52, temperature 98.3 F (36.8 C), resp. rate 17, height 5\' 7"  (1.702 m), weight 55.4 kg, SpO2 100 %.  General: Alert and oriented, No apparent distress HEENT: Head is normocephalic, atraumatic, PERRLA, EOMI, sclera anicteric, oral mucosa pink and moist, dentition intact, ext ear canals clear,  Neck: Supple without JVD or lymphadenopathy Heart: Bradycardic. No murmurs rubs or gallops Chest: CTA bilaterally without wheezes, rales, or rhonchi; no distress Abdomen: Soft, non-tender, non-distended, bowel sounds positive. Extremities: No clubbing, cyanosis, or edema. Pulses are 2+ Skin: Clean and intact without signs of breakdown Psych: very anxious, very distracted today. Musc: No edema in extremities.  No tenderness in extremities. Neuro: Alert but speaks at nothing more than whisper. Needs cues for basic motor tasks this morning. Motor exam limited due at least antigravity strength throughout, unchanged  Assessment/Plan: 1. Functional deficits secondary to acute,  progressive dementia which require 3+ hours per day of interdisciplinary therapy in a comprehensive inpatient rehab setting.  Physiatrist is providing close team supervision and 24 hour management of active medical problems listed below.  Physiatrist and rehab team continue to assess barriers to discharge/monitor patient progress toward functional and medical goals  Care Tool:  Bathing    Body parts bathed by patient: Chest, Abdomen, Left arm, Right arm   Body parts bathed by helper: Front perineal area, Buttocks     Bathing assist Assist Level: Maximal Assistance - Patient 24 - 49%     Upper Body Dressing/Undressing Upper body dressing   What is the patient wearing?: Pull over shirt    Upper body assist Assist Level: Maximal Assistance - Patient 25 - 49%    Lower Body Dressing/Undressing Lower body dressing      What is the patient wearing?: Underwear/pull up, Pants     Lower body assist Assist for lower body dressing: Total Assistance - Patient < 25%     Toileting Toileting    Toileting assist Assist for toileting: Total Assistance - Patient < 25%     Transfers Chair/bed transfer  Transfers assist     Chair/bed transfer assist level: Dependent - mechanical lift Chair/bed transfer assistive device: Programmer, multimedia   Ambulation assist   Ambulation activity did not occur: Safety/medical concerns (limited by anxiety and decreased strength/balance)  Assist level: Minimal Assistance - Patient > 75% Assistive device: Walker-rolling Max distance: 308 ft   Walk 10 feet activity  Assist  Walk 10 feet activity did not occur: Safety/medical concerns  Assist level: Minimal Assistance - Patient > 75% Assistive device: Walker-rolling   Walk 50 feet activity   Assist Walk 50 feet with 2 turns activity did not occur: Safety/medical concerns (single turn/70ft)  Assist level: Minimal Assistance - Patient > 75% Assistive device: Walker-rolling     Walk 150 feet activity   Assist Walk 150 feet activity did not occur: Safety/medical concerns  Assist level: Minimal Assistance - Patient > 75% Assistive device: Walker-rolling    Walk 10 feet on uneven surface  activity   Assist Walk 10 feet on uneven surfaces activity did not occur: Safety/medical concerns         Wheelchair     Assist Will patient use wheelchair at discharge?: No (not anticipated per PT long term goals)   Wheelchair activity did not occur: Safety/medical concerns (limited by decreased cognition and anxiety)         Wheelchair 50 feet with 2 turns activity    Assist    Wheelchair 50 feet with 2 turns activity did not occur: Safety/medical concerns       Wheelchair 150 feet activity     Assist  Wheelchair 150 feet activity did not occur: Safety/medical concerns   Assist Level: Maximal Assistance - Patient 25 - 49%   Blood pressure (!) 108/58, pulse (!) 52, temperature 98.3 F (36.8 C), resp. rate 17, height 5\' 7"  (1.702 m), weight 55.4 kg, SpO2 100 %.  Medical Problem List and Plan: 1.Progressive decrease in functional mobility, impaired cognition, apraxia with panic attacks generalized weakness and fallssecondary to acute metabolic encephalopathy/progressive dementiaof unknown etiology.  Symptoms most consistent with a functional neurological disorder. -Continue Sinemet trial 25-100 mg 3 times daily  Continue CIR 2. Antithrombotics: -DVT/anticoagulation:Lovenox -antiplatelet therapy: N/A 3. Pain Management:Tylenolas needed. Has headache at times  Controlled 10/27 4. Mood:Zoloft 50 mg daily, Valium2.5mg  q12 scheduled -pt sees psychiatry as outpt -antipsychotic agents: N/A  -continue melatonin to assist with sleep  -husband prefers not to add further medication for anxiety  -will need ongoing psych support at discharge  -10/27: Valium changed back to  2.5mg  q12H yesterday. Consider buspar, discussed with husband  5. Neuropsych: This patientis notcapable of making decisions on herown behalf-given neuro-behavioral issues. 6. Skin/Wound Care:Routine skin checks 7. Fluids/Electrolytes/Nutrition:encourage po    -Megace  800mg  bid per home regimen    -Marinol--need to be observant for cognitive side effects  -needs reassurance, cueing with meals   Ate little to nothing 10/25  -weights requested, prealbumin is 29.8, continuing to trend down, encourage po intake, husband's presence helps 8. Hyper thyroidism. Held Tapazole 5 mg 3 times daily per #9. continue Inderal 60 mg daily.   -pt will see endocrine as outpt  9. Elevated LFT's d/t tapazole  LFT's back to WNL  -tapazole, DC'd  10.  Hypokalemia  Potassium 4.2 10/25, down to 3.2 on 10/27. Increase K+ to 40 BID for two doses and repeat tomorrow.   Continue to monitor 11.  Leukocytosis  WBCs down to 9.5 10/25, 7.4 on 10/27. UC is on process, Rocephin started 10/26  Afebrile 12.  Dysphagia  D3 thins with full supervision, advance diet as tolerated  LOS: 23 days A FACE TO FACE EVALUATION WAS PERFORMED  Clide Deutscher Joaopedro Eschbach 12/02/2019, 12:48 PM

## 2019-12-02 NOTE — Progress Notes (Signed)
Speech Language Pathology Daily Session Note  Patient Details  Name: Hannah Padilla MRN: [id number] Date of Birth: 05-08-1950  Today's Date: 12/02/2019 SLP Individual Time: 1000-1035 SLP Individual Time Calculation (min): 35 min  Short Term Goals: Week 3: SLP Short Term Goal 1 (Week 3): Patient will demonstrate sustained attention in 5 minute intervals with max A verbal cues. SLP Short Term Goal 2 (Week 3): Patient will demonstrate orientation to place and time with Mod A multimodal cues. SLP Short Term Goal 3 (Week 3): Pt will demonstrate basic problem solving skills by using call bell to request assistance with max A multimodal cues. SLP Short Term Goal 4 (Week 3): Pt will consume dys 3 textures and thin liquid diet with minimal overt s/s aspiration and min A verbal cues for use of swallow strategies. SLP Short Term Goal 5 (Week 3): Pt will increase vocal intensity to 75% intelligibility at the sentence level with max A verbal cues.  Skilled Therapeutic Interventions:   Patient seen in room for ST session with husband not present. Patient very anxious, lying flat on back and speaking at only a whisper. Patient would close eyes when not interacting with SLP and would only respond to questions asked. She confirmed she would like water but then when SLP returned, she was not able to attend and stated she did not want to sit up in bed. Her spontaneous verbalizations were not related to anything going on and she would not elaborate when making statements without context. Patient started to close eyes and would not participate further. Patient continues to benefit from skilled SLP intervention to maximize cognitive-linguistic function prior to discharge.  Pain Pain Assessment Pain Scale: Faces Pain Score: 0-No pain  Therapy/Group: Individual Therapy  Sonia Baller, MA, CCC-SLP Speech Therapy

## 2019-12-02 NOTE — Progress Notes (Signed)
Physical Therapy Session Note  Patient Details  Name: Hannah Padilla MRN: [id number] Date of Birth: 1950-12-18  Today's Date: 12/02/2019 PT Individual Time: 6644-0347 PT Individual Time Calculation (min): 30 min   Short Term Goals: Week 3:  PT Short Term Goal 1 (Week 3): Patient will perform basic transfer with mod A using LRAD consistently. PT Short Term Goal 2 (Week 3): Patient will ambulate with 50 feet with mod A demonstrating improved foot clearance to reduce fall risk. PT Short Term Goal 3 (Week 3): Patient's husband will initiate hands on training with all mobility.  Skilled Therapeutic Interventions/Progress Updates:    Pt received sidelying in bed. Pt reports urge to have a BM. Sidelying to sitting EOB with max A for BLE management and trunk control with increased time, cues and encouragement to perform bed mobility. Sit to stand with max A to stedy from elevated bed due to posterior bias. Stedy transfer to elevated BSC over toilet. Pt is dependent for clothing management while standing in stedy. Pt unable to void once seated on toilet. Pt requesting to return to bed due to fatigue. Stedy transfer back to bed. Sit to supine mod A for BLE management. Pt left supine in bed with needs in reach, bed alarm in place with needs in reach.  Therapy Documentation Precautions:  Precautions Precautions: Fall Precaution Comments: anxious with mobility-resists posteriorly; tremors bilateral hands; watch skin integrity bilateral palms due to clenching fists intermittently Restrictions Weight Bearing Restrictions: No   Therapy/Group: Individual Therapy   Excell Seltzer, PT, DPT  12/02/2019, 12:29 PM

## 2019-12-02 NOTE — Progress Notes (Signed)
Occupational Therapy Weekly Progress Note  Patient Details  Name: Hannah Padilla MRN: [id number] Date of Birth: Apr 22, 1950   Today's Date: 12/02/2019 OT Individual Time: 6644-0347 OT Individual Time Calculation (min): 30 min    Skilled Therapeutic Interventions/Progress Updates:     Pt received in bed with pain in abdomen per LPN who just administered tylenol. P declined repositioning for pain relief. Pt very disorienting asking, "are all the sleeping pills gone. Purple. Help." Attempted to comfort and reorient patient. Offered gentle groomin activities at bed level and toileting, however pr contines to state, "please" and "im scared" but does not state anything else. Will come back at time pt more appropiate for tx and after time for tylenol to work into system.  Upon return pt with improved anxious behavior/pain as pt able to follow commands and perform mobility. Pt reporting need to toilet. Pt completes MAX A stand pivot transfer with posterior lean to transfer to [facility] at EOB. Pt able to turn head and scan in B directions today to locate [facility] and bed when cued. Pt requires total A for clothing management in standing. Pt sits EOB with S to sip water as well as wash face with tactile cues for initiation. Exited sesssion with pt seatedin bed, exit alarm on and call lightin reach   Therapy Documentation Precautions:  Precautions Precautions: Fall Precaution Comments: anxious with mobility-resists posteriorly; tremors bilateral hands; watch skin integrity bilateral palms due to clenching fists intermittently Restrictions Weight Bearing Restrictions: No General:   Vital Signs: Therapy Vitals Pulse Rate: (!) 52 Resp: 17 BP: (!) 108/58 Patient Position (if appropriate): Lying Oxygen Therapy SpO2: 100 % O2 Device: Room Air Pain:   ADL: ADL Grooming: Dependent Where Assessed-Grooming: Sitting at sink Toileting: Dependent Where Assessed-Toileting: Bed level Vision   Perception     Praxis   Exercises:   Other Treatments:     Therapy/Group: Individual Therapy  [name] 12/02/2019, 6:48 AM

## 2019-12-02 NOTE — Discharge Summary (Addendum)
Physician Discharge Summary  Patient ID: Hannah Padilla MRN: [id number] DOB/AGE: 1950-09-10 69 y.o.  Admit date: 11/09/2019 Discharge date: 12/04/2019  Discharge Diagnoses:  Principal Problem:   Progressive dementia with uncertain etiology ([place]) Active Problems:   DNR (do not resuscitate) discussion   Acute metabolic encephalopathy   Anxiety disorder due to general medical condition   Hypoalbuminemia due to protein-calorie malnutrition ([place])   Failure to thrive in adult   Palliative care by specialist   Protein-calorie malnutrition, severe DVT prophylaxis Hyperthyroidism E Coli UTI  Discharged Condition: Stable  Significant Diagnostic Studies: No results found.  Labs:  Basic Metabolic Panel: Recent Labs  Lab 11/30/19 1603 12/02/19 0627 12/03/19 1047  NA 139 140 138  K 4.2 3.8 3.7  CL 111 110 109  CO2 16* 16* 15*  GLUCOSE 94 82 78  BUN 22 17 15   CREATININE 0.73 0.78 0.82  CALCIUM 9.8 9.5 9.7    CBC: Recent Labs  Lab 11/30/19 1603 12/02/19 0627  WBC 9.5 7.4  HGB 15.8* 15.2*  HCT 48.2* 45.5  MCV 100.6* 99.8  PLT 334 313    CBG: No results for input(s): GLUCAP in the last 168 hours.  Family history.  Mother with Alzheimer's disease Father with COPD.  Negative for CAD diabetes mellitus or CVA  Brief HPI:   Hannah Padilla is a 69 y.o. right-handed female with history of hyperthyroidism maintained on Tapazole, recurrent UTIs [place] spotted fever.  Presented 10/20/2019 with progressive decline with noted apraxia over the last 3 months as well as intermittent panic attacks generalized weakness and falls.  Prior to onset of symptoms patient was healthy able to complete all ADLs without assistance.  Most recent MRI showed mild generalized cortical atrophy stable compared to scan 08/11/2019.  Scattered T2 FLAIR hyperintense foci predominantly in the subcortical and deep white matter of the hemispheres most consistent with chronic microvascular ischemic change.  No acute  findings.  EEG consistent with mild to moderate diffuse encephalopathy no seizure.  CT of the chest abdomen pelvis showed no evidence of primary metastatic disease.  Admission chemistries potassium 3.4 total protein 6.4 AST 49 ALT 50 hemoglobin 14.7 SARS current virus negative, TSH 0.851, free T4 1.96 T3 total 73 T4 total 10.5 ammonia level 17 sedimentation rate of 2, paraneoplastic CSF study negative.  Neurology follow-up suspect progressive dementia of unknown etiology.  She did receive 5 days of IV steroids during her hospital course as well as initiation of Sinemet for trial.  Subcutaneous Lovenox for DVT prophylaxis.  Thyroid function studies reviewed by endocrinology who recommended follow-up outpatient with ambulatory referral with recommendations to continue Tapazole as well as propranolol.  Therapy evaluations completed and patient was admitted for a comprehensive rehab program   Hospital Course: Hannah Padilla was admitted to rehab 11/09/2019 for inpatient therapies to consist of PT, ST and OT at least three hours five days a week. Past admission physiatrist, therapy team and rehab RN have worked together to provide customized collaborative inpatient rehab.  Pertaining to patient's impaired cognition apraxia with panic attacks generalized weakness and falls secondary to acute metabolic encephalopathy progressive dementia of unknown etiology.  She was followed by neurology services continued on trial of Sinemet.  Subcutaneous Lovenox for DVT prophylaxis.  Mood stabilization with Zoloft as well as Valium scheduled and adjusted as needed and she did have follow-up by neuropsychology.  In regards to patient's hyperthyroidism she had been on Tapazole as well as maintained on Inderal with plan to  follow-up outpatient endocrinology services Dr. Buddy Duty however her Tapazole was held due to elevated LFTs.  Patient with decreased nutritional storage trial of Megace dietary follow-up.  She was on a mechanical soft diet.   A urinalysis study completed 1025 showed positive nitrite many bacteria placed empirically on Rocephin with culture showing E. coli changed to Bactrim and discharged to home on 10 days antibiotics   Blood pressures were monitored on TID basis and controlled     Rehab course: During patient's stay in rehab weekly team conferences were held to monitor patient's progress, set goals and discuss barriers to discharge. At admission, patient required max assist ambulation moderate assist stand pivot transfers moderate assist supine to sit max assist sit to supine.  Max assist lower body bathing minimal assist grooming  Physical exam.  Blood pressure 141/97 pulse 87 temperature 98.4 respirations 18 oxygen saturation 96% room air Constitutional.  No acute distress HEENT Head.  Normocephalic and atraumatic Eyes.  Pupils round and reactive to light no discharge without nystagmus Neck.  Supple nontender no JVD without thyromegaly Cardiac regular rate rhythm without extra sounds or murmur heard Abdomen.  Soft nontender positive bowel sounds without rebound Respiratory effort normal no respiratory distress without wheeze Neurologic.  Alert tends to stare forward and less cued.  Pill-rolling tremor right hand keeps left hand clenched.  Can move right upper extremity with cueing and extra time.  Seems to move legs more freely but needs cues also.  DTRs 2+ decreased attention initiation speech was a bit dysarthric Insight fair but limited.  Anxiety seemed to feel lack of attention and tremors.  Motor exam difficult to assess to inconsistent effort.  She does withdrawal to deep stimuli  Hannah Padilla  has had improvement in activity tolerance, balance, postural control as well as ability to compensate for deficits. Hannah Padilla has had improvement in functional use RUE/LUE  and RLE/LLE as well as improvement in awareness.  Aggressive family teaching ongoing during her rehabilitation stay.  Performing bed mobility with use of  hospital bed features to reduce caregiver burden transfers gait short distances 25 feet rolling walker.  She did need some encouragement at times to participate.  Continuing to work with [place].  She did need some increased time to complete sessions limiting stimuli.  Completed amatory transfers with ADLs with patient and to the bathroom with cueing.  Required max assist for toileting tasks.  Full family teaching completed plan discharge to home       Disposition: Discharged to home    Diet: Soft  Special Instructions: No driving smoking or alcohol  Ambulatory referral obtained to outpatient endocrinology services    Medications at discharge 1.  Tylenol as needed 2.  Valium 2.5 mg every 12 hours 3.  Melatonin 3 mg nightly 4.  MiraLAX daily hold for loose stools 5.  Inderal 60 mg p.o. daily 6.  Zoloft 100 mg p.o. daily 7.  Vitamin D 50,000 units every Tuesday 8.  Sinemet 25-100 mg 1 tablet 3 times daily 9.Kdur 20 meq daily 10.  Bactrim 1 p.o. twice daily x10 days  30-35 minutes were spent completing discharge summary and discharge planning  Discharge Instructions    Ambulatory referral to Neurology   Complete by: As directed    An appointment is requested in approximately 4 weeks acute metabolic encephalopathy   Ambulatory referral to Physical Medicine Rehab   Complete by: As directed    Moderate complexity follow up 1-2 weeks acute metabolic encephalopathy with progressive dementia  Follow-up Information    Delrae Rend, MD Follow up.   Specialty: Endocrinology Why: Call for appointment to follow-up for hyperthyroidism Contact information: [address] [place] [address] [phone]        Izora Ribas, MD Follow up.   Specialty: Physical Medicine and Rehabilitation Why: Office to call for appointment Contact information: [address] [place] [address] [phone]                Signed: Cathlyn Parsons 12/04/2019, 5:19 AM

## 2019-12-02 NOTE — Consult Note (Signed)
Neuropsychological Consultation   Patient:   Hannah Padilla   DOB:   Jan 23, 1951  MR Number:  [id number]  Location:  [place] [id number] [place] [address] Dept: [phone] Loc: [phone]           Date of Service:   12/02/2019  Start Time:   2 PM End Time:   2:30  PM  Provider/Observer:  Ilean Skill, Psy.D.       Clinical Neuropsychologist       Billing Code/Service: 01751  Chief Complaint:    Hannah Padilla is a 69 year old female with history of hypothyroidism maintained on Tapazole.  Recurrent UTIs, [facility] spotted fever.  Patient is reported to have been functioning normally until early June 2021.  However, there is back medical records that suggest she had had some difficulty with panic events and anxiety going back to 2015 seen in her EMR with a visit to [place] Bode, [place] in which the patient had been describing "shakes."  Panic and anxiety were noted.  Husband reported that these episodes typically happen on Mondays and that she is terrified of public speaking and gets very anxious when she is the subject of attention.  These difficulties went back to her school years.  In June 2021 the patient went to the emergency room with a panic attack and had 2 admissions in July with altered mental status and severe anxiety.  MRI was unremarkable.  Patient was tested for Lyme disease and [facility] spotted fever and patient was found to be positive for [facility] spotted fever and treated with antibiotics.  However, there was noted significant cognitive decline and increase in anxiety.  Patient developed shuffling gait, tremor with right hand, masking of face and significant problems with ambulation.  Patient did not initiate conversations or activities.  She deteriorated point that she required assist to standing up and required assistance with bathing and dressing and feeding.  She  developed incontinence of bowel and bladder.  There were no overt hallucinations.  Patient had a prior.  With hallucinations during a UTI infection.  There have been developing short-term memory issues.  There was rigidity in her arms and legs.  Before the development of these current difficulties the patient was healthy and able to complete her ADLs without assistance.  Recent MRI showed mild generalized cortical atrophy and scattered T2 flair hyperintensity foci predominantly in the subcortical and deep white matter of the hemispheres most consistent with chronic microvascular ischemic changes.  No acute abnormalities were noted.  EEG consistent with mild to moderate diffuse encephalopathy.  No seizures noted.  Other than less crying today, there was very little change in status noted today.  Tremulous most of times but brief times with complete calm in tremors.  Patient's voice is so soft and unsteady very hard to hear what she is saying and patient not speaking more than absolute minimum.    Reason for Service:  Patient was referred for neuropsychological consultation due to anxeity subsequent to general medical status.  Below is the HPI for the current admission.  [facility]:ENIDPOEU [name] is a 69 year old right-handed female with history of hyperthyroidism maintained on Tapazole, recurrent UTIs, [place] spotted fever. Presented 10/20/2019 with progressive decline with noted apraxia over the last 3 months as well as intermittent panic attacks generalized weakness and falls. Prior to onset of symptoms patient was healthy able to complete all of her  ADLs without assistance. Most recent MRI showed mild generalized cortical atrophy stable compared to scan of 08/11/2019. Scattered T2 FLAIR hyperintense foci predominantly in the subcortical and deep white matter of the hemispheres most consistent with chronic microvascular ischemic change. No acute findings. EEG consistent with mild to moderate diffuse  encephalopathy. No seizure. CT of the chest abdomen pelvis showed no evidence of primary metastatic disease. Admission chemistries potassium 3.4 total protein 6.4 AST 49 ALT 50 hemoglobin 14.7 SARS coronavirus negative, TSH 0.851, free T4 1.96, T3 total 73, T4 total 10.5 ammonia level 17, ESR of 2, paraneoplastic CSF study negative. Neurology follow-up suspect progressive dementia of unknown etiology. She did receive 5 days of IV steroids during her hospital course as well as initiation of Sinemet for trial. She is on a mechanical soft diet thin liquids. Subcutaneous Lovenox for DVT prophylaxis. Thyroid function studies were reviewed by endocrinology Dr. Buddy Duty who recommends follow-up outpatient with ambulatory referral with recommendations to continue Tapazole as well as propranolol. Therapy evaluations completed and patient was admitted for a comprehensive rehab program.  Current Status:  Patient was alone today without husband present.  She was awake and alert.  Mental status being significantly degraded due to anxiety.  She was trembling in arms and hands at times then others would completely stop.  Voice was very soft and low volume.  Voice control impaired.  Limited expressive language and only brief answers to direct questions and no self directed speech other than to talk about anxiety/fear in evening when alone.   Patient reported "stinging eyes"  Patient indicated that she has never had symptoms like this before and while she has had anxiety, she has never been like this before.  She asked about RMSF.    Behavioral Observation: Hannah Padilla  presents as a 69 y.o.-year-old Right Caucasian Female who appeared her stated age. her dress was Appropriate and she was Well Groomed and her manners were Appropriate to the situation.  her participation was indicative of Redirectable behaviors.  There werephysical disabilities noted.  she displayed an appropriate level of cooperation and motivation.      Interactions:    Active Inattentive and Redirectable  Attention:   abnormal and attention span appeared shorter than expected for age  Memory:   abnormal; global memory impairment noted  Visuo-spatial:  not examined  Speech (Volume):  low  Speech:   normal; slowed speech  Thought Process:  Circumstantial  Though Content:  WNL; not suicidal and not homicidal  Orientation:   person and place  Judgment:   Poor  Planning:   Poor  Affect:    Anxious  Mood:    Anxious  Insight:   Shallow  Intelligence:   normal  Medical History:   Past Medical History:  Diagnosis Date  . Anxiety   . Colon polyp 2015  . Elevated serum free T4 level    husband report  . Farsightedness    wears glasses  . Mood change 2021   major change in affect, health issues with multiple ED visits, hospitalization, starting 07/10/2019  . RMSF [facility] spotted fever) 2021  . Urinary tract infection        Psychiatric History:  Patient does have a history of significant anxiety and panic events with shaking prior to June of this year.  However, significant exacerbation in her neurological status has developed and progressed.  Family Med/Psych History:  Family History  Problem Relation Age of Onset  . Alzheimer's disease Mother   .  Other Father        cognitive decline  . COPD Father        smoker  . Protein S deficiency Other   . Heart disease Neg Hx   . Diabetes Neg Hx   . Cancer Neg Hx   . Stroke Neg Hx   . Hypertension Neg Hx   . Hyperlipidemia Neg Hx    Impression/DX:  Hannah Padilla is a 69 year old female with history of hypothyroidism maintained on Tapazole.  Recurrent UTIs, [facility] spotted fever.  Patient is reported to have been functioning normally until early June 2021.  However, there is back medical records that suggest she had had some difficulty with panic events and anxiety going back to 2015 seen in her EMR with a visit to [place]ode, [place] in which the patient  had been describing "shakes."  Panic and anxiety were noted.  Husband reported that these episodes typically happen on Mondays and that she is terrified of public speaking and gets very anxious when she is the subject of attention.  These difficulties went back to her school years.  In June 2021 the patient went to the emergency room with a panic attack and had 2 admissions in July with altered mental status and severe anxiety.  MRI was unremarkable.  Patient was tested for Lyme disease and [facility] spotted fever and patient was found to be positive for [facility] spotted fever and treated with antibiotics.  However, there was noted significant cognitive decline and increase in anxiety.  Patient developed shuffling gait, tremor with right hand, masking of face and significant problems with ambulation.  Patient did not initiate conversations or activities.  She deteriorated point that she required assist to standing up and required assistance with bathing and dressing and feeding.  She developed incontinence of bowel and bladder.  There were no overt hallucinations.  Patient had a prior.  With hallucinations during a UTI infection.  There have been developing short-term memory issues.  There was rigidity in her arms and legs.  Before the development of these current difficulties the patient was healthy and able to complete her ADLs without assistance.  Recent MRI showed mild generalized cortical atrophy and scattered T2 flair hyperintensity foci predominantly in the subcortical and deep white matter of the hemispheres most consistent with chronic microvascular ischemic changes.  No acute abnormalities were noted.  EEG consistent with mild to moderate diffuse encephalopathy.  No seizures noted.  Other than less crying today, there was very little change in status noted today.  Tremulous most of times but brief times with complete calm in tremors.  Patient's voice is so soft and unsteady very hard to hear what  she is saying and patient not speaking more than absolute minimum.     Disposition/Plan:  As far as diagnostic considerations, this does appear to be a multifactorial condition likely.  The patient does have a prior history of panic events, tremors and shakes during anxiety and significant anxiety symptoms particularly during social challenge and interaction.  The patient has been progressively worsening.  The patient was diagnosed with [facility] spotted fever as well as UTIs and has had significant neurological responses with prior UTIs including developing hallucinations and deteriorations.  The patient's husband reports that the patient's mother also had significant hallucinations and difficulties during UTI infections in the past.  The patient's MRI shows some age-related small vessel disease.  Cortical atrophy but no acute changes.  Patient clearly deteriorated around  the time of her [facility] spotted fever diagnosis and this bacterial infection and the neurological worsening and deficits clearly developed and worsened around this infection.  Her symptoms are consistent with [facility] spotted fever neurological involvement but these pre-existing functional neurological deficits and changes in conjunction with vulnerabilities historically likely are playing a role in the severity of her symptoms.  There is likely some degree of functional neurological disorder contributing to her severity of symptoms but clearly [facility] spotted fever has a significant component to the degree and severity of her illness with anxiety and underlying central nervous system reactivity to bacterial infection in general is present.  Further neuropsychology visits not likely to be helpful for patient.    Diagnosis:    Metabolic encephalopathy - Plan: Ambulatory referral to Neurology, Ambulatory referral to Physical Medicine Rehab         Electronically Signed   _______________________ Ilean Skill,  Psy.D.

## 2019-12-02 NOTE — Progress Notes (Signed)
Physical Therapy Session Note  Patient Details  Name: Hannah Padilla MRN: [id number] Date of Birth: 12/02/1950  Today's Date: 12/02/2019 PT Individual Time: 1856-3149 PT Individual Time Calculation (min): 18 min   Short Term Goals: Week 3:  PT Short Term Goal 1 (Week 3): Patient will perform basic transfer with mod A using LRAD consistently. PT Short Term Goal 2 (Week 3): Patient will ambulate with 50 feet with mod A demonstrating improved foot clearance to reduce fall risk. PT Short Term Goal 3 (Week 3): Patient's husband will initiate hands on training with all mobility.  Skilled Therapeutic Interventions/Progress Updates:     Patient in bed with RN in the room upon PT arrival. Patient appeared very anxious. She reported 10 people were in her to "stick" her, referring to her IV line placement and recent blood draw. Noted patient bleeding from her IV site, RN made aware. Patient initially stated that she needed to go to the bathroom, however, when attempting to initiate mobility to sit EOB, patient stated, "I changed my mind." Patient, again appeared very anxious and strongly declined getting OOB. Guided patient through diaphragmatic breathing and repositioned her in the bed, performing scooting up in the bed with max A with patient following cues for using her legs to push up in the bed. Continued diaphragmatic breathing. Provided education on benefits of OOB mobility and patient's progress. Patient stated, "I don't want to walk, I want to be left alone." PT exited patient's room x10 min with the patient in the bed with breaks on, bed alarm set, and soft touch call bell in reach.   Returned after 10 min, patient sleeping calmly in the bed. Per RN, patient has been very anxious today and continues to have hallucinations. Appreciative that patient is able to rest. Patient missed 42 min of skilled PT due to anxiety/fatigue, RN made aware. Will attempt to make-up missed time as able.     Therapy  Documentation Precautions:  Precautions Precautions: Fall Precaution Comments: anxious with mobility-resists posteriorly; tremors bilateral hands; watch skin integrity bilateral palms due to clenching fists intermittently Restrictions Weight Bearing Restrictions: No   Therapy/Group: Individual Therapy  Leanny Moeckel L Henry Utsey PT, DPT  12/02/2019, 1:28 PM

## 2019-12-03 ENCOUNTER — Inpatient Hospital Stay (HOSPITAL_COMMUNITY): Payer: Medicare Other

## 2019-12-03 ENCOUNTER — Inpatient Hospital Stay (HOSPITAL_COMMUNITY): Payer: Medicare Other | Admitting: Speech Pathology

## 2019-12-03 LAB — BASIC METABOLIC PANEL
Anion gap: 14 (ref 5–15)
BUN: 15 mg/dL (ref 8–23)
CO2: 15 mmol/L — ABNORMAL LOW (ref 22–32)
Calcium: 9.7 mg/dL (ref 8.9–10.3)
Chloride: 109 mmol/L (ref 98–111)
Creatinine, Ser: 0.82 mg/dL (ref 0.44–1.00)
GFR, Estimated: >60 mL/min (ref >60)
Glucose, Bld: 78 mg/dL (ref 70–99)
Potassium: 3.7 mmol/L (ref 3.5–5.1)
Sodium: 138 mmol/L (ref 135–145)

## 2019-12-03 MED ORDER — MELATONIN 3 MG PO TABS: 3.0000 mg | ORAL_TABLET | Freq: Every day | ORAL | 0 refills | Status: DC

## 2019-12-03 MED ORDER — PROPRANOLOL HCL ER 60 MG PO CP24: 60.0000 mg | ORAL_CAPSULE | Freq: Every day | ORAL | 1 refills | Status: DC

## 2019-12-03 MED ORDER — DIAZEPAM 5 MG PO TABS: 2.5000 mg | ORAL_TABLET | Freq: Two times a day (BID) | ORAL | 0 refills | Status: DC

## 2019-12-03 MED ORDER — OMEPRAZOLE 40 MG PO CPDR: 40.0000 mg | DELAYED_RELEASE_CAPSULE | Freq: Every day | ORAL | 0 refills | Status: DC

## 2019-12-03 MED ORDER — VITAMIN D (ERGOCALCIFEROL) 1.25 MG (50000 UNIT) PO CAPS: 50000.0000 [IU] | ORAL_CAPSULE | ORAL | 1 refills | Status: DC

## 2019-12-03 MED ORDER — CARBIDOPA-LEVODOPA 25-100 MG PO TABS: 1.0000 | ORAL_TABLET | Freq: Three times a day (TID) | ORAL | 0 refills | Status: DC

## 2019-12-03 MED ORDER — SERTRALINE HCL 100 MG PO TABS: 100.0000 mg | ORAL_TABLET | Freq: Every day | ORAL | 0 refills | Status: DC

## 2019-12-03 NOTE — Progress Notes (Signed)
[facility] PHYSICAL MEDICINE & REHABILITATION PROGRESS NOTE   Subjective/Complaints: Pt up in chair with OT. Actually had a small smile and appears to be much less anxious today.   ROS: Limited due to cognitive/behavioral     Objective:   No results found. Recent Labs    11/30/19 1603 12/02/19 0627  WBC 9.5 7.4  HGB 15.8* 15.2*  HCT 48.2* 45.5  PLT 334 313   Recent Labs    11/30/19 1603 12/02/19 0627  NA 139 140  K 4.2 3.8  CL 111 110  CO2 16* 16*  GLUCOSE 94 82  BUN 22 17  CREATININE 0.73 0.78  CALCIUM 9.8 9.5    Intake/Output Summary (Last 24 hours) at 12/03/2019 1131 Last data filed at 12/02/2019 1800 Gross per 24 hour  Intake 765.04 ml  Output --  Net 765.04 ml    Physical Exam: Vital Signs Blood pressure 123/83, pulse 64, temperature 99.1 F (37.3 C), resp. rate 17, height 5\' 7"  (1.702 m), weight 55.4 kg, SpO2 100 %.  Constitutional: No distress . Vital signs reviewed. HEENT: EOMI, oral membranes moist Neck: supple Cardiovascular: RRR without murmur. No JVD    Respiratory/Chest: CTA Bilaterally without wheezes or rales. Normal effort    GI/Abdomen: BS +, non-tender, non-distended Ext: no clubbing, cyanosis, or edema Psych: baseline anxiety but improved today. Better concentration Skin: Clean and intact without signs of breakdown  Musc: No edema in extremities.  No tenderness in extremities. Neuro: makes better eye contact. Forming sentences and asked a pertinent question. Indicated that she needed to go to bathroom.  Moves all 4's. Good sitting balance   Assessment/Plan: 1. Functional deficits secondary to acute, progressive dementia which require 3+ hours per day of interdisciplinary therapy in a comprehensive inpatient rehab setting.  Physiatrist is providing close team supervision and 24 hour management of active medical problems listed below.  Physiatrist and rehab team continue to assess barriers to discharge/monitor patient progress toward  functional and medical goals  Care Tool:  Bathing    Body parts bathed by patient: Chest, Abdomen, Left arm, Right arm   Body parts bathed by helper: Front perineal area, Buttocks     Bathing assist Assist Level: Maximal Assistance - Patient 24 - 49%     Upper Body Dressing/Undressing Upper body dressing   What is the patient wearing?: Pull over shirt    Upper body assist Assist Level: Maximal Assistance - Patient 25 - 49%    Lower Body Dressing/Undressing Lower body dressing      What is the patient wearing?: Underwear/pull up, Pants     Lower body assist Assist for lower body dressing: Total Assistance - Patient < 25%     Toileting Toileting    Toileting assist Assist for toileting: Total Assistance - Patient < 25%     Transfers Chair/bed transfer  Transfers assist     Chair/bed transfer assist level: Dependent - mechanical lift Chair/bed transfer assistive device: Programmer, multimedia   Ambulation assist   Ambulation activity did not occur: Safety/medical concerns (limited by anxiety and decreased strength/balance)  Assist level: Minimal Assistance - Patient > 75% Assistive device: Walker-rolling Max distance: 308 ft   Walk 10 feet activity   Assist  Walk 10 feet activity did not occur: Safety/medical concerns  Assist level: Minimal Assistance - Patient > 75% Assistive device: Walker-rolling   Walk 50 feet activity   Assist Walk 50 feet with 2 turns activity did not occur: Safety/medical concerns (single  turn/39ft)  Assist level: Minimal Assistance - Patient > 75% Assistive device: Walker-rolling    Walk 150 feet activity   Assist Walk 150 feet activity did not occur: Safety/medical concerns  Assist level: Minimal Assistance - Patient > 75% Assistive device: Walker-rolling    Walk 10 feet on uneven surface  activity   Assist Walk 10 feet on uneven surfaces activity did not occur: Safety/medical concerns          Wheelchair     Assist Will patient use wheelchair at discharge?: No (not anticipated per PT long term goals)   Wheelchair activity did not occur: Safety/medical concerns (limited by decreased cognition and anxiety)         Wheelchair 50 feet with 2 turns activity    Assist    Wheelchair 50 feet with 2 turns activity did not occur: Safety/medical concerns       Wheelchair 150 feet activity     Assist  Wheelchair 150 feet activity did not occur: Safety/medical concerns   Assist Level: Maximal Assistance - Patient 25 - 49%   Blood pressure 123/83, pulse 64, temperature 99.1 F (37.3 C), resp. rate 17, height 5\' 7"  (1.702 m), weight 55.4 kg, SpO2 100 %.  Medical Problem List and Plan: 1.Progressive decrease in functional mobility, impaired cognition, apraxia with panic attacks generalized weakness and fallssecondary to acute metabolic encephalopathy/progressive dementiaof unknown etiology.  Symptoms most consistent with a functional neurological disorder. -Continue Sinemet trial 25-100 mg 3 times daily  Continue CIR--still targeting dc 10/29,. Family ed/planning ongoing 2. Antithrombotics: -DVT/anticoagulation:Lovenox -antiplatelet therapy: N/A 3. Pain Management:Tylenolas needed. Has headache at times  Controlled 10/28 4. Mood:Zoloft 50 mg daily, Valium2.5mg  q12 scheduled -pt sees psychiatry as outpt -antipsychotic agents: N/A  -continue melatonin to assist with sleep  -husband prefers not to add further medication for anxiety  -will need ongoing psych support at discharge  -10/28 improved with rx of UTI  5. Neuropsych: This patientis notcapable of making decisions on herown behalf-given neuro-behavioral issues. 6. Skin/Wound Care:Routine skin checks 7. Fluids/Electrolytes/Nutrition:encourage po    -Megace  800mg  bid per home regimen    -Marinol--need to be observant for cognitive  side effects  -needs reassurance, cueing with meals   Ate little to nothing 10/25  -prealbumin about where it's been. Has lost weight overall from this entire ordeal. Husband and family are aware of importance of her nutrition  -today's weight sl improved Filed Weights   11/10/19 1400 12/01/19 1100 12/02/19 0500  Weight: 57 kg 54.9 kg 55.4 kg   8. Hyper thyroidism. Held Tapazole 5 mg 3 times daily per #9. continue Inderal 60 mg daily.   -pt will see endocrine as outpt  9. Elevated LFT's d/t tapazole  LFT's back to WNL  -tapazole, DC'd  10.  Hypokalemia  Potassium 4.2 10/25, down to 3.2 on 10/27. Increase K+ to 40 BID for two doses and repeat tomorrow.   Continue to monitor 11.  E Coli UTI  -has received at least 2 doses of rocephin. Cognition/anxiety has improved. Await sensitivies---transition to PO at discharge 12.  Dysphagia  D3 thins with full supervision, advance diet as tolerated  LOS: 24 days A FACE TO FACE EVALUATION WAS PERFORMED  Meredith Staggers 12/03/2019, 11:31 AM

## 2019-12-03 NOTE — Progress Notes (Signed)
Physical Therapy Discharge Summary  Patient Details  Name: Hannah Padilla MRN: [id number] Date of Birth: February 15, 1950  Today's Date: 12/03/2019 PT Individual Time: 1430-1440 and 1450-1545 PT Individual Time Calculation (min): 10 min and 55 min    Patient has met 6 of 9 long term goals due to improved activity tolerance, improved balance, improved postural control, increased strength, increased range of motion, ability to compensate for deficits, improved attention, improved awareness and improved coordination.  Patient to discharge at an ambulatory level Mod Assist and dependent w/c level when ambulation is unsafe due to anxiety and decreased cognition. Patient's care partner is independent to provide the necessary physical and cognitive assistance at discharge.  Reasons goals not met: Patient continues to require max A for attention and awareness during functional mobility due to cognitive deficits and increased anxiety. Patient's husband has demonstrated that he can facilitate this level of assist successfully with the patient. Patient required mod A to enter a car and max +2 to exit a car due to increased anxiety in a highly stimulating environment. Recommending having a second person on standby at d/c. Patient's husband in agreement.  Recommendation:  Patient will benefit from ongoing skilled PT services in home health setting to continue to advance safe functional mobility, address ongoing impairments in balance, strength, functional mobility, gait and stair training, activity tolerance, motor planning, initiation, memory, patient/caregiver education, and minimize fall risk.  Equipment: RW, hospital bed, transport chair  Reasons for discharge: treatment goals met  Patient/family agrees with progress made and goals achieved: Yes  Skilled Therapeutic Interventions:  Session 1 &2: Patient in bed with her friend Izora Gala upon PT arrival. Patient confused and anxious with mild agitation upon PT  arrival. Trialed multiple strategies to reduce patient's anxiety. Unable to redirect or calm patient. Provided patient a 10 min break with lights and TV off. Returned and patient much more calm with only mild anxiety and able to initiate mobility with patient.   Therapeutic Activity: Bed Mobility: Patient performed supine to/from sit with mod-min A for initiation and trunk and B leg managment. Provided verbal cues for sequencing and initiation. Transfers: Patient performed sit to/from stand from the bed, BSC over the toilet, and the w/c with mod A using the RW. Provided verbal cues for forward weight shift and forward trunk lean to stand and increased hip extension in standing. Patient was continent of bladder during toileting, performed peri-care and LB clothing management with total A. Patient performed a simulated low SUV height car transfer with mod A for entry and max +2 to exit the car simulator due to increased anxiety with increased external stimulation using RW. Provided cues for safe technique and initiation.  Gait Training:  Patient ambulated 15 feet x2 using RW with mod-min A. Ambulated as described below. Provided verbal cues for increased step height and length, increased hip extension, and facilitated forward propulsion with RW throughout for improved initiation and step length.  Wheelchair Mobility:  Patient was transported in the w/c with total A throughout session for energy conservation and time management.  Patient in bed with her friend at bedside at end of session with breaks locked, bed alarm set, and all needs within reach.   PT Discharge Precautions/Restrictions Precautions Precautions: Fall Precaution Comments: significant anxiety and fear of falling limiting motor planning Restrictions Weight Bearing Restrictions: No Vision/Perception  Vision - Assessment Eye Alignment: Within Functional Limits Ocular Range of Motion: Other (comment) (maintains midline unless  cued) Alignment/Gaze Preference: Other (comment) (midline) Tracking/Visual Pursuits:  Decreased smoothness of horizontal tracking;Decreased smoothness of vertical tracking;Decreased smoothness of eye movement to RIGHT superior field;Decreased smoothness of eye movement to LEFT superior field;Decreased smoothness of eye movement to RIGHT inferior field;Decreased smoothness of eye movement to LEFT inferior field;Requires cues, head turns, or add eye shifts to track Saccades: Undershoots Perception Inattention/Neglect: Other (comment) Comments: poor initiation and cues for all mobility and visual scanning Praxis Praxis: Impaired Praxis Impairment Details: Initiation;Motor planning;Ideation;Ideomotor  Cognition Overall Cognitive Status: Impaired/Different from baseline Arousal/Alertness: Awake/alert Focused Attention: Impaired Sustained Attention: Impaired Selective Attention: Impaired Memory: Impaired Awareness: Impaired Problem Solving: Impaired Reasoning: Impaired Sequencing: Impaired Decision Making: Impaired Initiating: Impaired Safety/Judgment: Impaired Sensation Sensation Light Touch: Impaired by gross assessment Proprioception: Impaired by gross assessment Additional Comments: difficult to fully assess due to impaired cognition Coordination Gross Motor Movements are Fluid and Coordinated: No Fine Motor Movements are Fluid and Coordinated: No Coordination and Movement Description: tremors bilateral UE, decreased initiation, resists mobility due to significant anxiety Finger Nose Finger Test: unable to follow commands to complete Heel Shin Test: unable to follow commands to complete Motor  Motor Motor: Motor apraxia;Abnormal postural alignment and control Motor - Discharge Observations: improved midline orientation anterior/posterior in sitting and standing compared to eval, continues to have posterior bias  Mobility Bed Mobility Rolling Right: Minimal Assistance - Patient >  75% (with bed rail) Rolling Left: Minimal Assistance - Patient > 75% (with bed rail) Supine to Sit: Moderate Assistance - Patient 50-74%;Minimal Assistance - Patient > 75% Sit to Supine: Moderate Assistance - Patient 50-74%;Minimal Assistance - Patient > 75% Transfers Sit to Stand: Minimal Assistance - Patient > 75%;Moderate Assistance - Patient 50-74% Stand to Sit: Minimal Assistance - Patient > 75%;Moderate Assistance - Patient 50-74% Stand Pivot Transfers: Minimal Assistance - Patient > 75%;Moderate Assistance - Patient 50 - 74% Stand Pivot Transfer Details: Tactile cues for initiation;Tactile cues for sequencing;Tactile cues for posture;Verbal cues for sequencing;Verbal cues for precautions/safety;Manual facilitation for placement;Manual facilitation for weight shifting;Verbal cues for technique Stand Pivot Transfer Details (indicate cue type and reason): Patient variable in performance Transfer (Assistive device): Rolling walker (B hands on RW to stand with assist for bracing device to promote forward weight shift) Locomotion  Gait Ambulation: Yes Gait Assistance: Moderate Assistance - Patient 50-74%;Minimal Assistance - Patient > 75% Gait Distance (Feet): 308 Feet (max distance, average 50 ft) Assistive device: Rolling walker Gait Assistance Details: Manual facilitation for weight shifting;Verbal cues for safe use of DME/AE;Manual facilitation for placement;Tactile cues for weight shifting;Verbal cues for technique;Tactile cues for posture Gait Gait: Yes Gait Pattern: Decreased stride length;Decreased hip/knee flexion - left;Decreased dorsiflexion - right;Decreased weight shift to right;Decreased dorsiflexion - left;Shuffle;Right foot flat;Left foot flat;Trunk flexed;Poor foot clearance - left;Poor foot clearance - right;Narrow base of support Gait velocity: significantly decreased, posterior bias throughout gait Stairs / Additional Locomotion Stairs: Yes Stairs Assistance: Maximal  Assistance - Patient 25 - 49% (very skilled max A with manual facilitation for hand and foot placement) Stair Management Technique: Two rails Number of Stairs: 4 Height of Stairs: 6  Trunk/Postural Assessment  Cervical Assessment Cervical Assessment: Exceptions to [facility] (forward head and rounded shoulders) Thoracic Assessment Thoracic Assessment: Exceptions to [facility] (kyphosis) Lumbar Assessment Lumbar Assessment: Exceptions to [facility] (posterior pelvic tilt) Postural Control Postural Control: Deficits on evaluation (posteriorly pushing)  Balance Static Sitting Balance Static Sitting - Level of Assistance: 5: Stand by assistance Dynamic Sitting Balance Dynamic Sitting - Level of Assistance: 5: Stand by assistance Static Standing Balance Static Standing - Level of Assistance:  4: Min assist Dynamic Standing Balance Dynamic Standing - Level of Assistance: 3: Mod assist Extremity Assessment  RLE Assessment RLE Assessment: Exceptions to [facility] Active Range of Motion (AROM) Comments: Grossly WFL for functional mobility General Strength Comments: Grossly at least 3+/5, unable to follow commands for formal testing due to cognitive deficits and anxiety LLE Assessment LLE Assessment: Exceptions to [facility] Active Range of Motion (AROM) Comments: Grossly WFL for functional mobility General Strength Comments: Grossly at least 3+/5, unable to follow commands for formal testing due to cognitive deficits and anxiety    Jayceion Lisenby L Tehani Mersman PT, DPT  12/03/2019, 4:15 PM

## 2019-12-03 NOTE — Progress Notes (Signed)
SLP Cancellation Note  Patient Details Name: Hannah Padilla MRN: [id number] DOB: 1950/06/22   Cancelled treatment:        Patient receiving nursing care for toileting and missed 30 minute SLP session                                                                                              Sonia Baller, MA, CCC-SLP Speech Therapy

## 2019-12-03 NOTE — Progress Notes (Signed)
Occupational Therapy Discharge Summary  Patient Details  Name: Hannah Padilla MRN: [id number] Date of Birth: 07/04/1950  Today's Date: 12/03/2019 OT Individual Time: 0800-0900 OT Individual Time Calculation (min): 60 min    Pt received in bed agreeable to OT with improved command follow ing during previous session. Pt completes ambulatory trasnfers with RW and MOD A to correct posteiror lean and manage RW into bathroom to transfer onto TTB in shower. Pt friend arrives and participates in training/hands on A for functional transfers to/from commode. EDU RE body mechanics, safety awareness, DME safety, grab bar/bed rail use, handling techniques as well as concise multimodal cuing/increased processing time. Friend participates in transfer BSC>TTB with min VC for technique/cuing pt. Pt requires MAX A to don shirt and pants at sit to stand level at sink. Pt on/off BSC throughotu session reporting need to toilet, however does not void over 3 trials. Exited session with pt seated in bed, exit alarm on and call light in reach   Patient has met 8 of 11 long term goals due to improved activity tolerance, improved balance, postural control and improved attention.  Patient to discharge at [facility] Max Assist level.  Patient's care partner is independent to provide the necessary physical and cognitive assistance at discharge.    Reasons goals not met: Pt continues to require max-total A for dressing, and eating. Husband is able ot provide this level of A at home  Recommendation:  Patient will benefit from ongoing skilled OT services in home health setting to continue to advance functional skills in the area of BADL and iADL.  Equipment: BSC  Reasons for discharge: treatment goals met and discharge from hospital  Patient/family agrees with progress made and goals achieved: Yes  OT Discharge Precautions/Restrictions  Precautions Precautions: Fall General   Vital Signs Therapy Vitals Temp: 99.1 F  (37.3 C) Pulse Rate: 64 BP: 123/83 Patient Position (if appropriate): Lying Oxygen Therapy SpO2: 100 % O2 Device: Room Air Pain   ADL ADL Eating: Maximal assistance Where Assessed-Eating: Bed level Grooming: Minimal assistance Where Assessed-Grooming: Sitting at sink Upper Body Bathing: Moderate assistance Where Assessed-Upper Body Bathing: Shower Lower Body Bathing: Moderate assistance Where Assessed-Lower Body Bathing: Shower Upper Body Dressing: Maximal assistance Where Assessed-Upper Body Dressing: Wheelchair Lower Body Dressing: Maximal assistance Where Assessed-Lower Body Dressing: Sitting at sink Toileting: Maximal assistance Where Assessed-Toileting: Bedside Commode Toilet Transfer: Moderate assistance, Minimal assistance Toilet Transfer Method: Ambulating, Stand pivot Toilet Transfer Equipment: Geophysical data processor: Minimal assistance, Moderate assistance Social research officer, government Method: Stand pivot Youth worker: Gaffer Baseline Vision/History: Wears glasses Wears Glasses: At all times Patient Visual Report: Other (comment) Eye Alignment: Within Functional Limits Alignment/Gaze Preference: Other (comment) Saccades: Undershoots;Decreased speed of saccadic movement Perception  Perception: Impaired Comments: does not scan funcitonally unless cued. limited by cognition Praxis Praxis: Impaired Praxis Impairment Details: Motor planning;Ideation;Initiation;Ideomotor Cognition Overall Cognitive Status: Impaired/Different from baseline Arousal/Alertness: Awake/alert Focused Attention: Impaired Sustained Attention: Impaired Selective Attention: Impaired Memory: Impaired Awareness: Impaired Problem Solving: Impaired Reasoning: Impaired Sequencing: Impaired Decision Making: Impaired Initiating: Impaired Safety/Judgment: Impaired Sensation Sensation Light Touch: Impaired by gross assessment Proprioception:  Impaired by gross assessment Additional Comments: difficult to fully assess due to impaired cognition Coordination Gross Motor Movements are Fluid and Coordinated: No Fine Motor Movements are Fluid and Coordinated: No Coordination and Movement Description: tremors bilateral UE, decreased initiation, resists mobility due to significant anxiety Finger Nose Finger Test: unable to follow commands to complete Heel Shin Test: unable  to follow commands to complete Motor  Motor Motor: Motor apraxia;Abnormal postural alignment and control Motor - Skilled Clinical Observations: improved midline orientation anterior/posteriore in sitting compared to eval Mobility  Bed Mobility Rolling Right: Minimal Assistance - Patient > 75% Rolling Left: Minimal Assistance - Patient > 75% Supine to Sit: Minimal Assistance - Patient > 75% Sit to Supine: Minimal Assistance - Patient > 75% Transfers Sit to Stand: Minimal Assistance - Patient > 75%;Moderate Assistance - Patient 50-74% Stand to Sit: Minimal Assistance - Patient > 75%;Moderate Assistance - Patient 50-74%  Trunk/Postural Assessment  Cervical Assessment Cervical Assessment:  (head forward) Thoracic Assessment Thoracic Assessment:  (rounded shoudlers) Lumbar Assessment Lumbar Assessment:  (post pelvic tilt) Postural Control Postural Control: Deficits on evaluation (delayed/insufficient)  Balance Static Sitting Balance Static Sitting - Level of Assistance: 5: Stand by assistance Dynamic Sitting Balance Dynamic Sitting - Level of Assistance: 5: Stand by assistance Static Standing Balance Static Standing - Level of Assistance: 4: Min assist Dynamic Standing Balance Dynamic Standing - Level of Assistance: 3: Mod assist Extremity/Trunk Assessment RUE Assessment Passive Range of Motion (PROM) Comments: WNL Active Range of Motion (AROM) Comments: WFL General Strength Comments: 4-/5 globally; 3+/5 grip LUE Assessment Passive Range of Motion (PROM)  Comments: WNL Active Range of Motion (AROM) Comments: WFL General Strength Comments: globally 4/5; grip 3+/5   [name] 12/03/2019, 6:58 AM

## 2019-12-03 NOTE — Progress Notes (Signed)
Physical Therapy Session Note  Patient Details  Name: Hannah Padilla MRN: [id number] Date of Birth: 1950-05-03  Today's Date: 12/03/2019 PT Individual Time: 1100-1145 PT Individual Time Calculation (min): 45 min   Short Term Goals: Week 3:  PT Short Term Goal 1 (Week 3): Patient will perform basic transfer with mod A using LRAD consistently. PT Short Term Goal 2 (Week 3): Patient will ambulate with 50 feet with mod A demonstrating improved foot clearance to reduce fall risk. PT Short Term Goal 3 (Week 3): Patient's husband will initiate hands on training with all mobility.  Skilled Therapeutic Interventions/Progress Updates:    Pt received sitting in w/c, family friend at bedside, pt agreeable to PT session. Hypophonic voice but able to comprehend with effort. Pt requesting need to toilet. Stedy modA +2 transfer from w/c to 3-1 [facility]. Pt continent of bladder. Stedy modA +2 transfer back to w/c. Cues during transfers for initiation, participation, and technique. WC transport for energy conservatin purposes from her room to dayroom gym. Pt requesting to return to her room when we got there, reports "this is not what I wanted." With redirection, able to initiate sit<>stand with modA from w/c height to counter height. With BUE's supporting at counter, performed a few standing balance tasks with unilateral arm raises/pushes, requiring hand-over-hand assist for technique and sequencing. Pt tolerated ~8 minutes of standing prior to adamantly refusing further therapy intervention. Multiple efforts at redirecting, unsucessful. WC transport back to her room with totalA and pt requesting to return to bed. Therefore, stand<>pivot transfer with modA via face-to-face technique from w/c to EOB, required modA for sit>supine. Once positioned supine, she then reports need to void again. Supine<>sit with minA with bed features and sit<>stand with min/modA via face-to-face technique. She ambulated ~33ft from EOB to 3-1 BSC  with minA and B hand-held assist, requiring totalA for managing pants in standing and modA for controlled lowering to 3-1 BSC. Pt left sitting on toilet with family friend present, instructed to call out to nursing staff via pull cord when complete and to maintain close supervision until staff arrives; she voiced understanding. Pt missed 15 minutes of skilled therapy.  Therapy Documentation Precautions:  Precautions Precautions: Fall Precaution Comments: anxious with mobility-resists posteriorly; tremors bilateral hands; watch skin integrity bilateral palms due to clenching fists intermittently Restrictions Weight Bearing Restrictions: No  Therapy/Group: Individual Therapy  Hannah Padilla Hannah Padilla PT 12/03/2019, 11:51 AM

## 2019-12-04 ENCOUNTER — Inpatient Hospital Stay (HOSPITAL_COMMUNITY)
Admission: EM | Admit: 2019-12-04 | Discharge: 2019-12-10 | DRG: 640 | Disposition: A | Discharge status: Hospice | Payer: Medicare Other | Attending: Internal Medicine | Admitting: Internal Medicine

## 2019-12-04 ENCOUNTER — Encounter (HOSPITAL_COMMUNITY): Payer: Self-pay | Admitting: Emergency Medicine

## 2019-12-04 ENCOUNTER — Emergency Department (HOSPITAL_COMMUNITY): Payer: Medicare Other

## 2019-12-04 ENCOUNTER — Other Ambulatory Visit: Payer: Self-pay

## 2019-12-04 DIAGNOSIS — Z888 Allergy status to other drugs, medicaments and biological substances status: Secondary | ICD-10-CM

## 2019-12-04 DIAGNOSIS — Z79899 Other long term (current) drug therapy: Secondary | ICD-10-CM | POA: Diagnosis not present

## 2019-12-04 DIAGNOSIS — L89311 Pressure ulcer of right buttock, stage 1: Secondary | ICD-10-CM | POA: Diagnosis present

## 2019-12-04 DIAGNOSIS — Z1619 Resistance to other specified beta lactam antibiotics: Secondary | ICD-10-CM | POA: Diagnosis present

## 2019-12-04 DIAGNOSIS — R627 Adult failure to thrive: Secondary | ICD-10-CM | POA: Diagnosis not present

## 2019-12-04 DIAGNOSIS — E559 Vitamin D deficiency, unspecified: Secondary | ICD-10-CM | POA: Diagnosis not present

## 2019-12-04 DIAGNOSIS — E860 Dehydration: Secondary | ICD-10-CM | POA: Diagnosis not present

## 2019-12-04 DIAGNOSIS — Z1612 Extended spectrum beta lactamase (ESBL) resistance: Secondary | ICD-10-CM | POA: Diagnosis not present

## 2019-12-04 DIAGNOSIS — L8990 Pressure ulcer of unspecified site, unspecified stage: Secondary | ICD-10-CM | POA: Insufficient documentation

## 2019-12-04 DIAGNOSIS — Z20822 Contact with and (suspected) exposure to covid-19: Secondary | ICD-10-CM | POA: Diagnosis present

## 2019-12-04 DIAGNOSIS — N390 Urinary tract infection, site not specified: Secondary | ICD-10-CM | POA: Diagnosis present

## 2019-12-04 DIAGNOSIS — Z7189 Other specified counseling: Secondary | ICD-10-CM

## 2019-12-04 DIAGNOSIS — Z820 Family history of epilepsy and other diseases of the nervous system: Secondary | ICD-10-CM

## 2019-12-04 DIAGNOSIS — Z515 Encounter for palliative care: Secondary | ICD-10-CM | POA: Diagnosis not present

## 2019-12-04 DIAGNOSIS — E43 Unspecified severe protein-calorie malnutrition: Secondary | ICD-10-CM | POA: Diagnosis present

## 2019-12-04 DIAGNOSIS — F0390 Unspecified dementia without behavioral disturbance: Secondary | ICD-10-CM | POA: Diagnosis present

## 2019-12-04 DIAGNOSIS — E0590 Thyrotoxicosis, unspecified without thyrotoxic crisis or storm: Secondary | ICD-10-CM | POA: Diagnosis present

## 2019-12-04 DIAGNOSIS — Z681 Body mass index (BMI) 19 or less, adult: Secondary | ICD-10-CM

## 2019-12-04 DIAGNOSIS — G9341 Metabolic encephalopathy: Secondary | ICD-10-CM | POA: Diagnosis not present

## 2019-12-04 DIAGNOSIS — F064 Anxiety disorder due to known physiological condition: Secondary | ICD-10-CM | POA: Diagnosis not present

## 2019-12-04 DIAGNOSIS — Z825 Family history of asthma and other chronic lower respiratory diseases: Secondary | ICD-10-CM

## 2019-12-04 DIAGNOSIS — Z66 Do not resuscitate: Secondary | ICD-10-CM | POA: Diagnosis present

## 2019-12-04 DIAGNOSIS — L89321 Pressure ulcer of left buttock, stage 1: Secondary | ICD-10-CM | POA: Diagnosis present

## 2019-12-04 DIAGNOSIS — E876 Hypokalemia: Secondary | ICD-10-CM | POA: Diagnosis not present

## 2019-12-04 DIAGNOSIS — F419 Anxiety disorder, unspecified: Secondary | ICD-10-CM | POA: Diagnosis present

## 2019-12-04 DIAGNOSIS — K219 Gastro-esophageal reflux disease without esophagitis: Secondary | ICD-10-CM | POA: Diagnosis not present

## 2019-12-04 DIAGNOSIS — Z23 Encounter for immunization: Secondary | ICD-10-CM | POA: Diagnosis not present

## 2019-12-04 DIAGNOSIS — B9620 Unspecified Escherichia coli [E. coli] as the cause of diseases classified elsewhere: Secondary | ICD-10-CM | POA: Diagnosis present

## 2019-12-04 DIAGNOSIS — Z973 Presence of spectacles and contact lenses: Secondary | ICD-10-CM | POA: Diagnosis not present

## 2019-12-04 DIAGNOSIS — R4182 Altered mental status, unspecified: Secondary | ICD-10-CM

## 2019-12-04 LAB — CBC WITH DIFFERENTIAL/PLATELET
Abs Immature Granulocytes: 0.08 10*3/uL — ABNORMAL HIGH (ref 0.00–0.07)
Basophils Absolute: 0.1 10*3/uL (ref 0.0–0.1)
Basophils Relative: 1 %
Eosinophils Absolute: 0.3 10*3/uL (ref 0.0–0.5)
Eosinophils Relative: 2 %
HCT: 51.9 % — ABNORMAL HIGH (ref 36.0–46.0)
Hemoglobin: 17.4 g/dL — ABNORMAL HIGH (ref 12.0–15.0)
Immature Granulocytes: 1 %
Lymphocytes Relative: 23 %
Lymphs Abs: 2.5 10*3/uL (ref 0.7–4.0)
MCH: 33.2 pg (ref 26.0–34.0)
MCHC: 33.5 g/dL (ref 30.0–36.0)
MCV: 99 fL (ref 80.0–100.0)
Monocytes Absolute: 0.7 10*3/uL (ref 0.1–1.0)
Monocytes Relative: 7 %
Neutro Abs: 7.2 10*3/uL (ref 1.7–7.7)
Neutrophils Relative %: 66 %
Platelets: 362 10*3/uL (ref 150–400)
RBC: 5.24 MIL/uL — ABNORMAL HIGH (ref 3.87–5.11)
RDW: 13.7 % (ref 11.5–15.5)
WBC: 10.8 10*3/uL — ABNORMAL HIGH (ref 4.0–10.5)
nRBC: 0 % (ref 0.0–0.2)

## 2019-12-04 LAB — URINE CULTURE: Culture: >=100000 — AB

## 2019-12-04 LAB — COMPREHENSIVE METABOLIC PANEL
ALT: 56 U/L — ABNORMAL HIGH (ref 0–44)
AST: 26 U/L (ref 15–41)
Albumin: 4.3 g/dL (ref 3.5–5.0)
Alkaline Phosphatase: 108 U/L (ref 38–126)
Anion gap: 16 — ABNORMAL HIGH (ref 5–15)
BUN: 14 mg/dL (ref 8–23)
CO2: 15 mmol/L — ABNORMAL LOW (ref 22–32)
Calcium: 10.2 mg/dL (ref 8.9–10.3)
Chloride: 107 mmol/L (ref 98–111)
Creatinine, Ser: 0.8 mg/dL (ref 0.44–1.00)
GFR, Estimated: >60 mL/min (ref >60)
Glucose, Bld: 75 mg/dL (ref 70–99)
Potassium: 4.1 mmol/L (ref 3.5–5.1)
Sodium: 138 mmol/L (ref 135–145)
Total Bilirubin: 1.3 mg/dL — ABNORMAL HIGH (ref 0.3–1.2)
Total Protein: 7 g/dL (ref 6.5–8.1)

## 2019-12-04 LAB — URINALYSIS, ROUTINE W REFLEX MICROSCOPIC
Bacteria, UA: NONE SEEN
Bilirubin Urine: NEGATIVE
Glucose, UA: NEGATIVE mg/dL
Hgb urine dipstick: NEGATIVE
Ketones, ur: 5 mg/dL — AB
Nitrite: NEGATIVE
Protein, ur: NEGATIVE mg/dL
Specific Gravity, Urine: 1.015 (ref 1.005–1.030)
pH: 5 (ref 5.0–8.0)

## 2019-12-04 LAB — TSH: TSH: 1.701 u[IU]/mL (ref 0.350–4.500)

## 2019-12-04 LAB — LACTIC ACID, PLASMA
Lactic Acid, Venous: 2.6 mmol/L (ref 0.5–1.9)
Lactic Acid, Venous: 1.2 mmol/L (ref 0.5–1.9)

## 2019-12-04 MED ORDER — POTASSIUM CHLORIDE CRYS ER 20 MEQ PO TBCR: 20.0000 meq | EXTENDED_RELEASE_TABLET | Freq: Every day | ORAL | 0 refills | Status: DC

## 2019-12-04 MED ORDER — SODIUM CHLORIDE 0.9 % IV BOLUS
500.0000 mL | Freq: Once | INTRAVENOUS | Status: AC
  Administered 2019-12-04: 500 mL via INTRAVENOUS

## 2019-12-04 MED ORDER — CARBIDOPA-LEVODOPA 25-100 MG PO TABS
1.0000 | ORAL_TABLET | Freq: Three times a day (TID) | ORAL | Status: DC
  Administered 2019-12-04 – 2019-12-08 (×13): 1 via ORAL
  Filled 2019-12-04 (×13): qty 1

## 2019-12-04 MED ORDER — ENOXAPARIN SODIUM 40 MG/0.4ML ~~LOC~~ SOLN
40.0000 mg | SUBCUTANEOUS | Status: DC
  Administered 2019-12-04 – 2019-12-08 (×5): 40 mg via SUBCUTANEOUS
  Filled 2019-12-04 (×5): qty 0.4

## 2019-12-04 MED ORDER — PANTOPRAZOLE SODIUM 40 MG PO TBEC
40.0000 mg | DELAYED_RELEASE_TABLET | Freq: Every day | ORAL | Status: DC
  Administered 2019-12-05 – 2019-12-08 (×4): 40 mg via ORAL
  Filled 2019-12-04 (×5): qty 1

## 2019-12-04 MED ORDER — SULFAMETHOXAZOLE-TRIMETHOPRIM 800-160 MG PO TABS: 1.0000 | ORAL_TABLET | Freq: Two times a day (BID) | ORAL | 0 refills | Status: DC

## 2019-12-04 MED ORDER — LACTATED RINGERS IV BOLUS: 500.0000 mL | Freq: Once | INTRAVENOUS | Status: DC

## 2019-12-04 MED ORDER — MELATONIN 3 MG PO TABS: 3.0000 mg | ORAL_TABLET | Freq: Every day | ORAL | Status: DC

## 2019-12-04 MED ORDER — PROPRANOLOL HCL ER 60 MG PO CP24
60.0000 mg | ORAL_CAPSULE | Freq: Every day | ORAL | Status: DC
  Administered 2019-12-05 – 2019-12-08 (×4): 60 mg via ORAL
  Filled 2019-12-04 (×5): qty 1

## 2019-12-04 MED ORDER — LACTATED RINGERS IV SOLN: INTRAVENOUS | Status: DC

## 2019-12-04 MED ORDER — POTASSIUM CHLORIDE CRYS ER 20 MEQ PO TBCR
20.0000 meq | EXTENDED_RELEASE_TABLET | Freq: Every day | ORAL | Status: DC
  Administered 2019-12-04: 20 meq via ORAL

## 2019-12-04 MED ORDER — PIPERACILLIN-TAZOBACTAM 3.375 G IVPB
3.3750 g | Freq: Three times a day (TID) | INTRAVENOUS | Status: DC
  Administered 2019-12-04 – 2019-12-09 (×14): 3.375 g via INTRAVENOUS
  Filled 2019-12-04 (×14): qty 50

## 2019-12-04 MED ORDER — DIAZEPAM 5 MG PO TABS: 2.5000 mg | ORAL_TABLET | Freq: Two times a day (BID) | ORAL | Status: DC

## 2019-12-04 MED ORDER — SULFAMETHOXAZOLE-TRIMETHOPRIM 800-160 MG PO TABS: 1.0000 | ORAL_TABLET | Freq: Two times a day (BID) | ORAL | Status: DC

## 2019-12-04 MED ORDER — SODIUM CHLORIDE 0.9 % IV SOLN: INTRAVENOUS | Status: AC

## 2019-12-04 MED ORDER — SODIUM CHLORIDE 0.9 % IV SOLN
1.0000 g | Freq: Once | INTRAVENOUS | Status: AC
  Administered 2019-12-04: 1 g via INTRAVENOUS
  Filled 2019-12-04: qty 1

## 2019-12-04 MED ORDER — SERTRALINE HCL 100 MG PO TABS
100.0000 mg | ORAL_TABLET | Freq: Every day | ORAL | Status: DC
  Administered 2019-12-05 – 2019-12-08 (×4): 100 mg via ORAL
  Filled 2019-12-04 (×4): qty 1

## 2019-12-04 NOTE — Progress Notes (Signed)
Patient ID: Hannah Padilla, female   DOB: [birth year], 69 y.o.   MRN: [id number] [place] with MD who feels husband can borrow a wheelchair or transport chair from [place] then return once receives his transport chair. Ben-Clinical Supervisor to get him a chair to take home. Met with pt to update him on this.He feels much better regarding getting into their home with a wheelchair.

## 2019-12-04 NOTE — Progress Notes (Signed)
Inpatient Rehabilitation Care Coordinator  Discharge Note  The overall goal for the admission was met for:   Discharge location: Yes. D/c to home with 24/7 care from husband.  Length of Stay: Yes. 24 days.   Discharge activity level: Yes. Mod A; Max A for attention and awareness defecits  Home/community participation: Yes. Limited.   Services provided included: MD, RD, PT, OT, SLP, RN, CM, TR, Pharmacy, Neuropsych and SW  Financial Services: Private Insurance: [facility] Medicare  Follow-up services arranged: Home Health: [facility] for HHPT/OT/SLP/aide/SW and DME: [employer] [facility] for 3in1 [facility], hospital bed with gel overlay, RW, and transport chair (to be delivered to home)  Comments (or additional information): contact pt husband [name] [phone]  Patient/Family verbalized understanding of follow-up arrangements: Yes  Individual responsible for coordination of the follow-up plan: Pt to have assistance with coordinating care needs.   Confirmed correct DME delivered: Rana Snare 12/04/2019    Loralee Pacas, MSW, [place] Office: [phone] Cell: [phone] Fax: [phone]

## 2019-12-04 NOTE — Progress Notes (Signed)
Pt discharged to home via wheelchair and with husband. Pt did receive last dose of IV abx per order prior to discharge. Pt tolerated well. IV discontinued. Pt did leave in personal wheelchair and with all belongings. Pt escorted to car via this nurse and pt husband. No acute distress noted. Skin warm and dry. Peri care performed prior to discharge.

## 2019-12-04 NOTE — Discharge Instructions (Signed)
Inpatient Rehab Discharge Instructions  Hannah Padilla Discharge date and time: No discharge date for patient encounter.   Activities/Precautions/ Functional Status: Activity: activity as tolerated Diet: soft Wound Care: none needed Functional status:  ___ No restrictions     ___ Walk up steps independently ___ 24/7 supervision/assistance   ___ Walk up steps with assistance ___ Intermittent supervision/assistance  ___ Bathe/dress independently ___ Walk with walker     _x__ Bathe/dress with assistance ___ Walk Independently    ___ Shower independently ___ Walk with assistance    ___ Shower with assistance ___ No alcohol     ___ Return to work/school ________  COMMUNITY REFERRALS UPON DISCHARGE:    Home Health:   PT     OT     ST    SNA      SW   RN                Agency: [facility]  Phone:[phone] *Please expect follow-up within 2-3 days of discharge to schedule your home visit. If you have not received follow-up, be sure to contact the branch directly.     Medical Equipment/Items Ordered: hospital bed with gel overlay, transport chair, 3in1 bedside commode, transport chair, rolling walker                                                 Agency/Supplier: [employer] [facility] [phone]   Special Instructions: No driving smoking or alcohol   My questions have been answered and I understand these instructions. I will adhere to these goals and the provided educational materials after my discharge from the hospital.  Patient/Caregiver Signature _______________________________ Date __________  Clinician Signature _______________________________________ Date __________  Please bring this form and your medication list with you to all your follow-up doctor's appointments.

## 2019-12-04 NOTE — ED Provider Notes (Signed)
[facility] EMERGENCY DEPARTMENT Provider Note   CSN: [id number] Arrival date & time: 12/04/19  1713     History Chief Complaint  Patient presents with  . Recurrent UTI    Hannah Padilla is a 69 y.o. female.  The history is provided by a relative.  Illness Location:  UTI dx 11/30/19- dc today on bactrim  Quality:  Worsening mental status  Severity:  Mild Duration:  4 days Timing:  Constant Progression:  Worsening Chronicity:  New Associated symptoms: no cough, no diarrhea, no fever and no vomiting        Past Medical History:  Diagnosis Date  . Anxiety   . Colon polyp 2015  . Elevated serum free T4 level    husband report  . Farsightedness    wears glasses  . Mood change 2021   major change in affect, health issues with multiple ED visits, hospitalization, starting 07/10/2019  . RMSF [facility] spotted fever) 2021  . Urinary tract infection     Patient Active Problem List   Diagnosis Date Noted  . Protein-calorie malnutrition, severe 12/02/2019  . Palliative care by specialist   . Hypoalbuminemia due to protein-calorie malnutrition ([place])   . Failure to thrive in adult   . Anxiety disorder due to general medical condition   . Progressive dementia with uncertain etiology ([place]) 11/09/2019  . Acute metabolic encephalopathy 02/13/3233  . Hallucination 10/05/2019  . Dehydration 10/05/2019  . Dysphagia 10/05/2019  . Incontinence of feces 09/29/2019  . Urinary incontinence 09/29/2019  . Adrenal mass ([place]) 09/02/2019  . Decrease in appetite 09/02/2019  . Weight loss 09/02/2019  . Acute bilateral thoracic back pain 09/02/2019  . Metabolic encephalopathy 57/32/2025  . Hyperthyroidism 08/19/2019  . Panic attack 08/18/2019  . E-coli UTI 08/18/2019  . UTI (urinary tract infection) 08/12/2019  . Altered mental state 08/12/2019  . Grief 08/12/2019  . RMSF [facility] spotted fever) 07/27/2019  . Anxiety 07/21/2019  . Transaminitis  07/21/2019  . Abnormal thyroid blood test 07/21/2019  . Sleep disturbance 07/21/2019  . Hypokalemia 07/21/2019  . Leukocytosis 07/21/2019  . Tremor 07/21/2019  . Vitamin D deficiency 07/21/2019  . Palpitation 07/21/2019  . Vaginal bleeding 07/08/2018  . DNR (do not resuscitate) discussion 07/08/2018  . Chronic nausea 07/08/2018  . Screening for cervical cancer 07/08/2018  . Vaginal mass 07/08/2018  . GERD without esophagitis 07/08/2018  . Vaccine counseling 07/08/2018  . Estrogen deficiency 07/08/2018  . Post-menopausal 07/08/2018  . Need for pneumococcal vaccination 07/08/2018    Past Surgical History:  Procedure Laterality Date  . BREAST REDUCTION SURGERY    . CARPAL TUNNEL RELEASE     right  . COLONOSCOPY  4270   [place],   . lichenoid keratosis  09/05/11   Skin, left anterior shouldee  . PARTIAL HYSTERECTOMY     still has ovaries  . WISDOM TOOTH EXTRACTION       OB History    Gravida  0   Para  0   Term  0   Preterm  0   AB  0   Living  0     SAB  0   TAB  0   Ectopic  0   Multiple  0   Live Births  0           Family History  Problem Relation Age of Onset  . Alzheimer's disease Mother   . Other Father        cognitive  decline  . COPD Father        smoker  . Protein S deficiency Other   . Heart disease Neg Hx   . Diabetes Neg Hx   . Cancer Neg Hx   . Stroke Neg Hx   . Hypertension Neg Hx   . Hyperlipidemia Neg Hx     Social History   Tobacco Use  . Smoking status: Never Smoker  . Smokeless tobacco: Never Used  Vaping Use  . Vaping Use: Never used  Substance Use Topics  . Alcohol use: Not Currently    Alcohol/week: 5.0 standard drinks    Types: 5 Glasses of wine per week    Comment: none in a  year (as of 10/08/19)  . Drug use: No    Home Medications Prior to Admission medications   Medication Sig Start Date End Date Taking? Authorizing Provider  acetaminophen (TYLENOL) 325 MG tablet Take 2 tablets (650 mg total) by mouth  every 6 (six) hours as needed for mild pain (or Fever >/= 101). 08/22/19  Yes Elgergawy, Silver Huguenin, MD  diazepam (VALIUM) 5 MG tablet Take 0.5 tablets (2.5 mg total) by mouth every 12 (twelve) hours. 12/03/19  Yes Angiulli, Lavon Paganini, PA-C  omeprazole (PRILOSEC) 40 MG capsule Take 1 capsule (40 mg total) by mouth daily. 12/03/19  Yes Angiulli, Lavon Paganini, PA-C  polyethylene glycol (MIRALAX / GLYCOLAX) 17 g packet Take 17 g by mouth daily. 11/08/19  Yes Amin, Jeanella Flattery, MD  propranolol ER (INDERAL LA) 60 MG 24 hr capsule Take 1 capsule (60 mg total) by mouth daily. 12/03/19 12/02/20 Yes Angiulli, Lavon Paganini, PA-C  sertraline (ZOLOFT) 100 MG tablet Take 1 tablet (100 mg total) by mouth daily. 12/03/19  Yes Angiulli, Lavon Paganini, PA-C  carbidopa-levodopa (SINEMET IR) 25-100 MG tablet Take 1 tablet by mouth 3 (three) times daily. 12/03/19   Angiulli, Lavon Paganini, PA-C  melatonin 3 MG TABS tablet Take 1 tablet (3 mg total) by mouth at bedtime. 12/03/19   Angiulli, Lavon Paganini, PA-C  potassium chloride SA (KLOR-CON) 20 MEQ tablet Take 1 tablet (20 mEq total) by mouth daily. 12/04/19   Angiulli, Lavon Paganini, PA-C  sulfamethoxazole-trimethoprim (BACTRIM DS) 800-160 MG tablet Take 1 tablet by mouth every 12 (twelve) hours. 12/04/19   Angiulli, Lavon Paganini, PA-C  Vitamin D, Ergocalciferol, (DRISDOL) 1.25 MG (50000 UNIT) CAPS capsule Take 1 capsule (50,000 Units total) by mouth every 7 (seven) days. 12/03/19   Angiulli, Lavon Paganini, PA-C    Allergies    Methimazole  Review of Systems   Review of Systems  Constitutional: Negative for fever.  Respiratory: Negative for cough.   Gastrointestinal: Negative for diarrhea and vomiting.  Genitourinary: Positive for decreased urine volume.  Psychiatric/Behavioral: Positive for confusion.  All other systems reviewed and are negative.   Physical Exam Updated Vital Signs BP 122/76   Pulse 64   Temp 98 F (36.7 C) (Oral)   Resp 18   Ht 5\' 7"  (1.702 m)   Wt 56.3 kg   SpO2 100%    BMI 19.44 kg/m   Physical Exam Vitals reviewed.  Constitutional:      Appearance: Normal appearance.  HENT:     Head: Normocephalic and atraumatic.     Nose: Nose normal.  Eyes:     Conjunctiva/sclera: Conjunctivae normal.  Cardiovascular:     Rate and Rhythm: Normal rate.     Heart sounds: Normal heart sounds.  Pulmonary:     Effort: Pulmonary effort is  normal.     Breath sounds: Normal breath sounds.  Abdominal:     General: Abdomen is flat.     Palpations: Abdomen is soft.     Tenderness: There is no abdominal tenderness.  Musculoskeletal:     Cervical back: Neck supple.     Right lower leg: No edema.     Left lower leg: No edema.  Skin:    General: Skin is warm and dry.  Neurological:     Mental Status: She is alert.     ED Results / Procedures / Treatments   Labs (all labs ordered are listed, but only abnormal results are displayed) Labs Reviewed  CBC WITH DIFFERENTIAL/PLATELET - Abnormal; Notable for the following components:      Result Value   WBC 10.8 (*)    RBC 5.24 (*)    Hemoglobin 17.4 (*)    HCT 51.9 (*)    Abs Immature Granulocytes 0.08 (*)    All other components within normal limits  URINALYSIS, ROUTINE W REFLEX MICROSCOPIC - Abnormal; Notable for the following components:   Ketones, ur 5 (*)    Leukocytes,Ua TRACE (*)    All other components within normal limits  COMPREHENSIVE METABOLIC PANEL - Abnormal; Notable for the following components:   CO2 15 (*)    ALT 56 (*)    Total Bilirubin 1.3 (*)    Anion gap 16 (*)    All other components within normal limits  LACTIC ACID, PLASMA - Abnormal; Notable for the following components:   Lactic Acid, Venous 2.6 (*)    All other components within normal limits  RESPIRATORY PANEL BY RT PCR (FLU A&B, COVID)  LACTIC ACID, PLASMA  TSH  C-REACTIVE PROTEIN  SEDIMENTATION RATE  CBC WITH DIFFERENTIAL/PLATELET  COMPREHENSIVE METABOLIC PANEL  MAGNESIUM    EKG None  Radiology DG Chest Portable 1  View  Result Date: 12/04/2019 CLINICAL DATA:  Altered mental status. EXAM: PORTABLE CHEST 1 VIEW COMPARISON:  Chest CT 10/26/2019 FINDINGS: The cardiac silhouette, mediastinal and hilar contours are within normal limits and stable. The lungs are clear. No pleural effusions. No pulmonary lesions. The bony thorax is intact. IMPRESSION: No acute cardiopulmonary findings. Electronically Signed   By: Marijo Sanes M.D.   On: 12/04/2019 21:03    Procedures Procedures (including critical care time)  Medications Ordered in ED Medications  lactated ringers bolus 500 mL (has no administration in time range)  lactated ringers infusion (has no administration in time range)  propranolol ER (INDERAL LA) 24 hr capsule 60 mg (has no administration in time range)  sertraline (ZOLOFT) tablet 100 mg (has no administration in time range)  pantoprazole (PROTONIX) EC tablet 40 mg (has no administration in time range)  carbidopa-levodopa (SINEMET IR) 25-100 MG per tablet immediate release 1 tablet (has no administration in time range)  enoxaparin (LOVENOX) injection 40 mg (has no administration in time range)  piperacillin-tazobactam (ZOSYN) IVPB 3.375 g (has no administration in time range)    ED Course  I have reviewed the triage vital signs and the nursing notes.  Pertinent labs & imaging results that were available during my care of the patient were reviewed by me and considered in my medical decision making (see chart for details).    MDM Rules/Calculators/A&P                           Medical Decision Making: ANARIA KRONER is a 69 y.o. female who  presented to the ED today with recurrent UTI concern. Family reports worsening mental status, more confused and weak.   Past medical history significant for PMHx RMSF, recurrent UTIs, Dementia Pt dc from rehab today after tx for impaired cognition apraxia with panic attacks generalized weakness and falls secondary to acute metabolic encephalopathy  progressive dementia of unknown etiology. Pt dx with UTI on 10/25, placed empirically on Rocephin with culture showing E. coli changed to Bactrim and discharged to home on 10 days antibiotics.  Pt has worsening mental status since discharge per family and they are concerned that they cannot take care of her at home, believe her UTI is getting worse despite abx.  Reviewed and confirmed nursing documentation for past medical history, family history, social history.  On my initial exam, the pt was in NAD, resting in bed, whispers responses to some questions, following commands.   Leukocytosis and elevated lactic acid, concerning for unresolved UTI causing pt decline in mental status.   Consults: none performed  All radiology and laboratory studies reviewed independently and with my attending physician, agree with reading provided by radiologist unless otherwise noted.   Pt with leukocytosis today and elevated lactic with worsening AMS per family.  CXR unremarkable. UA without evidence of infection.  Concern for partially tx UTI given negative UA and worsening clinically. Would recommend fluids and IV abx inpatient.   Based on the above findings, I believe patient requires admission.  The above care was discussed with and agreed upon by my attending physician.  Emergency Department Medication Summary:  Medications  lactated ringers bolus 500 mL (has no administration in time range)  lactated ringers infusion (has no administration in time range)  propranolol ER (INDERAL LA) 24 hr capsule 60 mg (has no administration in time range)  sertraline (ZOLOFT) tablet 100 mg (has no administration in time range)  pantoprazole (PROTONIX) EC tablet 40 mg (has no administration in time range)  carbidopa-levodopa (SINEMET IR) 25-100 MG per tablet immediate release 1 tablet (has no administration in time range)  enoxaparin (LOVENOX) injection 40 mg (has no administration in time range)    piperacillin-tazobactam (ZOSYN) IVPB 3.375 g (has no administration in time range)       Final Clinical Impression(s) / ED Diagnoses Final diagnoses:  Altered mental status, unspecified altered mental status type    Rx / DC Orders ED Discharge Orders    None       Roosevelt Locks, MD 12/04/19 2312    Davonna Belling, MD 12/05/19 0000

## 2019-12-04 NOTE — ED Notes (Signed)
Lactic Acid 2.6

## 2019-12-04 NOTE — Progress Notes (Signed)
Speech Language Pathology Discharge Summary  Patient Details  Name: Hannah Padilla MRN: [id number] Date of Birth: [birth year]  Today's Date: 12/04/2019     Patient has met 4 of 6 long term goals.  Patient to discharge at overall Mod;Max level.  Reasons goals not met: significant anxiety and confusion   Clinical Impression/Discharge Summary: Patient met 4/6 LTG's which had been downgraded to moderate A level due to lack of progress. Goals not met were for problem solving and sustaining attention which was significantly impacted by patient's severe anxiety and ongoing confusion. Patient is largely aphonic unless cued to speak louder. She is able to manage cup to take drinks with setup assistance and modA for attention to task and mod-max encouragement.  Care Partner:  Caregiver Able to Provide Assistance: Yes  Type of Caregiver Assistance: Physical;Cognitive  Recommendation:  Home Health SLP;24 hour supervision/assistance  Rationale for SLP Follow Up: Maximize cognitive function and independence;Reduce caregiver burden;Maximize functional communication   Equipment: none for speech   Reasons for discharge: Discharged from hospital   Patient/Family Agrees with Progress Made and Goals Achieved: Yes   Sonia Baller, MA, CCC-SLP Speech Therapy

## 2019-12-04 NOTE — ED Notes (Signed)
This RN attempted to call report to 5N RN. Was told that the 5N RN will be available in 10 minutes. This RN provided work phone number for [employer] to call.

## 2019-12-04 NOTE — ED Triage Notes (Signed)
Pt just dc from the hospital for UTI treatment, per family member she is not any better and pt still very alter. Wants her to be check, pt still on PO Abx for UTI.

## 2019-12-04 NOTE — Progress Notes (Signed)
Pharmacy Antibiotic Note  Hannah Padilla is a 69 y.o. female admitted on 12/04/2019 with UTI, 10/26 UCx with E coli ESBL +.  Pharmacy has been consulted for zosyn dosing.  Plan: Zosyn 3.375g IV every 8 hours (extended infusion) Monitor renal function, clinical progression and LOT  Height: 5\' 7"  (170.2 cm) Weight: 56.3 kg (124 lb 1.9 oz) IBW/kg (Calculated) : 61.6  Temp (24hrs), Avg:97.9 F (36.6 C), Min:97.8 F (36.6 C), Max:98 F (36.7 C)  Recent Labs  Lab 11/30/19 1603 12/02/19 0627 12/03/19 1047 12/04/19 1750  WBC 9.5 7.4  --  10.8*  CREATININE 0.73 0.78 0.82 0.80  LATICACIDVEN  --   --   --  2.6*    Estimated Creatinine Clearance: 59.8 mL/min (by C-G formula based on SCr of 0.8 mg/dL).    Allergies  Allergen Reactions  . Methimazole     Possible cause of transaminitis    Bertis Ruddy, PharmD Clinical Pharmacist ED Pharmacist Phone # [phone] 12/04/2019 10:43 PM

## 2019-12-04 NOTE — Progress Notes (Addendum)
[facility] PHYSICAL MEDICINE & REHABILITATION PROGRESS NOTE   Subjective/Complaints: Pt in bathroom. Had a good night of sleep. Husband nervous about discharge. Apparently w/c ordered is "not in stock"  ROS: Limited due to cognitive/behavioral   Objective:   No results found. Recent Labs    12/02/19 0627  WBC 7.4  HGB 15.2*  HCT 45.5  PLT 313   Recent Labs    12/02/19 0627 12/03/19 1047  NA 140 138  K 3.8 3.7  CL 110 109  CO2 16* 15*  GLUCOSE 82 78  BUN 17 15  CREATININE 0.78 0.82  CALCIUM 9.5 9.7    Intake/Output Summary (Last 24 hours) at 12/04/2019 0916 Last data filed at 12/04/2019 0654 Gross per 24 hour  Intake 1527.15 ml  Output 300 ml  Net 1227.15 ml    Physical Exam: Vital Signs Blood pressure 99/63, pulse (!) 59, temperature 97.8 F (36.6 C), temperature source Axillary, resp. rate 16, height 5\' 7"  (1.702 m), weight 56.3 kg, SpO2 99 %.  Constitutional: No distress . Vital signs reviewed. HEENT: EOMI, oral membranes moist Neck: supple Cardiovascular: RRR without murmur. No JVD    Respiratory/Chest: CTA Bilaterally without wheezes or rales. Normal effort    GI/Abdomen: BS +, non-tender, non-distended Ext: no clubbing, cyanosis, or edema Psych: much more calm overall, still flat Skin: Clean and intact without signs of breakdown  Musc: No edema in extremities.  No tenderness in extremities. Neuro: initiating more.  Moves all 4's. Still with intermittent tremor   Assessment/Plan: 1. Functional deficits secondary to acute, progressive dementia which require 3+ hours per day of interdisciplinary therapy in a comprehensive inpatient rehab setting.  Physiatrist is providing close team supervision and 24 hour management of active medical problems listed below.  Physiatrist and rehab team continue to assess barriers to discharge/monitor patient progress toward functional and medical goals  Care Tool:  Bathing    Body parts bathed by patient: Chest,  Abdomen, Left arm, Right arm, Front perineal area, Right upper leg, Left upper leg   Body parts bathed by helper: Buttocks, Right lower leg, Left lower leg, Face     Bathing assist Assist Level: Moderate Assistance - Patient 50 - 74%     Upper Body Dressing/Undressing Upper body dressing   What is the patient wearing?: Pull over shirt    Upper body assist Assist Level: Maximal Assistance - Patient 25 - 49%    Lower Body Dressing/Undressing Lower body dressing      What is the patient wearing?: Underwear/pull up, Pants     Lower body assist Assist for lower body dressing: Maximal Assistance - Patient 25 - 49%     Toileting Toileting    Toileting assist Assist for toileting: Maximal Assistance - Patient 25 - 49%     Transfers Chair/bed transfer  Transfers assist     Chair/bed transfer assist level: Moderate Assistance - Patient 50 - 74% Chair/bed transfer assistive device: Programmer, multimedia   Ambulation assist   Ambulation activity did not occur: Safety/medical concerns (limited by anxiety and decreased strength/balance)  Assist level: Moderate Assistance - Patient 50 - 74% Assistive device: Walker-rolling Max distance: Average 50 ft, max 308 ft   Walk 10 feet activity   Assist  Walk 10 feet activity did not occur: Safety/medical concerns  Assist level: Moderate Assistance - Patient - 50 - 74% Assistive device: Walker-rolling   Walk 50 feet activity   Assist Walk 50 feet with 2 turns activity did  not occur: Safety/medical concerns (single turn/57ft)  Assist level: Moderate Assistance - Patient - 50 - 74% Assistive device: Walker-rolling    Walk 150 feet activity   Assist Walk 150 feet activity did not occur: Safety/medical concerns  Assist level: Moderate Assistance - Patient - 50 - 74% Assistive device: Walker-rolling    Walk 10 feet on uneven surface  activity   Assist Walk 10 feet on uneven surfaces activity did not  occur: Safety/medical concerns (decreased balance, strength, and safety)         Wheelchair     Assist Will patient use wheelchair at discharge?: Yes (transport chair due to inability to functionally propel manual w/c)   Wheelchair activity did not occur: Safety/medical concerns (decreased motor planning and awareness limiting ability to progress with manual propulsion)         Wheelchair 50 feet with 2 turns activity    Assist    Wheelchair 50 feet with 2 turns activity did not occur: Safety/medical concerns       Wheelchair 150 feet activity     Assist  Wheelchair 150 feet activity did not occur: Safety/medical concerns   Assist Level: Maximal Assistance - Patient 25 - 49%   Blood pressure 99/63, pulse (!) 59, temperature 97.8 F (36.6 C), temperature source Axillary, resp. rate 16, height 5\' 7"  (1.702 m), weight 56.3 kg, SpO2 99 %.  Medical Problem List and Plan: 1.Progressive decrease in functional mobility, impaired cognition, apraxia with panic attacks generalized weakness and fallssecondary to acute metabolic encephalopathy/progressive dementiaof unknown etiology.  Symptoms most consistent with a functional neurological disorder. -Continue Sinemet trial 25-100 mg 3 times daily  -dc home today   -working on a loaner w/c to use at home until ordered chair arrives   -pt to f/u with me, neuro, psychiatry, endocrine, primary as an outpt   -HH, palliative care services as outpt 2. Antithrombotics: -DVT/anticoagulation:Lovenox -antiplatelet therapy: N/A 3. Pain Management:Tylenolas needed. Has headache at times  Controlled 10/28 4. Mood:Zoloft 50 mg daily, Valium2.5mg  q12 scheduled -pt sees psychiatry as outpt -antipsychotic agents: N/A  -continue melatonin to assist with sleep  -husband prefers not to add further medication for anxiety  -will need ongoing psych support at  discharge  -10/28 improved with rx of UTI  5. Neuropsych: This patientis notcapable of making decisions on herown behalf-given neuro-behavioral issues. 6. Skin/Wound Care:Routine skin checks 7. Fluids/Electrolytes/Nutrition:encourage po    -Megace  800mg  bid per home regimen, marinol dc'ed    -weights trending up  -husband/family pushing intake---needs to continue  -intake still suboptimal but prealbumin has been stable Filed Weights   12/01/19 1100 12/02/19 0500 12/04/19 0405  Weight: 54.9 kg 55.4 kg 56.3 kg   8. Hyper thyroidism. Held Tapazole 5 mg 3 times daily per #9. continue Inderal 60 mg daily.   -pt will see endocrine as outpt  9. Elevated LFT's d/t tapazole  LFT's back to WNL  -tapazole, DC'd  -f/u with outpt endocrinology  10.  Hypokalemia  Potassium 3.7---continue supplement 11.  E Coli UTI  -give addnl dose of ctx this morning.   -culture sens are pending  -will send in rx for abx after we receive sensitivies 12.  Dysphagia  D3 thins with full supervision, advance diet as tolerated  LOS: 25 days A FACE TO FACE EVALUATION WAS PERFORMED  Meredith Staggers 12/04/2019, 9:16 AM

## 2019-12-05 DIAGNOSIS — B9620 Unspecified Escherichia coli [E. coli] as the cause of diseases classified elsewhere: Secondary | ICD-10-CM

## 2019-12-05 DIAGNOSIS — Z973 Presence of spectacles and contact lenses: Secondary | ICD-10-CM | POA: Diagnosis not present

## 2019-12-05 DIAGNOSIS — Z1612 Extended spectrum beta lactamase (ESBL) resistance: Secondary | ICD-10-CM | POA: Diagnosis present

## 2019-12-05 DIAGNOSIS — R627 Adult failure to thrive: Secondary | ICD-10-CM | POA: Diagnosis not present

## 2019-12-05 DIAGNOSIS — K219 Gastro-esophageal reflux disease without esophagitis: Secondary | ICD-10-CM

## 2019-12-05 DIAGNOSIS — Z1619 Resistance to other specified beta lactam antibiotics: Secondary | ICD-10-CM | POA: Diagnosis present

## 2019-12-05 DIAGNOSIS — F419 Anxiety disorder, unspecified: Secondary | ICD-10-CM | POA: Diagnosis present

## 2019-12-05 DIAGNOSIS — L89321 Pressure ulcer of left buttock, stage 1: Secondary | ICD-10-CM | POA: Diagnosis present

## 2019-12-05 DIAGNOSIS — N390 Urinary tract infection, site not specified: Secondary | ICD-10-CM

## 2019-12-05 DIAGNOSIS — Z66 Do not resuscitate: Secondary | ICD-10-CM | POA: Diagnosis present

## 2019-12-05 DIAGNOSIS — Z515 Encounter for palliative care: Secondary | ICD-10-CM | POA: Diagnosis not present

## 2019-12-05 DIAGNOSIS — Z681 Body mass index (BMI) 19 or less, adult: Secondary | ICD-10-CM | POA: Diagnosis not present

## 2019-12-05 DIAGNOSIS — E860 Dehydration: Secondary | ICD-10-CM | POA: Diagnosis present

## 2019-12-05 DIAGNOSIS — L89311 Pressure ulcer of right buttock, stage 1: Secondary | ICD-10-CM | POA: Diagnosis present

## 2019-12-05 DIAGNOSIS — Z20822 Contact with and (suspected) exposure to covid-19: Secondary | ICD-10-CM | POA: Diagnosis present

## 2019-12-05 DIAGNOSIS — E43 Unspecified severe protein-calorie malnutrition: Secondary | ICD-10-CM | POA: Diagnosis present

## 2019-12-05 DIAGNOSIS — Z79899 Other long term (current) drug therapy: Secondary | ICD-10-CM | POA: Diagnosis not present

## 2019-12-05 DIAGNOSIS — E0590 Thyrotoxicosis, unspecified without thyrotoxic crisis or storm: Secondary | ICD-10-CM | POA: Diagnosis not present

## 2019-12-05 DIAGNOSIS — E876 Hypokalemia: Secondary | ICD-10-CM | POA: Diagnosis present

## 2019-12-05 DIAGNOSIS — Z7189 Other specified counseling: Secondary | ICD-10-CM | POA: Diagnosis not present

## 2019-12-05 DIAGNOSIS — Z888 Allergy status to other drugs, medicaments and biological substances status: Secondary | ICD-10-CM | POA: Diagnosis not present

## 2019-12-05 DIAGNOSIS — E559 Vitamin D deficiency, unspecified: Secondary | ICD-10-CM | POA: Diagnosis present

## 2019-12-05 DIAGNOSIS — F0390 Unspecified dementia without behavioral disturbance: Secondary | ICD-10-CM

## 2019-12-05 DIAGNOSIS — G9341 Metabolic encephalopathy: Secondary | ICD-10-CM | POA: Diagnosis present

## 2019-12-05 LAB — COMPREHENSIVE METABOLIC PANEL
ALT: 10 U/L (ref 0–44)
AST: 18 U/L (ref 15–41)
Albumin: 3.4 g/dL — ABNORMAL LOW (ref 3.5–5.0)
Alkaline Phosphatase: 77 U/L (ref 38–126)
Anion gap: 12 (ref 5–15)
BUN: 13 mg/dL (ref 8–23)
CO2: 14 mmol/L — ABNORMAL LOW (ref 22–32)
Calcium: 9.3 mg/dL (ref 8.9–10.3)
Chloride: 112 mmol/L — ABNORMAL HIGH (ref 98–111)
Creatinine, Ser: 0.81 mg/dL (ref 0.44–1.00)
GFR, Estimated: >60 mL/min (ref >60)
Glucose, Bld: 78 mg/dL (ref 70–99)
Potassium: 3.5 mmol/L (ref 3.5–5.1)
Sodium: 138 mmol/L (ref 135–145)
Total Bilirubin: 1.3 mg/dL — ABNORMAL HIGH (ref 0.3–1.2)
Total Protein: 5.7 g/dL — ABNORMAL LOW (ref 6.5–8.1)

## 2019-12-05 LAB — CBC WITH DIFFERENTIAL/PLATELET
Abs Immature Granulocytes: 0.09 10*3/uL — ABNORMAL HIGH (ref 0.00–0.07)
Basophils Absolute: 0.1 10*3/uL (ref 0.0–0.1)
Basophils Relative: 1 %
Eosinophils Absolute: 0.2 10*3/uL (ref 0.0–0.5)
Eosinophils Relative: 3 %
HCT: 41.3 % (ref 36.0–46.0)
Hemoglobin: 14.3 g/dL (ref 12.0–15.0)
Immature Granulocytes: 1 %
Lymphocytes Relative: 25 %
Lymphs Abs: 2.1 10*3/uL (ref 0.7–4.0)
MCH: 34 pg (ref 26.0–34.0)
MCHC: 34.6 g/dL (ref 30.0–36.0)
MCV: 98.1 fL (ref 80.0–100.0)
Monocytes Absolute: 0.7 10*3/uL (ref 0.1–1.0)
Monocytes Relative: 8 %
Neutro Abs: 5.4 10*3/uL (ref 1.7–7.7)
Neutrophils Relative %: 62 %
Platelets: 257 10*3/uL (ref 150–400)
RBC: 4.21 MIL/uL (ref 3.87–5.11)
RDW: 13.8 % (ref 11.5–15.5)
WBC: 8.6 10*3/uL (ref 4.0–10.5)
nRBC: 0 % (ref 0.0–0.2)

## 2019-12-05 LAB — C-REACTIVE PROTEIN: CRP: <0.5 mg/dL (ref <1.0)

## 2019-12-05 LAB — MAGNESIUM: Magnesium: 2.1 mg/dL (ref 1.7–2.4)

## 2019-12-05 LAB — RESPIRATORY PANEL BY RT PCR (FLU A&B, COVID)
Influenza A by PCR: NEGATIVE
Influenza B by PCR: NEGATIVE
SARS Coronavirus 2 by RT PCR: NEGATIVE

## 2019-12-05 LAB — SEDIMENTATION RATE: Sed Rate: 1 mm/hr (ref 0–22)

## 2019-12-05 MED ORDER — POLYETHYLENE GLYCOL 3350 17 G PO PACK: 17.0000 g | PACK | Freq: Every day | ORAL | Status: DC | PRN

## 2019-12-05 MED ORDER — ENSURE ENLIVE PO LIQD
237.0000 mL | Freq: Two times a day (BID) | ORAL | Status: DC
  Administered 2019-12-05 (×2): 237 mL via ORAL

## 2019-12-05 MED ORDER — SENNOSIDES-DOCUSATE SODIUM 8.6-50 MG PO TABS
2.0000 | ORAL_TABLET | Freq: Two times a day (BID) | ORAL | Status: DC
  Administered 2019-12-05 – 2019-12-08 (×7): 2 via ORAL
  Filled 2019-12-05 (×8): qty 2

## 2019-12-05 MED ORDER — HALOPERIDOL LACTATE 5 MG/ML IJ SOLN
2.0000 mg | Freq: Once | INTRAMUSCULAR | Status: AC
  Administered 2019-12-07: 2 mg via INTRAVENOUS
  Filled 2019-12-05: qty 1

## 2019-12-05 NOTE — H&P (Signed)
History and Physical    Hannah Padilla JME:[id number] DOB: 1951/01/07 DOA: 12/04/2019  PCP: Carlena Hurl, PA-C  Patient coming from: Home   Chief Complaint:  Chief Complaint  Patient presents with  . Recurrent UTI     HPI:    69 year old female with past medical history of hypothyroidism, recent diagnosis of [place] spotted fever (08/2019, S/P Tx) who presented on 9/14 with progressive generalized weakness, falls and confusion over the preceding 3 months.  Patient was hospitalized at [facility] from 10/20/2019 until 11/09/2019.  Patient was evaluated by multiple neurologists and underwent an extensive work-up for her progressive neurologic decline.  Extensive work-up up until the end of hospitalization included: Unremarkable TSH, unremarkable ammonia level, unremarkable B1 level, RMSF borderline positive IgG, negative IgM(she completed 10 day course of doxy in July), negative Lyme, normal ESR, normal thyroglobulin and thyroid peroxidase Ab, negative anti-hu, anti-yo, anti-ri.  Additionally, patient underwent lumbar puncture negative for oligoclonal bands, negative for paraneoplastic CSF panel (LGI1 was not included), negative for neuron-specific enolase, negative for serum anti-LGI 1, negative serum autoimmune encephalopathy panel and negative 14-3-3eta.  With extensive work-up noted above, patient was diagnosed with rapidly progressive dementia of undetermined etiology.  Patient received a course of 5 days of IV steroids during the hospital course and was thought to respond to this.  Patient was also placed on a trial of Sinemet which she is currently on.  Patient was discharged from the inpatient service on 10/4 to inpatient rehab.  Patient strength and physical activity did gradually improve throughout rehab.  Patient seemed to decline somewhat in the last week of rehab and on 10/26 a urinalysis was done suggestive of urinary tract infection.  Patient was initially treated  with intravenous Rocephin.  On 10/29, patient was to be discharged from rehab but it was determined that the urine culture grew out E. coli that was resistant to Rocephin.  Patient was therefore discharged on Bactrim.  According to the husband, upon arriving home (morning of 10/30) was extremely weak and agitated throughout the day.  Patient refused to take any oral intake whatsoever.  The husband states he was unable to get the Bactrim as a result.  The husband, exasperated that he could not provide for his wife brought the patient back to [facility] emergency department for evaluation.  Upon evaluation in the emergency department, initial labs were suggestive of severe dehydration.  Patient was provided with a 500 cc fluid bolus.  The hospitalist group was called to assess the patient for admission to the hospital.  Review of Systems:   ROS  Past Medical History:  Diagnosis Date  . Anxiety   . Colon polyp 2015  . Elevated serum free T4 level    husband report  . Farsightedness    wears glasses  . Mood change 2021   major change in affect, health issues with multiple ED visits, hospitalization, starting 07/10/2019  . RMSF [facility] spotted fever) 2021  . Urinary tract infection     Past Surgical History:  Procedure Laterality Date  . BREAST REDUCTION SURGERY    . CARPAL TUNNEL RELEASE     right  . COLONOSCOPY  [address]   [place], [place]  . lichenoid keratosis  09/05/11   Skin, left anterior shouldee  . PARTIAL HYSTERECTOMY     still has ovaries  . WISDOM TOOTH EXTRACTION       reports that she has never smoked. She has never used smokeless  tobacco. She reports previous alcohol use of about 5.0 standard drinks of alcohol per week. She reports that she does not use drugs.  Allergies  Allergen Reactions  . Methimazole     Possible cause of transaminitis    Family History  Problem Relation Age of Onset  . Alzheimer's disease Mother   . Other Father        cognitive  decline  . COPD Father        smoker  . Protein S deficiency Other   . Heart disease Neg Hx   . Diabetes Neg Hx   . Cancer Neg Hx   . Stroke Neg Hx   . Hypertension Neg Hx   . Hyperlipidemia Neg Hx      Prior to Admission medications   Medication Sig Start Date End Date Taking? Authorizing Provider  acetaminophen (TYLENOL) 325 MG tablet Take 2 tablets (650 mg total) by mouth every 6 (six) hours as needed for mild pain (or Fever >/= 101). 08/22/19  Yes Elgergawy, Silver Huguenin, MD  diazepam (VALIUM) 5 MG tablet Take 0.5 tablets (2.5 mg total) by mouth every 12 (twelve) hours. 12/03/19  Yes Angiulli, Lavon Paganini, PA-C  omeprazole (PRILOSEC) 40 MG capsule Take 1 capsule (40 mg total) by mouth daily. 12/03/19  Yes Angiulli, Lavon Paganini, PA-C  polyethylene glycol (MIRALAX / GLYCOLAX) 17 g packet Take 17 g by mouth daily. 11/08/19  Yes Amin, Jeanella Flattery, MD  propranolol ER (INDERAL LA) 60 MG 24 hr capsule Take 1 capsule (60 mg total) by mouth daily. 12/03/19 12/02/20 Yes Angiulli, Lavon Paganini, PA-C  sertraline (ZOLOFT) 100 MG tablet Take 1 tablet (100 mg total) by mouth daily. 12/03/19  Yes Angiulli, Lavon Paganini, PA-C  carbidopa-levodopa (SINEMET IR) 25-100 MG tablet Take 1 tablet by mouth 3 (three) times daily. 12/03/19   Angiulli, Lavon Paganini, PA-C  melatonin 3 MG TABS tablet Take 1 tablet (3 mg total) by mouth at bedtime. 12/03/19   Angiulli, Lavon Paganini, PA-C  potassium chloride SA (KLOR-CON) 20 MEQ tablet Take 1 tablet (20 mEq total) by mouth daily. 12/04/19   Angiulli, Lavon Paganini, PA-C  sulfamethoxazole-trimethoprim (BACTRIM DS) 800-160 MG tablet Take 1 tablet by mouth every 12 (twelve) hours. 12/04/19   Angiulli, Lavon Paganini, PA-C  Vitamin D, Ergocalciferol, (DRISDOL) 1.25 MG (50000 UNIT) CAPS capsule Take 1 capsule (50,000 Units total) by mouth every 7 (seven) days. 12/03/19   Cathlyn Parsons, PA-C    Physical Exam: Vitals:   12/04/19 2115 12/04/19 2315 12/04/19 2330 12/05/19 0039  BP: 122/76 118/71 93/80 (!)  108/59  Pulse: 64 73 81 68  Resp:    17  Temp:    97.8 F (36.6 C)  TempSrc:    Oral  SpO2: 100% 100% 100% 100%  Weight:      Height:        Constitutional: Patient is agitated, confused, disoriented and intermittently following commands.   Skin: no rashes, no lesions, poor skin turgor noted. Eyes: Pupils are equally reactive to light.  No evidence of scleral icterus or conjunctival pallor.  ENMT: Dry mucous membranes noted.  Posterior pharynx clear of any exudate or lesions.   Neck: normal, supple, no masses, no thyromegaly.  No evidence of jugular venous distension.   Respiratory: clear to auscultation bilaterally, no wheezing, no crackles. Normal respiratory effort. No accessory muscle use.  Cardiovascular: Regular rate and rhythm, no murmurs / rubs / gallops. No extremity edema. 2+ pedal pulses. No carotid bruits.  Chest:  Nontender without crepitus or deformity.   Back:   Nontender without crepitus or deformity. Abdomen: Abdomen is soft and nontender.  No evidence of intra-abdominal masses.  Positive bowel sounds noted in all quadrants.   Musculoskeletal: No joint deformity upper and lower extremities. Good ROM, no contractures.  Somewhat poor muscle tone. Neurologic: Patient is spontaneously moving all 4 extremities.  Patient is not consistently following commands.  Patient is responsive to verbal and painful stimuli.  Patient does localize to pain.  Psychiatric: Unable to assess due to patient's confused and agitated state.  Patient currently does not seem to possess insight as to her current situation.  Labs on Admission: I have personally reviewed following labs and imaging studies -   CBC: Recent Labs  Lab 11/30/19 1603 12/02/19 0627 12/04/19 1750 12/05/19 0346  WBC 9.5 7.4 10.8* 8.6  NEUTROABS  --   --  7.2 5.4  HGB 15.8* 15.2* 17.4* 14.3  HCT 48.2* 45.5 51.9* 41.3  MCV 100.6* 99.8 99.0 98.1  PLT 334 313 362 683   Basic Metabolic Panel: Recent Labs  Lab  11/30/19 1603 12/02/19 0627 12/03/19 1047 12/04/19 1750  NA 139 140 138 138  K 4.2 3.8 3.7 4.1  CL 111 110 109 107  CO2 16* 16* 15* 15*  GLUCOSE 94 82 78 75  BUN 22 17 15 14   CREATININE 0.73 0.78 0.82 0.80  CALCIUM 9.8 9.5 9.7 10.2   GFR: Estimated Creatinine Clearance: 59.8 mL/min (by C-G formula based on SCr of 0.8 mg/dL). Liver Function Tests: Recent Labs  Lab 11/30/19 1603 12/04/19 1750  AST 32 26  ALT QUANTITY NOT SUFFICIENT, UNABLE TO PERFORM TEST 56*  ALKPHOS 118 108  BILITOT QUANTITY NOT SUFFICIENT, UNABLE TO PERFORM TEST 1.3*  PROT 5.9* 7.0  ALBUMIN 3.7 4.3   No results for input(s): LIPASE, AMYLASE in the last 168 hours. No results for input(s): AMMONIA in the last 168 hours. Coagulation Profile: No results for input(s): INR, PROTIME in the last 168 hours. Cardiac Enzymes: No results for input(s): CKTOTAL, CKMB, CKMBINDEX, TROPONINI in the last 168 hours. BNP (last 3 results) No results for input(s): PROBNP in the last 8760 hours. HbA1C: No results for input(s): HGBA1C in the last 72 hours. CBG: No results for input(s): GLUCAP in the last 168 hours. Lipid Profile: No results for input(s): CHOL, HDL, LDLCALC, TRIG, CHOLHDL, LDLDIRECT in the last 72 hours. Thyroid Function Tests: Recent Labs    12/04/19 2229  TSH 1.701   Anemia Panel: No results for input(s): VITAMINB12, FOLATE, FERRITIN, TIBC, IRON, RETICCTPCT in the last 72 hours. Urine analysis:    Component Value Date/Time   COLORURINE YELLOW 12/04/2019 2012   APPEARANCEUR CLEAR 12/04/2019 2012   LABSPEC 1.015 12/04/2019 2012   LABSPEC 1.015 10/05/2019 0929   PHURINE 5.0 12/04/2019 2012   GLUCOSEU NEGATIVE 12/04/2019 2012   HGBUR NEGATIVE 12/04/2019 2012   BILIRUBINUR NEGATIVE 12/04/2019 2012   BILIRUBINUR negative 10/05/2019 0929   BILIRUBINUR NEG 11/26/2011 0931   KETONESUR 5 (A) 12/04/2019 2012   PROTEINUR NEGATIVE 12/04/2019 2012   UROBILINOGEN negative 11/26/2011 0931   UROBILINOGEN  0.2 03/25/2011 0633   NITRITE NEGATIVE 12/04/2019 2012   LEUKOCYTESUR TRACE (A) 12/04/2019 2012    Radiological Exams on Admission - Personally Reviewed: DG Chest Portable 1 View  Result Date: 12/04/2019 CLINICAL DATA:  Altered mental status. EXAM: PORTABLE CHEST 1 VIEW COMPARISON:  Chest CT 10/26/2019 FINDINGS: The cardiac silhouette, mediastinal and hilar contours are within normal limits and  stable. The lungs are clear. No pleural effusions. No pulmonary lesions. The bony thorax is intact. IMPRESSION: No acute cardiopulmonary findings. Electronically Signed   By: Marijo Sanes M.D.   On: 12/04/2019 21:03     Assessment/Plan Principal Problem:   Failure to thrive in adult   Patient exhibiting progressive failure to thrive in the setting of rapidly progressive dementia of undetermined etiology  Failure to thrive is likely also exacerbated by presence of severe dehydration and E. coli UTI.  We will begin by placing patient on intravenous Zosyn after reviewing E. coli sensitivities on 10/26 to treat urinary tract infection.  We will additionally hydrate patient with intravenous isotonic fluids to address substantial dehydration as evidenced by markedly hemoconcentrated CBC.  Hopefully with treatment of underlying urinary tract infection and dehydration, patient's mentation will improve back to her "new baseline."  Temporarily holding Valium  If patient does not have a response intended, will once again discuss case with neurology to determine if there are any other avenues for diagnosis and treatment for this unfortunate patient.  Ultimately, due to husband's inability to care for the patient patient may require placement in a skilled nursing facility for at least the short-term.  Active Problems:   E-coli UTI   Please see assessment and plan above.    Progressive dementia with uncertain etiology ([place])   Continue trial of Sinemet  Please see assessment and plan above.     Hyperthyroidism  Per review of previous notes, Tapazole was previously held due to elevated LFTs.  Continue regimen of propranolol  Outpatient endocrinology follow-up    GERD without esophagitis    Continue home regimen of PPI   Code Status:  Full code Family Communication: Husband is at bedside who has been updated on plan of care.  Status is: Observation  The patient remains OBS appropriate and will d/c before 2 midnights.  Dispo: The patient is from: Home              Anticipated d/c is to: SNF              Anticipated d/c date is: 2 days              Patient currently is not medically stable to d/c.        Vernelle Emerald MD [facility] Pager [phone]  If 7PM-7AM, please contact night-coverage [email or web address] Use universal [place] password for that web site. If you do not have the password, please call the hospital operator.  12/05/2019, 4:15 AM

## 2019-12-05 NOTE — Consult Note (Signed)
Palliative Medicine Inpatient Consult Note  Reason for consult:  Rapidly progressing dementia  HPI:  Per intake H&P --> by Dr. Vivia Birmingham Jelinski is a "69 year old female with past medical history of hypothyroidism, recent diagnosis of [place] spotted fever (08/2019, S/P Tx) who presented on 9/14 with progressive generalized weakness, falls and confusion over the preceding 3 months.  Patient was hospitalized at [facility] from 10/20/2019 until 11/09/2019.  Patient was evaluated by multiple neurologists and underwent an extensive work-up for her progressive neurologic decline.  Extensive work-up up until the end of hospitalization included: Unremarkable TSH, unremarkable ammonia level, unremarkable B1 level, RMSF borderline positive IgG, negative IgM(she completed 10 day course of doxy in July), negative Lyme, normal ESR, normal thyroglobulin and thyroid peroxidase Ab, negative anti-hu, anti-yo, anti-ri.  Additionally, patient underwent lumbar puncture negative for oligoclonal bands, negative for paraneoplastic CSF panel (LGI1 was not included), negative for neuron-specific enolase, negative for serum anti-LGI 1, negative serum autoimmune encephalopathy panel and negative 14-3-3eta.  With extensive work-up noted above, patient was diagnosed with rapidly progressive dementia of undetermined etiology.  Patient received a course of 5 days of IV steroids during the hospital course and was thought to respond to this.  Patient was also placed on a trial of Sinemet which she is currently on.  Patient was discharged from the inpatient service on 10/4 to inpatient rehab.  Patient strength and physical activity did gradually improve throughout rehab.  Patient seemed to decline somewhat in the last week of rehab and on 10/26 a urinalysis was done suggestive of urinary tract infection.  Patient was initially treated with intravenous Rocephin.  On 10/29, patient was to be discharged from rehab but it  was determined that the urine culture grew out E. coli that was resistant to Rocephin.  Patient was therefore discharged on Bactrim.  According to the husband, upon arriving home (morning of 10/30) was extremely weak and agitated throughout the day.  Patient refused to take any oral intake whatsoever.  The husband states he was unable to get the Bactrim as a result.  The husband, exasperated that he could not provide for his wife brought the patient back to [facility] emergency department for evaluation.  Upon evaluation in the emergency department, initial labs were suggestive of severe dehydration.  Patient was provided with a 500 cc fluid bolus.  The hospitalist group was called to assess the patient for admission to the hospital."  Team has been addressing failure to thrive, dehydration, rapidly progressive dementia of undetermined etiology, acute metabolic encephalopathy, GERD, UTI, and depression in setting of history of RMSF treated with doxycycline 10 day course in July 2021.   Clinical Assessment/Goals of Care: I have reviewed medical records including EPIC notes, labs and imaging, received report from bedside RN, assessed the patient.    I met with patient's husband Vincent Ehrler [phone]) to further discuss diagnosis prognosis, GOC, EOL wishes, disposition and options. Patient was present but not able to participate in conversation. Husband was a previous paramedic and has significant medical knowledge and easily relates his wife's history and recent treatments.    I introduced Palliative Medicine as specialized medical care for people living with serious illness. It focuses on providing relief from the symptoms and stress of a serious illness. The goal is to improve quality of life for both the patient and the family.  A detailed discussion was had today regarding advanced directives.  Concepts specific to code status, artifical feeding and hydration,  continued IV antibiotics  and rehospitalization was had.  The difference between a aggressive medical intervention path  and a palliative comfort care path for this patient at this time was had. Values and goals of care important to patient and family were attempted to be elicited.  [name] and Lorah have been married for 30 years. They do not have children. The patient has sisters who have been kept informed of their sister's health status. Elliette was a Hospital doctor and owned her own business prior to retirement. They live in [place] and have a fixed income as they are both retired. [name] shared that he and his wife had discussed wishes in the past and she would want everything done other than a feeding tube or parenteral nutrition if she could have quality of life. He would like to keep her a full code status at this time. He feels she has been worked up well but would liek to see if anything else comes of her evaluation.  He shared that he thought he could take care of her but the recent discharge was a reality check for him as he could not care for her at home and brought her back to the ED. He is hopeful that she may improve with rehab and be able to come home. He is aware that without adequate nutrition that she can continue to decline. He praised the staff on [facility] for their treatment of [place]ora after the last hospital discharge. He really felt they helped her improve. He would like [place] to go back to inpatient rehab at [facility] at discharge. He is aware that she may need LTC but would like to see if she can progress. He expressed concern that if she is in a SNF that COVID restrictions may prevent him from being present. He is afraid the SNF will not feed her or offer fluids. He knows she cannot feed herself or drink without assistance. He does not feel that Winifred is in pain.  Discussed the importance of continued conversation with family and their  medical providers regarding overall plan of care and treatment options, ensuring  decisions are within the context of the patients values and GOCs.  Decision Maker:husband, Legrand Como  SUMMARY OF RECOMMENDATIONS    Code Status/Advance Care Planning: FULL CODE  Symptom Management:  Depression: sertraline daily Dilerium: haloperidol   Palliative Prophylaxis:   Constipation: senna-docusate, miralax  GERD: pantoprazole  Additional Recommendations (Limitations, Scope, Preferences):  No feeding tube or parenteral nutrition  Continue nutritional supplements  Psycho-social/Spiritual:   Desire for further Chaplaincy support: no  Additional Recommendations: full scope of treatment  Psych consult was ordered by primary team today   Prognosis: Poor with failure to thrive diagnosis  Discharge Planning: Not stable for discharge. Still needs on going work up and PT and OT evaluation with this admission.  Vitals with BMI 12/05/2019 12/05/2019 12/04/2019  Height - - -  Weight - - -  BMI - - -  Systolic 144 818 93  Diastolic 74 59 80  Pulse 80 68 81  Some encounter information is confidential and restricted. Go to Review Flowsheets activity to see all data.    PPS:50%    Thank you for the opportunity to participate in the care of this patient and family.   Time In:9:45 Time Out:11:10 Total Time: >70 minutes Greater than 50%  of this time was spent counseling and coordinating care related to the above assessment and plan.  Lindell Spar, NP [place]  Palliative Medicine Team Team Cell Phone: [phone] Please utilize secure chat with additional questions, if there is no response within 30 minutes please call the above phone number  Palliative Medicine Team providers are available by phone from 7am to 7pm daily and can be reached through the team cell phone.  Should this patient require assistance outside of these hours, please call the patient's attending physician.

## 2019-12-05 NOTE — Plan of Care (Signed)
  Problem: Education: Goal: Knowledge of General Education information will improve Description: Including pain rating scale, medication(s)/side effects and non-pharmacologic comfort measures Outcome: Progressing   Problem: Clinical Measurements: Goal: Ability to maintain clinical measurements within normal limits will improve Outcome: Progressing Goal: Will remain free from infection Outcome: Progressing Goal: Diagnostic test results will improve Outcome: Progressing   Problem: Activity: Goal: Risk for activity intolerance will decrease Outcome: Progressing   Problem: Skin Integrity: Goal: Risk for impaired skin integrity will decrease Outcome: Progressing

## 2019-12-05 NOTE — Progress Notes (Signed)
PROGRESS NOTE    Hannah Padilla  RDE:[id number] DOB: 10-24-1950 DOA: 12/04/2019 PCP: Carlena Hurl, PA-C    Brief Narrative:  Hannah Padilla is a 69 year old female with past medical history remarkable for hypothyroidism, [place] spotted fever July 2021 status post 10-day course doxycycline, and recent diagnosis progressive dementia of uncertain etiology who presented to the ED for altered mental status and weakness.  Patient was discharged from inpatient rehab same day.  Diagnosed with E. coli UTI on 11/30/2019 and was initially placed empirically on ceftriaxone, although cultures were noted to be resistant to ceftriaxone, but she was discharged home on Bactrim in which she did not receive as of yet.  Husband concerned about her rapidly progressive decline with failure to thrive with poor oral intake and not been able to care for her at home.  Recent prolonged hospitalization at [facility] from 10/20/2019 through 11/09/2019 for progressive neurologic decline.  Patient was evaluated by multiple neurologists with an extensive work-up to include unremarkable TSH, unremarkable ammonia level, unremarkable B1 level, RMSF borderline positive IgG, negative IgM (completed 10 day course doxycycline July 2021), negative Lyme, normal ESR, normal thyroglobulin and thyroid peroxidase antibody, negative anti-yu, anti-yo, anti-ri.  LP negative for all, bands, negative for paraneoplastic CSF panel (Lgl1 was not included), negative for neuron-specific enolase, negative for serum anti-LGl1, negative serum autoimmune encephalopathy panel, negative 14-3-3eta.  Neurology deemed this a rapidly progressive dementia of undetermined etiology and she received a 5-day course of IV steroids and was placed on a trial of Sinemet.  In the ED, temperature 98.0, HR 84, RR 15, BP 122/93, SPO2 100% on room air.  Sodium 138, potassium 4.1, chloride 107, CO2 15, glucose 75, BUN 14, creatinine 0.80.  Anion gap 16.  Lactic acid 2.6.  WBC  count 10.8, hemoglobin 17.4, platelets 362.  Urinalysis with trace leukoesterase, negative nitrite, 6-10 WBCs.  SARS-CoV-2/influenza A-B negative.  Chest x-ray with no acute cardiopulmonary disease findings.   Assessment & Plan:   Principal Problem:   Failure to thrive in adult Active Problems:   GERD without esophagitis   E-coli UTI   Hyperthyroidism   Acute metabolic encephalopathy   Progressive dementia with uncertain etiology ([place])   Acute metabolic encephalopathy Patient presenting from home after discharge same day from inpatient rehabilitation for worsening altered mental status.  Patient with recent prolonged hospitalization with diagnosis of rapidly progressive dementia of unclear etiology.  Husband states when patient returned home, she was unable to manage her ADLs and he seems more confused than earlier in the day.  Etiology likely multifactorial with inappropriately treated UTI with multiple antibacterial resistances coupled with her underlying progressive dementia and adult failure to thrive.  Also consideration of possible catatonia. --Continue treatment as depicted below  E. coli urinary tract infection Patient diagnosed with urinary tract infection during recent inpatient rehabilitation stay.  Was initially placed on empiric ceftriaxone with urine culture growing E. coli with resistance pattern to ampicillin/sulbactam, cefazolin, ceftriaxone and ciprofloxacin.  Patient was discharged on Bactrim but did not receive dose prior to returning to the ED.  Repeat urinalysis on arrival notable for trace leukoesterase, negative nitrate, 6-10 WBCs. --Continue Zosyn in accordance with urine culture susceptibilities on 12/01/2019  Rapidly progressive dementia of undetermined etiology Patient with recent prolonged hospitalization from September through October 2021 with extensive work-up with no clear findings.  Was diagnosed in July 2021 with RMSF and completed 10-day course of  doxycycline.  Patient follows with neurology outpatient, Dr. Jannifer Franklin.  --  Sinemet 25-100 mg p.o. 3 times daily --Appears continues to rapidly progress with poor prognosis --Patient with intermittent jerking movements and schizophrenic type behaviors/speech on physical exam today; will consult psychiatry for evaluation of possible underlying catatonia  Adult failure to thrive Dehydration Patient presenting after discharge same day from inpatient rehab with continued weakness, fatigue and altered mental status.  Patient was significantly dehydrated on initial physical exam with an elevated lactic acid of 2.6.  Husband reports that her she has not been eating or drinking well, and this is a highly concerning factor.  He states she would never want a feeding tube placed, but otherwise would want all aggressive care if she was able to achieve a good quality of life.  Discussed with husband extensively that if she did not improve her oral intake, the likelihood of returning to a more normal "stage" with a good quality of life is very unlikely.  He also stated that she has tried Marinol and Megace in the past with no significant improvement of symptoms. --Palliative care consultation for goals of care and medical decision making  GERD: Protonix 40 mg p.o. daily  Hx [place] spotted fever Completed 10-day course of doxycycline on July 2021.  Depression: Sertraline 100 mg p.o. daily   DVT prophylaxis: Lovenox Code Status: Full code Family Communication: Updated patient's husband extensively at bedside  Disposition Plan:  Status is: Inpatient  Remains inpatient appropriate because:Altered mental status, Ongoing diagnostic testing needed not appropriate for outpatient work up, Unsafe d/c plan, IV treatments appropriate due to intensity of illness or inability to take PO and Inpatient level of care appropriate due to severity of illness   Dispo: The patient is from: Home              Anticipated  d/c is to: SNF              Anticipated d/c date is: > 3 days              Patient currently is not medically stable to d/c.   Consultants:   Palliative care  Psychiatry  Procedures:   None  Antimicrobials:   Zosyn 10/29>>   Subjective: Patient seen and examined at bedside, resting comfortably in bed.  Husband attempting to feed patient breakfast this morning.  When asked patient this morning how she is feeling, she responds "they are coming to get [place]" and a very soft voice.  Also noted to have intermittent jerking motions.  Given her rapid decline, possible concern of catatonia I will have psychiatry evaluate.  Discussed with husband extensively at bedside, given her rapid decline, failure to thrive this is a very poor prognosis.  He states she would decline a feeding tube; but would want all aggressive measures if she were able to achieve a good quality of living.  Objective: Vitals:   12/04/19 2315 12/04/19 2330 12/05/19 0039 12/05/19 0811  BP: 118/71 93/80 (!) 108/59 124/74  Pulse: 73 81 68 80  Resp:   17 17  Temp:   97.8 F (36.6 C) 98.3 F (36.8 C)  TempSrc:   Oral Oral  SpO2: 100% 100% 100% 100%  Weight:      Height:        Intake/Output Summary (Last 24 hours) at 12/05/2019 1327 Last data filed at 12/05/2019 0900 Gross per 24 hour  Intake 137.21 ml  Output --  Net 137.21 ml   Filed Weights   12/04/19 1723  Weight: 56.3 kg  Examination:  General exam: Appears calm and comfortable, confused HEENT: NCAT, PERRL, Dry mucous membranes Respiratory system: Clear to auscultation. Respiratory effort normal.  Oxygenating well on room air Cardiovascular system: S1 & S2 heard, RRR. No JVD, murmurs, rubs, gallops or clicks. No pedal edema. Gastrointestinal system: Abdomen is nondistended, soft and nontender. No organomegaly or masses felt. Normal bowel sounds heard. Central nervous system: Alert, not oriented to person/place/time/situation. No focal neurological  deficits.  Extremities: Symmetric 5 x 5 power. Skin: No rashes, lesions or ulcers Psychiatry: Judgement and insight appear poor. Depressed mood & flat affect.  Poor eye contact, speech with normal rate but decreased tone, poor concentration and attention.    Data Reviewed: I have personally reviewed following labs and imaging studies  CBC: Recent Labs  Lab 11/30/19 1603 12/02/19 0627 12/04/19 1750 12/05/19 0346  WBC 9.5 7.4 10.8* 8.6  NEUTROABS  --   --  7.2 5.4  HGB 15.8* 15.2* 17.4* 14.3  HCT 48.2* 45.5 51.9* 41.3  MCV 100.6* 99.8 99.0 98.1  PLT 334 313 362 250   Basic Metabolic Panel: Recent Labs  Lab 11/30/19 1603 12/02/19 0627 12/03/19 1047 12/04/19 1750 12/05/19 0346  NA 139 140 138 138 138  K 4.2 3.8 3.7 4.1 3.5  CL 111 110 109 107 112*  CO2 16* 16* 15* 15* 14*  GLUCOSE 94 82 78 75 78  BUN $Re'22 17 15 14 13  'Oku$ CREATININE 0.73 0.78 0.82 0.80 0.81  CALCIUM 9.8 9.5 9.7 10.2 9.3  MG  --   --   --   --  2.1   GFR: Estimated Creatinine Clearance: 59.1 mL/min (by C-G formula based on SCr of 0.81 mg/dL). Liver Function Tests: Recent Labs  Lab 11/30/19 1603 12/04/19 1750 12/05/19 0346  AST 32 26 18  ALT QUANTITY NOT SUFFICIENT, UNABLE TO PERFORM TEST 56* 10  ALKPHOS 118 108 77  BILITOT QUANTITY NOT SUFFICIENT, UNABLE TO PERFORM TEST 1.3* 1.3*  PROT 5.9* 7.0 5.7*  ALBUMIN 3.7 4.3 3.4*   No results for input(s): LIPASE, AMYLASE in the last 168 hours. No results for input(s): AMMONIA in the last 168 hours. Coagulation Profile: No results for input(s): INR, PROTIME in the last 168 hours. Cardiac Enzymes: No results for input(s): CKTOTAL, CKMB, CKMBINDEX, TROPONINI in the last 168 hours. BNP (last 3 results) No results for input(s): PROBNP in the last 8760 hours. HbA1C: No results for input(s): HGBA1C in the last 72 hours. CBG: No results for input(s): GLUCAP in the last 168 hours. Lipid Profile: No results for input(s): CHOL, HDL, LDLCALC, TRIG, CHOLHDL,  LDLDIRECT in the last 72 hours. Thyroid Function Tests: Recent Labs    12/04/19 2229  TSH 1.701   Anemia Panel: No results for input(s): VITAMINB12, FOLATE, FERRITIN, TIBC, IRON, RETICCTPCT in the last 72 hours. Sepsis Labs: Recent Labs  Lab 12/04/19 1750 12/04/19 2236  LATICACIDVEN 2.6* 1.2    Recent Results (from the past 240 hour(s))  Culture, Urine     Status: Abnormal   Collection Time: 12/01/19  6:16 PM   Specimen: Urine, Clean Catch  Result Value Ref Range Status   Specimen Description URINE, CLEAN CATCH  Final   Special Requests   Final    NONE Performed at [facility], [place] [address] [place], [place] [address]    Culture (A)  Final    >=100,000 COLONIES/mL ESCHERICHIA COLI Confirmed Extended Spectrum Beta-Lactamase Producer (ESBL).  In bloodstream infections from ESBL organisms, carbapenems are preferred over piperacillin/tazobactam.  They are shown to have a lower risk of mortality.    Report Status 12/04/2019 FINAL  Final   Organism ID, Bacteria ESCHERICHIA COLI (A)  Final      Susceptibility   Escherichia coli - MIC*    AMPICILLIN >=32 RESISTANT Resistant     CEFAZOLIN >=64 RESISTANT Resistant     CEFTRIAXONE >=64 RESISTANT Resistant     CIPROFLOXACIN >=4 RESISTANT Resistant     GENTAMICIN <=1 SENSITIVE Sensitive     IMIPENEM <=0.25 SENSITIVE Sensitive     NITROFURANTOIN <=16 SENSITIVE Sensitive     TRIMETH/SULFA <=20 SENSITIVE Sensitive     AMPICILLIN/SULBACTAM >=32 RESISTANT Resistant     PIP/TAZO <=4 SENSITIVE Sensitive     * >=100,000 COLONIES/mL ESCHERICHIA COLI  Respiratory Panel by RT PCR (Flu A&B, Covid) - Nasopharyngeal Swab     Status: None   Collection Time: 12/04/19 10:25 PM   Specimen: Nasopharyngeal Swab  Result Value Ref Range Status   SARS Coronavirus 2 by RT PCR NEGATIVE NEGATIVE Final    Comment: (NOTE) SARS-CoV-2 target nucleic acids are NOT DETECTED.  The SARS-CoV-2 RNA is generally detectable in upper respiratoy specimens  during the acute phase of infection. The lowest concentration of SARS-CoV-2 viral copies this assay can detect is 131 copies/mL. A negative result does not preclude SARS-Cov-2 infection and should not be used as the sole basis for treatment or other patient management decisions. A negative result may occur with  improper specimen collection/handling, submission of specimen other than nasopharyngeal swab, presence of viral mutation(s) within the areas targeted by this assay, and inadequate number of viral copies (<131 copies/mL). A negative result must be combined with clinical observations, patient history, and epidemiological information. The expected result is Negative.  Fact Sheet for Patients:  PinkCheek.be  Fact Sheet for Healthcare Providers:  GravelBags.it  This test is no t yet approved or cleared by the [place] FDA and  has been authorized for detection and/or diagnosis of SARS-CoV-2 by FDA under an Emergency Use Authorization (EUA). This EUA will remain  in effect (meaning this test can be used) for the duration of the COVID-19 declaration under Section 564(b)(1) of the Act, 21 U.S.C. section 360bbb-3(b)(1), unless the authorization is terminated or revoked sooner.     Influenza A by PCR NEGATIVE NEGATIVE Final   Influenza B by PCR NEGATIVE NEGATIVE Final    Comment: (NOTE) The Xpert Xpress SARS-CoV-2/FLU/RSV assay is intended as an aid in  the diagnosis of influenza from Nasopharyngeal swab specimens and  should not be used as a sole basis for treatment. Nasal washings and  aspirates are unacceptable for Xpert Xpress SARS-CoV-2/FLU/RSV  testing.  Fact Sheet for Patients: PinkCheek.be  Fact Sheet for Healthcare Providers: GravelBags.it  This test is not yet approved or cleared by the [place] FDA and  has been authorized for detection and/or  diagnosis of SARS-CoV-2 by  FDA under an Emergency Use Authorization (EUA). This EUA will remain  in effect (meaning this test can be used) for the duration of the  Covid-19 declaration under Section 564(b)(1) of the Act, 21  U.S.C. section 360bbb-3(b)(1), unless the authorization is  terminated or revoked. Performed at [facility], [place] [address], [place], [place] [address]          Radiology Studies: DG Chest Portable 1 View  Result Date: 12/04/2019 CLINICAL DATA:  Altered mental status. EXAM: PORTABLE CHEST 1 VIEW COMPARISON:  Chest CT 10/26/2019 FINDINGS: The cardiac silhouette, mediastinal and hilar  contours are within normal limits and stable. The lungs are clear. No pleural effusions. No pulmonary lesions. The bony thorax is intact. IMPRESSION: No acute cardiopulmonary findings. Electronically Signed   By: Marijo Sanes M.D.   On: 12/04/2019 21:03        Scheduled Meds: . carbidopa-levodopa  1 tablet Oral TID  . enoxaparin (LOVENOX) injection  40 mg Subcutaneous Q24H  . feeding supplement  237 mL Oral BID BM  . haloperidol lactate  2 mg Intravenous Once  . pantoprazole  40 mg Oral Daily  . propranolol ER  60 mg Oral Daily  . senna-docusate  2 tablet Oral BID  . sertraline  100 mg Oral Daily   Continuous Infusions: . sodium chloride 125 mL/hr at 12/05/19 1100  . piperacillin-tazobactam (ZOSYN)  IV 3.375 g (12/05/19 0818)     LOS: 0 days    Time spent: 40 minutes spent on chart review, discussion with nursing staff, consultants, updating family and interview/physical exam; more than 50% of that time was spent in counseling and/or coordination of care.    Arbie Blankley J [place] DO [facility] Available via Epic secure chat 7am-7pm After these hours, please refer to coverage provider listed on [email or web address] 12/05/2019, 1:27 PM

## 2019-12-05 NOTE — Plan of Care (Signed)
  Problem: Health Behavior/Discharge Planning: Goal: Ability to manage health-related needs will improve Outcome: Progressing   Problem: Coping: Goal: Level of anxiety will decrease Outcome: Progressing   Problem: Safety: Goal: Ability to remain free from injury will improve Outcome: Progressing   Problem: Skin Integrity: Goal: Risk for impaired skin integrity will decrease Outcome: Progressing   

## 2019-12-06 LAB — CBC
HCT: 41 % (ref 36.0–46.0)
Hemoglobin: 14.4 g/dL (ref 12.0–15.0)
MCH: 33 pg (ref 26.0–34.0)
MCHC: 35.1 g/dL (ref 30.0–36.0)
MCV: 94 fL (ref 80.0–100.0)
Platelets: 266 10*3/uL (ref 150–400)
RBC: 4.36 MIL/uL (ref 3.87–5.11)
RDW: 13.4 % (ref 11.5–15.5)
WBC: 8.2 10*3/uL (ref 4.0–10.5)
nRBC: 0 % (ref 0.0–0.2)

## 2019-12-06 LAB — COMPREHENSIVE METABOLIC PANEL
ALT: 13 U/L (ref 0–44)
AST: 19 U/L (ref 15–41)
Albumin: 3.3 g/dL — ABNORMAL LOW (ref 3.5–5.0)
Alkaline Phosphatase: 77 U/L (ref 38–126)
Anion gap: 13 (ref 5–15)
BUN: 6 mg/dL — ABNORMAL LOW (ref 8–23)
CO2: 16 mmol/L — ABNORMAL LOW (ref 22–32)
Calcium: 9.1 mg/dL (ref 8.9–10.3)
Chloride: 111 mmol/L (ref 98–111)
Creatinine, Ser: 0.66 mg/dL (ref 0.44–1.00)
GFR, Estimated: >60 mL/min (ref >60)
Glucose, Bld: 111 mg/dL — ABNORMAL HIGH (ref 70–99)
Potassium: 3.2 mmol/L — ABNORMAL LOW (ref 3.5–5.1)
Sodium: 140 mmol/L (ref 135–145)
Total Bilirubin: 1.2 mg/dL (ref 0.3–1.2)
Total Protein: 5.4 g/dL — ABNORMAL LOW (ref 6.5–8.1)

## 2019-12-06 LAB — LACTIC ACID, PLASMA: Lactic Acid, Venous: 0.8 mmol/L (ref 0.5–1.9)

## 2019-12-06 LAB — MAGNESIUM: Magnesium: 2 mg/dL (ref 1.7–2.4)

## 2019-12-06 MED ORDER — PROSOURCE PLUS PO LIQD
30.0000 mL | Freq: Two times a day (BID) | ORAL | Status: DC
  Administered 2019-12-06 – 2019-12-08 (×6): 30 mL via ORAL
  Filled 2019-12-06 (×5): qty 30

## 2019-12-06 MED ORDER — ENSURE ENLIVE PO LIQD
237.0000 mL | Freq: Two times a day (BID) | ORAL | Status: DC
  Administered 2019-12-07 – 2019-12-08 (×4): 237 mL via ORAL

## 2019-12-06 MED ORDER — DEXTROSE-NACL 5-0.9 % IV SOLN: INTRAVENOUS | Status: DC

## 2019-12-06 MED ORDER — LORAZEPAM 2 MG/ML IJ SOLN
0.5000 mg | Freq: Four times a day (QID) | INTRAMUSCULAR | Status: DC | PRN
  Administered 2019-12-06 – 2019-12-07 (×3): 0.5 mg via INTRAVENOUS
  Filled 2019-12-06 (×3): qty 1

## 2019-12-06 MED ORDER — ADULT MULTIVITAMIN W/MINERALS CH
1.0000 | ORAL_TABLET | Freq: Every day | ORAL | Status: DC
  Administered 2019-12-06 – 2019-12-08 (×3): 1 via ORAL
  Filled 2019-12-06 (×3): qty 1

## 2019-12-06 NOTE — Progress Notes (Signed)
Pt is very confused, disoriented getting agitated, shaky. Dr [place] notified. Ativan prn given. Pt's family at the bedside.

## 2019-12-06 NOTE — Consult Note (Signed)
[facility] Consult   Reason for Consult:  ''Please evaluate for possible catatonia leading to neurological decline''  Referring Physician: [place] Eric, DO Patient Identification: Hannah Padilla MRN:  [id number] Principal Diagnosis: Failure to thrive in adult Diagnosis:  Principal Problem:   Failure to thrive in adult Active Problems:   GERD without esophagitis   E-coli UTI   Hyperthyroidism   Acute metabolic encephalopathy   Progressive dementia with uncertain etiology ([place])   Total Time spent with patient: 1 hour  Subjective:   Hannah Padilla is a 69 y.o. female patient admitted with altered mental status and generalize weakness  HPI: Patient unable to participate in psychiatric assessment, she is unable to communicate. However, history is obtained from her husband who is at her bedside. Per chart review and husband reports, patient has  medical history remarkable for hypothyroidism, [facility] spotted fever July 2021 and recent diagnosis progressive dementia of uncertain etiology. Husband reports that patient was admitted to the hospital due to altered mental status and weakness. However, he reports that his wife was perfectly well until July of this year when she was diagnosed with [facility] spotted fever which has been complicated with other medical issues even though she had a course 10 days antibiotics recommended. Husband denies his wife having prior history of mental illness but says she has [facility] following her current multiple medical issues. She has had a trial of Seroquel, Haldol and low dose Valium. He reports that patient had bad reactions to Seroquel but doing fairly alright on Sertraline. Husband reports that patient stopped communicating with poor oral intake more than a week ago. 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 Anxiety   . Colon polyp 2015  . Elevated serum free T4 level    husband report  . Farsightedness    wears glasses  . Mood change 2021   major change in affect, health issues with multiple ED visits, hospitalization, starting 07/10/2019  . [facility] spotted fever) 2021  . Urinary tract infection     Past Surgical History:  Procedure Laterality Date  . BREAST REDUCTION SURGERY    . CARPAL TUNNEL RELEASE     right  . COLONOSCOPY  [address]   [place], [place]  . lichenoid keratosis  09/05/11   Skin, left anterior shouldee  . PARTIAL HYSTERECTOMY     still has ovaries  . WISDOM TOOTH EXTRACTION     Family History:  Family History  Problem Relation Age of Onset  . Alzheimer's disease Mother   . Other Father        cognitive decline  . COPD Father        smoker  . Protein S deficiency Other   . Heart disease Neg Hx   . Diabetes Neg Hx   . Cancer Neg Hx   . Stroke Neg Hx   . Hypertension Neg Hx   . Hyperlipidemia Neg Hx    Family Psychiatric  History:  Social History:  Social History   Substance and Sexual Activity  Alcohol Use Not Currently  . Alcohol/week: 5.0 standard drinks  . Types: 5 Glasses of wine per week   Comment: none in a  year (as of 10/08/19)     Social History   Substance and Sexual Activity  Drug Use No    Social History   Socioeconomic History  . Marital status: Married    Spouse name: Not on file  . Number of children: Not on file  . Years of education: Not on file  . Highest education level: Not on file  Occupational History  . Not on  file  Tobacco Use  . Smoking status: Never Smoker  . Smokeless tobacco: Never Used  Vaping Use  . Vaping Use: Never used  Substance and Sexual Activity  . Alcohol use: Not Currently    Alcohol/week: 5.0 standard drinks    Types: 5 Glasses of wine per week    Comment: none in a  year (as of 10/08/19)  . Drug use: No  . Sexual activity: Not Currently    Birth control/protection: Post-menopausal    Comment: 1st intercourse 69 yo-More than 5 partners  Other Topics Concern  . Not on file  Social History Narrative   Married, no children.  Retired, was working for [employer] as a Geophysicist/field seismologist.   Exercise - not much.   07/2018.        Right handed   Lives at home with husband    Social Determinants of Health   Financial Resource Strain:   . Difficulty of Paying Living Expenses: Not on file  Food Insecurity:   . Worried About Charity fundraiser in the Last Year: Not on file  . Ran Out of Food in the Last Year: Not on file  Transportation Needs:   . Lack of Transportation (Medical): Not on file  . Lack of Transportation (Non-Medical): Not on file  Physical Activity:   . Days of Exercise per Week: Not on file  . Minutes of Exercise per Session: Not on file  Stress:   . Feeling of Stress : Not on file  Social Connections:   . Frequency of Communication with Friends and Family: Not on file  . Frequency of Social Gatherings with Friends and Family: Not on file  . Attends Religious Services: Not on file  . Active Member of Clubs or Organizations: Not on file  . Attends Archivist Meetings: Not on file  . Marital Status: Not on file   Additional Social History:    Allergies:   Allergies  Allergen Reactions  . Methimazole     Possible cause of transaminitis    Labs:  Results for orders placed or performed during the hospital encounter of 12/04/19 (from the past 48 hour(s))  CBC with Differential     Status: Abnormal   Collection Time: 12/04/19  5:50 PM  Result Value  Ref Range   WBC 10.8 (H) 4.0 - 10.5 K/uL   RBC 5.24 (H) 3.87 - 5.11 MIL/uL   Hemoglobin 17.4 (H) 12.0 - 15.0 g/dL   HCT 51.9 (H) 36 - 46 %   MCV 99.0 80.0 - 100.0 fL   MCH 33.2 26.0 - 34.0 pg   MCHC 33.5 30.0 - 36.0 g/dL   RDW 13.7 11.5 - 15.5 %   Platelets 362 150 - 400 K/uL   nRBC 0.0 0.0 - 0.2 %   Neutrophils Relative % 66 %  Neutro Abs 7.2 1.7 - 7.7 K/uL   Lymphocytes Relative 23 %   Lymphs Abs 2.5 0.7 - 4.0 K/uL   Monocytes Relative 7 %   Monocytes Absolute 0.7 0.1 - 1.0 K/uL   Eosinophils Relative 2 %   Eosinophils Absolute 0.3 0.0 - 0.5 K/uL   Basophils Relative 1 %   Basophils Absolute 0.1 0.0 - 0.1 K/uL   Immature Granulocytes 1 %   Abs Immature Granulocytes 0.08 (H) 0.00 - 0.07 K/uL    Comment: Performed at [facility] [address] [place], [place] [address]  Comprehensive metabolic panel     Status: Abnormal   Collection Time: 12/04/19  5:50 PM  Result Value Ref Range   Sodium 138 135 - 145 mmol/L   Potassium 4.1 3.5 - 5.1 mmol/L   Chloride 107 98 - 111 mmol/L   CO2 15 (L) 22 - 32 mmol/L   Glucose, Bld 75 70 - 99 mg/dL    Comment: Glucose reference range applies only to samples taken after fasting for at least 8 hours.   BUN 14 8 - 23 mg/dL   Creatinine, Ser 0.80 0.44 - 1.00 mg/dL   Calcium 10.2 8.9 - 10.3 mg/dL   Total Protein 7.0 6.5 - 8.1 g/dL   Albumin 4.3 3.5 - 5.0 g/dL   AST 26 15 - 41 U/L   ALT 56 (H) 0 - 44 U/L   Alkaline Phosphatase 108 38 - 126 U/L   Total Bilirubin 1.3 (H) 0.3 - 1.2 mg/dL   GFR, Estimated >60 >60 mL/min    Comment: (NOTE) Calculated using the CKD-EPI Creatinine Equation (2021)    Anion gap 16 (H) 5 - 15    Comment: Performed at [facility], [place] [address] [place], [place] [address]  Lactic acid, plasma     Status: Abnormal   Collection Time: 12/04/19  5:50 PM  Result Value Ref Range   Lactic Acid, Venous 2.6 (HH) 0.5 - 1.9 mmol/L    Comment: CRITICAL RESULT CALLED TO, READ BACK BY AND VERIFIED WITH: S.Lacinda Axon  RN [id number] 12/04/19 MCCORMICK K Performed at [facility], [place] [address] [place], [place] [address]   Urinalysis, Routine w reflex microscopic Urine, Catheterized     Status: Abnormal   Collection Time: 12/04/19  8:12 PM  Result Value Ref Range   Color, Urine YELLOW YELLOW   APPearance CLEAR CLEAR   Specific Gravity, Urine 1.015 1.005 - 1.030   pH 5.0 5.0 - 8.0   Glucose, UA NEGATIVE NEGATIVE mg/dL   Hgb urine dipstick NEGATIVE NEGATIVE   Bilirubin Urine NEGATIVE NEGATIVE   Ketones, ur 5 (A) NEGATIVE mg/dL   Protein, ur NEGATIVE NEGATIVE mg/dL   Nitrite NEGATIVE NEGATIVE   Leukocytes,Ua TRACE (A) NEGATIVE   RBC / HPF 0-5 0 - 5 RBC/hpf   WBC, UA 6-10 0 - 5 WBC/hpf   Bacteria, UA NONE SEEN NONE SEEN   Mucus PRESENT     Comment: Performed at [facility], [place] [address] [place], [place] [address]  Respiratory Panel by RT PCR (Flu A&B, Covid) - Nasopharyngeal Swab     Status: None   Collection Time: 12/04/19 10:25 PM   Specimen: Nasopharyngeal Swab  Result Value Ref Range   SARS Coronavirus 2 by RT PCR NEGATIVE NEGATIVE    Comment: (NOTE) SARS-CoV-2 target nucleic acids are NOT DETECTED.  The SARS-CoV-2 RNA is generally detectable in upper respiratoy specimens during the acute phase of  infection. The lowest concentration of SARS-CoV-2 viral copies this assay can detect is 131 copies/mL. A negative result does not preclude SARS-Cov-2 infection and should not be used as the sole basis for treatment or other patient management decisions. A negative result may occur with  improper specimen collection/handling, submission of specimen other than nasopharyngeal swab, presence of viral mutation(s) within the areas targeted by this assay, and inadequate number of viral copies (<131 copies/mL). A negative result must be combined with clinical observations, patient history, and epidemiological information. The expected result is Negative.  Fact Sheet for Patients:   PinkCheek.be  Fact Sheet for Healthcare Providers:  GravelBags.it  This test is no t yet approved or cleared by the [place] FDA and  has been authorized for detection and/or diagnosis of SARS-CoV-2 by FDA under an Emergency Use Authorization (EUA). This EUA will remain  in effect (meaning this test can be used) for the duration of the COVID-19 declaration under Section 564(b)(1) of the Act, 21 U.S.C. section 360bbb-3(b)(1), unless the authorization is terminated or revoked sooner.     Influenza A by PCR NEGATIVE NEGATIVE   Influenza B by PCR NEGATIVE NEGATIVE    Comment: (NOTE) The Xpert Xpress SARS-CoV-2/FLU/RSV assay is intended as an aid in  the diagnosis of influenza from Nasopharyngeal swab specimens and  should not be used as a sole basis for treatment. Nasal washings and  aspirates are unacceptable for Xpert Xpress SARS-CoV-2/FLU/RSV  testing.  Fact Sheet for Patients: PinkCheek.be  Fact Sheet for Healthcare Providers: GravelBags.it  This test is not yet approved or cleared by the [place] FDA and  has been authorized for detection and/or diagnosis of SARS-CoV-2 by  FDA under an Emergency Use Authorization (EUA). This EUA will remain  in effect (meaning this test can be used) for the duration of the  Covid-19 declaration under Section 564(b)(1) of the Act, 21  U.S.C. section 360bbb-3(b)(1), unless the authorization is  terminated or revoked. Performed at [facility], [place] [address], [place], [place] [address]   TSH     Status: None   Collection Time: 12/04/19 10:29 PM  Result Value Ref Range   TSH 1.701 0.350 - 4.500 uIU/mL    Comment: Performed by a 3rd Generation assay with a functional sensitivity of <=0.01 uIU/mL. Performed at [facility], [place] [address] [place], [place] [address]   Lactic acid, plasma     Status: None    Collection Time: 12/04/19 10:36 PM  Result Value Ref Range   Lactic Acid, Venous 1.2 0.5 - 1.9 mmol/L    Comment: Performed at [facility] [address] [place], [place] [address]  C-reactive protein     Status: None   Collection Time: 12/05/19  3:46 AM  Result Value Ref Range   CRP <0.5 <1.0 mg/dL    Comment: Performed at [facility] [address], [place], [place] [address]  Sedimentation rate     Status: None   Collection Time: 12/05/19  3:46 AM  Result Value Ref Range   Sed Rate 1 0 - 22 mm/hr    Comment: Performed at [facility] [address] [place], [place] [address]  CBC WITH DIFFERENTIAL     Status: Abnormal   Collection Time: 12/05/19  3:46 AM  Result Value Ref Range   WBC 8.6 4.0 - 10.5 K/uL   RBC 4.21 3.87 - 5.11 MIL/uL   Hemoglobin 14.3 12.0 - 15.0 g/dL  HCT 41.3 36 - 46 %   MCV 98.1 80.0 - 100.0 fL   MCH 34.0 26.0 - 34.0 pg   MCHC 34.6 30.0 - 36.0 g/dL   RDW 13.8 11.5 - 15.5 %   Platelets 257 150 - 400 K/uL   nRBC 0.0 0.0 - 0.2 %   Neutrophils Relative % 62 %   Neutro Abs 5.4 1.7 - 7.7 K/uL   Lymphocytes Relative 25 %   Lymphs Abs 2.1 0.7 - 4.0 K/uL   Monocytes Relative 8 %   Monocytes Absolute 0.7 0.1 - 1.0 K/uL   Eosinophils Relative 3 %   Eosinophils Absolute 0.2 0.0 - 0.5 K/uL   Basophils Relative 1 %   Basophils Absolute 0.1 0.0 - 0.1 K/uL   Immature Granulocytes 1 %   Abs Immature Granulocytes 0.09 (H) 0.00 - 0.07 K/uL    Comment: Performed at [facility] [address], [place], [place] [address]  Comprehensive metabolic panel     Status: Abnormal   Collection Time: 12/05/19  3:46 AM  Result Value Ref Range   Sodium 138 135 - 145 mmol/L   Potassium 3.5 3.5 - 5.1 mmol/L   Chloride 112 (H) 98 - 111 mmol/L   CO2 14 (L) 22 - 32 mmol/L   Glucose, Bld 78 70 - 99 mg/dL    Comment: Glucose reference range applies only to samples taken after fasting for at least 8 hours.   BUN 13 8 - 23 mg/dL   Creatinine, Ser 0.81  0.44 - 1.00 mg/dL   Calcium 9.3 8.9 - 10.3 mg/dL   Total Protein 5.7 (L) 6.5 - 8.1 g/dL   Albumin 3.4 (L) 3.5 - 5.0 g/dL   AST 18 15 - 41 U/L   ALT 10 0 - 44 U/L   Alkaline Phosphatase 77 38 - 126 U/L   Total Bilirubin 1.3 (H) 0.3 - 1.2 mg/dL   GFR, Estimated >60 >60 mL/min    Comment: (NOTE) Calculated using the CKD-EPI Creatinine Equation (2021)    Anion gap 12 5 - 15    Comment: Performed at [facility], [place] [address] [place], [place] [address]  Magnesium     Status: None   Collection Time: 12/05/19  3:46 AM  Result Value Ref Range   Magnesium 2.1 1.7 - 2.4 mg/dL    Comment: Performed at [facility] [address], [place], [place] [address]  Lactic acid, plasma     Status: None   Collection Time: 12/06/19 11:45 AM  Result Value Ref Range   Lactic Acid, Venous 0.8 0.5 - 1.9 mmol/L    Comment: Performed at [facility] [address] [place],  [address]    Current Facility-Administered Medications  Medication Dose Route Frequency Provider Last Rate Last Admin  . (feeding supplement) PROSource Plus liquid 30 mL  30 mL Oral BID BM [place] Eric J, DO   30 mL at 12/06/19 1520  . carbidopa-levodopa (SINEMET IR) 25-100 MG per tablet immediate release 1 tablet  1 tablet Oral TID Vernelle Emerald, MD   1 tablet at 12/06/19 1518  . dextrose 5 %-0.9 % sodium chloride infusion   Intravenous Continuous [place] Eric J, DO 100 mL/hr at 12/06/19 1115 New Bag at 12/06/19 1115  . enoxaparin (LOVENOX) injection 40 mg  40 mg Subcutaneous Q24H Shalhoub, Sherryll Burger, MD   40 mg at 12/05/19 2359  . feeding supplement (ENSURE ENLIVE / ENSURE PLUS) liquid  237 mL  237 mL Oral BID BM [place] Eric J, DO      . haloperidol lactate (HALDOL) injection 2 mg  2 mg Intravenous Once Blount, Xenia T, NP      . LORazepam (ATIVAN) injection 0.5 mg  0.5 mg Intravenous Q6H PRN [place] Eric J, DO      . multivitamin with minerals tablet 1 tablet  1 tablet Oral Daily [place] Donnamarie Poag,  DO   1 tablet at 12/06/19 1112  . pantoprazole (PROTONIX) EC tablet 40 mg  40 mg Oral Daily Shalhoub, Sherryll Burger, MD   40 mg at 12/06/19 8315  . piperacillin-tazobactam (ZOSYN) IVPB 3.375 g  3.375 g Intravenous Q8H Bertis Ruddy, RPH 12.5 mL/hr at 12/06/19 1521 3.375 g at 12/06/19 1521  . polyethylene glycol (MIRALAX / GLYCOLAX) packet 17 g  17 g Oral Daily PRN [place] Eric J, DO      . propranolol ER (INDERAL LA) 24 hr capsule 60 mg  60 mg Oral Daily Shalhoub, Sherryll Burger, MD   60 mg at 12/06/19 1761  . senna-docusate (Senokot-S) tablet 2 tablet  2 tablet Oral BID [place] Eric J, DO   2 tablet at 12/06/19 6073  . sertraline (ZOLOFT) tablet 100 mg  100 mg Oral Daily Shalhoub, Sherryll Burger, MD   100 mg at 12/06/19 7106    Musculoskeletal: Strength & Muscle Tone: not tested Gait & Station: not tested Patient leans: N/A  Psychiatric Specialty Exam: Physical Exam Psychiatric:        Mood and Affect: Affect is flat.        Speech: She is noncommunicative.        Behavior: Behavior is withdrawn.     Review of Systems  Blood pressure 132/86, pulse 66, temperature 98.2 F (36.8 C), temperature source Oral, resp. rate 18, height 5\' 7"  (1.702 m), weight 56.3 kg, SpO2 99 %.Body mass index is 19.44 kg/m.  General Appearance: Casual  Eye Contact:  Absent  Speech:  Blocked  Volume:  non-communicative  Mood:  Dysphoric  Affect:  Flat  Thought Process: unable to assess-patient is non-communicative  Orientation:  Other:  unable to assess-patient is non-communicative  Thought Content:  unable to assess-patient is non-communicative  Suicidal Thoughts:  unable to assess-patient is non-communicative  Homicidal Thoughts:  unable to assess-patient is non-communicative  Memory:  unable to assess-patient is non-communicative  Judgement:  Other:  unable to assess-patient is non-communicative  Insight:  unable to assess-patient is non-communicative  Psychomotor Activity:  unable to assess-patient is  non-communicative  Concentration:  unable to assess-patient is non-communicative  Recall:  unable to assess-patient is non-communicative  Fund of Knowledge:  unable to assess-patient is non-communicative  Language:  unable to assess-patient is non-communicative  Akathisia:  unable to assess-patient is non-communicative  Handed:  Right  AIMS (if indicated):     Assets:  Financial Resources/Insurance Housing Others:  family support  ADL's:  Impaired  Cognition:  Impaired,  Moderate  Sleep:        Treatment Plan Summary: 69 year old female with no previous history of mental illness who was diagnosed with [facility] spotted fever July 2021 status post 10- day course of Doxycycline. Since then, patient has developed recurrent UTIs, progressive Dementia of undetermined etiology, failure to thrive and poor oral intake. Patient has been evaluated by a psychiatrist at [facility] who recommended Sertraline and low dose benzodiazepine for depression and anxiety respectively. Today, patient is alert, calm, quiet, cooperative but non-verbal. Her  symptoms today does not fit a perfect picture of Catatonia but more of an adjustment disorder to sudden onset medical issues in an otherwise healthy individual until June of 2021.  Recommendations: -Continue Sertraline 100 mg daily for depression/anxiety -Consider empirical treatment for Catatonia with Lorazepam 0.5 mg, 1 tablet 3 x daily x 48 hrs and switch to as needed thereafter. -Continue to explore medical and neurological causes of patient's problem  Disposition: Psyhciatric service signing out. Re-consult as needed  Corena Pilgrim, MD 12/06/2019 4:42 PM

## 2019-12-06 NOTE — Evaluation (Signed)
Physical Therapy Evaluation Patient Details Name: Hannah Padilla MRN: [id number] DOB: [birth year] Today's Date: 12/06/2019   History of Present Illness  Patient is a 69 y/o female who was recently d/ced from [place] on 10/29 and readmitted the same day hours later due to worsened mental status, weakness, poor oral intake and recurrent UTI. Pt was admitted from 9/14 to 10/4 before going to CIR. PMH includes [place] spotty fever, UTI, dementia, mood changes, anxiety, hyperthyroidism.  Clinical Impression  Patient presents with generalized weakness, impaired balance, impaired cognition and impaired mobility s/p above. Pt was recently d/ced from [place] on 10/29 and readmitted to hospital the same day due to worsened cognition, weakness and poor oral intake where spouse was not able to manage her at home. Per spouse, pt made slow gradual improvements in CIR over the last few weeks. Today pt requires Min-mod A for transfers and gait training with close chair follow and cues for sequencing/technique with use of RW. Very supportive spouse. Spouse reports pt is not back at her new cognitive baseline as of yet. Pt tearful and anxious during session. Would benefit from post acute rehab to maximize independence, mobility and ease burden of care prior to return home. Will follow acutely.    Follow Up Recommendations CIR;Supervision/Assistance - 24 hour;Supervision for mobility/OOB    Equipment Recommendations  None recommended by PT    Recommendations for Other Services Rehab consult     Precautions / Restrictions Precautions Precautions: Fall Precaution Comments: significant anxiety and fear of falling limiting motor planning Restrictions Weight Bearing Restrictions: No      Mobility  Bed Mobility               General bed mobility comments: up in chair upon PT arrival.    Transfers Overall transfer level: Needs assistance Equipment used: Rolling walker (2 wheeled) Transfers: Sit to/from  Stand Sit to Stand: Mod assist;Min assist         General transfer comment: Assist to power to standing with cues for hand placement/technique. Step by step cues for sequencing/technique. Stood from [employer], from toilet x1.  Ambulation/Gait Ambulation/Gait assistance: Min assist;+2 safety/equipment Gait Distance (Feet): 80 Feet Assistive device: Rolling walker (2 wheeled) Gait Pattern/deviations: Step-to pattern;Step-through pattern;Decreased stride length;Narrow base of support;Decreased step length - right;Decreased step length - left;Shuffle Gait velocity: decreased Gait velocity interpretation: <1.8 ft/sec, indicate of risk for recurrent falls General Gait Details: Slow shuffling like steps wtih assist for propulsion of RW forwards and support at hips. Cues to increase step length bilaterally. Chair follow.  Stairs            Wheelchair Mobility    Modified Rankin (Stroke Patients Only)       Balance Overall balance assessment: Needs assistance;History of Falls Sitting-balance support: Feet supported;No upper extremity supported Sitting balance-Leahy Scale: Fair     Standing balance support: During functional activity Standing balance-Leahy Scale: Poor Standing balance comment: Requires UE support in standing esp at hips due to posterior bias.                             Pertinent Vitals/Pain Pain Assessment: Faces Faces Pain Scale: No hurt    Home Living Family/patient expects to be discharged to:: Inpatient rehab Living Arrangements: Spouse/significant other Available Help at Discharge: Family;Available 24 hours/day Type of Home: House Home Access: Stairs to enter     Home Layout: One level Home Equipment: Bedside commode  Prior Function Level of Independence: Needs assistance   Gait / Transfers Assistance Needed: Using RW for ambulation.  ADL's / Homemaking Assistance Needed: husband assists wtih all care including initating  food/drinking.        Hand Dominance   Dominant Hand: Right    Extremity/Trunk Assessment   Upper Extremity Assessment Upper Extremity Assessment: Defer to OT evaluation    Lower Extremity Assessment Lower Extremity Assessment: Generalized weakness;Difficult to assess due to impaired cognition    Cervical / Trunk Assessment Cervical / Trunk Assessment: Kyphotic  Communication   Communication: Expressive difficulties;Receptive difficulties  Cognition Arousal/Alertness: Awake/alert Behavior During Therapy: Anxious;Flat affect Overall Cognitive Status: History of cognitive impairments - at baseline Area of Impairment: Attention;Safety/judgement;Problem solving;Awareness;Following commands;Memory;Orientation                 Orientation Level: Disoriented to;Place;Time;Situation Current Attention Level: Sustained Memory: Decreased short-term memory Following Commands: Follows one step commands consistently Safety/Judgement: Decreased awareness of safety Awareness: Intellectual Problem Solving: Slow processing;Requires verbal cues;Requires tactile cues;Decreased initiation;Difficulty sequencing General Comments: Pt is anxious and tearful during session; does well with commands from spouse [name]). Follows 1 step commands well.      General Comments General comments (skin integrity, edema, etc.): Spouse, [name], present during session and participatory throughout mobility.    Exercises     Assessment/Plan    PT Assessment Patient needs continued PT services  PT Problem List Decreased strength;Decreased range of motion;Decreased activity tolerance;Decreased balance;Decreased mobility;Decreased coordination;Decreased cognition;Decreased safety awareness       PT Treatment Interventions DME instruction;Gait training;Stair training;Functional mobility training;Therapeutic activities;Therapeutic exercise;Balance training;Neuromuscular re-education;Patient/family  education;Cognitive remediation    PT Goals (Current goals can be found in the Care Plan section)  Acute Rehab PT Goals Patient Stated Goal: to get pt to CIR again before going home PT Goal Formulation: With patient/family Time For Goal Achievement: 12/20/19 Potential to Achieve Goals: Fair    Frequency Min 3X/week   Barriers to discharge Inaccessible home environment stairs to enter home    Co-evaluation               AM-PAC PT "6 Clicks" Mobility  Outcome Measure Help needed turning from your back to your side while in a flat bed without using bedrails?: A Little Help needed moving from lying on your back to sitting on the side of a flat bed without using bedrails?: A Little Help needed moving to and from a bed to a chair (including a wheelchair)?: A Lot Help needed standing up from a chair using your arms (e.g., wheelchair or bedside chair)?: A Lot Help needed to walk in hospital room?: A Little Help needed climbing 3-5 steps with a railing? : Total 6 Click Score: 14    End of Session   Activity Tolerance: Patient tolerated treatment well Patient left: in chair;with call bell/phone within reach;with family/visitor present Nurse Communication: Mobility status PT Visit Diagnosis: Unsteadiness on feet (R26.81);Other abnormalities of gait and mobility (R26.89);Difficulty in walking, not elsewhere classified (R26.2);Other symptoms and signs involving the nervous system (R29.898)    Time: 0935-1010 PT Time Calculation (min) (ACUTE ONLY): 35 min   Charges:   PT Evaluation $PT Eval Moderate Complexity: 1 Mod PT Treatments $Gait Training: 8-22 mins        Marisa Severin, PT, DPT Acute Rehabilitation Services Pager [phone] Office [place] 12/06/2019, 11:33 AM

## 2019-12-06 NOTE — Progress Notes (Signed)
PROGRESS NOTE    [name]  RDE:[id number] DOB: 10-24-1950 DOA: 12/04/2019 PCP: Carlena Hurl, PA-C    Brief Narrative:  Hannah Padilla is a 69 year old female with past medical history remarkable for hypothyroidism, [place] spotted fever July 2021 status post 10-day course doxycycline, and recent diagnosis progressive dementia of uncertain etiology who presented to the ED for altered mental status and weakness.  Patient was discharged from inpatient rehab same day.  Diagnosed with E. coli UTI on 11/30/2019 and was initially placed empirically on ceftriaxone, although cultures were noted to be resistant to ceftriaxone, but she was discharged home on Bactrim in which she did not receive as of yet.  Husband concerned about her rapidly progressive decline with failure to thrive with poor oral intake and not been able to care for her at home.  Recent prolonged hospitalization at [facility] from 10/20/2019 through 11/09/2019 for progressive neurologic decline.  Patient was evaluated by multiple neurologists with an extensive work-up to include unremarkable TSH, unremarkable ammonia level, unremarkable B1 level, RMSF borderline positive IgG, negative IgM (completed 10 day course doxycycline July 2021), negative Lyme, normal ESR, normal thyroglobulin and thyroid peroxidase antibody, negative anti-yu, anti-yo, anti-ri.  LP negative for all, bands, negative for paraneoplastic CSF panel (Lgl1 was not included), negative for neuron-specific enolase, negative for serum anti-LGl1, negative serum autoimmune encephalopathy panel, negative 14-3-3eta.  Neurology deemed this a rapidly progressive dementia of undetermined etiology and she received a 5-day course of IV steroids and was placed on a trial of Sinemet.  In the ED, temperature 98.0, HR 84, RR 15, BP 122/93, SPO2 100% on room air.  Sodium 138, potassium 4.1, chloride 107, CO2 15, glucose 75, BUN 14, creatinine 0.80.  Anion gap 16.  Lactic acid 2.6.  WBC  count 10.8, hemoglobin 17.4, platelets 362.  Urinalysis with trace leukoesterase, negative nitrite, 6-10 WBCs.  SARS-CoV-2/influenza A-B negative.  Chest x-ray with no acute cardiopulmonary disease findings.   Assessment & Plan:   Principal Problem:   Failure to thrive in adult Active Problems:   GERD without esophagitis   E-coli UTI   Hyperthyroidism   Acute metabolic encephalopathy   Progressive dementia with uncertain etiology ([place])   Acute metabolic encephalopathy Patient presenting from home after discharge same day from inpatient rehabilitation for worsening altered mental status.  Patient with recent prolonged hospitalization with diagnosis of rapidly progressive dementia of unclear etiology.  Husband states when patient returned home, she was unable to manage her ADLs and he seems more confused than earlier in the day.  Etiology likely multifactorial with inappropriately treated UTI with multiple antibacterial resistances coupled with her underlying progressive dementia and adult failure to thrive.  Also consideration of possible catatonia. --Continue treatment as depicted below  E. coli urinary tract infection Patient diagnosed with urinary tract infection during recent inpatient rehabilitation stay.  Was initially placed on empiric ceftriaxone with urine culture growing E. coli with resistance pattern to ampicillin/sulbactam, cefazolin, ceftriaxone and ciprofloxacin.  Patient was discharged on Bactrim but did not receive dose prior to returning to the ED.  Repeat urinalysis on arrival notable for trace leukoesterase, negative nitrate, 6-10 WBCs. --Continue Zosyn in accordance with urine culture susceptibilities on 12/01/2019  Rapidly progressive dementia of undetermined etiology Patient with recent prolonged hospitalization from September through October 2021 with extensive work-up with no clear findings.  Was diagnosed in July 2021 with RMSF and completed 10-day course of  doxycycline.  Patient follows with neurology outpatient, Dr. Jannifer Franklin.  --  Sinemet 25-100 mg p.o. 3 times daily --Appears continues to rapidly progress with poor prognosis --Patient with intermittent jerking movements and schizophrenic type behaviors/speech on physical exam today; will consult psychiatry for evaluation of possible underlying catatonia  Adult failure to thrive Dehydration Severe protein calorie malnutrition Patient presenting after discharge same day from inpatient rehab with continued weakness, fatigue and altered mental status.  Patient was significantly dehydrated on initial physical exam with an elevated lactic acid of 2.6.  Husband reports that her she has not been eating or drinking well, and this is a highly concerning factor.  He states she would never want a feeding tube placed, but otherwise would want all aggressive care if she was able to achieve a good quality of life.  Discussed with husband extensively that if she did not improve her oral intake, the likelihood of returning to a more normal "stage" with a good quality of life is very unlikely.  He also stated that she has tried Marinol and Megace in the past with no significant improvement of symptoms. --Palliative care following, appreciate assistance --Nutrition following --Continue supplementation, encouragement with feeds  GERD: Protonix 40 mg p.o. daily  Hx [place] spotted fever Completed 10-day course of doxycycline on July 2021.  Depression: Sertraline 100 mg p.o. daily   DVT prophylaxis: Lovenox Code Status: Full code Family Communication: Updated patient's husband extensively at bedside this morning  Disposition Plan:  Status is: Inpatient  Remains inpatient appropriate because:Altered mental status, Ongoing diagnostic testing needed not appropriate for outpatient work up, Unsafe d/c plan, IV treatments appropriate due to intensity of illness or inability to take PO and Inpatient level of care  appropriate due to severity of illness   Dispo: The patient is from: Home              Anticipated d/c is to: SNF vs CIR              Anticipated d/c date is: 3 days              Patient currently is not medically stable to d/c.   Consultants:   Palliative care  Psychiatry  Procedures:   None  Antimicrobials:   Zosyn 10/29>>   Subjective: Patient seen and examined at bedside, resting comfortably in bedside chair.  Patient continues with very poor oral intake.  She remains pleasantly confused with intermittent jerking episodes.  Discussed with husband once again, her lack of oral intake is very concerning for end stages of life, but he wishes to attempt at further rehabilitation and requests discharge back to CIR when she is medically stable.  Palliative care continues to follow.  Await psychiatry evaluation.  No other concerns per husband at this time and is appreciative all the care his wife has received at [facility].  No acute concerns per nursing staff.  Objective: Vitals:   12/05/19 1527 12/05/19 2211 12/06/19 0429 12/06/19 0806  BP: 104/70 111/72 128/70 132/86  Pulse: 84 61 (!) 58 66  Resp: _0 Temp: 98 F (36.7 C) 98.5 F (36.9 C) 98.6 F (37 C) 98.2 F (36.8 C)  TempSrc: Oral Oral Oral Oral  SpO2: 100% 100% 100% 99%  Weight:      Height:        Intake/Output Summary (Last 24 hours) at 12/06/2019 1205 Last data filed at 12/06/2019 0900 Gross per 24 hour  Intake 630 ml  Output 1600 ml  Net -970 ml   [employer]  12/04/19 1723  Weight: 56.3 kg    Examination:  General exam: Appears calm and comfortable, confused HEENT: NCAT, PERRL, Dry mucous membranes Respiratory system: Clear to auscultation. Respiratory effort normal.  Oxygenating well on room air Cardiovascular system: S1 & S2 heard, RRR. No JVD, murmurs, rubs, gallops or clicks. No pedal edema. Gastrointestinal system: Abdomen is nondistended, soft and nontender. No organomegaly or  masses felt. Normal bowel sounds heard. Central nervous system: Alert, not oriented to person/place/time/situation. No focal neurological deficits.  Extremities: Symmetric 5 x 5 power. Skin: No rashes, lesions or ulcers Psychiatry: Judgement and insight appear poor. Depressed mood & flat affect.  Poor eye contact, speech with normal rate but decreased tone, poor concentration and attention.    Data Reviewed: I have personally reviewed following labs and imaging studies  CBC: Recent Labs  Lab 11/30/19 1603 12/02/19 0627 12/04/19 1750 12/05/19 0346  WBC 9.5 7.4 10.8* 8.6  NEUTROABS  --   --  7.2 5.4  HGB 15.8* 15.2* 17.4* 14.3  HCT 48.2* 45.5 51.9* 41.3  MCV 100.6* 99.8 99.0 98.1  PLT 334 313 362 974   Basic Metabolic Panel: Recent Labs  Lab 11/30/19 1603 12/02/19 0627 12/03/19 1047 12/04/19 1750 12/05/19 0346  NA 139 140 138 138 138  K 4.2 3.8 3.7 4.1 3.5  CL 111 110 109 107 112*  CO2 16* 16* 15* 15* 14*  GLUCOSE 94 82 78 75 78  BUN _0 CREATININE 0.73 0.78 0.82 0.80 0.81  CALCIUM 9.8 9.5 9.7 10.2 9.3  MG  --   --   --   --  2.1   GFR: Estimated Creatinine Clearance: 59.1 mL/min (by C-G formula based on SCr of 0.81 mg/dL). Liver Function Tests: Recent Labs  Lab 11/30/19 1603 12/04/19 1750 12/05/19 0346  AST 32 26 18  ALT QUANTITY NOT SUFFICIENT, UNABLE TO PERFORM TEST 56* 10  ALKPHOS 118 108 77  BILITOT QUANTITY NOT SUFFICIENT, UNABLE TO PERFORM TEST 1.3* 1.3*  PROT 5.9* 7.0 5.7*  ALBUMIN 3.7 4.3 3.4*   No results for input(s): LIPASE, AMYLASE in the last 168 hours. No results for input(s): AMMONIA in the last 168 hours. Coagulation Profile: No results for input(s): INR, PROTIME in the last 168 hours. Cardiac Enzymes: No results for input(s): CKTOTAL, CKMB, CKMBINDEX, TROPONINI in the last 168 hours. BNP (last 3 results) No results for input(s): PROBNP in the last 8760 hours. HbA1C: No results for input(s): HGBA1C in the last 72  hours. CBG: No results for input(s): GLUCAP in the last 168 hours. Lipid Profile: No results for input(s): CHOL, HDL, LDLCALC, TRIG, CHOLHDL, LDLDIRECT in the last 72 hours. Thyroid Function Tests: Recent Labs    12/04/19 2229  TSH 1.701   Anemia Panel: No results for input(s): VITAMINB12, FOLATE, FERRITIN, TIBC, IRON, RETICCTPCT in the last 72 hours. Sepsis Labs: Recent Labs  Lab 12/04/19 1750 12/04/19 2236  LATICACIDVEN 2.6* 1.2    Recent Results (from the past 240 hour(s))  Culture, Urine     Status: Abnormal   Collection Time: 12/01/19  6:16 PM   Specimen: Urine, Clean Catch  Result Value Ref Range Status   Specimen Description URINE, CLEAN CATCH  Final   Special Requests   Final    NONE Performed at [facility], [place] [address] [place], [place] [address]    Culture (A)  Final    >=100,000 COLONIES/mL ESCHERICHIA COLI Confirmed Extended Spectrum Beta-Lactamase Producer (ESBL).  In bloodstream  infections from ESBL organisms, carbapenems are preferred over piperacillin/tazobactam. They are shown to have a lower risk of mortality.    Report Status 12/04/2019 FINAL  Final   Organism ID, Bacteria ESCHERICHIA COLI (A)  Final      Susceptibility   Escherichia coli - MIC*    AMPICILLIN >=32 RESISTANT Resistant     CEFAZOLIN >=64 RESISTANT Resistant     CEFTRIAXONE >=64 RESISTANT Resistant     CIPROFLOXACIN >=4 RESISTANT Resistant     GENTAMICIN <=1 SENSITIVE Sensitive     IMIPENEM <=0.25 SENSITIVE Sensitive     NITROFURANTOIN <=16 SENSITIVE Sensitive     TRIMETH/SULFA <=20 SENSITIVE Sensitive     AMPICILLIN/SULBACTAM >=32 RESISTANT Resistant     PIP/TAZO <=4 SENSITIVE Sensitive     * >=100,000 COLONIES/mL ESCHERICHIA COLI  Respiratory Panel by RT PCR (Flu A&B, Covid) - Nasopharyngeal Swab     Status: None   Collection Time: 12/04/19 10:25 PM   Specimen: Nasopharyngeal Swab  Result Value Ref Range Status   SARS Coronavirus 2 by RT PCR NEGATIVE NEGATIVE Final     Comment: (NOTE) SARS-CoV-2 target nucleic acids are NOT DETECTED.  The SARS-CoV-2 RNA is generally detectable in upper respiratoy specimens during the acute phase of infection. The lowest concentration of SARS-CoV-2 viral copies this assay can detect is 131 copies/mL. A negative result does not preclude SARS-Cov-2 infection and should not be used as the sole basis for treatment or other patient management decisions. A negative result may occur with  improper specimen collection/handling, submission of specimen other than nasopharyngeal swab, presence of viral mutation(s) within the areas targeted by this assay, and inadequate number of viral copies (<131 copies/mL). A negative result must be combined with clinical observations, patient history, and epidemiological information. The expected result is Negative.  Fact Sheet for Patients:  PinkCheek.be  Fact Sheet for Healthcare Providers:  GravelBags.it  This test is no t yet approved or cleared by the [place] FDA and  has been authorized for detection and/or diagnosis of SARS-CoV-2 by FDA under an Emergency Use Authorization (EUA). This EUA will remain  in effect (meaning this test can be used) for the duration of the COVID-19 declaration under Section 564(b)(1) of the Act, 21 U.S.C. section 360bbb-3(b)(1), unless the authorization is terminated or revoked sooner.     Influenza A by PCR NEGATIVE NEGATIVE Final   Influenza B by PCR NEGATIVE NEGATIVE Final    Comment: (NOTE) The Xpert Xpress SARS-CoV-2/FLU/RSV assay is intended as an aid in  the diagnosis of influenza from Nasopharyngeal swab specimens and  should not be used as a sole basis for treatment. Nasal washings and  aspirates are unacceptable for Xpert Xpress SARS-CoV-2/FLU/RSV  testing.  Fact Sheet for Patients: PinkCheek.be  Fact Sheet for Healthcare  Providers: GravelBags.it  This test is not yet approved or cleared by the [place] FDA and  has been authorized for detection and/or diagnosis of SARS-CoV-2 by  FDA under an Emergency Use Authorization (EUA). This EUA will remain  in effect (meaning this test can be used) for the duration of the  Covid-19 declaration under Section 564(b)(1) of the Act, 21  U.S.C. section 360bbb-3(b)(1), unless the authorization is  terminated or revoked. Performed at [facility], [place] [address], [place], [place] [address]          Radiology Studies: DG Chest Portable 1 View  Result Date: 12/04/2019 CLINICAL DATA:  Altered mental status. EXAM: PORTABLE CHEST 1 VIEW COMPARISON:  Chest  CT 10/26/2019 FINDINGS: The cardiac silhouette, mediastinal and hilar contours are within normal limits and stable. The lungs are clear. No pleural effusions. No pulmonary lesions. The bony thorax is intact. IMPRESSION: No acute cardiopulmonary findings. Electronically Signed   By: Marijo Sanes M.D.   On: 12/04/2019 21:03        Scheduled Meds: . (feeding supplement) PROSource Plus  30 mL Oral BID BM  . carbidopa-levodopa  1 tablet Oral TID  . enoxaparin (LOVENOX) injection  40 mg Subcutaneous Q24H  . feeding supplement  237 mL Oral BID BM  . haloperidol lactate  2 mg Intravenous Once  . multivitamin with minerals  1 tablet Oral Daily  . pantoprazole  40 mg Oral Daily  . propranolol ER  60 mg Oral Daily  . senna-docusate  2 tablet Oral BID  . sertraline  100 mg Oral Daily   Continuous Infusions: . dextrose 5 % and 0.9% NaCl 100 mL/hr at 12/06/19 1115  . piperacillin-tazobactam (ZOSYN)  IV 3.375 g (12/06/19 0922)     LOS: 1 day    Time spent: 38 minutes spent on chart review, discussion with nursing staff, consultants, updating family and interview/physical exam; more than 50% of that time was spent in counseling and/or coordination of care.    Placida Cambre J [place]  DO [facility] Available via Epic secure chat 7am-7pm After these hours, please refer to coverage provider listed on [email or web address] 12/06/2019, 12:05 PM

## 2019-12-06 NOTE — Progress Notes (Signed)
Initial Nutrition Assessment  RD working remotely.  DOCUMENTATION CODES:   Not applicable  INTERVENTION:   -Continue liberalized diet of regular -MVI with minerals daily -Continue Ensure Enlive po BID, each supplement provides 350 kcal and 20 grams of protein -30 ml Prosource Plus BID, each supplement provides 100 kcals and 15 grams protein -Magic cup TID with meals, each supplement provides 290 kcal and 9 grams of protein -Feeding assistance with meals  NUTRITION DIAGNOSIS:   Inadequate oral intake related to decreased appetite as evidenced by meal completion < 50%.  GOAL:   Patient will meet greater than or equal to 90% of their needs  MONITOR:   PO intake, Supplement acceptance, Labs, Weight trends, Skin, I & O's  REASON FOR ASSESSMENT:   Malnutrition Screening Tool    ASSESSMENT:   69 year old female with past medical history of hypothyroidism, recent diagnosis of [place] spotted fever (08/2019, S/P Tx) who presented on 9/14 with progressive generalized weakness, falls and confusion over the preceding 3 months.  Pt admitted with FTT in the setting or rapidly progressing dementia of unknown etiology.   Reviewed I/O's: -90 ml x 24 hours and -73 ml since admission  UOP: 600 ml x 24 hours  Attempted to speak with pt via call to hospital room phone, however, unable to reach.   Per H&P, pt has undergone a progressive neurological decline over the past 3 months after being hospitalized with [place] Spotted fever in July 2021. Pt with minimal oral intake; pt husband unable to give pt medications PTA. Noted meal completion 50%. Pt needs assistance with feeding. She refusing Ensure supplement this morning.   Reviewed wt hx; pt has experienced a 20% wt loss over the past 3 months, which is significant for time frame.   Per palliative care notes, pt does not desire feeding tube or parenteral nutrition.   Highly suspect pt with malnutrition, however, unable to  identify at this time. Per previous admission, pt was identified with severe malnutrition by RD and suspect this is ongoing.   Medications reviewed and include sinemet, lovenox,, senokot, and dextrose 5%-0.9% sodium chloride infusion @ 100 ml/hr.   Labs reviewed.   Diet Order:   Diet Order            Diet regular Room service appropriate? Yes; Fluid consistency: Thin  Diet effective now                 EDUCATION NEEDS:   No education needs have been identified at this time  Skin:  Skin Assessment: Reviewed RN Assessment  Last BM:  Unknown  Height:   Ht Readings from Last 1 Encounters:  12/04/19 5\' 7"  (1.702 m)    Weight:   Wt Readings from Last 1 Encounters:  12/04/19 56.3 kg    Ideal Body Weight:  61.4 kg  BMI:  Body mass index is 19.44 kg/m.  Estimated Nutritional Needs:   Kcal:  2409-7353  Protein:  95-110 grams  Fluid:  > 1.7 L    Loistine Chance, RD, LDN, [place] Registered Dietitian II Certified Diabetes Care and Education Specialist Please refer to [facility] for RD and/or RD on-call/weekend/after hours pager

## 2019-12-07 ENCOUNTER — Encounter (HOSPITAL_COMMUNITY): Payer: Self-pay | Admitting: Internal Medicine

## 2019-12-07 ENCOUNTER — Telehealth: Payer: Self-pay

## 2019-12-07 DIAGNOSIS — L8990 Pressure ulcer of unspecified site, unspecified stage: Secondary | ICD-10-CM | POA: Insufficient documentation

## 2019-12-07 DIAGNOSIS — Z515 Encounter for palliative care: Secondary | ICD-10-CM

## 2019-12-07 DIAGNOSIS — Z7189 Other specified counseling: Secondary | ICD-10-CM

## 2019-12-07 DIAGNOSIS — G9341 Metabolic encephalopathy: Secondary | ICD-10-CM

## 2019-12-07 LAB — CBC
HCT: 39.8 % (ref 36.0–46.0)
Hemoglobin: 14.3 g/dL (ref 12.0–15.0)
MCH: 33.8 pg (ref 26.0–34.0)
MCHC: 35.9 g/dL (ref 30.0–36.0)
MCV: 94.1 fL (ref 80.0–100.0)
Platelets: 259 10*3/uL (ref 150–400)
RBC: 4.23 MIL/uL (ref 3.87–5.11)
RDW: 13.4 % (ref 11.5–15.5)
WBC: 8.1 10*3/uL (ref 4.0–10.5)
nRBC: 0 % (ref 0.0–0.2)

## 2019-12-07 LAB — MAGNESIUM: Magnesium: 1.9 mg/dL (ref 1.7–2.4)

## 2019-12-07 LAB — COMPREHENSIVE METABOLIC PANEL
ALT: 13 U/L (ref 0–44)
AST: 21 U/L (ref 15–41)
Albumin: 3.2 g/dL — ABNORMAL LOW (ref 3.5–5.0)
Alkaline Phosphatase: 66 U/L (ref 38–126)
Anion gap: 11 (ref 5–15)
BUN: <5 mg/dL — ABNORMAL LOW (ref 8–23)
CO2: 15 mmol/L — ABNORMAL LOW (ref 22–32)
Calcium: 8.8 mg/dL — ABNORMAL LOW (ref 8.9–10.3)
Chloride: 114 mmol/L — ABNORMAL HIGH (ref 98–111)
Creatinine, Ser: 0.63 mg/dL (ref 0.44–1.00)
GFR, Estimated: >60 mL/min (ref >60)
Glucose, Bld: 112 mg/dL — ABNORMAL HIGH (ref 70–99)
Potassium: 3.3 mmol/L — ABNORMAL LOW (ref 3.5–5.1)
Sodium: 140 mmol/L (ref 135–145)
Total Bilirubin: 1.7 mg/dL — ABNORMAL HIGH (ref 0.3–1.2)
Total Protein: 5.1 g/dL — ABNORMAL LOW (ref 6.5–8.1)

## 2019-12-07 MED ORDER — CHLORHEXIDINE GLUCONATE 0.12 % MT SOLN
15.0000 mL | Freq: Two times a day (BID) | OROMUCOSAL | Status: DC
  Administered 2019-12-07 – 2019-12-10 (×7): 15 mL via OROMUCOSAL
  Filled 2019-12-07 (×6): qty 15

## 2019-12-07 MED ORDER — ORAL CARE MOUTH RINSE
15.0000 mL | Freq: Two times a day (BID) | OROMUCOSAL | Status: DC
  Administered 2019-12-07 – 2019-12-10 (×7): 15 mL via OROMUCOSAL

## 2019-12-07 MED ORDER — DRONABINOL 2.5 MG PO CAPS
5.0000 mg | ORAL_CAPSULE | Freq: Two times a day (BID) | ORAL | Status: DC
  Administered 2019-12-07 – 2019-12-08 (×4): 5 mg via ORAL
  Filled 2019-12-07 (×4): qty 2

## 2019-12-07 MED ORDER — LACTATED RINGERS IV BOLUS
500.0000 mL | Freq: Once | INTRAVENOUS | Status: AC
  Administered 2019-12-07: 500 mL via INTRAVENOUS

## 2019-12-07 MED ORDER — MAGNESIUM SULFATE 2 GM/50ML IV SOLN
2.0000 g | Freq: Once | INTRAVENOUS | Status: AC
  Administered 2019-12-07: 2 g via INTRAVENOUS
  Filled 2019-12-07 (×2): qty 50

## 2019-12-07 MED ORDER — HALOPERIDOL LACTATE 5 MG/ML IJ SOLN: 2.0000 mg | Freq: Four times a day (QID) | INTRAMUSCULAR | Status: DC | PRN

## 2019-12-07 MED ORDER — LORAZEPAM 0.5 MG PO TABS
0.5000 mg | ORAL_TABLET | Freq: Three times a day (TID) | ORAL | Status: AC
  Administered 2019-12-07 – 2019-12-08 (×6): 0.5 mg via ORAL
  Filled 2019-12-07 (×6): qty 1

## 2019-12-07 MED ORDER — POTASSIUM CHLORIDE CRYS ER 20 MEQ PO TBCR
30.0000 meq | EXTENDED_RELEASE_TABLET | ORAL | Status: AC
  Administered 2019-12-07: 30 meq via ORAL
  Filled 2019-12-07: qty 1

## 2019-12-07 NOTE — Progress Notes (Addendum)
Upon cleaning patient's bottom, RN noted two foam dressing on both buttocks. RN lifted foam and found two stage 1 pressure injury which were not documented when patient was admitted. Pt is incontinent of bowel and bladder. Barrier cream applied to perineal area. Skin care order set implemented.

## 2019-12-07 NOTE — Progress Notes (Signed)
Daily Progress Note   Patient Name: Hannah Padilla       Date: 12/07/2019 DOB: Jun 21, 1950  Age: 69 y.o. MRN#: [id number] Attending Physician: [place] Eric J, DO Primary Care Physician: Caryl Ada Admit Date: 12/04/2019  Reason for Consultation/Follow-up: Non pain symptom management, psychosocial support  Subjective: Patient is resting comfortably in bed. Husband [name] is at bedside. I introduced myself and we reviewed/discussed patient's hospital course in detail. We discuss the sudden nature of her illness - he describes that Anu was a fully functional, independent, and creative person prior to June. He laments that this person in the bed presently is not really Nevin Bloodgood. He expresses appreciation for the care Ryen has received throughout her hospitalization.  He is hopeful for improvement, and is not ready to "give up". His short-term goal is for [facility] to go back to CIR.  Briefly discussed advanced directives - confirmed wishes for full code and intubation if needed. [name] does state Malaijah would not want prolonged support on a ventilator. She also would not want a feeding tube under any circumstances. [name] is well aware that Tirsa's po intake significantly affects her prognosis.    Regarding symptom management, [name] reports Torianne was very agitated last night, not sleeping well at all. Scheduled ativan and prn haldol did not provide relief. Discussed that this degree of agitation was hopefully an isolated incident, not a pattern.   Created space and opportunity for patient's husband to express thoughts and feelings regarding patient's current medical situation. Emotional support provided    Length of Stay: 2  Current Medications: Scheduled Meds:  . (feeding supplement) PROSource  Plus  30 mL Oral BID BM  . carbidopa-levodopa  1 tablet Oral TID  . chlorhexidine  15 mL Mouth Rinse BID  . dronabinol  5 mg Oral BID AC  . enoxaparin (LOVENOX) injection  40 mg Subcutaneous Q24H  . feeding supplement  237 mL Oral BID BM  . LORazepam  0.5 mg Oral TID  . mouth rinse  15 mL Mouth Rinse q12n4p  . multivitamin with minerals  1 tablet Oral Daily  . pantoprazole  40 mg Oral Daily  . propranolol ER  60 mg Oral Daily  . senna-docusate  2 tablet Oral BID  . sertraline  100 mg Oral Daily    Continuous  Infusions: . dextrose 5 % and 0.9% NaCl 100 mL/hr at 12/07/19 0730  . piperacillin-tazobactam (ZOSYN)  IV 3.375 g (12/07/19 1115)    PRN Meds: haloperidol lactate, polyethylene glycol  Physical Exam Constitutional:      General: She is not in acute distress. Pulmonary:     Effort: Pulmonary effort is normal.  Neurological:     Mental Status: She is alert.     Comments: Not oriented to person/place/situation  Psychiatric:     Comments: Very soft speech             Vital Signs: BP (!) 86/59 (BP Location: Left Arm)   Pulse 61   Temp 98.6 F (37 C) (Oral)   Resp 16   Ht 5\' 7"  (1.702 m)   Wt 56.3 kg   SpO2 100%   BMI 19.44 kg/m  SpO2: SpO2: 100 % O2 Device: O2 Device: Room Air O2 Flow Rate:    Intake/output summary:   Intake/Output Summary (Last 24 hours) at 12/07/2019 1616 Last data filed at 12/07/2019 1000 Gross per 24 hour  Intake 2518.4 ml  Output 525 ml  Net 1993.4 ml   LBM: Last BM Date: 12/06/19 Baseline Weight: Weight: 56.3 kg Most recent weight: Weight: 56.3 kg       Palliative Assessment/Data: PPS 40%       Palliative Care Assessment & Plan   Patient Profile: 69 year old female with past medical history of hypothyroidism and recent diagnosis of [facility] spotted fever in July 2021 s/p treatment with 10 days of doxycycline. She initially presented to the ED with progressive generalized weakness, falls and confusion over the  preceding 3 months.  She was hospitalized at [facility] from 9/14 until 10/4. She was evaluated by multiple neurologists and underwent extensive work-up for her progressive neurologic decline. Patient was ultimately diagnosed with rapidly progressive dementia of undetermined etiology. She received a 5 day course of IV steroids and was though to respond to this. She was also started on a trial of Sinemet.  Patient was discharged to CIR on 11/09/19. Her strength and functional status did gradually improve throughout her stay in rehab. She seemed to somewhat decline during the last week in rehab and a urinalysis done on 10/26 was suggestive of UTI.  After arriving home (on the morning of 10/30) the patient was extremely weak and agitated throughout the day. Also refusing to take anything by mouth. Husband brought her back into the [facility] emergency department. In the ED, labs were suggestive of severe dehydration. She was admitted to [facility] for management of failure to thrive, dehydration, acute metabolic encephalopathy, and UTI in the setting of rapidly progressive dementia of undermined etiology and recent history of RMSF.  Assessment: - progressive dementia of uncertain etiology - history of [place] Spotted Fever - failure to thrive - E coli UTI  - acute metabolic encephalopathy - severe protein calorie malnutrition  Recommendations/Plan: - continue current medical treatment - continue prn haldol for agitation and delirium - husband is hopeful for improvement  - PMT will continue to follow   Scope of Treatment (per MOST form completed 11/26/19):  Cardiopulmonary Resuscitation: Attempt Resuscitation (CPR)  Medical Interventions: Full Scope of Treatment: Use intubation, advanced airway interventions, mechanical ventilation, cardioversion as indicated, medical treatment, IV fluids, etc, also provide comfort measures. Transfer to the hospital if indicated  Antibiotics: Antibiotics if indicated  IV  Fluids: IV fluids if indicated  Feeding Tube: No feeding tube    Code Status:  Full code  Prognosis:   difficult to determine, poor with current nutritional status  Discharge Planning:  CIR versus SNF for rehab  Care plan was discussed with bedside RN.   Thank you for allowing the Palliative Medicine Team to assist in the care of this patient.   Total Time 35 minutes Prolonged Time Billed  no       Greater than 50%  of this time was spent counseling and coordinating care related to the above assessment and plan.  Lavena Bullion, NP  Please contact Palliative Medicine Team phone at [phone] for questions and concerns.

## 2019-12-07 NOTE — Evaluation (Signed)
Occupational Therapy Evaluation Patient Details Name: Hannah Padilla MRN: [id number] DOB: 1950/08/16 Today's Date: 12/07/2019    History of Present Illness Patient is a 69 y/o female who was recently d/ced from [facility] on 10/29 and readmitted the same day hours later due to worsened mental status, weakness, poor oral intake and recurrent UTI. Pt was admitted from 9/14 to 10/4 before going to CIR. PMH includes [place] spotty fever, UTI, dementia, mood changes, anxiety, hyperthyroidism.   Clinical Impression   Pt presents with diagnoses above and deficits in cognition, strength, coordination, balance, and endurance. Husband present and engaged throughout session. Pt with flat affect and per husband, did not sleep much last night. Pt overall Max A x 2 for bed mobility, requiring at least Min A to maintain sitting balance EOB during ADLs due to posterior bias. Pt overall Max A for self feeding sitting EOB, requiring multimodal cues to complete task. Pt able to whisper appropriate responses during session, able to report when too fatigued and need to return to supine. Pt requires Max A for UB ADLs and Total A for LB ADLs due to deficits. Pt's husband reports pt progressed well with CIR therapies. Recommend return to CIR to maximize independence. Plan to continue addressing simple ADLs and transfers during next session.     Follow Up Recommendations  CIR;Supervision/Assistance - 24 hour    Equipment Recommendations  Other (comment) (to be decided)    Recommendations for Other Services Rehab consult     Precautions / Restrictions Precautions Precautions: Fall Precaution Comments: significant anxiety and fear of falling limiting motor planning Restrictions Weight Bearing Restrictions: No      Mobility Bed Mobility Overal bed mobility: Needs Assistance Bed Mobility: Supine to Sit;Sit to Supine     Supine to sit: Max assist;+2 for physical assistance;HOB elevated   Sit to sidelying: Max  assist;+2 for physical assistance General bed mobility comments: Max A overall for bed mobility due to decreased initiation and multi step command following. Husband present and assisting throughout    Transfers                      Balance Overall balance assessment: Needs assistance;History of Falls Sitting-balance support: Feet supported;No upper extremity supported Sitting balance-Leahy Scale: Poor Sitting balance - Comments: Heavy posterior bias, able to sustain unsupported sitting 10 seconds before requiring at least Min A to correct Postural control: Posterior lean                                 ADL either performed or assessed with clinical judgement   ADL Overall ADL's : Needs assistance/impaired Eating/Feeding: Maximal assistance;Sitting Eating/Feeding Details (indicate cue type and reason): Max A sitting EOB for self feeding, HOH for drinking from cup (doesnt like straws). Husband feeding pt yogurt Grooming: Maximal assistance;Sitting   Upper Body Bathing: Maximal assistance;Sitting   Lower Body Bathing: Total assistance;Sit to/from stand   Upper Body Dressing : Maximal assistance;Sitting Upper Body Dressing Details (indicate cue type and reason): Max A to doff/don clean gown sitting EOB Lower Body Dressing: Total assistance;Sit to/from stand       Toileting- Water quality scientist and Hygiene: Total assistance;Sit to/from stand         General ADL Comments: Pt with flat affect, slow processing, extensive assist for all ADLs with main limitation being cognition      Vision Baseline Vision/History: Wears glasses Wears Glasses: At  all times Patient Visual Report: No change from baseline Vision Assessment?: Vision impaired- to be further tested in functional context Additional Comments: will further assess. Husband reports pt with typical focal gaze     Perception     Praxis Praxis Praxis tested?: Deficits Deficits:  Initiation;Organization    Pertinent Vitals/Pain Pain Assessment: Faces Faces Pain Scale: No hurt     Hand Dominance Right   Extremity/Trunk Assessment Upper Extremity Assessment Upper Extremity Assessment: Generalized weakness RUE Deficits / Details: WFL shoulder flexion to 90 degrees full extension of all digits and able to open and close on command- no concerns for contracture. UE appear WFL  RUE Coordination: decreased fine motor;decreased gross motor LUE Deficits / Details: appears WFL, tendency to keep hand in tight fist though PROM digit extension WFL LUE Coordination: decreased fine motor   Lower Extremity Assessment Lower Extremity Assessment: Defer to PT evaluation   Cervical / Trunk Assessment Cervical / Trunk Assessment: Kyphotic   Communication Communication Communication: Expressive difficulties;Receptive difficulties   Cognition Arousal/Alertness: Awake/alert Behavior During Therapy: Flat affect Overall Cognitive Status: History of cognitive impairments - at baseline Area of Impairment: Attention;Safety/judgement;Problem solving;Awareness;Following commands;Memory;Orientation                   Current Attention Level: Sustained Memory: Decreased short-term memory Following Commands: Follows one step commands with increased time Safety/Judgement: Decreased awareness of safety;Decreased awareness of deficits Awareness: Intellectual Problem Solving: Slow processing;Requires verbal cues;Requires tactile cues;Decreased initiation;Difficulty sequencing General Comments: Pt with flat affect throughout, whispers appropriate responses, increased time to initiate/follow commands.    General Comments  Husband present and assisting throughout session, engaged and hopeful for CIR re-admission    Exercises     Shoulder Instructions      Home Living Family/patient expects to be discharged to:: Inpatient rehab Living Arrangements: Spouse/significant  other Available Help at Discharge: Family;Available 24 hours/day Type of Home: House Home Access: Stairs to enter     Home Layout: One level     Bathroom Shower/Tub: Teacher, early years/pre: Standard Bathroom Accessibility: Yes How Accessible: Accessible via walker Home Equipment: Bedside commode   Additional Comments: per nursing, patient's husband is very attentive to patient and can provide physical assist at d/c  Lives With: Spouse    Prior Functioning/Environment Level of Independence: Needs assistance  Gait / Transfers Assistance Needed: Using RW for ambulation. ADL's / Homemaking Assistance Needed: husband assists wtih all care including initating food/drinking. Communication / Swallowing Assistance Needed: soft vocal quality; mumbling at times and sometimes coherant          OT Problem List: Decreased strength;Decreased range of motion;Decreased activity tolerance;Impaired balance (sitting and/or standing);Impaired vision/perception;Decreased coordination;Decreased safety awareness;Decreased cognition;Decreased knowledge of use of DME or AE;Impaired UE functional use      OT Treatment/Interventions: Self-care/ADL training;Therapeutic exercise;Neuromuscular education;DME and/or AE instruction;Therapeutic activities;Cognitive remediation/compensation;Visual/perceptual remediation/compensation;Patient/family education;Balance training    OT Goals(Current goals can be found in the care plan section) Acute Rehab OT Goals Patient Stated Goal: to get pt to CIR again before going home OT Goal Formulation: With patient/family Time For Goal Achievement: 12/21/19 Potential to Achieve Goals: Good ADL Goals Pt Will Perform Eating: with min assist;sitting Pt Will Perform Grooming: with min assist;sitting Pt Will Perform Upper Body Bathing: with min assist;sitting Pt Will Perform Upper Body Dressing: with min assist;sitting Pt Will Transfer to Toilet: with min guard  assist;ambulating;bedside commode  OT Frequency: Min 2X/week   Barriers to D/C:  Co-evaluation              AM-PAC OT "6 Clicks" Daily Activity     Outcome Measure Help from another person eating meals?: A Lot Help from another person taking care of personal grooming?: A Lot Help from another person toileting, which includes using toliet, bedpan, or urinal?: Total Help from another person bathing (including washing, rinsing, drying)?: Total Help from another person to put on and taking off regular upper body clothing?: A Lot Help from another person to put on and taking off regular lower body clothing?: Total 6 Click Score: 9   End of Session    Activity Tolerance: Patient limited by fatigue Patient left: in bed;with call bell/phone within reach;with family/visitor present  OT Visit Diagnosis: Unsteadiness on feet (R26.81);Other abnormalities of gait and mobility (R26.89);Muscle weakness (generalized) (M62.81);Other symptoms and signs involving cognitive function                Time: 2224-1146 OT Time Calculation (min): 42 min Charges:  OT General Charges $OT Visit: 1 Visit OT Evaluation $OT Eval Moderate Complexity: 1 Mod OT Treatments $Self Care/Home Management : 23-37 mins  Layla Maw, OTR/L  Layla Maw 12/07/2019, 8:43 AM

## 2019-12-07 NOTE — Progress Notes (Signed)
Inpatient Rehabilitation Admissions Coordinator  Inpatient rehab consult received. I am familiar with patient and her spouse from recent CIR admit. I met with patient's 's spouse at bedside to discuss his preference for rehab. He prefers inpt rehab. She was discharged on 10/29 from CIR prior to her readmission to hospital on 10/29. If unable to readmit, he would like SNF. I will discuss case with Dr. Naaman Plummer and follow up tomorrow with his recommendations.  Danne Baxter, RN, MSN Rehab Admissions Coordinator [phone] 12/07/2019 3:35 PM

## 2019-12-07 NOTE — Progress Notes (Addendum)
PROGRESS NOTE    Hannah Padilla  NAT:[id number] DOB: 05-13-1950 DOA: 12/04/2019 PCP: Carlena Hurl, PA-C    Brief Narrative:  Hannah Padilla is a 69 year old female with past medical history remarkable for hypothyroidism, [place] spotted fever July 2021 status post 10-day course doxycycline, and recent diagnosis progressive dementia of uncertain etiology who presented to the ED for altered mental status and weakness.  Patient was discharged from inpatient rehab same day.  Diagnosed with E. coli UTI on 11/30/2019 and was initially placed empirically on ceftriaxone, although cultures were noted to be resistant to ceftriaxone, but she was discharged home on Bactrim in which she did not receive as of yet.  Husband concerned about her rapidly progressive decline with failure to thrive with poor oral intake and not been able to care for her at home.  Recent prolonged hospitalization at [facility] from 10/20/2019 through 11/09/2019 for progressive neurologic decline.  Patient was evaluated by multiple neurologists with an extensive work-up to include unremarkable TSH, unremarkable ammonia level, unremarkable B1 level, RMSF borderline positive IgG, negative IgM (completed 10 day course doxycycline July 2021), negative Lyme, normal ESR, normal thyroglobulin and thyroid peroxidase antibody, negative anti-yu, anti-yo, anti-ri.  LP negative for all, bands, negative for paraneoplastic CSF panel (Lgl1 was not included), negative for neuron-specific enolase, negative for serum anti-LGl1, negative serum autoimmune encephalopathy panel, negative 14-3-3eta.  Neurology deemed this a rapidly progressive dementia of undetermined etiology and she received a 5-day course of IV steroids and was placed on a trial of Sinemet.  In the ED, temperature 98.0, HR 84, RR 15, BP 122/93, SPO2 100% on room air.  Sodium 138, potassium 4.1, chloride 107, CO2 15, glucose 75, BUN 14, creatinine 0.80.  Anion gap 16.  Lactic acid 2.6.  WBC  count 10.8, hemoglobin 17.4, platelets 362.  Urinalysis with trace leukoesterase, negative nitrite, 6-10 WBCs.  SARS-CoV-2/influenza A-B negative.  Chest x-ray with no acute cardiopulmonary disease findings.   Assessment & Plan:   Principal Problem:   Failure to thrive in adult Active Problems:   GERD without esophagitis   E-coli UTI   Hyperthyroidism   Acute metabolic encephalopathy   Progressive dementia with uncertain etiology ([place])   Pressure injury of skin   Acute metabolic encephalopathy Patient presenting from home after discharge same day from inpatient rehabilitation for worsening altered mental status.  Patient with recent prolonged hospitalization with diagnosis of rapidly progressive dementia of unclear etiology.  Husband states when patient returned home, she was unable to manage her ADLs and he seems more confused than earlier in the day.  Etiology likely multifactorial with inappropriately treated UTI with multiple antibacterial resistances coupled with her underlying progressive dementia and adult failure to thrive.  Also consideration of possible catatonia. --Continue treatment as depicted below  E. coli urinary tract infection Patient diagnosed with urinary tract infection during recent inpatient rehabilitation stay.  Was initially placed on empiric ceftriaxone with urine culture growing E. coli with resistance pattern to ampicillin/sulbactam, cefazolin, ceftriaxone and ciprofloxacin.  Patient was discharged on Bactrim but did not receive dose prior to returning to the ED.  Repeat urinalysis on arrival notable for trace leukoesterase, negative nitrate, 6-10 WBCs. --Continue Zosyn in accordance with urine culture susceptibilities on 12/01/2019  Rapidly progressive dementia of undetermined etiology Patient with recent prolonged hospitalization from September through October 2021 with extensive work-up with no clear findings.  Was diagnosed in July 2021 with RMSF and completed  10-day course of doxycycline.  Patient follows  with neurology outpatient, Dr. Jannifer Franklin.  Patient noted to have intermittent jerking movements with schizophrenic type behaviors/speech, psychiatry consulted for concern of possible underlying catatonia; although does not have full constellation of symptoms and recommended initiation of scheduled Ativan. --Sinemet 25-100 mg p.o. 3 times daily --Ativan 0.5 mg p.o. 3 times daily x 48h --Appears continues to rapidly progress with poor prognosis  Adult failure to thrive Dehydration Severe protein calorie malnutrition Patient presenting after discharge same day from inpatient rehab with continued weakness, fatigue and altered mental status.  Patient was significantly dehydrated on initial physical exam with an elevated lactic acid of 2.6.  Husband reports that her she has not been eating or drinking well, and this is a highly concerning factor.  He states she would never want a feeding tube placed, but otherwise would want all aggressive care if she was able to achieve a good quality of life.  Discussed with husband extensively that if she did not improve her oral intake, the likelihood of returning to a more normal "stage" with a good quality of life is very unlikely.  He also stated that she has tried Marinol and Megace in the past with no significant improvement of symptoms. --Palliative care following, appreciate assistance --Nutrition following --Continue supplementation, encouragement with feeds --Start Marinol  Hypokalemia Hypomagnesemia Serum 3.3, etiology likely secondary to poor oral intake, failure to thrive --Replete magnesium and potassium today --Follow electrolytes closely daily  GERD: Protonix 40 mg p.o. daily  Hx [place] spotted fever Completed 10-day course of doxycycline on July 2021.  Depression: Sertraline 100 mg p.o. daily  Stage I pressure ulcer bilateral buttocks, present on admission --Continue local wound care,  offloading   DVT prophylaxis: Lovenox Code Status: Full code Family Communication: Updated patient's husband and friend Izora Gala extensively at bedside this morning  Disposition Plan:  Status is: Inpatient  Remains inpatient appropriate because:Altered mental status, Ongoing diagnostic testing needed not appropriate for outpatient work up, Unsafe d/c plan, IV treatments appropriate due to intensity of illness or inability to take PO and Inpatient level of care appropriate due to severity of illness   Dispo: The patient is from: Home              Anticipated d/c is to: SNF vs CIR              Anticipated d/c date is: 3 days              Patient currently is not medically stable to d/c.   Consultants:  Palliative care Psychiatry  Procedures:  None  Antimicrobials:  Zosyn 10/29>>   Subjective: Patient seen and examined at bedside, sleeping.  Per husband, patient was significantly agitated yesterday evening throughout the night.  Received several doses of Ativan and Haldol.  He is concerned about her lack of progress, although he did report that she did take some bites of breakfast before sleeping this morning.  Also at bedside is family friend, Izora Gala.  Discussed with him once again that her continued decline, lack of progress is highly concerning with her failure to thrive and lack of appetite.  Husband wishes to restart Marinol which she is taken in the past, although did not seem to have much effect.  No other concerns at this time other than wanting to return to CIR for rehabilitation if able.  No acute concerns per nursing staff.  Objective: Vitals:   12/06/19 1500 12/06/19 2033 12/07/19 0503 12/07/19 0734  BP: 129/74 106/72 119/75 121/81  Pulse: 60 (!) 58 68 65  Resp: 17 18 (!) 22 17  Temp: 98.3 F (36.8 C) 97.8 F (36.6 C) 98.3 F (36.8 C) 98 F (36.7 C)  TempSrc: Axillary Oral Oral Oral  SpO2: 100% 100% 100% 100%  Weight:      Height:        Intake/Output Summary  (Last 24 hours) at 12/07/2019 1102 Last data filed at 12/07/2019 1000 Gross per 24 hour  Intake 2518.4 ml  Output 525 ml  Net 1993.4 ml   Filed Weights   12/04/19 1723  Weight: 56.3 kg    Examination:  General exam: Appears calm and comfortable, confused HEENT: NCAT, PERRL, Dry mucous membranes Respiratory system: Clear to auscultation. Respiratory effort normal.  Oxygenating well on room air Cardiovascular system: S1 & S2 heard, RRR. No JVD, murmurs, rubs, gallops or clicks. No pedal edema. Gastrointestinal system: Abdomen is nondistended, soft and nontender. No organomegaly or masses felt. Normal bowel sounds heard. Central nervous system: Alert, not oriented to person/place/time/situation. No focal neurological deficits.  Extremities: Symmetric 5 x 5 power. Skin: No rashes, lesions or ulcers Psychiatry: Judgement and insight appear poor. Depressed mood & flat affect.  Poor eye contact, speech with normal rate but decreased tone, poor concentration and attention.    Data Reviewed: I have personally reviewed following labs and imaging studies  CBC: Recent Labs  Lab 12/02/19 0627 12/04/19 1750 12/05/19 0346 12/06/19 1826 12/07/19 0225  WBC 7.4 10.8* 8.6 8.2 8.1  NEUTROABS  --  7.2 5.4  --   --   HGB 15.2* 17.4* 14.3 14.4 14.3  HCT 45.5 51.9* 41.3 41.0 39.8  MCV 99.8 99.0 98.1 94.0 94.1  PLT 313 362 257 266 626   Basic Metabolic Panel: Recent Labs  Lab 12/03/19 1047 12/04/19 1750 12/05/19 0346 12/06/19 1826 12/07/19 0225  NA 138 138 138 140 140  K 3.7 4.1 3.5 3.2* 3.3*  CL 109 107 112* 111 114*  CO2 15* 15* 14* 16* 15*  GLUCOSE 78 75 78 111* 112*  BUN 15 14 13  6* <5*  CREATININE 0.82 0.80 0.81 0.66 0.63  CALCIUM 9.7 10.2 9.3 9.1 8.8*  MG  --   --  2.1 2.0 1.9   GFR: Estimated Creatinine Clearance: 59.8 mL/min (by C-G formula based on SCr of 0.63 mg/dL). Liver Function Tests: Recent Labs  Lab 11/30/19 1603 12/04/19 1750 12/05/19 0346 12/06/19 1826  12/07/19 0225  AST 32 26 18 19 21   ALT QUANTITY NOT SUFFICIENT, UNABLE TO PERFORM TEST 56* 10 13 13   ALKPHOS 118 108 77 77 66  BILITOT QUANTITY NOT SUFFICIENT, UNABLE TO PERFORM TEST 1.3* 1.3* 1.2 1.7*  PROT 5.9* 7.0 5.7* 5.4* 5.1*  ALBUMIN 3.7 4.3 3.4* 3.3* 3.2*   No results for input(s): LIPASE, AMYLASE in the last 168 hours. No results for input(s): AMMONIA in the last 168 hours. Coagulation Profile: No results for input(s): INR, PROTIME in the last 168 hours. Cardiac Enzymes: No results for input(s): CKTOTAL, CKMB, CKMBINDEX, TROPONINI in the last 168 hours. BNP (last 3 results) No results for input(s): PROBNP in the last 8760 hours. HbA1C: No results for input(s): HGBA1C in the last 72 hours. CBG: No results for input(s): GLUCAP in the last 168 hours. Lipid Profile: No results for input(s): CHOL, HDL, LDLCALC, TRIG, CHOLHDL, LDLDIRECT in the last 72 hours. Thyroid Function Tests: Recent Labs    12/04/19 2229  TSH 1.701   Anemia Panel: No results for input(s): VITAMINB12, FOLATE, FERRITIN, TIBC,  IRON, RETICCTPCT in the last 72 hours. Sepsis Labs: Recent Labs  Lab 12/04/19 1750 12/04/19 2236 12/06/19 1145  LATICACIDVEN 2.6* 1.2 0.8    Recent Results (from the past 240 hour(s))  Culture, Urine     Status: Abnormal   Collection Time: 12/01/19  6:16 PM   Specimen: Urine, Clean Catch  Result Value Ref Range Status   Specimen Description URINE, CLEAN CATCH  Final   Special Requests   Final    NONE Performed at [facility], [place] [address] [place], [place] [address]    Culture (A)  Final    >=100,000 COLONIES/mL ESCHERICHIA COLI Confirmed Extended Spectrum Beta-Lactamase Producer (ESBL).  In bloodstream infections from ESBL organisms, carbapenems are preferred over piperacillin/tazobactam. They are shown to have a lower risk of mortality.    Report Status 12/04/2019 FINAL  Final   Organism ID, Bacteria ESCHERICHIA COLI (A)  Final      Susceptibility    Escherichia coli - MIC*    AMPICILLIN >=32 RESISTANT Resistant     CEFAZOLIN >=64 RESISTANT Resistant     CEFTRIAXONE >=64 RESISTANT Resistant     CIPROFLOXACIN >=4 RESISTANT Resistant     GENTAMICIN <=1 SENSITIVE Sensitive     IMIPENEM <=0.25 SENSITIVE Sensitive     NITROFURANTOIN <=16 SENSITIVE Sensitive     TRIMETH/SULFA <=20 SENSITIVE Sensitive     AMPICILLIN/SULBACTAM >=32 RESISTANT Resistant     PIP/TAZO <=4 SENSITIVE Sensitive     * >=100,000 COLONIES/mL ESCHERICHIA COLI  Respiratory Panel by RT PCR (Flu A&B, Covid) - Nasopharyngeal Swab     Status: None   Collection Time: 12/04/19 10:25 PM   Specimen: Nasopharyngeal Swab  Result Value Ref Range Status   SARS Coronavirus 2 by RT PCR NEGATIVE NEGATIVE Final    Comment: (NOTE) SARS-CoV-2 target nucleic acids are NOT DETECTED.  The SARS-CoV-2 RNA is generally detectable in upper respiratoy specimens during the acute phase of infection. The lowest concentration of SARS-CoV-2 viral copies this assay can detect is 131 copies/mL. A negative result does not preclude SARS-Cov-2 infection and should not be used as the sole basis for treatment or other patient management decisions. A negative result may occur with  improper specimen collection/handling, submission of specimen other than nasopharyngeal swab, presence of viral mutation(s) within the areas targeted by this assay, and inadequate number of viral copies (<131 copies/mL). A negative result must be combined with clinical observations, patient history, and epidemiological information. The expected result is Negative.  Fact Sheet for Patients:  PinkCheek.be  Fact Sheet for Healthcare Providers:  GravelBags.it  This test is no t yet approved or cleared by the [place] FDA and  has been authorized for detection and/or diagnosis of SARS-CoV-2 by FDA under an Emergency Use Authorization (EUA). This EUA will remain    in effect (meaning this test can be used) for the duration of the COVID-19 declaration under Section 564(b)(1) of the Act, 21 U.S.C. section 360bbb-3(b)(1), unless the authorization is terminated or revoked sooner.     Influenza A by PCR NEGATIVE NEGATIVE Final   Influenza B by PCR NEGATIVE NEGATIVE Final    Comment: (NOTE) The Xpert Xpress SARS-CoV-2/FLU/RSV assay is intended as an aid in  the diagnosis of influenza from Nasopharyngeal swab specimens and  should not be used as a sole basis for treatment. Nasal washings and  aspirates are unacceptable for Xpert Xpress SARS-CoV-2/FLU/RSV  testing.  Fact Sheet for Patients: PinkCheek.be  Fact Sheet for Healthcare Providers: GravelBags.it  This test is not yet approved or cleared by the [place] and  has been authorized for detection and/or diagnosis of SARS-CoV-2 by  FDA under an Emergency Use Authorization (EUA). This EUA will remain  in effect (meaning this test can be used) for the duration of the  Covid-19 declaration under Section 564(b)(1) of the Act, 21  U.S.C. section 360bbb-3(b)(1), unless the authorization is  terminated or revoked. Performed at [facility], [place] [address] [place], [place] [address]          Radiology Studies: No results found.      Scheduled Meds:  (feeding supplement) PROSource Plus  30 mL Oral BID BM   carbidopa-levodopa  1 tablet Oral TID   chlorhexidine  15 mL Mouth Rinse BID   enoxaparin (LOVENOX) injection  40 mg Subcutaneous Q24H   feeding supplement  237 mL Oral BID BM   LORazepam  0.5 mg Oral TID   mouth rinse  15 mL Mouth Rinse q12n4p   multivitamin with minerals  1 tablet Oral Daily   pantoprazole  40 mg Oral Daily   potassium chloride  30 mEq Oral Q3H   propranolol ER  60 mg Oral Daily   senna-docusate  2 tablet Oral BID   sertraline  100 mg Oral Daily   Continuous Infusions:  dextrose 5 % and  0.9% NaCl 100 mL/hr at 12/07/19 0730   magnesium sulfate bolus IVPB 2 g (12/07/19 1009)   piperacillin-tazobactam (ZOSYN)  IV 3.375 g (12/07/19 0024)     LOS: 2 days    Time spent: 39 minutes spent on chart review, discussion with nursing staff, consultants, updating family and interview/physical exam; more than 50% of that time was spent in counseling and/or coordination of care.    Kean Gautreau J [place] DO [facility] Available via Epic secure chat 7am-7pm After these hours, please refer to coverage provider listed on [email or web address] 12/07/2019, 11:02 AM

## 2019-12-07 NOTE — Progress Notes (Signed)
535 am - CNA Verline Lema reports husband is upset.  States (loudly) "he hasn't seen his nurse all night".  Per MAR - I was in the room approximately every 2 hours.    He is also upset she has had a BM.  I assisted Kaitlyn to get patient cleaned up.  Gave full bath and full linen change.  Patient skin was not red or irritated - the BM had not been there long.  Foam changed.    Husband upset and irritable.

## 2019-12-07 NOTE — Progress Notes (Addendum)
Husband reports patient has been agitated and anxious for the past 2 hours.  He would like Ativan dose or frequency increased.

## 2019-12-07 NOTE — Progress Notes (Signed)
Nutrition Follow-up  DOCUMENTATION CODES:   Severe malnutrition in context of chronic illness  INTERVENTION:   -MVI with minerals daily -Continue Ensure Enlive po BID, each supplement provides 350 kcal and 20 grams of protein -Continue 30 ml Prosource Plus BID, each supplement provides 100 kcals and 15 grams protein -Continue Magic cup TID with meals, each supplement provides 290 kcal and 9 grams of protein -Continue feeding assistance with meals  NUTRITION DIAGNOSIS:   Severe Malnutrition related to chronic illness (progressive dementia) as evidenced by moderate fat depletion, severe fat depletion, moderate muscle depletion, severe muscle depletion.  Ongoing  GOAL:   Patient will meet greater than or equal to 90% of their needs  Progressing   MONITOR:   PO intake, Supplement acceptance, Labs, Weight trends, Skin, I & O's  REASON FOR ASSESSMENT:   Malnutrition Screening Tool    ASSESSMENT:   69 year old female with past medical history of hypothyroidism, recent diagnosis of [place] spotted fever (08/2019, S/P Tx) who presented on 9/14 with progressive generalized weakness, falls and confusion over the preceding 3 months.  Reviewed I/O's: +1.1 L x 24 hours and +1.1 L since admission  UOP: 1.5 L x 24 hours  Spoke with pt's close friend Juliann Pulse) at bedside. She reports that pt he been very agitated today. She continues to have minimal PO intake; consumed only a few bites of yogurt this morning. Per Juliann Pulse, pt has had decreased appetite over the past several months and was a light eater at baseline. She reports some improvements last week when in CIR, however, intake declined at discharge as her husband had severe difficulty feeding her.   Reviewed wt hx; pt has experienced a 20% wt loss over the past 3 months, which is significant for time frame.   Discussed interventions with Juliann Pulse, who was very appreciative of care. Per palliative care notes, pt does not desire  nutrition support.   Medications reviewed and include sinemet, marinol, lovenox, ativan, senokot, and dextrose 5%-0.9% sodium chloride infusion @ 100 ml/hr.   Labs reviewed: K: 3.3.   NUTRITION - FOCUSED PHYSICAL EXAM:    Most Recent Value  Orbital Region Moderate depletion  Upper Arm Region Severe depletion  Thoracic and Lumbar Region Severe depletion  Buccal Region Moderate depletion  Temple Region Moderate depletion  Clavicle Bone Region Moderate depletion  Clavicle and Acromion Bone Region Moderate depletion  Scapular Bone Region Moderate depletion  Dorsal Hand Severe depletion  Patellar Region Severe depletion  Anterior Thigh Region Severe depletion  Posterior Calf Region Severe depletion  Edema (RD Assessment) None  Hair Reviewed  Eyes Reviewed  Mouth Reviewed  Skin Reviewed  Nails Reviewed       Diet Order:   Diet Order            Diet regular Room service appropriate? Yes; Fluid consistency: Thin  Diet effective now                 EDUCATION NEEDS:   Education needs have been addressed  Skin:  Skin Assessment: Skin Integrity Issues: Skin Integrity Issues:: Stage I Stage I: lt buttocks  Last BM:  12/06/19  Height:   Ht Readings from Last 1 Encounters:  12/04/19 5\' 7"  (1.702 m)    Weight:   Wt Readings from Last 1 Encounters:  12/04/19 56.3 kg    Ideal Body Weight:  61.4 kg  BMI:  Body mass index is 19.44 kg/m.  Estimated Nutritional Needs:   Kcal:  4008-6761  Protein:  95-110 grams  Fluid:  > 1.7 L    Loistine Chance, RD, LDN, [place] Registered Dietitian II Certified Diabetes Care and Education Specialist Please refer to [facility] for RD and/or RD on-call/weekend/after hours pager

## 2019-12-08 LAB — BASIC METABOLIC PANEL
Anion gap: 8 (ref 5–15)
BUN: 5 mg/dL — ABNORMAL LOW (ref 8–23)
CO2: 23 mmol/L (ref 22–32)
Calcium: 8.6 mg/dL — ABNORMAL LOW (ref 8.9–10.3)
Chloride: 112 mmol/L — ABNORMAL HIGH (ref 98–111)
Creatinine, Ser: 0.73 mg/dL (ref 0.44–1.00)
GFR, Estimated: >60 mL/min (ref >60)
Glucose, Bld: 117 mg/dL — ABNORMAL HIGH (ref 70–99)
Potassium: 3.2 mmol/L — ABNORMAL LOW (ref 3.5–5.1)
Sodium: 143 mmol/L (ref 135–145)

## 2019-12-08 LAB — MAGNESIUM: Magnesium: 2.4 mg/dL (ref 1.7–2.4)

## 2019-12-08 MED ORDER — MEGESTROL ACETATE 40 MG PO TABS
40.0000 mg | ORAL_TABLET | Freq: Two times a day (BID) | ORAL | Status: DC
  Administered 2019-12-08 (×2): 40 mg via ORAL
  Filled 2019-12-08 (×3): qty 1

## 2019-12-08 MED ORDER — POTASSIUM CHLORIDE CRYS ER 20 MEQ PO TBCR
40.0000 meq | EXTENDED_RELEASE_TABLET | ORAL | Status: AC
  Administered 2019-12-08 (×2): 40 meq via ORAL
  Filled 2019-12-08 (×2): qty 2

## 2019-12-08 NOTE — Progress Notes (Signed)
PROGRESS NOTE    Hannah Padilla  PZW:[id number] DOB: 08-16-1950 DOA: 12/04/2019 PCP: Carlena Hurl, PA-C    Brief Narrative:  Hannah Padilla is a 69 year old female with past medical history remarkable for hypothyroidism, [place] spotted fever July 2021 status post 10-day course doxycycline, and recent diagnosis progressive dementia of uncertain etiology who presented to the ED for altered mental status and weakness.  Patient was discharged from inpatient rehab same day.  Diagnosed with E. coli UTI on 11/30/2019 and was initially placed empirically on ceftriaxone, although cultures were noted to be resistant to ceftriaxone, but she was discharged home on Bactrim in which she did not receive as of yet.  Husband concerned about her rapidly progressive decline with failure to thrive with poor oral intake and not been able to care for her at home.  Recent prolonged hospitalization at [facility] from 10/20/2019 through 11/09/2019 for progressive neurologic decline.  Patient was evaluated by multiple neurologists with an extensive work-up to include unremarkable TSH, unremarkable ammonia level, unremarkable B1 level, RMSF borderline positive IgG, negative IgM (completed 10 day course doxycycline July 2021), negative Lyme, normal ESR, normal thyroglobulin and thyroid peroxidase antibody, negative anti-yu, anti-yo, anti-ri.  LP negative for all, bands, negative for paraneoplastic CSF panel (Lgl1 was not included), negative for neuron-specific enolase, negative for serum anti-LGl1, negative serum autoimmune encephalopathy panel, negative 14-3-3eta.  Neurology deemed this a rapidly progressive dementia of undetermined etiology and she received a 5-day course of IV steroids and was placed on a trial of Sinemet.  In the ED, temperature 98.0, HR 84, RR 15, BP 122/93, SPO2 100% on room air.  Sodium 138, potassium 4.1, chloride 107, CO2 15, glucose 75, BUN 14, creatinine 0.80.  Anion gap 16.  Lactic acid 2.6.  WBC  count 10.8, hemoglobin 17.4, platelets 362.  Urinalysis with trace leukoesterase, negative nitrite, 6-10 WBCs.  SARS-CoV-2/influenza A-B negative.  Chest x-ray with no acute cardiopulmonary disease findings.   Assessment & Plan:   Principal Problem:   Failure to thrive in adult Active Problems:   GERD without esophagitis   E-coli UTI   Hyperthyroidism   Acute metabolic encephalopathy   Progressive dementia with uncertain etiology ([place])   Pressure injury of skin   Goals of care, counseling/discussion   Acute metabolic encephalopathy Patient presenting from home after discharge same day from inpatient rehabilitation for worsening altered mental status.  Patient with recent prolonged hospitalization with diagnosis of rapidly progressive dementia of unclear etiology.  Husband states when patient returned home, she was unable to manage her ADLs and he seems more confused than earlier in the day.  Etiology likely multifactorial with inappropriately treated UTI with multiple antibacterial resistances coupled with her underlying progressive dementia and adult failure to thrive.  Also consideration of possible catatonia. --Continue treatment as depicted below  ESBL E. coli urinary tract infection Patient diagnosed with urinary tract infection during recent inpatient rehabilitation stay.  Was initially placed on empiric ceftriaxone with urine culture growing E. coli with resistance pattern to ampicillin/sulbactam, cefazolin, ceftriaxone and ciprofloxacin.  Patient was discharged on Bactrim but did not receive dose prior to returning to the ED.  Repeat urinalysis on arrival notable for trace leukoesterase, negative nitrate, 6-10 WBCs. --Continue Zosyn in accordance with urine culture susceptibilities on 12/01/2019; plan 5-day course  Rapidly progressive dementia of undetermined etiology Patient with recent prolonged hospitalization from September through October 2021 with extensive work-up with no  clear findings.  Was diagnosed in July 2021 with  RMSF and completed 10-day course of doxycycline.  Patient follows with neurology outpatient, Dr. Jannifer Franklin.  Patient noted to have intermittent jerking movements with schizophrenic type behaviors/speech, psychiatry consulted for concern of possible underlying catatonia; although does not have full constellation of symptoms and recommended initiation of scheduled Ativan. --Sinemet 25-100 mg p.o. 3 times daily --Ativan 0.5 mg p.o. 3 times daily x 48h --Appears continues to rapidly progress with poor prognosis  Adult failure to thrive Dehydration Severe protein calorie malnutrition Patient presenting after discharge same day from inpatient rehab with continued weakness, fatigue and altered mental status.  Patient was significantly dehydrated on initial physical exam with an elevated lactic acid of 2.6.  Husband reports that her she has not been eating or drinking well, and this is a highly concerning factor.  He states she would never want a feeding tube placed, but otherwise would want all aggressive care if she was able to achieve a good quality of life.  Discussed with husband extensively that if she did not improve her oral intake, the likelihood of returning to a more normal "stage" with a good quality of life is very unlikely.  He also stated that she has tried Marinol and Megace in the past with no significant improvement of symptoms. --Palliative care following, appreciate assistance --Nutrition following --Continue supplementation, encouragement with feeds --Re-start Marinol and megace  Hypokalemia Hypomagnesemia Serum 3.2, etiology likely secondary to poor oral intake, failure to thrive --Replete potassium today --Follow electrolytes closely daily  GERD: Protonix 40 mg p.o. daily  Hx [place] spotted fever Completed 10-day course of doxycycline on July 2021.  Depression: Sertraline 100 mg p.o. daily  Stage I pressure ulcer bilateral  buttocks, present on admission --Continue local wound care, offloading   DVT prophylaxis: Lovenox Code Status: Full code Family Communication: Updated patient's husband extensively at bedside this morning  Disposition Plan:  Status is: Inpatient  Remains inpatient appropriate because:Altered mental status, Unsafe d/c plan, IV treatments appropriate due to intensity of illness or inability to take PO and Inpatient level of care appropriate due to severity of illness   Dispo: The patient is from: Home              Anticipated d/c is to: SNF (declined by CIR)              Anticipated d/c date is: 3 days              Patient currently is not medically stable to d/c.   Consultants:   Palliative care  Psychiatry  Procedures:   None  Antimicrobials:   Zosyn 10/29>>   Subjective: Patient seen and examined at bedside, sleeping but arousable.  Less agitation overnight.  Patient continues with very poor oral intake despite restarting appetite stimulants and assistance by family.  Has been declined by CIR for readmission.  Patient unable to participate in any meaningful manner this morning.  No other concerns per spouse today.  No acute concerns overnight per nursing staff.  Objective: Vitals:   12/07/19 1610 12/07/19 1900 12/08/19 0300 12/08/19 0841  BP: (!) 86/59 105/60 100/64 117/73  Pulse: 61 64 (!) 58 (!) 56  Resp: _0 Temp: 98.6 F (37 C) 98.5 F (36.9 C) 98.5 F (36.9 C) 98.3 F (36.8 C)  TempSrc: Oral Oral Oral Oral  SpO2: 100% 100% 100% 100%  Weight:      Height:        Intake/Output Summary (Last 24 hours) at 12/08/2019  [place] filed at 12/08/2019 1201 Gross per 24 hour  Intake 180 ml  Output 700 ml  Net -520 ml   Filed Weights   12/04/19 1723  Weight: 56.3 kg    Examination:  General exam: Appears calm and comfortable, confused HEENT: NCAT, PERRL, Dry mucous membranes Respiratory system: Clear to auscultation. Respiratory effort  normal.  Oxygenating well on room air Cardiovascular system: S1 & S2 heard, RRR. No JVD, murmurs, rubs, gallops or clicks. No pedal edema. Gastrointestinal system: Abdomen is nondistended, soft and nontender. No organomegaly or masses felt. Normal bowel sounds heard. Central nervous system: Alert, not oriented to person (president: Obama)/place shakes head), time (year: 1969) or situation. No focal neurological deficits.  Extremities: Symmetric 5 x 5 power. Skin: No rashes, lesions or ulcers Psychiatry: Judgement and insight appear poor. Depressed mood & flat affect.  Poor eye contact, speech with normal rate but decreased tone, poor concentration and attention.    Data Reviewed: I have personally reviewed following labs and imaging studies  CBC: Recent Labs  Lab 12/02/19 0627 12/04/19 1750 12/05/19 0346 12/06/19 1826 12/07/19 0225  WBC 7.4 10.8* 8.6 8.2 8.1  NEUTROABS  --  7.2 5.4  --   --   HGB 15.2* 17.4* 14.3 14.4 14.3  HCT 45.5 51.9* 41.3 41.0 39.8  MCV 99.8 99.0 98.1 94.0 94.1  PLT 313 362 257 266 277   Basic Metabolic Panel: Recent Labs  Lab 12/04/19 1750 12/05/19 0346 12/06/19 1826 12/07/19 0225 12/08/19 0329  NA 138 138 140 140 143  K 4.1 3.5 3.2* 3.3* 3.2*  CL 107 112* 111 114* 112*  CO2 15* 14* 16* 15* 23  GLUCOSE 75 78 111* 112* 117*  BUN 14 13 6* <5* 5*  CREATININE 0.80 0.81 0.66 0.63 0.73  CALCIUM 10.2 9.3 9.1 8.8* 8.6*  MG  --  2.1 2.0 1.9 2.4   GFR: Estimated Creatinine Clearance: 59.8 mL/min (by C-G formula based on SCr of 0.73 mg/dL). Liver Function Tests: Recent Labs  Lab 12/04/19 1750 12/05/19 0346 12/06/19 1826 12/07/19 0225  AST _0 ALT 56* _1 ALKPHOS 108 77 77 66  BILITOT 1.3* 1.3* 1.2 1.7*  PROT 7.0 5.7* 5.4* 5.1*  ALBUMIN 4.3 3.4* 3.3* 3.2*   No results for input(s): LIPASE, AMYLASE in the last 168 hours. No results for input(s): AMMONIA in the last 168 hours. Coagulation Profile: No results for input(s): INR,  PROTIME in the last 168 hours. Cardiac Enzymes: No results for input(s): CKTOTAL, CKMB, CKMBINDEX, TROPONINI in the last 168 hours. BNP (last 3 results) No results for input(s): PROBNP in the last 8760 hours. HbA1C: No results for input(s): HGBA1C in the last 72 hours. CBG: No results for input(s): GLUCAP in the last 168 hours. Lipid Profile: No results for input(s): CHOL, HDL, LDLCALC, TRIG, CHOLHDL, LDLDIRECT in the last 72 hours. Thyroid Function Tests: No results for input(s): TSH, T4TOTAL, FREET4, T3FREE, THYROIDAB in the last 72 hours. Anemia Panel: No results for input(s): VITAMINB12, FOLATE, FERRITIN, TIBC, IRON, RETICCTPCT in the last 72 hours. Sepsis Labs: Recent Labs  Lab 12/04/19 1750 12/04/19 2236 12/06/19 1145  LATICACIDVEN 2.6* 1.2 0.8    Recent Results (from the past 240 hour(s))  Culture, Urine     Status: Abnormal   Collection Time: 12/01/19  6:16 PM   Specimen: Urine, Clean Catch  Result Value Ref Range Status   Specimen Description URINE, CLEAN CATCH  Final   Special Requests  Final    NONE Performed at [facility], [place] [address] [place], [place] [address]    Culture (A)  Final    >=100,000 COLONIES/mL ESCHERICHIA COLI Confirmed Extended Spectrum Beta-Lactamase Producer (ESBL).  In bloodstream infections from ESBL organisms, carbapenems are preferred over piperacillin/tazobactam. They are shown to have a lower risk of mortality.    Report Status 12/04/2019 FINAL  Final   Organism ID, Bacteria ESCHERICHIA COLI (A)  Final      Susceptibility   Escherichia coli - MIC*    AMPICILLIN >=32 RESISTANT Resistant     CEFAZOLIN >=64 RESISTANT Resistant     CEFTRIAXONE >=64 RESISTANT Resistant     CIPROFLOXACIN >=4 RESISTANT Resistant     GENTAMICIN <=1 SENSITIVE Sensitive     IMIPENEM <=0.25 SENSITIVE Sensitive     NITROFURANTOIN <=16 SENSITIVE Sensitive     TRIMETH/SULFA <=20 SENSITIVE Sensitive     AMPICILLIN/SULBACTAM >=32 RESISTANT Resistant       PIP/TAZO <=4 SENSITIVE Sensitive     * >=100,000 COLONIES/mL ESCHERICHIA COLI  Respiratory Panel by RT PCR (Flu A&B, Covid) - Nasopharyngeal Swab     Status: None   Collection Time: 12/04/19 10:25 PM   Specimen: Nasopharyngeal Swab  Result Value Ref Range Status   SARS Coronavirus 2 by RT PCR NEGATIVE NEGATIVE Final    Comment: (NOTE) SARS-CoV-2 target nucleic acids are NOT DETECTED.  The SARS-CoV-2 RNA is generally detectable in upper respiratoy specimens during the acute phase of infection. The lowest concentration of SARS-CoV-2 viral copies this assay can detect is 131 copies/mL. A negative result does not preclude SARS-Cov-2 infection and should not be used as the sole basis for treatment or other patient management decisions. A negative result may occur with  improper specimen collection/handling, submission of specimen other than nasopharyngeal swab, presence of viral mutation(s) within the areas targeted by this assay, and inadequate number of viral copies (<131 copies/mL). A negative result must be combined with clinical observations, patient history, and epidemiological information. The expected result is Negative.  Fact Sheet for Patients:  PinkCheek.be  Fact Sheet for Healthcare Providers:  GravelBags.it  This test is no t yet approved or cleared by the [place] FDA and  has been authorized for detection and/or diagnosis of SARS-CoV-2 by FDA under an Emergency Use Authorization (EUA). This EUA will remain  in effect (meaning this test can be used) for the duration of the COVID-19 declaration under Section 564(b)(1) of the Act, 21 U.S.C. section 360bbb-3(b)(1), unless the authorization is terminated or revoked sooner.     Influenza A by PCR NEGATIVE NEGATIVE Final   Influenza B by PCR NEGATIVE NEGATIVE Final    Comment: (NOTE) The Xpert Xpress SARS-CoV-2/FLU/RSV assay is intended as an aid in  the  diagnosis of influenza from Nasopharyngeal swab specimens and  should not be used as a sole basis for treatment. Nasal washings and  aspirates are unacceptable for Xpert Xpress SARS-CoV-2/FLU/RSV  testing.  Fact Sheet for Patients: PinkCheek.be  Fact Sheet for Healthcare Providers: GravelBags.it  This test is not yet approved or cleared by the [place] FDA and  has been authorized for detection and/or diagnosis of SARS-CoV-2 by  FDA under an Emergency Use Authorization (EUA). This EUA will remain  in effect (meaning this test can be used) for the duration of the  Covid-19 declaration under Section 564(b)(1) of the Act, 21  U.S.C. section 360bbb-3(b)(1), unless the authorization is  terminated or revoked. Performed at [facility]  Lab, [address], [place], [place] [address]          Radiology Studies: No results found.      Scheduled Meds: . (feeding supplement) PROSource Plus  30 mL Oral BID BM  . carbidopa-levodopa  1 tablet Oral TID  . chlorhexidine  15 mL Mouth Rinse BID  . dronabinol  5 mg Oral BID AC  . enoxaparin (LOVENOX) injection  40 mg Subcutaneous Q24H  . feeding supplement  237 mL Oral BID BM  . LORazepam  0.5 mg Oral TID  . mouth rinse  15 mL Mouth Rinse q12n4p  . megestrol  40 mg Oral BID  . multivitamin with minerals  1 tablet Oral Daily  . pantoprazole  40 mg Oral Daily  . propranolol ER  60 mg Oral Daily  . senna-docusate  2 tablet Oral BID  . sertraline  100 mg Oral Daily   Continuous Infusions: . dextrose 5 % and 0.9% NaCl 100 mL/hr at 12/07/19 1940  . piperacillin-tazobactam (ZOSYN)  IV 3.375 g (12/08/19 0901)     LOS: 3 days    Time spent: 38 minutes spent on chart review, discussion with nursing staff, consultants, updating family and interview/physical exam; more than 50% of that time was spent in counseling and/or coordination of care.    [place] J [place] DO [facility] Available via Epic secure chat 7am-7pm After these hours, please refer to coverage provider listed on [email or web address] 12/08/2019, 12:22 PM

## 2019-12-08 NOTE — TOC Initial Note (Addendum)
Transition of Care [facility]) - Initial/Assessment Note    Patient Details  Name: Hannah Padilla MRN: [id number] Date of Birth: 12/16/1950  [facility] CM/SW Contact:    Curlene Labrum, RN Phone Number: 12/08/2019, 12:12 PM  Clinical Narrative:                 Case management met with the patient and husband at the bedside regarding [place] placement since patient unable to transfer to CIR.  The patient is only oriented to self.  I spoke with the patient's husband, [name], at the bedside and gave Medicare choice regarding SNf facilities in [place] and [place] since patient had friends and family in [place] as well that can visit at the facility.  The patient's husband was given Medicare.gov information and husband expressed interest in [place].  I will present the husband and patient with bed offers once available.  FL2 completed and faxed patient's clinicals out in the hub for facilities at this time.  Will continue to follow the patient for [facility] placement for short term rehabilitation.  11/2- 1529- Met with the patient and husband at the bedside a presented bed offers to them.  The patient's husband requested [facility] and [facility] at [employer].  The patient was declined for a bed offer at [facility] but the patient's husband requested [facility].  I called [facility] and started insurance authorization on the patient through [place] - [phone].  Clinicals to be faxed to [facility] for insurance auth approval.  The patient will be medically ready for discharge on 12/10/2019.  I will continue to follow the patient for insurance authorization approval.  Expected Discharge Plan: Skilled Nursing Facility Barriers to Discharge: No Barriers Identified   Patient Goals and CMS Choice Patient states their goals for this hospitalization and ongoing recovery are:: Patient is only oriented to self - Patient's husband requesting SNf  placement. CMS Medicare.gov Compare Post Acute Care list provided to:: Patient Choice offered to / list presented to : Spouse  Expected Discharge Plan and Services Expected Discharge Plan: [place] In-house Referral: Hospice / Palliative Care Discharge Planning Services: CM Consult Post Acute Care Choice: [place] Living arrangements for the past 2 months: Single Family Home                                      Prior Living Arrangements/Services Living arrangements for the past 2 months: Single Family Home Lives with:: Spouse Patient language and need for interpreter reviewed:: Yes Do you feel safe going back to the place where you live?: No   Patient needing SNf placement due to care needs.  Need for Family Participation in Patient Care: Yes (Comment) Care giver support system in place?: Yes (comment)   Criminal Activity/Legal Involvement Pertinent to Current Situation/Hospitalization: No - Comment as needed  Activities of Daily Living Home Assistive Devices/Equipment: Wheelchair ADL Screening (condition at time of admission) Patient's cognitive ability adequate to safely complete daily activities?: No Is the patient deaf or have difficulty hearing?: No Does the patient have difficulty seeing, even when wearing glasses/contacts?: No (Pt wears glassess for reading and driving.) Does the patient have difficulty concentrating, remembering, or making decisions?: Yes Patient able to express need for assistance with ADLs?: No Does the patient have difficulty dressing or bathing?: Yes Independently performs ADLs?: No Does the patient have difficulty walking or climbing  stairs?: Yes Weakness of Legs: Both Weakness of Arms/Hands: Both  Permission Sought/Granted Permission sought to share information with : Case Manager Permission granted to share information with : Yes, Verbal Permission Granted  Share Information with NAME: Alexandre Lightsey,  husband  Permission granted to share info w AGENCY: SNF facilities - husband requesting [place]        Emotional Assessment Appearance:: Appears stated age Attitude/Demeanor/Rapport: Unable to Assess Affect (typically observed): Restless Orientation: : Oriented to Self Alcohol / Substance Use: Not Applicable Psych Involvement: No (comment)  Admission diagnosis:  Failure to thrive in adult [R62.7] Altered mental status, unspecified altered mental status type [B35.32] Acute metabolic encephalopathy [D92.42] Patient Active Problem List   Diagnosis Date Noted  . Pressure injury of skin 12/07/2019  . Goals of care, counseling/discussion   . Protein-calorie malnutrition, severe 12/02/2019  . Palliative care by specialist   . Hypoalbuminemia due to protein-calorie malnutrition ([place])   . Failure to thrive in adult   . Anxiety disorder due to general medical condition   . Progressive dementia with uncertain etiology ([place]) 11/09/2019  . Acute metabolic encephalopathy 68/34/1962  . Hallucination 10/05/2019  . Dehydration 10/05/2019  . Dysphagia 10/05/2019  . Incontinence of feces 09/29/2019  . Urinary incontinence 09/29/2019  . Adrenal mass ([place]) 09/02/2019  . Decrease in appetite 09/02/2019  . Weight loss 09/02/2019  . Acute bilateral thoracic back pain 09/02/2019  . Metabolic encephalopathy 22/97/9892  . Hyperthyroidism 08/19/2019  . Panic attack 08/18/2019  . E-coli UTI 08/18/2019  . UTI (urinary tract infection) 08/12/2019  . Altered mental state 08/12/2019  . Grief 08/12/2019  . RMSF [facility] spotted fever) 07/27/2019  . Anxiety 07/21/2019  . Transaminitis 07/21/2019  . Abnormal thyroid blood test 07/21/2019  . Sleep disturbance 07/21/2019  . Hypokalemia 07/21/2019  . Leukocytosis 07/21/2019  . Tremor 07/21/2019  . Vitamin D deficiency 07/21/2019  . Palpitation 07/21/2019  . Vaginal bleeding 07/08/2018  . DNR (do not resuscitate) discussion  07/08/2018  . Chronic nausea 07/08/2018  . Screening for cervical cancer 07/08/2018  . Vaginal mass 07/08/2018  . GERD without esophagitis 07/08/2018  . Vaccine counseling 07/08/2018  . Estrogen deficiency 07/08/2018  . Post-menopausal 07/08/2018  . Need for pneumococcal vaccination 07/08/2018   PCP:  Carlena Hurl, PA-C Pharmacy:   [facility] [address], [place] [address] [place] [address] Phone: [phone] Fax: [phone]  [place], [place] [address] [place] [address] Phone: [phone] Fax: [phone]     Social Determinants of Health (SDOH) Interventions    Readmission Risk Interventions Readmission Risk Prevention Plan 12/08/2019  Transportation Screening Complete  Medication Review ([place]) Complete  PCP or Specialist appointment within 3-5 days of discharge Complete  HRI or Home Care Consult Complete  SW Recovery Care/Counseling Consult Complete  Palliative Care Screening Complete  Skilled Nursing Facility Complete  Some recent data might be hidden

## 2019-12-08 NOTE — NC FL2 (Signed)
[place] LEVEL OF CARE SCREENING TOOL     IDENTIFICATION  Patient Name: Hannah Padilla Birthdate: 1950/08/13 Sex: female Admission Date (Current Location): 12/04/2019  [facility] and [place] Number:  Herbalist and Address:  [facility], [place] [address] [place],  [address]      Provider Number: [id number]  Attending Physician Name and Address:  [place] Eric J, DO  Relative Name and Phone Number:  Tashana Haberl, [phone]    Current Level of Care: Hospital Recommended Level of Care: [place] Prior Approval Number:    Date Approved/Denied:   PASRR Number: [id number]  Discharge Plan: SNF    Current Diagnoses: Patient Active Problem List   Diagnosis Date Noted  . Pressure injury of skin 12/07/2019  . Goals of care, counseling/discussion   . Protein-calorie malnutrition, severe 12/02/2019  . Palliative care by specialist   . Hypoalbuminemia due to protein-calorie malnutrition ([place])   . Failure to thrive in adult   . Anxiety disorder due to general medical condition   . Progressive dementia with uncertain etiology ([place]) 11/09/2019  . Acute metabolic encephalopathy 27/74/1287  . Hallucination 10/05/2019  . Dehydration 10/05/2019  . Dysphagia 10/05/2019  . Incontinence of feces 09/29/2019  . Urinary incontinence 09/29/2019  . Adrenal mass ([place]) 09/02/2019  . Decrease in appetite 09/02/2019  . Weight loss 09/02/2019  . Acute bilateral thoracic back pain 09/02/2019  . Metabolic encephalopathy 86/76/7209  . Hyperthyroidism 08/19/2019  . Panic attack 08/18/2019  . E-coli UTI 08/18/2019  . UTI (urinary tract infection) 08/12/2019  . Altered mental state 08/12/2019  . Grief 08/12/2019  . RMSF [facility] spotted fever) 07/27/2019  . Anxiety 07/21/2019  . Transaminitis 07/21/2019  . Abnormal thyroid blood test 07/21/2019  . Sleep disturbance 07/21/2019  . Hypokalemia 07/21/2019  . Leukocytosis  07/21/2019  . Tremor 07/21/2019  . Vitamin D deficiency 07/21/2019  . Palpitation 07/21/2019  . Vaginal bleeding 07/08/2018  . DNR (do not resuscitate) discussion 07/08/2018  . Chronic nausea 07/08/2018  . Screening for cervical cancer 07/08/2018  . Vaginal mass 07/08/2018  . GERD without esophagitis 07/08/2018  . Vaccine counseling 07/08/2018  . Estrogen deficiency 07/08/2018  . Post-menopausal 07/08/2018  . Need for pneumococcal vaccination 07/08/2018    Orientation RESPIRATION BLADDER Height & Weight     Self  Normal External catheter Weight: 56.3 kg Height:  5\' 7"  (170.2 cm)  BEHAVIORAL SYMPTOMS/MOOD NEUROLOGICAL BOWEL NUTRITION STATUS      Continent Diet (See Discharge Summary)  AMBULATORY STATUS COMMUNICATION OF NEEDS Skin   Extensive Assist Verbally Normal                       Personal Care Assistance Level of Assistance  Bathing, Feeding, Dressing Bathing Assistance: Maximum assistance Feeding assistance: Limited assistance Dressing Assistance: Maximum assistance     Functional Limitations Info  Sight, Hearing, Speech Sight Info: Impaired Hearing Info: Adequate Speech Info: Adequate    SPECIAL CARE FACTORS FREQUENCY  PT (By licensed PT), OT (By licensed OT), Speech therapy     PT Frequency: 5 x per week OT Frequency: 5 x per week     Speech Therapy Frequency: 2 x per week      Contractures Contractures Info: Not present    Additional Factors Info  Code Status, Allergies, Psychotropic, Isolation Precautions Code Status Info: Full code Allergies Info: Methimazole Psychotropic Info: Sinemet, Haldol, Ativan, Zoloft   Isolation Precautions Info: ESBL -  contact     Current Medications (12/08/2019):  This is the current hospital active medication list Current Facility-Administered Medications  Medication Dose Route Frequency Provider Last Rate Last Admin  . (feeding supplement) PROSource Plus liquid 30 mL  30 mL Oral BID BM [place] Donnamarie Poag, DO    30 mL at 12/07/19 2237  . carbidopa-levodopa (SINEMET IR) 25-100 MG per tablet immediate release 1 tablet  1 tablet Oral TID Vernelle Emerald, MD   1 tablet at 12/08/19 0859  . chlorhexidine (PERIDEX) 0.12 % solution 15 mL  15 mL Mouth Rinse BID [place] Eric J, DO   15 mL at 12/08/19 0901  . dextrose 5 %-0.9 % sodium chloride infusion   Intravenous Continuous [place] Eric J, DO 100 mL/hr at 12/07/19 1940 New Bag at 12/07/19 1940  . dronabinol (MARINOL) capsule 5 mg  5 mg Oral BID AC [place] Eric J, DO   5 mg at 12/08/19 1150  . enoxaparin (LOVENOX) injection 40 mg  40 mg Subcutaneous Q24H Shalhoub, Sherryll Burger, MD   40 mg at 12/07/19 2238  . feeding supplement (ENSURE ENLIVE / ENSURE PLUS) liquid 237 mL  237 mL Oral BID BM [place] Eric J, DO   237 mL at 12/08/19 0901  . haloperidol lactate (HALDOL) injection 2 mg  2 mg Intravenous Q6H PRN [place] Donnamarie Poag, DO      . LORazepam (ATIVAN) tablet 0.5 mg  0.5 mg Oral TID [place] Eric J, DO   0.5 mg at 12/08/19 0859  . MEDLINE mouth rinse  15 mL Mouth Rinse q12n4p [place] Eric J, DO   15 mL at 12/07/19 1600  . megestrol (MEGACE) tablet 40 mg  40 mg Oral BID [place] Eric J, DO   40 mg at 12/08/19 1150  . multivitamin with minerals tablet 1 tablet  1 tablet Oral Daily [place] Eric J, DO   1 tablet at 12/08/19 0859  . pantoprazole (PROTONIX) EC tablet 40 mg  40 mg Oral Daily Shalhoub, Sherryll Burger, MD   40 mg at 12/08/19 0859  . piperacillin-tazobactam (ZOSYN) IVPB 3.375 g  3.375 g Intravenous Q8H Bertis Ruddy, RPH 12.5 mL/hr at 12/08/19 0901 3.375 g at 12/08/19 0901  . polyethylene glycol (MIRALAX / GLYCOLAX) packet 17 g  17 g Oral Daily PRN [place] Eric J, DO      . propranolol ER (INDERAL LA) 24 hr capsule 60 mg  60 mg Oral Daily Shalhoub, Sherryll Burger, MD   60 mg at 12/08/19 0900  . senna-docusate (Senokot-S) tablet 2 tablet  2 tablet Oral BID [place] Eric J, DO   2 tablet at 12/08/19 0858  . sertraline (ZOLOFT) tablet 100 mg  100 mg Oral Daily  Shalhoub, Sherryll Burger, MD   100 mg at 12/08/19 0900     Discharge Medications: Please see discharge summary for a list of discharge medications.  Relevant Imaging Results:  Relevant Lab Results:   Additional Information SS# [id number]  [place], [place]

## 2019-12-08 NOTE — Progress Notes (Signed)
Physical Therapy Treatment Patient Details Name: Hannah Padilla MRN: [id number] DOB: 06-May-1950 Today's Date: 12/08/2019    History of Present Illness Patient is a 69 y/o female who was recently d/ced from [facility] on 10/29 and readmitted the same day hours later due to worsened mental status, weakness, poor oral intake and recurrent UTI. Pt was admitted from 9/14 to 10/4 before going to CIR. PMH includes [place] spotty fever, UTI, dementia, mood changes, anxiety, hyperthyroidism.    PT Comments    Pt making slow progress with mobility. Husband remains very supportive and very helpful with her care.    Follow Up Recommendations  SNF     Equipment Recommendations  None recommended by PT    Recommendations for Other Services       Precautions / Restrictions Precautions Precautions: Fall    Mobility  Bed Mobility Overal bed mobility: Needs Assistance Bed Mobility: Supine to Sit     Supine to sit: Max assist;HOB elevated     General bed mobility comments: Assist to bring legs off of bed, elevate trunk into sitting and bring hips to EOB  Transfers Overall transfer level: Needs assistance Equipment used: Rolling walker (2 wheeled) Transfers: Sit to/from Stand Sit to Stand: +2 physical assistance;Mod assist         General transfer comment: Assist to bring hips up and for balance. Pt with posterior bias.   Ambulation/Gait Ambulation/Gait assistance: Min assist;Mod assist;+2 safety/equipment Gait Distance (Feet): 90 Feet Assistive device: Rolling walker (2 wheeled) Gait Pattern/deviations: Step-to pattern;Step-through pattern;Decreased step length - left;Decreased step length - right;Shuffle;Trunk flexed;Narrow base of support Gait velocity: decr Gait velocity interpretation: <1.8 ft/sec, indicate of risk for recurrent falls General Gait Details: Assist for balance and support and to steer/move walker.    Stairs             Wheelchair Mobility    Modified Rankin  (Stroke Patients Only)       Balance Overall balance assessment: Needs assistance;History of Falls Sitting-balance support: Feet supported;Bilateral upper extremity supported Sitting balance-Leahy Scale: Poor Sitting balance - Comments: Sat EOB x 5 minutes with UE support. Initially min assist with periods of min guard Postural control: Posterior lean                                  Cognition Arousal/Alertness: Awake/alert Behavior During Therapy: Flat affect;Anxious Overall Cognitive Status: History of cognitive impairments - at baseline Area of Impairment: Attention;Safety/judgement;Problem solving;Awareness;Following commands;Memory;Orientation                   Current Attention Level: Sustained   Following Commands: Follows one step commands with increased time;Follows one step commands inconsistently (and tactile and verbal cues) Safety/Judgement: Decreased awareness of safety;Decreased awareness of deficits Awareness: Intellectual Problem Solving: Slow processing;Requires verbal cues;Requires tactile cues;Decreased initiation;Difficulty sequencing General Comments: Little to no verbalizations      Exercises      General Comments General comments (skin integrity, edema, etc.): Husband present and assisting with treatment and encouraging wife.       Pertinent Vitals/Pain Pain Assessment: Faces Faces Pain Scale: No hurt    Home Living                      Prior Function            PT Goals (current goals can now be found in the care plan section) Progress  towards PT goals: Progressing toward goals    Frequency    Min 2X/week      PT Plan Discharge plan needs to be updated;Frequency needs to be updated    Co-evaluation              AM-PAC PT "6 Clicks" Mobility   Outcome Measure  Help needed turning from your back to your side while in a flat bed without using bedrails?: A Lot Help needed moving from lying on your  back to sitting on the side of a flat bed without using bedrails?: A Lot Help needed moving to and from a bed to a chair (including a wheelchair)?: A Lot Help needed standing up from a chair using your arms (e.g., wheelchair or bedside chair)?: A Lot Help needed to walk in hospital room?: A Lot Help needed climbing 3-5 steps with a railing? : Total 6 Click Score: 11    End of Session Equipment Utilized During Treatment: Gait belt Activity Tolerance: Patient tolerated treatment well Patient left: in chair;with family/visitor present (sitting in w/c with husband feeding her dinner) Nurse Communication: Mobility status PT Visit Diagnosis: Unsteadiness on feet (R26.81);Other abnormalities of gait and mobility (R26.89)     Time: 2411-4643 PT Time Calculation (min) (ACUTE ONLY): 24 min  Charges:  $Gait Training: 23-37 mins                     [place] Pager [phone] Office [place] 12/08/2019, 5:54 PM

## 2019-12-08 NOTE — Progress Notes (Signed)
Inpatient Rehabilitation Admissions Coordinator  I met with patient with her spouse at bedside. I discussed case with Dr. Naaman Plummer and he recommends SNF rehab at this time. I will alert attending MD, acute team and TOC. We will sign off at this time.  Danne Baxter, RN, MSN Rehab Admissions Coordinator [phone] 12/08/2019 11:31 AM

## 2019-12-09 ENCOUNTER — Ambulatory Visit: Payer: Medicare Other | Admitting: Neurology

## 2019-12-09 LAB — COMPREHENSIVE METABOLIC PANEL
ALT: 6 U/L (ref 0–44)
AST: 12 U/L — ABNORMAL LOW (ref 15–41)
Albumin: 2.8 g/dL — ABNORMAL LOW (ref 3.5–5.0)
Alkaline Phosphatase: 60 U/L (ref 38–126)
Anion gap: 8 (ref 5–15)
BUN: <5 mg/dL — ABNORMAL LOW (ref 8–23)
CO2: 18 mmol/L — ABNORMAL LOW (ref 22–32)
Calcium: 8.7 mg/dL — ABNORMAL LOW (ref 8.9–10.3)
Chloride: 113 mmol/L — ABNORMAL HIGH (ref 98–111)
Creatinine, Ser: 0.54 mg/dL (ref 0.44–1.00)
GFR, Estimated: >60 mL/min (ref >60)
Glucose, Bld: 114 mg/dL — ABNORMAL HIGH (ref 70–99)
Potassium: 3.3 mmol/L — ABNORMAL LOW (ref 3.5–5.1)
Sodium: 139 mmol/L (ref 135–145)
Total Bilirubin: 1 mg/dL (ref 0.3–1.2)
Total Protein: 5 g/dL — ABNORMAL LOW (ref 6.5–8.1)

## 2019-12-09 LAB — CBC
HCT: 40.2 % (ref 36.0–46.0)
Hemoglobin: 13.6 g/dL (ref 12.0–15.0)
MCH: 33.5 pg (ref 26.0–34.0)
MCHC: 33.8 g/dL (ref 30.0–36.0)
MCV: 99 fL (ref 80.0–100.0)
Platelets: 227 10*3/uL (ref 150–400)
RBC: 4.06 MIL/uL (ref 3.87–5.11)
RDW: 13.8 % (ref 11.5–15.5)
WBC: 6.1 10*3/uL (ref 4.0–10.5)
nRBC: 0 % (ref 0.0–0.2)

## 2019-12-09 LAB — MAGNESIUM: Magnesium: 2.1 mg/dL (ref 1.7–2.4)

## 2019-12-09 MED ORDER — LORAZEPAM 1 MG PO TABS: 1.0000 mg | ORAL_TABLET | ORAL | Status: DC | PRN

## 2019-12-09 MED ORDER — MEGESTROL ACETATE 400 MG/10ML PO SUSP
400.0000 mg | Freq: Every day | ORAL | Status: DC
  Filled 2019-12-09: qty 10

## 2019-12-09 MED ORDER — POTASSIUM CHLORIDE CRYS ER 20 MEQ PO TBCR: 40.0000 meq | EXTENDED_RELEASE_TABLET | ORAL | Status: DC

## 2019-12-09 MED ORDER — ONDANSETRON 4 MG PO TBDP: 4.0000 mg | ORAL_TABLET | Freq: Four times a day (QID) | ORAL | Status: DC | PRN

## 2019-12-09 MED ORDER — ALBUTEROL SULFATE (2.5 MG/3ML) 0.083% IN NEBU: 2.5000 mg | INHALATION_SOLUTION | RESPIRATORY_TRACT | Status: DC | PRN

## 2019-12-09 MED ORDER — ACETAMINOPHEN 325 MG PO TABS: 650.0000 mg | ORAL_TABLET | Freq: Four times a day (QID) | ORAL | Status: DC | PRN

## 2019-12-09 MED ORDER — LORAZEPAM 2 MG/ML IJ SOLN: 1.0000 mg | INTRAMUSCULAR | Status: DC | PRN

## 2019-12-09 MED ORDER — POLYVINYL ALCOHOL 1.4 % OP SOLN
1.0000 [drp] | Freq: Four times a day (QID) | OPHTHALMIC | Status: DC | PRN
  Filled 2019-12-09: qty 15

## 2019-12-09 MED ORDER — MORPHINE SULFATE (CONCENTRATE) 10 MG/0.5ML PO SOLN
5.0000 mg | ORAL | Status: DC | PRN
  Filled 2019-12-09: qty 0.5

## 2019-12-09 MED ORDER — LORAZEPAM 2 MG/ML PO CONC: 1.0000 mg | ORAL | Status: DC | PRN

## 2019-12-09 MED ORDER — OLANZAPINE 5 MG PO TABS: 2.5000 mg | ORAL_TABLET | Freq: Every day | ORAL | Status: DC

## 2019-12-09 MED ORDER — GLYCOPYRROLATE 0.2 MG/ML IJ SOLN
0.2000 mg | INTRAMUSCULAR | Status: DC | PRN
  Filled 2019-12-09: qty 1

## 2019-12-09 MED ORDER — CLONAZEPAM 0.25 MG PO TBDP: 0.2500 mg | ORAL_TABLET | Freq: Two times a day (BID) | ORAL | Status: DC

## 2019-12-09 MED ORDER — DIPHENHYDRAMINE HCL 50 MG/ML IJ SOLN: 12.5000 mg | INTRAMUSCULAR | Status: DC | PRN

## 2019-12-09 MED ORDER — ACETAMINOPHEN 650 MG RE SUPP: 650.0000 mg | Freq: Four times a day (QID) | RECTAL | Status: DC | PRN

## 2019-12-09 MED ORDER — BIOTENE DRY MOUTH MT LIQD: 15.0000 mL | OROMUCOSAL | Status: DC | PRN

## 2019-12-09 MED ORDER — ONDANSETRON HCL 4 MG/2ML IJ SOLN: 4.0000 mg | Freq: Four times a day (QID) | INTRAMUSCULAR | Status: DC | PRN

## 2019-12-09 MED ORDER — HALOPERIDOL 1 MG PO TABS
2.0000 mg | ORAL_TABLET | ORAL | Status: DC | PRN
  Filled 2019-12-09 (×2): qty 2

## 2019-12-09 MED ORDER — BISACODYL 10 MG RE SUPP: 10.0000 mg | Freq: Every day | RECTAL | Status: DC | PRN

## 2019-12-09 MED ORDER — HALOPERIDOL LACTATE 5 MG/ML IJ SOLN: 2.0000 mg | INTRAMUSCULAR | Status: DC | PRN

## 2019-12-09 MED ORDER — MORPHINE SULFATE (CONCENTRATE) 10 MG/0.5ML PO SOLN
5.0000 mg | ORAL | Status: DC | PRN
  Administered 2019-12-10: 5 mg via ORAL

## 2019-12-09 MED ORDER — MORPHINE SULFATE (PF) 2 MG/ML IV SOLN: 1.0000 mg | INTRAVENOUS | Status: DC | PRN

## 2019-12-09 MED ORDER — HALOPERIDOL LACTATE 2 MG/ML PO CONC
2.0000 mg | ORAL | Status: DC | PRN
  Administered 2019-12-10: 2 mg via SUBLINGUAL
  Filled 2019-12-09 (×3): qty 1

## 2019-12-09 MED ORDER — GLYCOPYRROLATE 1 MG PO TABS
1.0000 mg | ORAL_TABLET | ORAL | Status: DC | PRN
  Filled 2019-12-09: qty 1

## 2019-12-09 MED ORDER — LORAZEPAM 2 MG/ML IJ SOLN
0.5000 mg | INTRAMUSCULAR | Status: DC
  Administered 2019-12-09 – 2019-12-10 (×4): 0.5 mg via INTRAVENOUS
  Filled 2019-12-09 (×4): qty 1

## 2019-12-09 NOTE — Progress Notes (Signed)
Daily Progress Note   Patient Name: Hannah Padilla       Date: 12/09/2019 DOB: Sep 14, 1950  Age: 69 y.o. MRN#: [id number] Attending Physician: Lavina Hamman, MD Primary Care Physician: Carlena Hurl, PA-C Admit Date: 12/04/2019  Reason for Follow-up: continued goal of care discussion, symptom management  Subjective: Patient appears to be resting comfortably, and this NP did not attempt to wake her. I met with husband Hannah Padilla) the room. He expresses great concern that Hannah Padilla has not been eating or drinking for the past 2 days, and he knows this significantly affects her prognosis. He states he is ready to proceed with comfort care, as he knows this is what Hannah Padilla would want. We discuss that he is honoring her wishes, even though it is a difficult decision. Hannah Padilla is intermittently tearful during our discussion; emotional support provided. I also provided counseling on expectations at EOL.   Discussed details of transitioning to comfort care while in the hospital, and what that means--keeping her clean and dry, mouth care, no labs, no artificial hydration or feeding, no antibiotics, minimizing of medications, comfort feeds, and medication for symptom management. Hannah Padilla expresses concern that she has been intermittently anxious and agitated throughout this hospitalization, and requests she will be given medication for these symptoms. He wants to ensure Hannah Padilla remain "calm and unaware" during this transition.   We discuss the option to transfer to residential hospice - Hannah Padilla requests that she be transferred to [facility] when a bed becomes available.    Length of Stay: 4  Current Medications: Scheduled Meds:  . chlorhexidine  15 mL Mouth Rinse BID  . LORazepam  0.5 mg Intravenous Q4H  . mouth rinse   15 mL Mouth Rinse q12n4p     PRN Meds: acetaminophen **OR** acetaminophen, albuterol, antiseptic oral rinse, bisacodyl, diphenhydrAMINE, glycopyrrolate **OR** glycopyrrolate **OR** glycopyrrolate, haloperidol **OR** haloperidol **OR** haloperidol lactate, LORazepam **OR** LORazepam **OR** LORazepam, morphine injection, morphine CONCENTRATE **OR** morphine CONCENTRATE, ondansetron **OR** ondansetron (ZOFRAN) IV, polyethylene glycol, polyvinyl alcohol  Physical Exam Vitals reviewed.  Constitutional:      General: She is sleeping. She is not in acute distress. Cardiovascular:     Rate and Rhythm: Normal rate.  Pulmonary:     Effort: Pulmonary effort is normal.  Vital Signs: BP 118/70 (BP Location: Right Arm)   Pulse 70   Temp 98.3 F (36.8 C) (Axillary)   Resp 15   Ht _0  (1.702 m)   Wt 56.3 kg   SpO2 96%   BMI 19.44 kg/m  SpO2: SpO2: 96 % O2 Device: O2 Device: Room Air O2 Flow Rate:    Intake/output summary:   Intake/Output Summary (Last 24 hours) at 12/09/2019 1545 Last data filed at 12/09/2019 1500 Gross per 24 hour  Intake 290 ml  Output --  Net 290 ml   LBM: Last BM Date: 12/07/19 Baseline Weight: Weight: 56.3 kg Most recent weight: Weight: 56.3 kg       Palliative Assessment/Data: PPS 10%       Palliative Care Assessment & Plan   Patient Profile: 69 year old female with past medical history of hypothyroidism and recent diagnosis of [facility] spotted fever in July 2021 s/p treatment with 10 days of doxycycline. She initially presented to the ED with progressive generalized weakness, falls and confusion over the preceding 3 months.  She was hospitalized at [facility] from 9/14 until 10/4. She was evaluated by multiple neurologists and underwent extensive work-up for her progressive neurologic decline. Patient was ultimately diagnosed with rapidly progressive dementia of undetermined etiology. She received a 5 day course of IV steroids and was though  to respond to this. She was also started on a trial of Sinemet.  Patient was discharged to CIR on 11/09/19. Her strength and functional status did gradually improve throughout her stay in rehab. She seemed to somewhat decline during the last week in rehab and a urinalysis done on 10/26 was suggestive of UTI.  After arriving home (on the morning of 10/30) the patient was extremely weak and agitated throughout the day. Also refusing to take anything by mouth. Husband brought her back into the [facility] emergency department. In the ED, labs were suggestive of severe dehydration. She was admitted to [facility] for management of failure to thrive, dehydration, acute metabolic encephalopathy, and UTI in the setting of rapidly progressive dementia of undermined etiology and recent history of RMSF.  Assessment: - progressive dementia of uncertain etiology - history of [place] Spotted Fever - failure to thrive - E coli UTI  - acute metabolic encephalopathy - severe protein calorie malnutrition  Recommendations/Plan:  Full comfort measures initiated  DNR/DNI as previously documented - gold form completed and placed at bedside per husband's request  Transfer to [facility] when bed becomes available - TOC consult placed; TOC and hospice liaison notified  Added orders for symptom management at EOL as well as discontinued orders that were not focused on comfort  Unrestricted visitation orders were placed per current [place] EOL visitation policy   Provide frequent assessments and administer PRN medications as clinically necessary to ensure EOL comfort  Discontinued all oral medications  Added scheduled IV lorazepam (Ativan) 0.5 mg every 4 hours  PMT will continue to follow holistically  Symptom Management:   Morphine prn for pain or shortness of breath  Haloperidol (HALDOL) prn for agitation  Glycopyrrolate (ROBINUL) for excessive secretions  Ondansetron (ZOFRAN) prn for  nausea   Goals of Care and Additional Recommendations:  Limitations on Scope of Treatment: Full Comfort Care, Minimize Medications, No IV Fluids and No Lab Draws  Code Status:  DNR/DNI  Prognosis:   < 2 weeks  Discharge Planning:  Hospice facility  Care plan was discussed with   Thank you for allowing the Palliative  Medicine Team to assist in the care of this patient.   Total Time 35 minutes Prolonged Time Billed  no       Greater than 50%  of this time was spent counseling and coordinating care related to the above assessment and plan.  Lavena Bullion, NP  Please contact Palliative Medicine Team phone at [phone] for questions and concerns.

## 2019-12-09 NOTE — Progress Notes (Signed)
The patient's husband has requested that the patient not be stimulated per attempting to take medications or to be fed.  Comfort care measures being addressed, turned every 2 hours and mouth care as needed.  The patient will open her eyes and say no when attempting to perform mouth care.

## 2019-12-09 NOTE — Progress Notes (Signed)
Turned and repositioned/ The patient opens her eyes but then closes quickly.  Face is flushed.

## 2019-12-09 NOTE — TOC Progression Note (Signed)
Transition of Care (TOC) - Progression Note    Patient Details  Name: Mahreen H Thalmann MRN: [id number] Date of Birth: 07/21/1950  Transition of Care (TOC) CM/SW Contact   R Stubbldfield, RN Phone Number: 12/09/2019, 4:05 PM  Clinical Narrative:    Case management met with the patient and husband at the bedside around 1100 this afternoon to discuss SNF placement and the husband was tearful and concerned regarding the patient not eating for the past 2 days.  The husband is requesting to speak with the palliative care team and would like to talk with them about inpatient hospice - possibly [facility] for inpatient admission.  I called and spoke with the palliative care team and they plan to meet with the husband at the bedside.  I called Dr. Patel and he met with the patient's husband at the bedside regarding comfort care and DNR status.  I spoke with Julia, palliative care after the meeting with the husband and I plan to call the [facility] hospital liason RN tomorrow afternoon to allow the patient's husband to rest tonight and let the decision for comfort care.   Expected Discharge Plan: Skilled Nursing Facility Barriers to Discharge: No Barriers Identified  Expected Discharge Plan and Services Expected Discharge Plan: Skilled Nursing Facility In-house Referral: Hospice / Palliative Care Discharge Planning Services: CM Consult Post Acute Care Choice: Skilled Nursing Facility Living arrangements for the past 2 months: Single Family Home                                       Social Determinants of Health (SDOH) Interventions    Readmission Risk Interventions Readmission Risk Prevention Plan 12/08/2019  Transportation Screening Complete  Medication Review (RN Care Manager) Complete  PCP or Specialist appointment within 3-5 days of discharge Complete  HRI or Home Care Consult Complete  SW Recovery Care/Counseling Consult Complete  Palliative Care Screening Complete   Skilled Nursing Facility Complete  Some recent data might be hidden   

## 2019-12-09 NOTE — Progress Notes (Signed)
[facility] Progress Note  Patient: Hannah Padilla    GYK:[id number]  DOA: 12/04/2019     Date of Service: the patient was seen and examined on 12/09/2019  Brief hospital course: WALLIS SPIZZIRRI is a 69 year old female with past medical history remarkable for hypothyroidism, [facility] spotted fever July 2021 status post 10-day course doxycycline, and recent diagnosis progressive dementia of uncertain etiology who presented to the ED for altered mental status and weakness.  Patient was discharged from inpatient rehab same day.  Diagnosed with E. coli UTI on 11/30/2019 and was initially placed empirically on ceftriaxone, although cultures were noted to be resistant to ceftriaxone, but she was discharged home on Bactrim in which she did not receive as of yet.  Husband concerned about her rapidly progressive decline with failure to thrive with poor oral intake and not been able to care for her at home.  Recent prolonged hospitalization at [facility] from 10/20/2019 through 11/09/2019 for progressive neurologic decline.  Patient was evaluated by multiple neurologists with an extensive work-up to include unremarkable TSH, unremarkable ammonia level, unremarkable B1 level, RMSF borderline positive IgG, negative IgM (completed 10 day course doxycycline July 2021), negative Lyme, normal ESR, normal thyroglobulin and thyroid peroxidase antibody, negative anti-yu, anti-yo, anti-ri.  LP negative for all, bands, negative for paraneoplastic CSF panel (Lgl1 was not included), negative for neuron-specific enolase, negative for serum anti-LGl1, negative serum autoimmune encephalopathy panel, negative 14-3-3eta.  Neurology deemed this a rapidly progressive dementia of undetermined etiology and she received a 5-day course of IV steroids and was placed on a trial of Sinemet. On admission patient was treated with IV Zosyn for potential UTI. While she does not appear to have any evidence of infection right now she continues  to decline further with her worsening mentation and becoming more lethargic and now unable to take p.o. for last 48 hours.  Currently plan is comfort care with residential hospice transfer.  Assessment and Plan: 1.  Acute on chronic metabolic encephalopathy Rapidly progressive dementia of undetermined etiology. ESBL E. coli UTI now with colonization Recently hospitalized for a prolonged stay due to rapidly worsening mentation concerning for progressive dementia of undetermined etiology. After that patient went to IP rehab and after the patient went home and came back to the hospital same day. Found to have ESBL UTI and treated with IV Zosyn for 5 days. Despite the treatment shows continuous decline with worsening mental status. For last 2 days patient has not been able to eat or drink anything with worsening potassium levels. Patient remains confused and severely lethargic.  But remains also emotionally labile when she is awake. Husband prefers to transition to comfort care for now. Extensive work-up as well as treatment done during last admission shows no improvement in her condition.  2.  Severe protein calorie malnutrition. Adult failure to thrive. Per husband patient had mention she would not like any life prolonging measures if she does not have any good quality of life. Husband would like to transition her to complete comfort. Husband's preference would be to maintain her in the hospital but he would be okay with transitioning to residential hospice. I explained the rationale for residential hospice and he currently agrees to that.  3.  Goals of care conversation. Extensive conversation on 12/09/2019 with husband at bedside. Husband brought up the point that patient has not had anything to eat or drink for last 48 hours.  Appears to be progressively worsening.  Recommend she should be DNR.  He also recommends that she should be transition to complete comfort. He requested that we give  her medication so that she does not have any sensation about starvation and I explained the end-of-life process and how we may not need any medication and patient will still feel comfortable. Husband had primary concerns regarding frequent panic attacks that the patient is having and would like medication and treatment for that.  4.  Hypokalemia.  Hypomagnesemia. Now comfort care  5.  History of [facility] spotted fever. Already treated in the past.  6.  Stage I bilateral buttocks pressure ulcer POA. Foam dressing.  Body mass index is 19.44 kg/m.  Nutrition Problem: Severe Malnutrition Etiology: chronic illness (progressive dementia) Interventions: Interventions: Ensure Enlive (each supplement provides 350kcal and 20 grams of protein), Prostat, MVI, Magic cup  Pressure Injury 12/06/19 Buttocks Left;Right Stage 1 -  Intact skin with non-blanchable redness of a localized area usually over a bony prominence. (Active)  12/06/19 2000  Location: Buttocks  Location Orientation: Left;Right  Staging: Stage 1 -  Intact skin with non-blanchable redness of a localized area usually over a bony prominence.  Wound Description (Comments):   Present on Admission: Yes     Diet: Comfort care. DVT Prophylaxis: Comfort care.     Advance goals of care discussion: DNR  Family Communication: family was present at bedside, at the time of interview.  Opportunity was given to ask question and all questions were answered satisfactorily.   Disposition:  Status is: Inpatient  Remains inpatient appropriate because:Ongoing diagnostic testing needed not appropriate for outpatient work up   Dispo:  Patient From: Home  Planned Disposition: [place]  Expected discharge date: 12/10/19  Medically stable for discharge: No   Subjective: Remains confused.  Fatigue and tired.  Denies any acute complaint.  Physical Exam:  General: Appear in mild distress, no Rash; Oral Mucosa Clear,  moist. no Abnormal Neck Mass Or lumps, Conjunctiva normal  Cardiovascular: S1 and S2 Present, no Murmur, Respiratory: good respiratory effort, Bilateral Air entry present and CTA, no Crackles, no wheezes Abdomen: Bowel Sound present, Soft and difficult to assess  tenderness Extremities: no Pedal edema Neurology: lethargic and not oriented to time, place, and person affect tearful. no new focal deficit Gait not checked due to patient safety concerns  Vitals:   12/08/19 0841 12/08/19 1709 12/08/19 1955 12/09/19 0300  BP: 117/73 107/60 111/66 118/70  Pulse: (!) 56 70 73 70  Resp: _0 Temp: 98.3 F (36.8 C) 97.9 F (36.6 C) 98.6 F (37 C) 98.3 F (36.8 C)  TempSrc: Oral Oral Oral Axillary  SpO2: 100% 99% 97% 96%  Weight:      Height:        Intake/Output Summary (Last 24 hours) at 12/09/2019 1639 Last data filed at 12/09/2019 1500 Gross per 24 hour  Intake 290 ml  Output --  Net 290 ml   Filed Weights   12/04/19 1723  Weight: 56.3 kg    Data Reviewed: I have personally reviewed and interpreted daily labs, tele strips, imagings as discussed above. I reviewed all nursing notes, pharmacy notes, vitals, pertinent old records I have discussed plan of care as described above with RN and patient/family.  CBC: Recent Labs  Lab 12/04/19 1750 12/05/19 0346 12/06/19 1826 12/07/19 0225 12/09/19 0113  WBC 10.8* 8.6 8.2 8.1 6.1  NEUTROABS 7.2 5.4  --   --   --   HGB 17.4* 14.3 14.4 14.3 13.6  HCT  51.9* 41.3 41.0 39.8 40.2  MCV 99.0 98.1 94.0 94.1 99.0  PLT 362 257 266 259 201   Basic Metabolic Panel: Recent Labs  Lab 12/05/19 0346 12/06/19 1826 12/07/19 0225 12/08/19 0329 12/09/19 0113  NA 138 140 140 143 139  K 3.5 3.2* 3.3* 3.2* 3.3*  CL 112* 111 114* 112* 113*  CO2 14* 16* 15* 23 18*  GLUCOSE 78 111* 112* 117* 114*  BUN 13 6* <5* 5* <5*  CREATININE 0.81 0.66 0.63 0.73 0.54  CALCIUM 9.3 9.1 8.8* 8.6* 8.7*  MG 2.1 2.0 1.9 2.4 2.1    Studies: No  results found.  Scheduled Meds: . chlorhexidine  15 mL Mouth Rinse BID  . LORazepam  0.5 mg Intravenous Q4H  . mouth rinse  15 mL Mouth Rinse q12n4p   Continuous Infusions: PRN Meds: acetaminophen **OR** acetaminophen, albuterol, antiseptic oral rinse, bisacodyl, diphenhydrAMINE, glycopyrrolate **OR** glycopyrrolate **OR** glycopyrrolate, haloperidol **OR** haloperidol **OR** haloperidol lactate, LORazepam **OR** LORazepam **OR** LORazepam, morphine injection, morphine CONCENTRATE **OR** morphine CONCENTRATE, ondansetron **OR** ondansetron (ZOFRAN) IV, polyethylene glycol, polyvinyl alcohol  Time spent: 35 minutes  Author: Berle Mull, MD [facility] 12/09/2019 4:39 PM  To reach On-call, see care teams to locate the attending and reach out via [email or web address]. Between 7PM-7AM, please contact night-coverage If you still have difficulty reaching the attending provider, please page the [facility] (Director on Call) for [facility] on amion for assistance.

## 2019-12-09 NOTE — Progress Notes (Signed)
The patient's husband is at the the bedside and is asking questions in regards to Code status- was instructed to communicate with attending Doctor during rounds.

## 2019-12-09 NOTE — Plan of Care (Signed)

## 2019-12-09 NOTE — Consult Note (Signed)
   [facility] Inpatient Consult   12/09/2019  Hannah Padilla 1950/09/07 [id number]  [employer] [ACO] Patient: [name]   Patient screened for extreme high risk score for unplanned readmission score and for less than 30 days readmission hospitalization.  Reviewed to check for potential [employer] needs for transition of care. Primary Care Provider is  Tysinger, Camelia Eng, PA-C this provider is listed to provide the transition of care [TOC] for post hospital follow up.  Plan:  Chart review reveals patient is currently consulting with MD and palliative care team regarding decision ongoing.  Will follow with inpatient [facility] team member.  For questions contact:   Natividad Brood, RN BSN [place] [facility] Liaison  [phone] business mobile phone Toll free office [phone]  Fax number: [phone] [email or web address]

## 2019-12-09 NOTE — Progress Notes (Signed)
This chaplain responded to consult for EOL spiritual care. The Pt. is Palliative Care patient.  The Pt. husband-[name] is bedside.  [name] and I step outside the Pt. room to talk, noting the Pt. is resting comfortably.  [name] shares the decision to transition the Pt. to "comfort care" and asks the chaplain assistance in communicating the decision to PMT.  The chaplain talked to PMT NP-JM with the updates.   [name] is requesting the Pt. MOST form be updated in [place] and posted bedside. [name] articulated the desire for clear communication.  Residential Hospice is [name]'s 1st choice, if the Pt. can be transported with minimal transition.  The goal is for the Pt. not to wake up and become fearful.  [place] is the Pt. second choice with freedom for visitors.  [name] requests the Pt. remain comfortable at all times with medication.  The chaplain understands [name] has a clear story of the Pt. EOL wishes and has grief support from family and friends.   The preferred method of communication with [name] is by cell phone if [name] is at the hospital.  If [name] is at home, please send a text first and [name] will reply.  The chaplain is available for F/U spiritual care as needed.

## 2019-12-09 NOTE — Progress Notes (Signed)
Dr Posey Pronto notified of the patient not being able to take her po medications this morning due todecreased responsiveness and to avoid aspiration

## 2019-12-10 MED ORDER — LORAZEPAM 2 MG/ML IJ SOLN: 0.5000 mg | INTRAMUSCULAR | 0 refills | Status: AC

## 2019-12-10 NOTE — Progress Notes (Signed)
Colletta Maryland at [facility] was contacted and given report.  A printed discharge summary has been placed in the paper work for [employer].  The patient's husband is present and is aware of the discharge/transfer.

## 2019-12-10 NOTE — Progress Notes (Signed)
Patient was turned and positioned. Bed clothing checked for transport.  The patient co being cold, blankets applied for comfort.

## 2019-12-10 NOTE — Plan of Care (Signed)

## 2019-12-10 NOTE — Progress Notes (Signed)
Daily Progress Note   Patient Name: Hannah Padilla       Date: 12/10/2019 DOB: November 10, 1950  Age: 69 y.o. MRN#: [id number] Attending Physician: Lavina Hamman, MD Primary Care Physician: Carlena Hurl, PA-C Admit Date: 12/04/2019  Reason for Consultation/Follow-up: Terminal Care, symptom management  Subjective: Patient appears comfortable. Awaiting transfer to [facility] today.   Physical Exam       Constitutional: resting quietly in bed, eyes closed Neuro: Unresponsive to voice and light touch Pulmonary: Respirations even and unlabored    Length of Stay: 5  Current Medications: Scheduled Meds:  . chlorhexidine  15 mL Mouth Rinse BID  . LORazepam  0.5 mg Intravenous Q4H  . mouth rinse  15 mL Mouth Rinse q12n4p      PRN Meds: acetaminophen **OR** acetaminophen, albuterol, antiseptic oral rinse, bisacodyl, diphenhydrAMINE, glycopyrrolate **OR** glycopyrrolate **OR** glycopyrrolate, haloperidol **OR** haloperidol **OR** haloperidol lactate, morphine injection, morphine CONCENTRATE **OR** morphine CONCENTRATE, ondansetron **OR** ondansetron (ZOFRAN) IV, polyethylene glycol, polyvinyl alcohol         Vital Signs: BP 113/68 (BP Location: Left Arm)   Pulse 64   Temp 99 F (37.2 C) (Oral)   Resp 14   Ht 5\' 7"  (1.702 m)   Wt 56.3 kg   SpO2 100%   BMI 19.44 kg/m  SpO2: SpO2: 100 % O2 Device: O2 Device: Room Air O2 Flow Rate:    Intake/output summary:   Intake/Output Summary (Last 24 hours) at 12/10/2019 1425 Last data filed at 12/09/2019 1500 Gross per 24 hour  Intake 120 ml  Output --  Net 120 ml   LBM: Last BM Date: 12/07/19 Baseline Weight: Weight: 56.3 kg Most recent weight: Weight: 56.3 kg       Palliative Assessment/Data: PPS 10%      Palliative Care  Assessment & Plan   Patient Profile: 69 year old female with past medical history of hypothyroidism and recent diagnosis of [place] spotted fever in July 2021 s/p treatment with 10 days of doxycycline. She initially presented to the ED with progressive generalized weakness, falls and confusion over the preceding 3 months.  She was hospitalized at [facility] from 9/14 until 10/4. She was evaluated by multiple neurologists and underwent extensive work-up for her progressive neurologic decline. Patient was ultimately diagnosed with rapidly progressive dementia of undetermined  etiology. She received a 5 day course of IV steroids and was though to respond to this. She was also started on a trial of Sinemet.  Patient was discharged to CIR on 11/09/19. Her strength and functional status did gradually improve throughout her stay in rehab. She seemed to somewhat decline during the last week in rehab and a urinalysis done on 10/26 was suggestive of UTI.  After arriving home (on the morning of 10/30) the patient was extremely weak and agitated throughout the day. Also refusing to take anything by mouth. Husband brought her back into the [facility] emergency department. In the ED, labs were suggestive of severe dehydration. She was admitted to [facility] for management of failure to thrive, dehydration, acute metabolic encephalopathy, and UTI in the setting of rapidly progressive dementia of undermined etiology and recent history of RMSF.  Assessment: - progressive dementia of uncertain etiology - history of [place] Spotted Fever - failure to thrive - E coli UTI  - acute metabolic encephalopathy - severe protein calorie malnutrition  Recommendations/Plan: - continue full comfort care - DNR/DNI (gold form is on shadow chart) - provide frequent assessments and administer PRN medications to ensure EOL comfort - continue scheduled IV lorazepam (Ativan) 0.5 mg every 4 hours  Goals of Care and Additional  Recommendations:  Limitations on Scope of Treatment: Full Comfort Care  Code Status:  Prognosis:   < 2 weeks  Discharge Planning:  Hospice facility  Care plan was discussed with bedside RN and case management  Thank you for allowing the Palliative Medicine Team to assist in the care of this patient.   Total Time 15 minutes Prolonged Time Billed  no       Greater than 50%  of this time was spent counseling and coordinating care related to the above assessment and plan.  Lavena Bullion, NP  Please contact Palliative Medicine Team phone at [phone] for questions and concerns.

## 2019-12-10 NOTE — Progress Notes (Signed)
Manufacturing engineer [facility]) Hospital Liaison note.    Received request from [place] for family interest in [facility]. Chart reviewed and eligibility confirmed. Spoke with family to confirm interest and explain services. Family agreeable to transfer today. TOC aware.     [facility] will notify TOC when registration paperwork has been completed to arrange transport.    RN please call report to [phone].   Thank you,      Farrel Gordon, RN, CCM       [place] (listed on [place] under Hospice/[facility])     [phone]

## 2019-12-10 NOTE — Progress Notes (Signed)
Nutrition Brief Note  Chart reviewed. Pt now transitioning to comfort care.  No further nutrition interventions warranted at this time.  Please re-consult as needed.   Bryn Perkin W, RD, LDN, CDCES Registered Dietitian II Certified Diabetes Care and Education Specialist Please refer to AMION for RD and/or RD on-call/weekend/after hours pager  

## 2019-12-10 NOTE — Care Management Important Message (Signed)
Important Message  Patient Details  Name: Hannah Padilla MRN: [id number] Date of Birth: 02-16-1950   Medicare Important Message Given:  Yes - Important Message mailed due to current National Emergency  Verbal consent obtained due to current National Emergency  Relationship to patient: Self Contact Name: Jammie Troup Call Date: 12/10/19  Time: 1349 Phone: [phone] Outcome: No Answer/Busy Important Message mailed to: Patient address on file    Delorse Lek 12/10/2019, 1:49 PM

## 2019-12-10 NOTE — Plan of Care (Signed)
  Problem: Education: Goal: Knowledge of General Education information will improve Description: Including pain rating scale, medication(s)/side effects and non-pharmacologic comfort measures 12/10/2019 1612 by Madaline Brilliant, RN Outcome: Adequate for Discharge 12/10/2019 0842 by Madaline Brilliant, RN Outcome: Not Progressing   Problem: Health Behavior/Discharge Planning: Goal: Ability to manage health-related needs will improve 12/10/2019 1612 by Madaline Brilliant, RN Outcome: Adequate for Discharge 12/10/2019 0842 by Madaline Brilliant, RN Outcome: Not Progressing   Problem: Clinical Measurements: Goal: Ability to maintain clinical measurements within normal limits will improve 12/10/2019 1612 by Madaline Brilliant, RN Outcome: Adequate for Discharge 12/10/2019 0842 by Madaline Brilliant, RN Outcome: Not Progressing Goal: Will remain free from infection 12/10/2019 1612 by Madaline Brilliant, RN Outcome: Adequate for Discharge 12/10/2019 0842 by Madaline Brilliant, RN Outcome: Not Progressing Goal: Diagnostic test results will improve 12/10/2019 1612 by Madaline Brilliant, RN Outcome: Adequate for Discharge 12/10/2019 0842 by Madaline Brilliant, RN Outcome: Not Progressing Goal: Respiratory complications will improve 12/10/2019 1612 by Madaline Brilliant, RN Outcome: Adequate for Discharge 12/10/2019 0842 by Madaline Brilliant, RN Outcome: Not Progressing Goal: Cardiovascular complication will be avoided 12/10/2019 1612 by Madaline Brilliant, RN Outcome: Adequate for Discharge 12/10/2019 0842 by Madaline Brilliant, RN Outcome: Not Progressing   Problem: Activity: Goal: Risk for activity intolerance will decrease 12/10/2019 1612 by Madaline Brilliant, RN Outcome: Adequate for Discharge 12/10/2019 [phone] by Madaline Brilliant, RN Outcome: Not Progressing   Problem: Nutrition: Goal: Adequate nutrition will be maintained 12/10/2019 1612 by Madaline Brilliant, RN Outcome: Adequate for Discharge 12/10/2019 0842 by  Madaline Brilliant, RN Outcome: Not Progressing   Problem: Coping: Goal: Level of anxiety will decrease 12/10/2019 1612 by Madaline Brilliant, RN Outcome: Adequate for Discharge 12/10/2019 0842 by Madaline Brilliant, RN Outcome: Not Progressing   Problem: Elimination: Goal: Will not experience complications related to bowel motility 12/10/2019 1612 by Madaline Brilliant, RN Outcome: Adequate for Discharge 12/10/2019 0842 by Madaline Brilliant, RN Outcome: Not Progressing Goal: Will not experience complications related to urinary retention 12/10/2019 1612 by Madaline Brilliant, RN Outcome: Adequate for Discharge 12/10/2019 0842 by Madaline Brilliant, RN Outcome: Not Progressing   Problem: Pain Managment: Goal: General experience of comfort will improve 12/10/2019 1612 by Madaline Brilliant, RN Outcome: Adequate for Discharge 12/10/2019 0842 by Madaline Brilliant, RN Outcome: Not Progressing   Problem: Safety: Goal: Ability to remain free from injury will improve 12/10/2019 1612 by Madaline Brilliant, RN Outcome: Adequate for Discharge 12/10/2019 0842 by Madaline Brilliant, RN Outcome: Not Progressing   Problem: Skin Integrity: Goal: Risk for impaired skin integrity will decrease 12/10/2019 1612 by Madaline Brilliant, RN Outcome: Adequate for Discharge 12/10/2019 0842 by Madaline Brilliant, RN Outcome: Not Progressing

## 2019-12-10 NOTE — TOC Progression Note (Signed)
Transition of Care [facility]) - Progression Note    Patient Details  Name: Hannah Padilla MRN: [id number] Date of Birth: [birth year]  Transition of Care [facility]) CM/SW Briny Breezes, RN Phone Number: 12/10/2019, 10:17 AM  Clinical Narrative:    Case management spoke with the husband at the bedside this morning regarding Inpatient Hospice at [facility].  I called Farrel Gordon, RNCM with [facility] and she is communicating admission needs to [facility] to review the clinicals and then Farrel Gordon will reach out and speak with the patient's husband.  Will continue to follow the patient for possible admission to [facility] thereafter if approved and available bed for admission.  Expected Discharge Plan: [place] Barriers to Discharge: No Barriers Identified  Expected Discharge Plan and Services Expected Discharge Plan: [place] In-house Referral: Hospice / Palliative Care Discharge Planning Services: CM Consult Post Acute Care Choice: [place] Living arrangements for the past 2 months: Single Family Home                                       Social Determinants of Health (SDOH) Interventions    Readmission Risk Interventions Readmission Risk Prevention Plan 12/08/2019  Transportation Screening Complete  Medication Review Press photographer) Complete  PCP or Specialist appointment within 3-5 days of discharge Complete  HRI or Home Care Consult Complete  SW Recovery Care/Counseling Consult Complete  Palliative Care Screening Complete  Skilled Nursing Facility Complete  Some recent data might be hidden

## 2019-12-10 NOTE — Progress Notes (Signed)
The patient's husband is at the bedside. He is complimentary of the care given. Awaiting authorization from [facility] for Hospice care.

## 2019-12-10 NOTE — TOC Transition Note (Addendum)
Transition of Care [facility]) - CM/SW Discharge Note   Patient Details  Name: Hannah Padilla MRN: [id number] Date of Birth: Apr 06, 1950  Transition of Care [facility]) CM/SW Contact:  Curlene Labrum, RN Phone Number: 12/10/2019, 2:56 PM   Clinical Narrative:    Case management spoke with Trixie Dredge, CM with [facility] and they have an available bed for the patient today.  The facility is having the husband sign the consent forms for admission and as soon as I get confirmation from Trixie Dredge, I will arrange transportation with [facility] to the facility.  The husband will be called thereafter to inform him of patient's transportation plans and the primary RN, Caren Griffins will make sure the patient is comfortable for the transport to the facility as the patient is receiving scheduled Ativan from the physician.  [facility] packet is prepared and is at the bedside along with the DNR.  11/4- 1558-  [facility] called and transportation was arranged for 4:30 pm today.  I called and notified the patient's husband.  No other discharge needs at this time.   Final next level of care: Skilled Nursing Facility Barriers to Discharge: No Barriers Identified   Patient Goals and CMS Choice Patient states their goals for this hospitalization and ongoing recovery are:: Patient is only oriented to self - Patient's husband requesting SNf placement. CMS Medicare.gov Compare Post Acute Care list provided to:: Patient Choice offered to / list presented to : Spouse  Discharge Placement                       Discharge Plan and Services In-house Referral: Hospice / Palliative Care Discharge Planning Services: CM Consult Post Acute Care Choice: [place]                               Social Determinants of Health (SDOH) Interventions     Readmission Risk Interventions Readmission Risk Prevention Plan 12/08/2019  Transportation Screening Complete  Medication Review Human resources officer) Complete  PCP or Specialist appointment within 3-5 days of discharge Complete  HRI or Home Care Consult Complete  SW Recovery Care/Counseling Consult Complete  Palliative Care Screening Complete  Skilled Nursing Facility Complete  Some recent data might be hidden

## 2019-12-10 NOTE — Progress Notes (Signed)
This chaplain attempted F/U spiritual care with the Pt. Husband-Hannah Padilla.  Hannah Padilla is not bedside at the time of the visit.

## 2019-12-10 NOTE — Discharge Summary (Signed)
[facility] Discharge Summary   Patient: Hannah Padilla MIW:[id number]  PCP: Carlena Hurl, PA-C  Date of admission: 12/04/2019   Date of discharge:  12/10/2019     Discharge Diagnoses:  Principal Problem:   Failure to thrive in adult Active Problems:   GERD without esophagitis   E-coli UTI   Hyperthyroidism   Acute metabolic encephalopathy   Progressive dementia with uncertain etiology ([place])   Pressure injury of skin   Goals of care, counseling/discussion  Admitted From: home Disposition:   Residential hospice  Recommendations for Outpatient Follow-up:  1. PCP: establish care with hospice 2. Follow up LABS/TEST:  none   Diet recommendation: Regular diet  Activity: The patient is advised to gradually reintroduce usual activities, as tolerated  Discharge Condition: stable  Code Status: DNR   History of present illness: As per the H and P dictated on admission, "69 year old female with past medical history of hypothyroidism, recent diagnosis of [place] spotted fever (08/2019, S/P Tx) who presented on 9/14 with progressive generalized weakness, falls and confusion over the preceding 3 months.  Patient was hospitalized at [facility] from 10/20/2019 until 11/09/2019.  Patient was evaluated by multiple neurologists and underwent an extensive work-up for her progressive neurologic decline.  Extensive work-up up until the end of hospitalization included: Unremarkable TSH, unremarkable ammonia level, unremarkable B1 level, RMSF borderline positive IgG, negative IgM(she completed 10 day course of doxy in July), negative Lyme, normal ESR, normal thyroglobulin and thyroid peroxidase Ab, negative anti-hu, anti-yo, anti-ri.  Additionally, patient underwent lumbar puncture negative for oligoclonal bands, negative for paraneoplastic CSF panel (LGI1 was not included), negative for neuron-specific enolase, negative for serum anti-LGI 1, negative serum autoimmune encephalopathy  panel and negative 14-3-3eta.  With extensive work-up noted above, patient was diagnosed with rapidly progressive dementia of undetermined etiology.  Patient received a course of 5 days of IV steroids during the hospital course and was thought to respond to this.  Patient was also placed on a trial of Sinemet which she is currently on.  Patient was discharged from the inpatient service on 10/4 to inpatient rehab.  Patient strength and physical activity did gradually improve throughout rehab.  Patient seemed to decline somewhat in the last week of rehab and on 10/26 a urinalysis was done suggestive of urinary tract infection.  Patient was initially treated with intravenous Rocephin.  On 10/29, patient was to be discharged from rehab but it was determined that the urine culture grew out E. coli that was resistant to Rocephin.  Patient was therefore discharged on Bactrim.  According to the husband, upon arriving home (morning of 10/30) was extremely weak and agitated throughout the day.  Patient refused to take any oral intake whatsoever.  The husband states he was unable to get the Bactrim as a result.  The husband, exasperated that he could not provide for his wife brought the patient back to [facility] emergency department for evaluation.  Upon evaluation in the emergency department, initial labs were suggestive of severe dehydration.  Patient was provided with a 500 cc fluid bolus.  The hospitalist group was called to assess the patient for admission to the hospital."  Hospital Course:  Summary of her active problems in the hospital is as following. 1.  Acute on chronic metabolic encephalopathy Rapidly progressive dementia of undetermined etiology. ESBL E. coli UTI now with colonization Recently hospitalized for a prolonged stay due to rapidly worsening mentation concerning for progressive dementia of undetermined etiology.  After that patient went to IP rehab and after the patient went  home and came back to the hospital same day. Found to have ESBL UTI and treated with IV Zosyn for 5 days. Despite the treatment shows continuous decline with worsening mental status. For last 2 days patient has not been able to eat or drink anything with worsening potassium levels. Patient remains confused and severely lethargic.  But remains also emotionally labile when she is awake. Husband prefers to transition to comfort care for now. Extensive work-up as well as treatment done during last admission shows no improvement in her condition.  2.  Severe protein calorie malnutrition. Adult failure to thrive. Per husband patient had mention she would not like any life prolonging measures if she does not have any good quality of life. Husband would like to transition her to complete comfort. Husband's preference would be to maintain her in the hospital but he would be okay with transitioning to residential hospice. I explained the rationale for residential hospice and he currently agrees to that.  3.  Goals of care conversation. Extensive conversation on 12/09/2019 with husband at bedside. Husband brought up the point that patient has not had anything to eat or drink for last 48 hours.  Appears to be progressively worsening.  Recommend she should be DNR. He also recommends that she should be transition to complete comfort. He requested that we give her medication so that she does not have any sensation about starvation and I explained the end-of-life process and how we may not need any medication and patient will still feel comfortable. Husband had primary concerns regarding frequent panic attacks that the patient is having and would like medication and treatment for that.  4.  Hypokalemia.  Hypomagnesemia. Now comfort care  5.  History of [facility] spotted fever. Already treated in the past.  6.  Stage I bilateral buttocks pressure ulcer POA. Foam dressing.  7. Severe  malnutrition Placing the pt at risk of poor outcomes. Body mass index is 19.44 kg/m.  Nutrition Problem: Severe Malnutrition Etiology: chronic illness (progressive dementia) Nutrition Interventions: Interventions: Ensure Enlive (each supplement provides 350kcal and 20 grams of protein), Prostat, MVI, Magic cup  Pressure Injury 12/06/19 Buttocks Left;Right Stage 1 -  Intact skin with non-blanchable redness of a localized area usually over a bony prominence. (Active)  12/06/19 2000  Location: Buttocks  Location Orientation: Left;Right  Staging: Stage 1 -  Intact skin with non-blanchable redness of a localized area usually over a bony prominence.  Wound Description (Comments):   Present on Admission: Yes      On the day of the discharge the patient's vitals were stable, and no other acute medical condition were reported by patient. The patient was felt safe to be discharge at residential hospice.  Consultants: Palliative care  Procedures: none  Discharge Exam: General: Appear in no distress, no Rash;  Cardiovascular: S1 and S2 Present, no Murmur Respiratory: good respiratory effort, Bilateral Air entry present and CTA, no Crackles, no wheezes Abdomen: Bowel Sound present Neurology: lethargic and not oriented to time, place, and person affect unresponsive.   Filed Weights   12/04/19 1723  Weight: 56.3 kg   Vitals:   12/09/19 0300 12/09/19 1925  BP: 118/70 113/68  Pulse: 70 64  Resp: 15 14  Temp: 98.3 F (36.8 C) 99 F (37.2 C)  SpO2: 96% 100%    DISCHARGE MEDICATION: Allergies as of 12/10/2019      Reactions   Methimazole  Possible cause of transaminitis      Medication List    STOP taking these medications   carbidopa-levodopa 25-100 MG tablet Commonly known as: SINEMET IR   diazepam 5 MG tablet Commonly known as: Valium   melatonin 3 MG Tabs tablet   omeprazole 40 MG capsule Commonly known as: PRILOSEC   potassium chloride SA 20 MEQ tablet Commonly  known as: KLOR-CON   propranolol ER 60 MG 24 hr capsule Commonly known as: Inderal LA   sertraline 100 MG tablet Commonly known as: ZOLOFT   sulfamethoxazole-trimethoprim 800-160 MG tablet Commonly known as: BACTRIM DS   Vitamin D (Ergocalciferol) 1.25 MG (50000 UNIT) Caps capsule Commonly known as: DRISDOL     TAKE these medications   acetaminophen 325 MG tablet Commonly known as: TYLENOL Take 2 tablets (650 mg total) by mouth every 6 (six) hours as needed for mild pain (or Fever >/= 101).   LORazepam 2 MG/ML injection Commonly known as: ATIVAN Inject 0.25 mLs (0.5 mg total) into the vein every 4 (four) hours.   polyethylene glycol 17 g packet Commonly known as: MIRALAX / GLYCOLAX Take 17 g by mouth daily.      Allergies  Allergen Reactions  . Methimazole     Possible cause of transaminitis   Discharge Instructions    Diet general   Complete by: As directed    Increase activity slowly   Complete by: As directed    No wound care   Complete by: As directed       The results of significant diagnostics from this hospitalization (including imaging, microbiology, ancillary and laboratory) are listed below for reference.    Significant Diagnostic Studies: DG Chest Portable 1 View  Result Date: 12/04/2019 CLINICAL DATA:  Altered mental status. EXAM: PORTABLE CHEST 1 VIEW COMPARISON:  Chest CT 10/26/2019 FINDINGS: The cardiac silhouette, mediastinal and hilar contours are within normal limits and stable. The lungs are clear. No pleural effusions. No pulmonary lesions. The bony thorax is intact. IMPRESSION: No acute cardiopulmonary findings. Electronically Signed   By: Marijo Sanes M.D.   On: 12/04/2019 21:03    Microbiology: Recent Results (from the past 240 hour(s))  Culture, Urine     Status: Abnormal   Collection Time: 12/01/19  6:16 PM   Specimen: Urine, Clean Catch  Result Value Ref Range Status   Specimen Description URINE, CLEAN CATCH  Final   Special  Requests   Final    NONE Performed at [facility], [place] [address] [place], [place] [address]    Culture (A)  Final    >=100,000 COLONIES/mL ESCHERICHIA COLI Confirmed Extended Spectrum Beta-Lactamase Producer (ESBL).  In bloodstream infections from ESBL organisms, carbapenems are preferred over piperacillin/tazobactam. They are shown to have a lower risk of mortality.    Report Status 12/04/2019 FINAL  Final   Organism ID, Bacteria ESCHERICHIA COLI (A)  Final      Susceptibility   Escherichia coli - MIC*    AMPICILLIN >=32 RESISTANT Resistant     CEFAZOLIN >=64 RESISTANT Resistant     CEFTRIAXONE >=64 RESISTANT Resistant     CIPROFLOXACIN >=4 RESISTANT Resistant     GENTAMICIN <=1 SENSITIVE Sensitive     IMIPENEM <=0.25 SENSITIVE Sensitive     NITROFURANTOIN <=16 SENSITIVE Sensitive     TRIMETH/SULFA <=20 SENSITIVE Sensitive     AMPICILLIN/SULBACTAM >=32 RESISTANT Resistant     PIP/TAZO <=4 SENSITIVE Sensitive     * >=100,000 COLONIES/mL ESCHERICHIA COLI  Respiratory Panel  by RT PCR (Flu A&B, Covid) - Nasopharyngeal Swab     Status: None   Collection Time: 12/04/19 10:25 PM   Specimen: Nasopharyngeal Swab  Result Value Ref Range Status   SARS Coronavirus 2 by RT PCR NEGATIVE NEGATIVE Final    Comment: (NOTE) SARS-CoV-2 target nucleic acids are NOT DETECTED.  The SARS-CoV-2 RNA is generally detectable in upper respiratoy specimens during the acute phase of infection. The lowest concentration of SARS-CoV-2 viral copies this assay can detect is 131 copies/mL. A negative result does not preclude SARS-Cov-2 infection and should not be used as the sole basis for treatment or other patient management decisions. A negative result may occur with  improper specimen collection/handling, submission of specimen other than nasopharyngeal swab, presence of viral mutation(s) within the areas targeted by this assay, and inadequate number of viral copies (<131 copies/mL). A negative  result must be combined with clinical observations, patient history, and epidemiological information. The expected result is Negative.  Fact Sheet for Patients:  PinkCheek.be  Fact Sheet for Healthcare Providers:  GravelBags.it  This test is no t yet approved or cleared by the [place] FDA and  has been authorized for detection and/or diagnosis of SARS-CoV-2 by FDA under an Emergency Use Authorization (EUA). This EUA will remain  in effect (meaning this test can be used) for the duration of the COVID-19 declaration under Section 564(b)(1) of the Act, 21 U.S.C. section 360bbb-3(b)(1), unless the authorization is terminated or revoked sooner.     Influenza A by PCR NEGATIVE NEGATIVE Final   Influenza B by PCR NEGATIVE NEGATIVE Final    Comment: (NOTE) The Xpert Xpress SARS-CoV-2/FLU/RSV assay is intended as an aid in  the diagnosis of influenza from Nasopharyngeal swab specimens and  should not be used as a sole basis for treatment. Nasal washings and  aspirates are unacceptable for Xpert Xpress SARS-CoV-2/FLU/RSV  testing.  Fact Sheet for Patients: PinkCheek.be  Fact Sheet for Healthcare Providers: GravelBags.it  This test is not yet approved or cleared by the [place] FDA and  has been authorized for detection and/or diagnosis of SARS-CoV-2 by  FDA under an Emergency Use Authorization (EUA). This EUA will remain  in effect (meaning this test can be used) for the duration of the  Covid-19 declaration under Section 564(b)(1) of the Act, 21  U.S.C. section 360bbb-3(b)(1), unless the authorization is  terminated or revoked. Performed at [facility], [place] [address], [place], [place] [address]      Labs: CBC: Recent Labs  Lab 12/04/19 1750 12/05/19 0346 12/06/19 1826 12/07/19 0225 12/09/19 0113  WBC 10.8* 8.6 8.2 8.1 6.1  NEUTROABS 7.2  5.4  --   --   --   HGB 17.4* 14.3 14.4 14.3 13.6  HCT 51.9* 41.3 41.0 39.8 40.2  MCV 99.0 98.1 94.0 94.1 99.0  PLT 362 257 266 259 604   Basic Metabolic Panel: Recent Labs  Lab 12/05/19 0346 12/06/19 1826 12/07/19 0225 12/08/19 0329 12/09/19 0113  NA 138 140 140 143 139  K 3.5 3.2* 3.3* 3.2* 3.3*  CL 112* 111 114* 112* 113*  CO2 14* 16* 15* 23 18*  GLUCOSE 78 111* 112* 117* 114*  BUN 13 6* <5* 5* <5*  CREATININE 0.81 0.66 0.63 0.73 0.54  CALCIUM 9.3 9.1 8.8* 8.6* 8.7*  MG 2.1 2.0 1.9 2.4 2.1   Liver Function Tests: Recent Labs  Lab 12/04/19 1750 12/05/19 0346 12/06/19 1826 12/07/19 0225 12/09/19 0113  AST 26 18 19  21 12*  ALT 56* 10 13 13 6   ALKPHOS 108 77 77 66 60  BILITOT 1.3* 1.3* 1.2 1.7* 1.0  PROT 7.0 5.7* 5.4* 5.1* 5.0*  ALBUMIN 4.3 3.4* 3.3* 3.2* 2.8*   CBG: No results for input(s): GLUCAP in the last 168 hours.  Time spent: 35 minutes  Signed:  Berle Mull  [facility]  12/10/2019 2:44 PM

## 2019-12-15 ENCOUNTER — Telehealth: Payer: Self-pay | Admitting: Neurology

## 2019-12-15 NOTE — Telephone Encounter (Signed)
Pt's husband, Monice Lundy (on DPR) called, She is in Hospice at [facility]. Cancel all appts, she will not be coming home. Let Dr. Jannifer Franklin know I would like the results of her Cerebral Spinal test. Would like a call from the nurse.

## 2019-12-15 NOTE — Telephone Encounter (Signed)
I called the husband, left a message, all of the blood work and spinal fluid analysis done in the hospital and done at our office came back negative, the actual etiology of the rapidly progressive dementia was never discernible.

## 2019-12-16 ENCOUNTER — Encounter: Payer: Medicare Other | Admitting: Registered Nurse

## 2019-12-16 DIAGNOSIS — Z515 Encounter for palliative care: Secondary | ICD-10-CM

## 2019-12-16 DIAGNOSIS — Z7189 Other specified counseling: Secondary | ICD-10-CM

## 2019-12-17 ENCOUNTER — Ambulatory Visit: Payer: Medicare Other | Admitting: Neurology

## 2019-12-21 ENCOUNTER — Ambulatory Visit: Payer: Medicare Other | Admitting: Internal Medicine

## 2019-12-22 NOTE — Telephone Encounter (Signed)
Lvm for pt to call back and schedule a wellness visit . [place]

## 2021-10-11 ENCOUNTER — Encounter: Payer: Self-pay | Admitting: Internal Medicine

## 2021-11-14 ENCOUNTER — Encounter: Payer: Self-pay | Admitting: Internal Medicine

## 2021-12-17 IMAGING — MR MR HEAD W/O CM
11 series · 48 of 48 positions shown · non-contrast
Comparison: None.

CLINICAL DATA: Encephalopathy

EXAM:
MRI HEAD WITHOUT CONTRAST
TECHNIQUE: Multiplanar, multiecho pulse sequences of the brain and surrounding
structures were obtained without intravenous contrast.

[Series 5: DWI · axial · 3.0mm · 1.36mm/px · z∈[-49,+104]mm · 7 of 104 slices shown (1 of 4)]
[im 1/104]
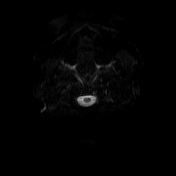
[im 18/104]
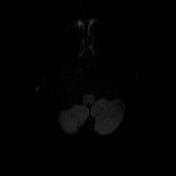
[im 35/104]
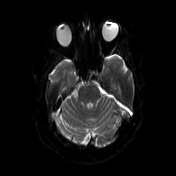
[im 52/104]
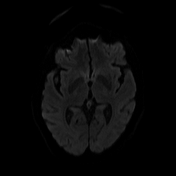
[im 69/104]
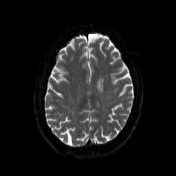
[im 86/104]
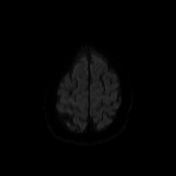
[im 104/104]
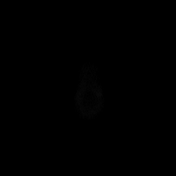

[Series 6: DWI · axial · 3.0mm · 1.36mm/px · z∈[-49,+104]mm · 3 of 52 slices shown (2 of 4)]
[im 1/52]
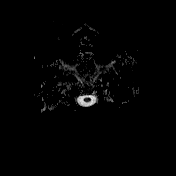
[im 26/52]
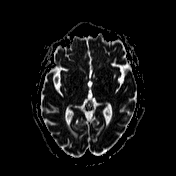
[im 52/52]
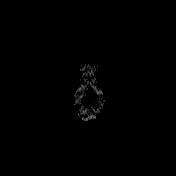

[Series 7: T1 · sagittal · 5.0mm · 0.75mm/px · 2 of 24 slices shown (1 of 2)]
[im 1/24]
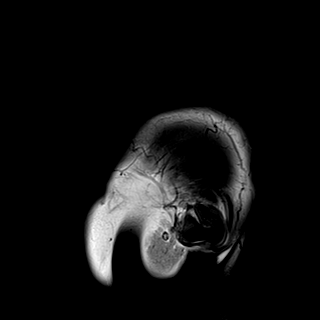
[im 24/24]
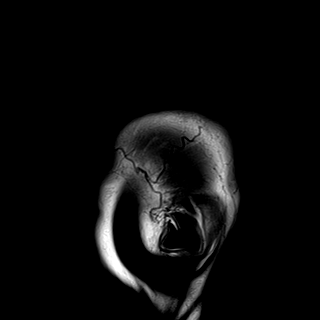

[Series 8: T2 · axial · 5.0mm · 0.62mm/px · z∈[-49,+101]mm · 2 of 24 slices shown (1 of 2)]
[im 1/24]
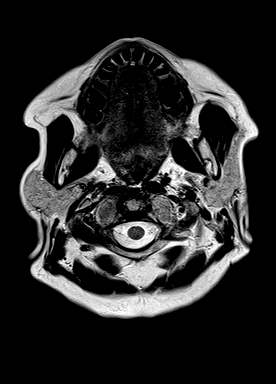
[im 24/24]
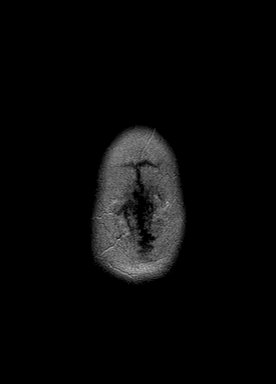

[Series 9: mip_images(sw) · axial · 24.0mm · 0.75mm/px · z∈[-40,+92]mm · 3 of 45 slices shown]
[im 1/45]
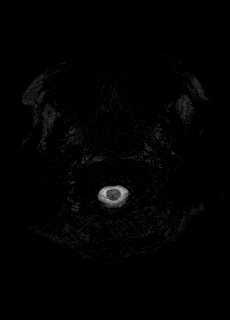
[im 23/45]
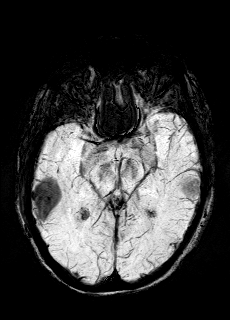
[im 45/45]
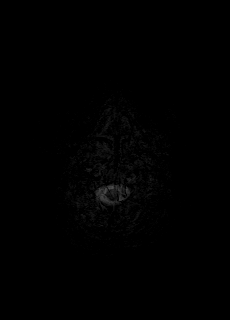

[Series 10: swi_images · axial · 3.0mm · 0.75mm/px · z∈[-50,+103]mm · 4 of 52 slices shown]
[im 1/52]
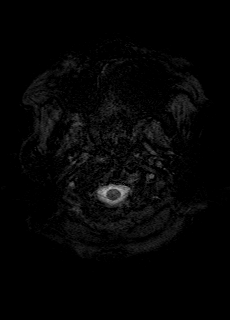
[im 18/52]
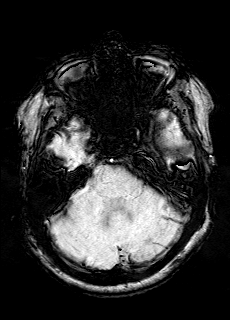
[im 35/52]
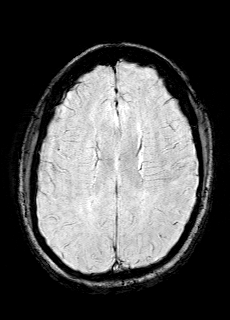
[im 52/52]
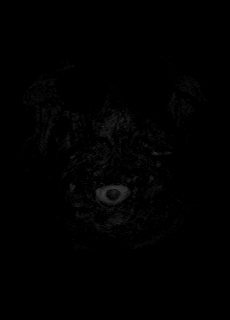

[Series 11: FLAIR · axial · 3.0mm · 0.75mm/px · z∈[-50,+103]mm · 4 of 52 slices shown]
[im 1/52]
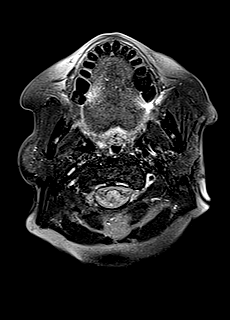
[im 18/52]
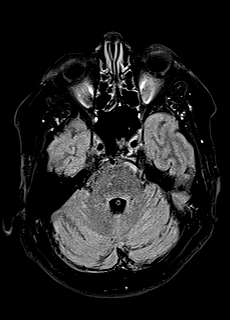
[im 35/52]
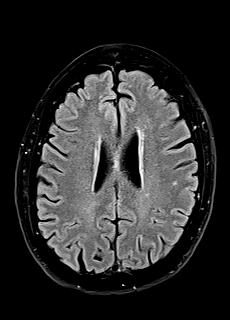
[im 52/52]
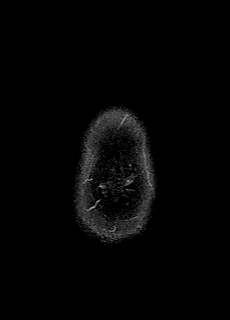

[Series 12: T1 · axial · 1.0mm · 0.94mm/px · z∈[-55,+104]mm · 12 of 160 slices shown (2 of 2)]
[im 1/160]
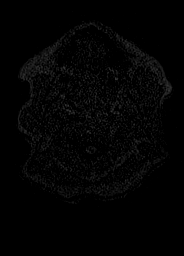
[im 15/160]
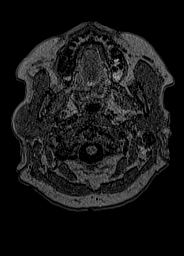
[im 29/160]
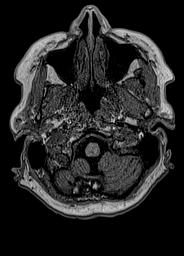
[im 44/160]
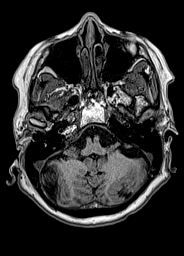
[im 58/160]
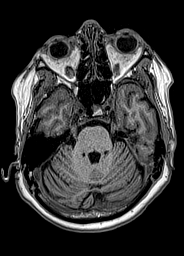
[im 73/160]
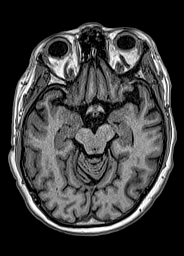
[im 87/160]
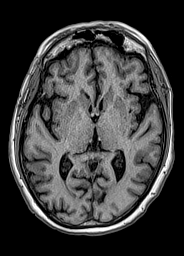
[im 102/160]
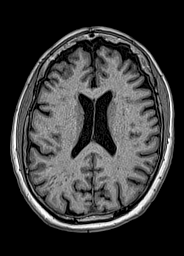
[im 116/160]
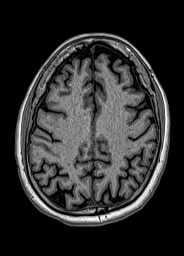
[im 131/160]
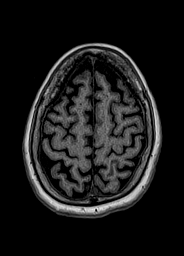
[im 145/160]
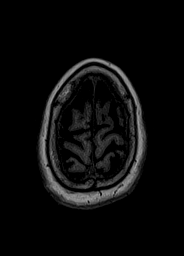
[im 160/160]
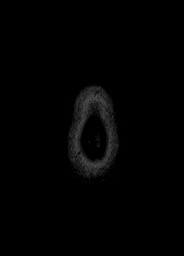

[Series 13: DWI · coronal · 5.0mm · 1.31mm/px · 5 of 72 slices shown (3 of 4)]
[im 1/72]
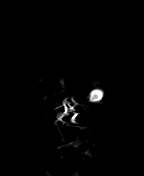
[im 18/72]
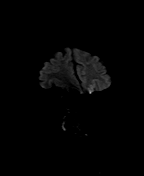
[im 36/72]
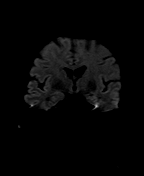
[im 54/72]
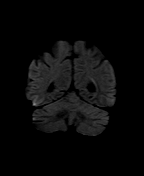
[im 72/72]
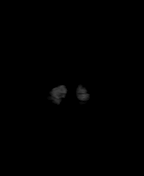

[Series 14: DWI · coronal · 5.0mm · 1.31mm/px · 3 of 36 slices shown (4 of 4)]
[im 1/36]
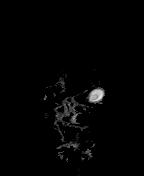
[im 18/36]
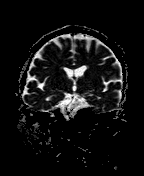
[im 36/36]
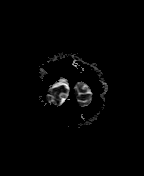

[Series 15: T2 · coronal · 5.0mm · 0.57mm/px · 3 of 36 slices shown (2 of 2)]
[im 1/36]
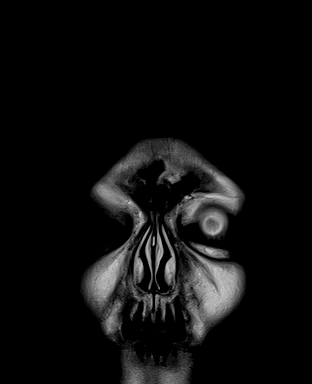
[im 18/36]
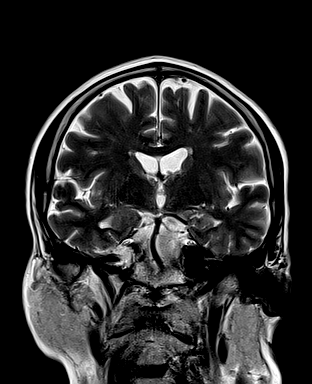
[im 36/36]
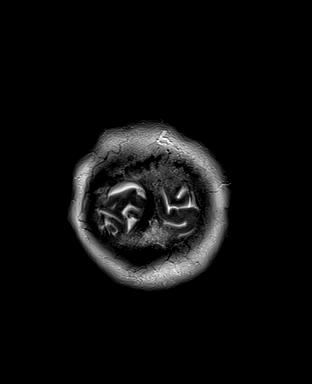

[48 of 48 positions shown; findings below may reference images not displayed]

FINDINGS: Brain: There is no acute infarction or intracranial hemorrhage.
There is no intracranial mass, mass effect, or edema. There is no
hydrocephalus or extra-axial fluid collection. Ventricles and sulci
are normal in size and configuration. Patchy foci of T2
hyperintensity in the supratentorial white matter are nonspecific
but may reflect mild chronic microvascular ischemic changes.

Vascular: Major vessel flow voids at the skull base are preserved.

Skull and upper cervical spine: Normal marrow signal is preserved.

Sinuses/Orbits: Trace paranasal sinus mucosal thickening. Orbits are
unremarkable.

Other: Sella is unremarkable.  Mastoid air cells are clear.
IMPRESSION: No evidence of recent infarction, hemorrhage, or mass. Mild chronic
microvascular ischemic changes.
